# Patient Record
Sex: Female | Born: 1970
Health system: Southern US, Community
[De-identification: ages and names within clinical notes are randomized; demographics above are authoritative.]

## PROBLEM LIST (undated history)

## (undated) DIAGNOSIS — F419 Anxiety disorder, unspecified: Secondary | ICD-10-CM

## (undated) DIAGNOSIS — L509 Urticaria, unspecified: Secondary | ICD-10-CM

## (undated) DIAGNOSIS — J309 Allergic rhinitis, unspecified: Secondary | ICD-10-CM

## (undated) DIAGNOSIS — F32A Depression, unspecified: Secondary | ICD-10-CM

## (undated) DIAGNOSIS — Z85828 Personal history of other malignant neoplasm of skin: Secondary | ICD-10-CM

## (undated) DIAGNOSIS — F329 Major depressive disorder, single episode, unspecified: Secondary | ICD-10-CM

## (undated) DIAGNOSIS — Z8669 Personal history of other diseases of the nervous system and sense organs: Secondary | ICD-10-CM

## (undated) HISTORY — DX: Allergic rhinitis, unspecified: J30.9

## (undated) HISTORY — DX: Personal history of other diseases of the nervous system and sense organs: Z86.69

## (undated) HISTORY — DX: Depression, unspecified: F32.A

## (undated) HISTORY — DX: Personal history of other malignant neoplasm of skin: Z85.828

## (undated) HISTORY — PX: OTHER SURGICAL HISTORY: SHX169

## (undated) HISTORY — PX: TOTAL ABDOMINAL HYSTERECTOMY W/ BILATERAL SALPINGOOPHORECTOMY: SHX83

## (undated) HISTORY — DX: Major depressive disorder, single episode, unspecified: F32.9

## (undated) HISTORY — DX: Urticaria, unspecified: L50.9

## (undated) HISTORY — DX: Anxiety disorder, unspecified: F41.9

## (undated) HISTORY — PX: UMBILICAL HERNIA REPAIR: SUR1181

---

## 2005-04-18 ENCOUNTER — Ambulatory Visit: Payer: Self-pay | Admitting: Obstetrics and Gynecology

## 2005-06-03 ENCOUNTER — Ambulatory Visit: Payer: Self-pay | Admitting: Obstetrics and Gynecology

## 2005-10-22 ENCOUNTER — Ambulatory Visit: Payer: Self-pay | Admitting: Obstetrics and Gynecology

## 2005-12-03 ENCOUNTER — Ambulatory Visit: Payer: Self-pay | Admitting: Obstetrics and Gynecology

## 2006-01-03 ENCOUNTER — Inpatient Hospital Stay: Payer: Self-pay | Admitting: Obstetrics and Gynecology

## 2006-01-06 ENCOUNTER — Ambulatory Visit: Payer: Self-pay | Admitting: Pediatrics

## 2006-04-07 HISTORY — PX: OTHER SURGICAL HISTORY: SHX169

## 2007-03-23 ENCOUNTER — Ambulatory Visit: Payer: Self-pay | Admitting: Surgery

## 2007-03-29 ENCOUNTER — Ambulatory Visit: Payer: Self-pay | Admitting: Surgery

## 2007-04-06 ENCOUNTER — Ambulatory Visit: Payer: Self-pay | Admitting: Surgery

## 2007-04-08 ENCOUNTER — Ambulatory Visit: Payer: Self-pay | Admitting: Internal Medicine

## 2007-04-28 ENCOUNTER — Ambulatory Visit: Payer: Self-pay | Admitting: Internal Medicine

## 2007-05-04 ENCOUNTER — Ambulatory Visit: Payer: Self-pay | Admitting: Internal Medicine

## 2007-05-09 ENCOUNTER — Ambulatory Visit: Payer: Self-pay | Admitting: Internal Medicine

## 2007-06-04 ENCOUNTER — Ambulatory Visit: Payer: Self-pay | Admitting: Surgery

## 2007-06-06 ENCOUNTER — Ambulatory Visit: Payer: Self-pay | Admitting: Internal Medicine

## 2007-07-08 ENCOUNTER — Emergency Department: Payer: Self-pay | Admitting: Emergency Medicine

## 2007-09-06 ENCOUNTER — Ambulatory Visit: Payer: Self-pay | Admitting: Internal Medicine

## 2007-11-02 ENCOUNTER — Ambulatory Visit: Payer: Self-pay

## 2008-05-30 ENCOUNTER — Inpatient Hospital Stay: Payer: Self-pay | Admitting: Internal Medicine

## 2008-08-07 ENCOUNTER — Emergency Department: Payer: Self-pay | Admitting: Emergency Medicine

## 2008-08-29 ENCOUNTER — Ambulatory Visit: Payer: Self-pay | Admitting: Surgery

## 2008-09-06 ENCOUNTER — Ambulatory Visit: Payer: Self-pay | Admitting: Surgery

## 2009-10-25 ENCOUNTER — Ambulatory Visit: Payer: Self-pay | Admitting: Family

## 2010-08-13 ENCOUNTER — Observation Stay: Payer: Self-pay | Admitting: Internal Medicine

## 2010-08-23 ENCOUNTER — Emergency Department: Payer: Self-pay | Admitting: Emergency Medicine

## 2010-09-07 ENCOUNTER — Emergency Department: Payer: Self-pay | Admitting: Emergency Medicine

## 2011-04-08 HISTORY — PX: BREAST CYST ASPIRATION: SHX578

## 2011-05-21 ENCOUNTER — Emergency Department: Payer: Self-pay | Admitting: Emergency Medicine

## 2011-05-21 LAB — CBC
HCT: 37.6 % (ref 35.0–47.0)
MCH: 30.1 pg (ref 26.0–34.0)
MCHC: 33.8 g/dL (ref 32.0–36.0)
RDW: 12.8 % (ref 11.5–14.5)

## 2011-05-21 LAB — BASIC METABOLIC PANEL
BUN: 18 mg/dL (ref 7–18)
Calcium, Total: 8.6 mg/dL (ref 8.5–10.1)
Chloride: 105 mmol/L (ref 98–107)
Co2: 23 mmol/L (ref 21–32)
EGFR (Non-African Amer.): >60
Potassium: 3.5 mmol/L (ref 3.5–5.1)

## 2011-08-04 ENCOUNTER — Other Ambulatory Visit: Payer: Self-pay | Admitting: Physician Assistant

## 2011-08-04 LAB — COMPREHENSIVE METABOLIC PANEL
Albumin: 3.6 g/dL (ref 3.4–5.0)
Alkaline Phosphatase: 47 U/L — ABNORMAL LOW (ref 50–136)
Bilirubin,Total: 0.5 mg/dL (ref 0.2–1.0)
Calcium, Total: 8.6 mg/dL (ref 8.5–10.1)
Chloride: 107 mmol/L (ref 98–107)
Co2: 28 mmol/L (ref 21–32)
Creatinine: 0.67 mg/dL (ref 0.60–1.30)
EGFR (African American): >60
Glucose: 85 mg/dL (ref 65–99)
Osmolality: 281 (ref 275–301)
Potassium: 3.6 mmol/L (ref 3.5–5.1)
SGPT (ALT): 22 U/L
Total Protein: 7.5 g/dL (ref 6.4–8.2)

## 2011-08-04 LAB — CBC WITH DIFFERENTIAL/PLATELET
Basophil #: 0 10*3/uL (ref 0.0–0.1)
Basophil %: 0.7 %
HCT: 39.6 % (ref 35.0–47.0)
HGB: 13 g/dL (ref 12.0–16.0)
Lymphocyte #: 2 10*3/uL (ref 1.0–3.6)
Lymphocyte %: 37.4 %
MCHC: 32.7 g/dL (ref 32.0–36.0)
Monocyte #: 0.3 x10 3/mm (ref 0.2–0.9)
Monocyte %: 6 %
Neutrophil #: 2.7 10*3/uL (ref 1.4–6.5)
Neutrophil %: 50.3 %
Platelet: 185 10*3/uL (ref 150–440)
RDW: 13.4 % (ref 11.5–14.5)
WBC: 5.4 10*3/uL (ref 3.6–11.0)

## 2011-08-04 LAB — TSH: Thyroid Stimulating Horm: 3.19 u[IU]/mL

## 2012-02-12 ENCOUNTER — Ambulatory Visit: Payer: Self-pay

## 2012-02-18 ENCOUNTER — Emergency Department: Payer: Self-pay | Admitting: Emergency Medicine

## 2012-03-23 ENCOUNTER — Ambulatory Visit: Payer: Self-pay | Admitting: Primary Care

## 2012-04-07 HISTORY — PX: TOTAL ABDOMINAL HYSTERECTOMY W/ BILATERAL SALPINGOOPHORECTOMY: SHX83

## 2012-04-07 HISTORY — PX: ABDOMINAL HYSTERECTOMY: SHX81

## 2012-04-07 HISTORY — PX: HERNIA REPAIR: SHX51

## 2012-10-12 ENCOUNTER — Ambulatory Visit: Payer: Self-pay | Admitting: Surgery

## 2012-10-12 LAB — BASIC METABOLIC PANEL
Anion Gap: 5 — ABNORMAL LOW (ref 7–16)
Calcium, Total: 9.3 mg/dL (ref 8.5–10.1)
Chloride: 107 mmol/L (ref 98–107)
Co2: 29 mmol/L (ref 21–32)
Creatinine: 0.62 mg/dL (ref 0.60–1.30)
EGFR (African American): >60
Osmolality: 280 (ref 275–301)
Potassium: 3.9 mmol/L (ref 3.5–5.1)
Sodium: 141 mmol/L (ref 136–145)

## 2012-10-12 LAB — PREGNANCY, URINE: Pregnancy Test, Urine: NEGATIVE m[IU]/mL

## 2012-10-18 ENCOUNTER — Ambulatory Visit: Payer: Self-pay | Admitting: Obstetrics and Gynecology

## 2012-10-19 LAB — BASIC METABOLIC PANEL
Anion Gap: 5 — ABNORMAL LOW (ref 7–16)
BUN: 9 mg/dL (ref 7–18)
Calcium, Total: 8.1 mg/dL — ABNORMAL LOW (ref 8.5–10.1)
Co2: 27 mmol/L (ref 21–32)
Creatinine: 0.56 mg/dL — ABNORMAL LOW (ref 0.60–1.30)
EGFR (African American): >60
Glucose: 110 mg/dL — ABNORMAL HIGH (ref 65–99)
Osmolality: 271 (ref 275–301)
Potassium: 3.6 mmol/L (ref 3.5–5.1)
Sodium: 136 mmol/L (ref 136–145)

## 2013-08-22 ENCOUNTER — Emergency Department: Payer: Self-pay | Admitting: Emergency Medicine

## 2013-08-22 LAB — CK TOTAL AND CKMB (NOT AT ARMC): CK, Total: 122 U/L

## 2013-08-22 LAB — COMPREHENSIVE METABOLIC PANEL
ALK PHOS: 52 U/L
ALT: 64 U/L (ref 12–78)
Albumin: 4.3 g/dL (ref 3.4–5.0)
Anion Gap: 8 (ref 7–16)
BUN: 6 mg/dL — ABNORMAL LOW (ref 7–18)
Bilirubin,Total: 0.7 mg/dL (ref 0.2–1.0)
CHLORIDE: 106 mmol/L (ref 98–107)
Calcium, Total: 9.5 mg/dL (ref 8.5–10.1)
Co2: 24 mmol/L (ref 21–32)
Creatinine: 0.62 mg/dL (ref 0.60–1.30)
Glucose: 97 mg/dL (ref 65–99)
OSMOLALITY: 273 (ref 275–301)
Potassium: 3.3 mmol/L — ABNORMAL LOW (ref 3.5–5.1)
SGOT(AST): 25 U/L (ref 15–37)
Sodium: 138 mmol/L (ref 136–145)
Total Protein: 8.8 g/dL — ABNORMAL HIGH (ref 6.4–8.2)

## 2013-08-22 LAB — CBC
HCT: 45.6 % (ref 35.0–47.0)
HGB: 15.1 g/dL (ref 12.0–16.0)
MCH: 30.2 pg (ref 26.0–34.0)
MCHC: 33.2 g/dL (ref 32.0–36.0)
MCV: 91 fL (ref 80–100)
Platelet: 224 10*3/uL (ref 150–440)
RBC: 5.01 10*6/uL (ref 3.80–5.20)
RDW: 12.7 % (ref 11.5–14.5)
WBC: 6 10*3/uL (ref 3.6–11.0)

## 2013-08-22 LAB — TROPONIN I: Troponin-I: <0.02 ng/mL

## 2014-05-22 ENCOUNTER — Ambulatory Visit: Payer: Self-pay | Admitting: Obstetrics and Gynecology

## 2014-07-28 NOTE — Op Note (Signed)
PATIENT NAME:  Karen Walker, Karen Walker MR#:  [id number] DATE OF BIRTH:  [birth year]  DATE OF PROCEDURE:  10/18/2012  PREOPERATIVE DIAGNOSIS: Umbilical hernia.   POSTOPERATIVE DIAGNOSIS: Umbilical hernia.   PROCEDURE: Umbilical hernia repair.   SURGEON: Rochel Brome, MD  ANESTHESIA: General.   INDICATIONS: This 44 year old female has history of bulging at the umbilicus, has had some history of mild pain at the umbilicus, and a small umbilical hernia was demonstrated on physical exam which appeared to be the likely cause of the pain. She also had plans for laparoscopic-assisted vaginal hysterectomy, which was performer Dr. Ouida Sills.   DESCRIPTION OF PROCEDURE: With the patient on the operating table, in the lithotomy position, under general anesthesia, after completion of her hysterectomy, the incision remained open in the infraumbilical area. This was at the site of an old scar and was oriented transversely. This incision was lengthened by about 4 mm on the left end.  Dissection was carried down with blunt and sharp dissection exposing an umbilical hernia with herniated properitoneal fat. There were some lobulations in the herniated properitoneal fat, which was dissected free from surrounding structures and dissected down to the fascial ring defect. The herniated properitoneal fat was dissected free from the fascial ring defect and was reduced. The fascial ring defect was approximately 8 to 9 mm in dimension. This was repaired with a transversely oriented suture line of interrupted 0 Surgilon figure-of-eight sutures. Following this I could see hemostasis was intact. The portion of the skin of the umbilicus remained tethered to the fascia and an additional 5-0 Monocryl suture was used to tether the inferior aspect of the dermis to the deep fascia. Next, the skin was closed with running 5-0 Monocryl, subcuticular suture, and Dermabond. It is also noted that the 2 laparoscopic port sites and the bilateral groins  were also treated with Dermabond.   The patient appeared to be in satisfactory condition and was then prepared for transfer to the recovery room. ____________________________ Lenna Sciara. Rochel Brome, MD jws:sb D: 10/18/2012 10:01:17 ET T: 10/18/2012 10:35:33 ET JOB#: [id number]  cc: Loreli Dollar, MD, <Dictator> Loreli Dollar MD ELECTRONICALLY SIGNED 10/19/2012 18:00

## 2014-07-28 NOTE — Op Note (Signed)
PATIENT NAME:  Karen Walker, Karen Walker MR#:  [id number] DATE OF BIRTH:  1970/11/10  DATE OF PROCEDURE:  10/18/2012  PREOPERATIVE DIAGNOSES:  1.  Chronic pelvic pain.  2.  History of endometriosis.  3.  Umbilical hernia.  POSTOPERATIVE DIAGNOSES: 1.  Chronic pelvic pain.  2.  History of endometriosis.  3.  Umbilical hernia.  PROCEDURE:  1.  Laparoscopic-assisted vaginal hysterectomy and left salpingo-oophorectomy.  2.  Umbilical hernia repair. Please reference Dr. Lavone Neri Smith's operative report.   SURGEON: Boykin Nearing, M.D.   FIRST ASSISTANT: Amalia Hailey.   ANESTHESIA: General endotracheal anesthesia.   INDICATIONS: This is a 44 year old female with chronic pelvic pain and dyspareunia. The patient is known to have endometriosis as documented on previous laparoscopy. The patient is also noted to have umbilical hernia.   PROCEDURE: After adequate general endotracheal anesthesia, the patient was placed in the dorsal supine position with legs in the Wilkinson stirrups. The patient's abdomen, perineum, and vagina were prepped in a  normal sterile fashion. The patient did receive 2 grams IV cefoxitin prior to commencement of the case. The patient's bladder was catheterized with a Foley catheter yielding clear urine and double-tooth tenaculum applied on the cervix to be used for uterine manipulation during the procedure. Gloves were changed. A 12 mm infraumbilical incision was made, followed by placement of the laparoscope into the abdominal cavity under direct visualization with the Optiview cannula. The patient's abdomen was insufflated with carbon dioxide. A second 11 mm port was placed in the left lower quadrant under direct visualization. Trocar was advanced into the abdominal cavity. A third port site, 5 mm, was advanced into the abdominal cavity in the right lower quadrant under direct visualization. The left round ligament was transected with the Harmonic scalpel followed by cauterization of the left  infundibulopelvic ligament followed by removal of the left tube and ovary with the Harmonic scalpel. The broad ligament was clamped and transected with the Harmonic scalpel and dissection continued to the left uterine artery, which was cauterized and transected with the Harmonic scalpel. The patient's right round ligament was transected with the Harmonic scalpel and the broad ligament was sequentially opened. The right uterine artery was identified, cauterized, and transected with the Harmonic scalpel. Normal ureteral function was noted to this point.   The procedure was then converted to the vaginal approach where the cervix was circumferentially injected with Pitressin. A direct posterior colpotomy incision was made. Upon entry into the posterior cul-de-sac, the uterosacral ligaments were bilaterally clamped, transected, and suture ligated with 0 Vicryl suture. Circumferential incision was made with the Bovie and the anterior cul-de-sac was entered without difficulty. Two separate cardinal ligament clamps were placed with transection and suture ligated with 0 Vicryl suture. The uterus and left tube and ovary were delivered without difficulty. The peritoneum was then closed with a 2-0 PDS suture in a pursestring fashion and the vaginal vault was then closed with 0 Vicryl suture in a running nonlocking suture. Good hemostasis was noted. The patient's abdomen was then re-insufflated with CO2. Good hemostasis noted from within. Normal peristaltic activity of the ureters were noted. The lower port sites were then both closed with 2-0 and 4-0 Vicryl suture. Dr. Rochel Brome then commenced with the umbilical hernia repair. Please reference his operative report.   ESTIMATED BLOOD LOSS: 100 mL.    INTRAOPERATIVE FLUIDS: 1000 mL.   PATIENT NAME: Karen Walker thank you   ____________________________ Boykin Nearing, MD tjs:aw D: 10/18/2012 09:37:33 ET T: 10/18/2012 10:02:32 ET  JOB#: [id number]  cc: Boykin Nearing, MD, <Dictator> Boykin Nearing MD ELECTRONICALLY SIGNED 10/18/2012 10:37

## 2014-08-04 ENCOUNTER — Ambulatory Visit
Admit: 2014-08-04 | Disposition: A | Discharge status: Home / Self Care | Payer: Self-pay | Attending: Family Medicine | Admitting: Family Medicine

## 2014-08-11 ENCOUNTER — Ambulatory Visit: Payer: 59 | Admitting: Physical Therapy

## 2014-08-11 ENCOUNTER — Encounter: Payer: Self-pay | Admitting: Physical Therapy

## 2014-08-11 DIAGNOSIS — R293 Abnormal posture: Secondary | ICD-10-CM | POA: Insufficient documentation

## 2014-08-11 DIAGNOSIS — M629 Disorder of muscle, unspecified: Secondary | ICD-10-CM

## 2014-08-11 DIAGNOSIS — R278 Other lack of coordination: Secondary | ICD-10-CM

## 2014-08-11 DIAGNOSIS — M549 Dorsalgia, unspecified: Secondary | ICD-10-CM | POA: Insufficient documentation

## 2014-08-12 NOTE — Therapy (Deleted)
[place] MAIN [facility] SERVICES [address] [place], [place], [address] Phone: [phone]   Fax:  [phone]  Physical Therapy Evaluation  Patient Details  Name: Karen Walker MRN: [id number] Date of Birth: [birth year] Referring Provider:  Arnetha Courser, MD  Encounter Date: 08/11/2014      PT End of Session - 08/16/14 0834    Visit Number 2   Number of Visits 12   Date for PT Re-Evaluation 11/03/14   PT Start Time 3009   PT Stop Time 1730   PT Time Calculation (min) 53 min   Activity Tolerance Patient tolerated treatment well;No increased pain   Behavior During Therapy [facility] for tasks assessed/performed      Past Medical History  Diagnosis Date  . Anxiety   . Depression   . Allergy   . Hx of migraine headaches     light sensivity, unilateral HA    Past Surgical History  Procedure Laterality Date  . Melonoma removal on r medial arch of foot    . Hysterectemy    . Umbilical hernia repair      occurred with hysterectemy  . Hernia repair    . Abdominal hysterectomy      There were no vitals filed for this visit.  Visit Diagnosis:  Coordination abnormal - Plan: PT plan of care cert/re-cert,  Posture abnormality - Plan: PT plan of care cert/re-cert,   Fascial defect - Plan: PT plan of care cert/re-cert,      Subjective Assessment - 08/16/14 0825    Subjective Pt reported radiating pain from her LBP has resolved since the last session. Pt now states she experiences R upper trap pain.    Patient is accompained by: Family member  son   Pertinent History surgery for CA melanoma on medial arch R LE 2008, hysterectemy and  umbilical hernia repair 2330   Limitations Other (comment);Lifting   Patient Stated Goals decrease pain   Currently in Pain? Yes   Pain Score 9    Pain Location Neck   Pain Orientation Right   Pain Descriptors / Indicators Aching   Pain Onset Yesterday            Treatment: Manual Tx: gapping,  rotational mob in L sidelying at L1-2 Grade III. Centralized radicular Sx from LE but caused radiating pain upward to medial scap on R. Applied sustained pressure along paraspinals, STM medial scap mm, and MWM with open book 15 reps to R. Pt reported centralization of Sx post-Tx and was able to perform sidebend [facility] without pain.                    PT Education - 08/16/14 [phone]    Education provided Yes   Education Details HEP and posture   Person(s) Educated Patient   Methods Explanation;Demonstration;Tactile cues;Verbal cues;Handout   Comprehension Verbalized understanding;Returned demonstration;Verbal cues required;Tactile cues required          PT Short Term Goals - 08/12/14 1027    PT SHORT TERM GOAL #1   Title Pt will be IND with abdominal massage to decrease scar restrictions and progress to deep core strengthening HEP.    Time 6   Period Weeks   Status New   PT SHORT TERM GOAL #2   Title Pt will be IND with ROM HEP and strengthening HEP in order to decrease pain to sleep and work.    Time 6   Period Weeks   Status  New           PT Long Term Goals - 08/11/14 1136    PT LONG TERM GOAL #1   Title Pt wlll report pain starting after 3pm of working in order to show improved self-management and use of proper body mechanics and perform work duties.   Time 12   Period Weeks   Status New   PT LONG TERM GOAL #2   Title Pt will report sleeping with <2x of interruptions by pain in order to improve sleep.   Time 12   Period Weeks   Status New   PT LONG TERM GOAL #3   Title Pt will score a decrease in ODI  from 36% to < 20% in order to return to ADLs and work duties without pain.    Time 12   Period Weeks   Status New               Plan - 08/16/14 0916    Clinical Impression Statement Pt is a 44 yo female with LBP that radiates to RLE and neck.    Pt will benefit from skilled therapeutic intervention in order to improve on the following deficits  Impaired flexibility;Improper body mechanics;Decreased range of motion;Pain;Decreased activity tolerance;Increased fascial restricitons;Increased muscle spasms;Decreased scar mobility;Decreased endurance;Decreased safety awareness;Decreased mobility;Decreased strength;Decreased knowledge of precautions;Decreased coordination   Rehab Potential Good   PT Frequency 2x / week   PT Treatment/Interventions ADLs/Self Care Home Management;Moist Heat;Therapeutic activities;Patient/family education;Scar mobilization;Aquatic Therapy;Therapeutic exercise;Passive range of motion;Balance training;Manual techniques;Dry needling;Cryotherapy;Stair training;Neuromuscular re-education;Energy conservation;Functional mobility training   PT Next Visit Plan reassess Sx, ROM and assess muscle flexibilty in posterior back   Consulted and Agree with Plan of Care Patient         Problem List There are no active problems to display for this patient.   [name] ,PT, DPT, E-RYT  08/16/2014, 9:17 AM  [place] MAIN [facility] SERVICES [address] [place], [place], [address] Phone: [phone]   Fax:  [phone]

## 2014-08-12 NOTE — Patient Instructions (Addendum)
__Handout on open book only to R (15x) reps 2x day. FOr open book and side sleeping: Pillow folded under head for sidelying to decrease neck spasms. Pillow between knees for alignment. Practice log rolling technique, proper body mechanics at work (twist body by moving feet first, pushing cart with COM close to cart w/ decreased spinal flexion). __donning SIJ belt and wearing at work.

## 2014-08-14 ENCOUNTER — Ambulatory Visit: Payer: Self-pay | Admitting: Physical Therapy

## 2014-08-15 ENCOUNTER — Ambulatory Visit: Payer: 59 | Admitting: Physical Therapy

## 2014-08-15 DIAGNOSIS — R278 Other lack of coordination: Secondary | ICD-10-CM

## 2014-08-15 DIAGNOSIS — M549 Dorsalgia, unspecified: Secondary | ICD-10-CM | POA: Diagnosis not present

## 2014-08-15 DIAGNOSIS — M629 Disorder of muscle, unspecified: Secondary | ICD-10-CM

## 2014-08-15 DIAGNOSIS — R293 Abnormal posture: Secondary | ICD-10-CM

## 2014-08-16 ENCOUNTER — Encounter: Payer: Self-pay | Admitting: Physical Therapy

## 2014-08-16 NOTE — Patient Instructions (Signed)
There Ex: pect stretch, scap and cervical retraction isometrics.      Neuro-muscular re-edu on posture, impact on pain and mm tensions.

## 2014-08-16 NOTE — Therapy (Signed)
[place] MAIN [facility] SERVICES [address] [place], [place], [address] Phone: [phone]   Fax:  [phone]  Physical Therapy Treatment  Patient Details  Name: Karen Walker MRN: [id number] Date of Birth: [birth year] Referring Provider:  Arnetha Courser, MD  Encounter Date: 08/15/2014    Past Medical History  Diagnosis Date  . Anxiety   . Depression   . Allergy   . Hx of migraine headaches     light sensivity, unilateral HA    Past Surgical History  Procedure Laterality Date  . Melonoma removal on r medial arch of foot    . Hysterectemy    . Umbilical hernia repair      occurred with hysterectemy  . Hernia repair    . Abdominal hysterectomy      There were no vitals filed for this visit.  Visit Diagnosis:  Coordination abnormal  Posture abnormality  Fascial defect      Subjective Assessment - 08/16/14 0825    Subjective Pt reported radiating pain from her LBP has resolved since the last session. Pt now states she experiences R upper trap pain.    Patient is accompained by: Family member  son   Pertinent History surgery for CA melanoma on medial arch R LE 2008, hysterectemy and  umbilical hernia repair 6803   Limitations Other (comment);Lifting   Patient Stated Goals decrease pain   Currently in Pain? Yes   Pain Score 9    Pain Location Neck   Pain Orientation Right   Pain Descriptors / Indicators Aching   Pain Onset Yesterday      O: Pre-Tx: Limited R cervical rotation ~35deg R, ~45 deg L. And pain reported with R sidebend, pain 10/10     Tx: light manual Tx with occipital release, cranium and sacrum. STM and stretching onR upper Trap, levator.      Post-Tx: noted bil ROM rotation ~45 deg and sidebend without pain, pain reported 0/10 and feeling "lighter".      There Ex: pect stretch and up trap/lev stretch by wall 5 breaths each side, scap and cervical retraction isometrics 5 sec holds in supine and seated.   Neuro-muscular re-edu on posture, impact on pain and mm tensions.                               PT Short Term Goals - 08/12/14 1027    PT SHORT TERM GOAL #1   Title Pt will be IND with abdominal massage to decrease scar restrictions and progress to deep core strengthening HEP.    Time 6   Period Weeks   Status New   PT SHORT TERM GOAL #2   Title Pt will be IND with ROM HEP and strengthening HEP in order to decrease pain to sleep and work.    Time 6   Period Weeks   Status New           PT Long Term Goals - 08/11/14 1136    PT LONG TERM GOAL #1   Title Pt wlll report pain starting after 3pm of working in order to show improved self-management and use of proper body mechanics and perform work duties.   Time 12   Period Weeks   Status New   PT LONG TERM GOAL #2   Title Pt will report sleeping with <2x of interruptions by pain in order to improve sleep.   Time  12   Period Weeks   Status New   PT LONG TERM GOAL #3   Title Pt will score a decrease in ODI  from 36% to < 20% in order to return to ADLs and work duties without pain.    Time 12   Period Weeks   Status New               Problem List There are no active problems to display for this patient.   [name] ,PT, DPT, E-RYT  08/16/2014, 8:28 AM  [place] MAIN [facility] SERVICES [address] [place], [place], [address] Phone: [phone]   Fax:  [phone]

## 2014-08-17 ENCOUNTER — Encounter: Payer: 59 | Admitting: Physical Therapy

## 2014-08-17 ENCOUNTER — Ambulatory Visit: Payer: 59 | Attending: Family Medicine | Admitting: Physical Therapy

## 2014-08-17 DIAGNOSIS — R293 Abnormal posture: Secondary | ICD-10-CM

## 2014-08-17 DIAGNOSIS — R278 Other lack of coordination: Secondary | ICD-10-CM

## 2014-08-17 DIAGNOSIS — M629 Disorder of muscle, unspecified: Secondary | ICD-10-CM

## 2014-08-17 DIAGNOSIS — M549 Dorsalgia, unspecified: Secondary | ICD-10-CM | POA: Diagnosis not present

## 2014-08-18 NOTE — Therapy (Signed)
[place] MAIN [facility] SERVICES [address] [place], [place], [address] Phone: [phone]   Fax:  [phone]  Physical Therapy Treatment  Patient Details  Name: Karen Walker MRN: [id number] Date of Birth: 18-Jul-1970 Referring Provider:  Arnetha Courser, MD  Encounter Date: 08/17/2014      PT End of Session - 08/18/14 1928    Visit Number 3   Number of Visits 12   Date for PT Re-Evaluation 11/03/14   PT Start Time 1440   PT Stop Time 1535   PT Time Calculation (min) 55 min   Activity Tolerance Patient tolerated treatment well;No increased pain   Behavior During Therapy [facility] for tasks assessed/performed      Past Medical History  Diagnosis Date  . Anxiety   . Depression   . Allergy   . Hx of migraine headaches     light sensivity, unilateral HA    Past Surgical History  Procedure Laterality Date  . Melonoma removal on r medial arch of foot    . Hysterectemy    . Umbilical hernia repair      occurred with hysterectemy  . Hernia repair    . Abdominal hysterectomy      There were no vitals filed for this visit.  Visit Diagnosis:  Coordination abnormal  Posture abnormality  Fascial defect      Subjective Assessment - 08/17/14 1530    Subjective Pt reported radiating pain from her LBP has improved by 50%.  Pt now states she experiences R upper trap pain was minimal this morning but upon leaving work, it is 10/10. Pt stated she has been able to sleep on her R side for 2 nights and was awakened by pain only once.    Patient is accompained by: Family member  son   Pertinent History surgery for CA melanoma on medial arch R LE 2008, hysterectemy and  umbilical hernia repair 1157   Limitations Other (comment);Lifting   Patient Stated Goals decrease pain   Pain Score 10-Worst pain ever   Pain Location Neck   Pain Orientation Right   Pain Descriptors / Indicators Other (Comment)  like a "knot"   Pain Radiating Towards up to  back of head   Pain Onset Efrain Sella Adult PT Treatment/Exercise - 08/18/14 1908    Exercises   Exercises Neck;Shoulder;Other Exercises   Other Exercises  guided pt with self colon massage 10 x. attempted to teach pt low cobra for t-spine ext but pt demo'd difficulty. modified to standing with tactile cue ribcage/pelvis alignment during shoulder depression/retraction on exhalation. pt demo'd correctly.  guided pt with scar massage transverse friction   Neck Exercises: Theraband   Shoulder ADduction --  AROM in supine   Neck Exercises: Standing   Other Standing Exercises trap stretch by wall and supine 5 reps without pain   Neck Exercises: Supine   Shoulder ABduction 5 reps   0-180 deg,  pt reported movement felt good   Manual Therapy   Manual Therapy Joint mobilization;Soft tissue mobilization;Other (comment)   Joint Mobilization Noted radiating pain to elbow R w/ PA on SP on T3  Applied Grade II PA on SP with centralization post-Tx   Soft tissue mobilization sidelying: STM medial scapula w/ glides to increase scap depression. Noted R upper trap trigger points with radiating pain superior  occiput R. applied STM, myofascial release but PT sought expertise of certified PT on rehab team to apply dry needling. Pt denied contraindications and was explained the side effects of dry needling.  Pt consented and reported tingling sensation upon insertion of needle on upper trap trigger point. Pt reported no increased pain during and post-Tx. Pt received moist pack with skin intact after removal (10 min).   also applied scar massage at umbilicus.    Other Manual Therapy                 PT Education - 08/18/14 1927    Education provided Yes   Education Details HEP   Person(s) Educated Patient   Methods Explanation;Demonstration;Tactile cues;Verbal cues   Comprehension Verbalized understanding;Returned demonstration;Verbal cues required;Tactile  cues required          PT Short Term Goals - 08/17/14 1933    PT SHORT TERM GOAL #1   Title Pt will be IND with abdominal massage to decrease scar restrictions and progress to deep core strengthening HEP.    Time 6   Period Weeks   Status On-going   PT SHORT TERM GOAL #2   Title Pt will be IND with ROM HEP and strengthening HEP in order to decrease pain to sleep and work.    Time 6   Period Weeks   Status On-going           PT Long Term Goals - 08/17/14 1934    PT LONG TERM GOAL #1   Title Pt wlll report pain starting after 3pm of working in order to show improved self-management and use of proper body mechanics and perform work duties.   Time 12   Period Weeks   Status On-going   PT LONG TERM GOAL #2   Title Pt will report sleeping with <2x of interruptions by pain in order to improve sleep.   Time 12   Period Weeks   Status Achieved   PT LONG TERM GOAL #3   Title Pt will score a decrease in ODI  from 36% to < 20% in order to return to ADLs and work duties without pain.    Time 12   Period Weeks   Status On-going               Plan - 08/17/14 1530    Clinical Impression Statement Pt has maintained ROM with cervical and spinal movements without limitations. Pt rpeorted no R neck/shoulder pain post manual Tx and dry needling. Pt showed  increasing spinal segment mobility w/ centralizing of Sx from UE. Pt continues to progress towards her goals as she was able to sleep for two nights without interruption of pain. Pt will benefit from skilled PT to progress to strengthening to perform work duties.      Pt will benefit from skilled therapeutic intervention in order to improve on the following deficits Impaired flexibility;Improper body mechanics;Decreased range of motion;Pain;Decreased activity tolerance;Increased fascial restricitons;Increased muscle spasms;Decreased scar mobility;Decreased endurance;Decreased safety awareness;Decreased mobility;Decreased  strength;Decreased knowledge of precautions;Decreased coordination   Rehab Potential Good   PT Frequency 2x / week   PT Treatment/Interventions ADLs/Self Care Home Management;Moist Heat;Therapeutic activities;Patient/family education;Scar mobilization;Aquatic Therapy;Therapeutic exercise;Passive range of motion;Balance training;Manual techniques;Dry needling;Cryotherapy;Stair training;Neuromuscular re-education;Energy conservation;Functional mobility training   PT Next Visit Plan reassess Sx, ROM and assess muscle flexibilty in posterior back   Consulted and Agree with Plan of Care Patient        Problem List There are no active problems  to display for this patient.   [name] ,PT, DPT, E-RYT  08/18/2014, 7:36 PM  [place] MAIN [facility] SERVICES [address] [place], [place], [address] Phone: [phone]   Fax:  [phone]

## 2014-08-18 NOTE — Patient Instructions (Addendum)
Added to HEP: self-massage over scar and AROM shoulder abduction in supine,  trap stretches in supine and wall, shoulder depression, retraction slight t-spine ext with tactile cues for proper pelvic /thorax alignment and deept core activation.

## 2014-08-23 ENCOUNTER — Ambulatory Visit: Payer: 59 | Admitting: Physical Therapy

## 2014-08-25 ENCOUNTER — Ambulatory Visit: Payer: 59 | Admitting: Physical Therapy

## 2014-08-25 ENCOUNTER — Telehealth: Payer: Self-pay | Admitting: Physical Therapy

## 2014-08-29 ENCOUNTER — Ambulatory Visit: Payer: 59 | Admitting: Physical Therapy

## 2014-08-31 ENCOUNTER — Ambulatory Visit: Payer: 59 | Admitting: Physical Therapy

## 2014-09-08 ENCOUNTER — Ambulatory Visit: Payer: 59 | Attending: Family Medicine | Admitting: Physical Therapy

## 2014-09-08 DIAGNOSIS — R293 Abnormal posture: Secondary | ICD-10-CM | POA: Insufficient documentation

## 2014-09-08 DIAGNOSIS — M545 Low back pain: Secondary | ICD-10-CM | POA: Diagnosis not present

## 2014-09-08 DIAGNOSIS — M629 Disorder of muscle, unspecified: Secondary | ICD-10-CM

## 2014-09-08 DIAGNOSIS — R278 Other lack of coordination: Secondary | ICD-10-CM

## 2014-09-09 NOTE — Patient Instructions (Signed)
Abdominal massage (Handout) (Daily)  Feet massage (daily)  Dynamic Stabilization 1 (breathing/ TrA activation) 10 reps   Use of small rolled towel under neck when laying down for exercises Chin tucks 5 sec holds in seated posture

## 2014-09-09 NOTE — Therapy (Signed)
[place] MAIN [facility] SERVICES [address] [place], [place], [address] Phone: [phone]   Fax:  [phone]  Physical Therapy Treatment  Patient Details  Name: Karen Walker MRN: [id number] Date of Birth: Apr 03, 1971 Referring Provider:  Arnetha Courser, MD  Encounter Date: 09/08/2014      PT End of Session - 09/08/14 1448    Visit Number 4   PT Start Time 9924   PT Stop Time 2683   PT Time Calculation (min) 60 min   Activity Tolerance Patient tolerated treatment well;No increased pain   Behavior During Therapy [facility] for tasks assessed/performed      Past Medical History  Diagnosis Date  . Anxiety   . Depression   . Allergy   . Hx of migraine headaches     light sensivity, unilateral HA    Past Surgical History  Procedure Laterality Date  . Melonoma removal on r medial arch of foot    . Hysterectemy    . Umbilical hernia repair      occurred with hysterectemy  . Hernia repair    . Abdominal hysterectomy      There were no vitals filed for this visit.  Visit Diagnosis:  Coordination abnormal  Posture abnormality  Fascial defect      Subjective Assessment - 09/08/14 1449    Subjective Pt reported 50% improvement with her pain which now only occurs 3x across her work week. Pt feels tired and tight mm after work but she performs her HEP. Pt no longer has pain with lifting.    Patient is accompained by: Family member  son   Pertinent History surgery for CA melanoma on medial arch R LE 2008, hysterectemy and  umbilical hernia repair [id number]   Limitations Other (comment);Lifting   Patient Stated Goals decrease pain   Currently in Pain? Yes   Pain Score 4    Pain Location Neck   Pain Onset Yesterday                         [facility] Adult PT Treatment/Exercise - 09/08/14 1445    Posture/Postural Control   Posture Comments propioception training with neck position over shoulders. cervical retraction 3 sec  holds in supine and seated 5 reps   Exercises   Exercises Other Exercises   Other Exercises  Abdominal massage, feet massage, Dynamic Stabilization 1   Modalities   Modalities Moist Heat  cervical region , skin intact after 10 min Tx   Manual Therapy   Manual Therapy Soft tissue mobilization;Myofascial release   Manual therapy comments scar massage and abdominal massage applied   Joint Mobilization L lateral glides on R articular process at C2. Noted improved position and decreased c/o pain post-Tx. Grade II mobs  Improved cervical ROM at C1/2 post-Tx   Soft tissue mobilization applied trigger point release at R occiput with occipital release                 PT Education - 09/09/14 0842    Education Details HEP   Person(s) Educated Patient   Methods Explanation;Demonstration;Tactile cues;Verbal cues;Handout   Comprehension Verbalized understanding;Returned demonstration          PT Short Term Goals - 08/17/14 1933    PT SHORT TERM GOAL #1   Title Pt will be IND with abdominal massage to decrease scar restrictions and progress to deep core strengthening HEP.    Time 6   Period  Weeks   Status On-going   PT SHORT TERM GOAL #2   Title Pt will be IND with ROM HEP and strengthening HEP in order to decrease pain to sleep and work.    Time 6   Period Weeks   Status On-going           PT Long Term Goals - 09/09/14 0849    PT LONG TERM GOAL #1   Title Pt wlll report pain starting after 3pm of working in order to show improved self-management and use of proper body mechanics and perform work duties.   Time 12   Period Weeks   Status Achieved   PT LONG TERM GOAL #2   Title Pt will report sleeping with <2x of interruptions by pain in order to improve sleep.   Time 12   Period Weeks   Status Achieved   PT LONG TERM GOAL #3   Title Pt will score a decrease in ODI  from 36% to < 20% in order to return to ADLs and work duties without pain.    Time 12   Period Weeks    Status On-going               Plan - 09/09/14 5170    Clinical Impression Statement Pt showed good carry over with upright posture, decreased upper cross syndrome and upper neckshoulder tensions. Pt responded well manual Tx with no neck pain post-Tx. Initiated abdominal and scar massage to initiated deep core strengthening at next visit. Addressed her c/o heel pain with feet massage and propioception training. Pt is progressing well towards her goals and will benefit skilled PT to advance to strengthening.    Pt will benefit from skilled therapeutic intervention in order to improve on the following deficits Impaired flexibility;Improper body mechanics;Decreased range of motion;Pain;Decreased activity tolerance;Increased fascial restricitons;Increased muscle spasms;Decreased scar mobility;Decreased endurance;Decreased safety awareness;Decreased mobility;Decreased strength;Decreased knowledge of precautions;Decreased coordination   Rehab Potential Good   PT Frequency 2x / week   PT Treatment/Interventions ADLs/Self Care Home Management;Moist Heat;Therapeutic activities;Patient/family education;Scar mobilization;Aquatic Therapy;Therapeutic exercise;Passive range of motion;Balance training;Manual techniques;Dry needling;Cryotherapy;Stair training;Neuromuscular re-education;Energy conservation;Functional mobility training   PT Next Visit Plan dynamic stabilization   Consulted and Agree with Plan of Care Patient        Problem List There are no active problems to display for this patient.   Karen Walker ,PT, DPT, E-RYT  09/09/2014, 8:51 AM  [place] MAIN [facility] SERVICES [address] [place], [place], [address] Phone: [phone]   Fax:  [phone]

## 2014-09-11 ENCOUNTER — Encounter: Payer: 59 | Admitting: Physical Therapy

## 2014-09-15 ENCOUNTER — Ambulatory Visit: Payer: 59 | Admitting: Physical Therapy

## 2014-09-15 ENCOUNTER — Encounter: Payer: 59 | Admitting: Physical Therapy

## 2014-09-15 DIAGNOSIS — R278 Other lack of coordination: Secondary | ICD-10-CM

## 2014-09-15 DIAGNOSIS — R293 Abnormal posture: Secondary | ICD-10-CM

## 2014-09-15 DIAGNOSIS — M545 Low back pain: Secondary | ICD-10-CM | POA: Diagnosis not present

## 2014-09-15 DIAGNOSIS — M629 Disorder of muscle, unspecified: Secondary | ICD-10-CM

## 2014-09-15 NOTE — Patient Instructions (Signed)
PELVIC FLOOR / KEGEL EXERCISES   Pelvic floor/ Kegel exercises are used to strengthen the muscles in the base of your pelvis that are responsible for supporting your pelvic organs and preventing urine/feces leakage. Based on your therapist's recommendations, they can be performed while standing, sitting, or lying down. Imagine pelvic floor area as a diamond with pelvic landmarks: top =pubic bone, bottom tip=tailbone, sides=sitting bones (ischial tuberosities).    Make yourself aware of this muscle group by using these cues while coordinating your breath:  Inhale, feel pelvic floor diamond area lower like hammock towards your feet and ribcage/belly expanding. Pause. Let the exhale naturally and feel your belly sink, abdominal muscles hugging in around you and you may notice the pelvic diamond draws upward towards your head forming a umbrella shape. Give a squeeze during the exhalation like you are stopping the flow of urine. If you are squeezing the buttock muscles, try to give 50% less effort.   Common Errors:  Breath holding: If you are holding your breath, you may be bearing down against your bladder instead of pulling it up. If you belly bulges up while you are squeezing, you are holding your breath. Be sure to breathe gently in and out while exercising. Counting out loud may help you avoid holding your breath.  Accessory muscle use: You should not see or feel other muscle movement when performing pelvic floor exercises. When done properly, no one can tell that you are performing the exercises. Keep the buttocks, belly and inner thighs relaxed.  Overdoing it: Your muscles can fatigue and stop working for you if you over-exercise. You may actually leak more or feel soreness at the lower abdomen or rectum.  YOUR HOME EXERCISE PROGRAM  LONG HOLDS: Position: on back  Inhale and then exhale. Then squeeze the muscle and count aloud for 10 seconds. Rest with three long breaths. (Be sure to let  belly sink in with exhales and not push outward)  Perform 2 repetitions, 2 sets and 2x times/day= 8 total/day  SHORT HOLDS: Position: on back  Inhale and then exhale. Then squeeze the muscle.  (Be sure to let belly sink in with exhales and not push outward)  Perform 5 repetitions, 5  Times/day or incorporate into open book stretch                       DECREASE DOWNWARD PRESSURE ON  YOUR PELVIC FLOOR, ABDOMINAL, LOW BACK MUSCLES       PRESERVE YOUR PELVIC HEALTH LONG-TERM   ** SQUEEZE pelvic floor BEFORE YOUR SNEEZE, COUGH, LAUGH   ** EXHALE BEFORE YOU RISE AGAINST GRAVITY (lifting, sit to stand, from squat to stand)   ** LOG ROLL OUT OF BED INSTEAD OF CRUNCH/SIT-UP

## 2014-09-15 NOTE — Therapy (Signed)
[place] MAIN [facility] SERVICES [address] [place], [place], [address] Phone: [phone]   Fax:  [phone]  Physical Therapy Treatment  Patient Details  Name: Karen Walker MRN: [id number] Date of Birth: 1971/01/29 Referring Provider:  Arnetha Courser, MD  Encounter Date: 09/15/2014      PT End of Session - 09/15/14 1457    Visit Number 4   Number of Visits 12   Date for PT Re-Evaluation 11/03/14   PT Start Time 2831   PT Stop Time 1500   PT Time Calculation (min) 57 min   Activity Tolerance Patient tolerated treatment well;No increased pain   Behavior During Therapy [facility] for tasks assessed/performed      Past Medical History  Diagnosis Date  . Anxiety   . Depression   . Allergy   . Hx of migraine headaches     light sensivity, unilateral HA    Past Surgical History  Procedure Laterality Date  . Melonoma removal on r medial arch of foot    . Hysterectemy    . Umbilical hernia repair      occurred with hysterectemy  . Hernia repair    . Abdominal hysterectomy      There were no vitals filed for this visit.  Visit Diagnosis:  Posture abnormality  Fascial defect  Coordination abnormal      Subjective Assessment - 09/15/14 1406    Subjective (p) Pt    Patient is accompained by: (p) Family member  son   Pertinent History (p) surgery for CA melanoma on medial arch R LE 2008, hysterectemy and  umbilical hernia repair 5176   Limitations (p) Other (comment);Lifting   Patient Stated Goals (p) decrease pain   Currently in Pain? (p) Yes   Pain Score (p) 4    Pain Onset (p) Yesterday                      Pelvic Floor Special Questions - 09/15/14 1452    Scar Other;none   Scar other lower abdominal scar with significantly increased mobility today   External Palpation tenderness to 3-6, 6-9 o clock    Prolapse Anterior Wall;Posterior Wall  within introitus   Pelvic Floor Internal Exam --  4/10/2/5   Exam Type Vaginal   Palpation tenderness bil obt int   slight tensions/ tenderness. decreased post-Thiele massage   Strength good squeeze, good lift, able to hold agaisnt strong resistance  after thiele massage/cuing for pelvic coordination   Strength # of reps 2   Strength # of seconds 10                   PT Education - 09/15/14 1457    Education provided Yes   Education Details HEP, continue stretches/belly massage and add pelvic floor strengthening    Person(s) Educated Patient   Methods Explanation;Demonstration;Tactile cues;Verbal cues;Handout   Comprehension Verbalized understanding;Returned demonstration          PT Short Term Goals - 08/17/14 1933    PT SHORT TERM GOAL #1   Title Pt will be IND with abdominal massage to decrease scar restrictions and progress to deep core strengthening HEP.    Time 6   Period Weeks   Status On-going   PT SHORT TERM GOAL #2   Title Pt will be IND with ROM HEP and strengthening HEP in order to decrease pain to sleep and work.    Time 6  Period Weeks   Status On-going           PT Long Term Goals - 09/09/14 0849    PT LONG TERM GOAL #1   Title Pt wlll report pain starting after 3pm of working in order to show improved self-management and use of proper body mechanics and perform work duties.   Time 12   Period Weeks   Status Achieved   PT LONG TERM GOAL #2   Title Pt will report sleeping with <2x of interruptions by pain in order to improve sleep.   Time 12   Period Weeks   Status Achieved   PT LONG TERM GOAL #3   Title Pt will score a decrease in ODI  from 36% to < 20% in order to return to ADLs and work duties without pain.    Time 12   Period Weeks   Status On-going               Plan - 09/15/14 1458    Clinical Impression Statement Pt 's abdominal scar mobility significantly improved since last session. Initiated pelvic floor strengthening and pt demo'd correctly post-manual Tx and neuro-reedu. Pt  will continue need skilled PT as she requires strengthening to minimize relapse of Sx . Decreased frequency to 1x/ week since pt  shows IND with pain management and is ready for strengthening.    Pt will benefit from skilled therapeutic intervention in order to improve on the following deficits Impaired flexibility;Improper body mechanics;Decreased range of motion;Pain;Decreased activity tolerance;Increased fascial restricitons;Increased muscle spasms;Decreased scar mobility;Decreased endurance;Decreased safety awareness;Decreased mobility;Decreased strength;Decreased knowledge of precautions;Decreased coordination   Rehab Potential Good   PT Frequency 1x / week   PT Duration 12 weeks   PT Treatment/Interventions ADLs/Self Care Home Management;Moist Heat;Therapeutic activities;Patient/family education;Scar mobilization;Aquatic Therapy;Therapeutic exercise;Passive range of motion;Balance training;Manual techniques;Dry needling;Cryotherapy;Stair training;Neuromuscular re-education;Energy conservation;Functional mobility training   PT Next Visit Plan dynamic stabilization   Consulted and Agree with Plan of Care Patient        Problem List There are no active problems to display for this patient.   [name]  ,PT, DPT, E-RYT  09/15/2014, 3:02 PM  [place] MAIN [facility] SERVICES [address] [place], [place], [address] Phone: [phone]   Fax:  [phone]

## 2014-09-22 ENCOUNTER — Ambulatory Visit: Payer: 59 | Admitting: Physical Therapy

## 2014-09-22 DIAGNOSIS — M629 Disorder of muscle, unspecified: Secondary | ICD-10-CM

## 2014-09-22 DIAGNOSIS — R278 Other lack of coordination: Secondary | ICD-10-CM

## 2014-09-22 DIAGNOSIS — M545 Low back pain: Secondary | ICD-10-CM | POA: Diagnosis not present

## 2014-09-22 DIAGNOSIS — R293 Abnormal posture: Secondary | ICD-10-CM

## 2014-09-22 NOTE — Therapy (Addendum)
[place] MAIN [facility] SERVICES [address] [place], [place], [address] Phone: [phone]   Fax:  [phone]  Addendum to previous encounters   Patient Details  Name: Karen Walker MRN: [id number] Date of Birth: 1971-01-22 Referring Provider:  Arnetha Courser, MD  Encounter Date: 08/11/2014   Corrections:  Under Subjective on Evaluation for 5/6: pt was accompanied by dtr (not son).  Under SUbjective on 5/12, 6/10, 6/20: pt attended sessions alone, ( not accompanied by family).     [name]  ,PT, DPT, E-RYT  09/22/2014, 11:35 AM  [place] MAIN [facility] SERVICES [address] [place], [place], [address] Phone: [phone]   Fax:  [phone]

## 2014-09-22 NOTE — Therapy (Signed)
[place] MAIN [facility] SERVICES [address] [place], [place], [address] Phone: [phone]   Fax:  [phone]  Physical Therapy Treatment  Patient Details  Name: Karen Walker MRN: [id number] Date of Birth: 25-Oct-1970 Referring Provider:  Arnetha Courser, MD  Encounter Date: 09/22/2014      PT End of Session - 09/22/14 1057    Visit Number 5   PT Start Time 4481   PT Stop Time 1050   PT Time Calculation (min) 45 min   Activity Tolerance Patient tolerated treatment well;No increased pain   Behavior During Therapy [facility] for tasks assessed/performed      Past Medical History  Diagnosis Date  . Anxiety   . Depression   . Allergy   . Hx of migraine headaches     light sensivity, unilateral HA    Past Surgical History  Procedure Laterality Date  . Melonoma removal on r medial arch of foot    . Hysterectemy    . Umbilical hernia repair      occurred with hysterectemy  . Hernia repair    . Abdominal hysterectomy      There were no vitals filed for this visit.  Visit Diagnosis:  Posture abnormality  Fascial defect  Coordination abnormal      Subjective Assessment - 09/22/14 1121    Subjective Pt reported having to work two weekends in a row and has felt tired. Today pt only c/o R pain at medial superior border of scapula. Pt's dtrs attended session today.   Patient is accompained by: Family member  dtrs   Pertinent History surgery for CA melanoma on medial arch R LE 2008, hysterectemy and  umbilical hernia repair 8563   Limitations Other (comment);Lifting   Patient Stated Goals decrease pain   Pain Onset Efrain Sella PT Assessment - 09/22/14 1104    Assessment   Medical Diagnosis low back pain   Onset Date/Surgical Date 08/10/13   Prior Therapy no   Precautions   Precautions None   Restrictions   Weight Bearing Restrictions No   Prior Function   Level of Independence Independent with basic ADLs   Observation/Other Assessments   Skin Integrity increased scar mobility   Posture/Postural Control   Posture Comments upright posture without cuing   Palpation   Palpation comment pain only at  area at distal attachment of levator scapulae on R   pain resolved post manual Tx and self-mobilization technique   Bed Mobility   Bed Mobility Supine to Sit  minor cuing for shoulder flexion, able to perform 3 trials                     [facility] Adult PT Treatment/Exercise - 09/22/14 1104    Exercises   Exercises Other Exercises   Other Exercises  demo'd propping feet up, Dyn Stab Level 1-2 w/ pelvic floor awareness 10 reps, lat pull down with deep core yellow band 5 reps/ 2 sets   lat pullown modified to 5 reps 2/2 N/T hands.   Manual Therapy   Soft tissue mobilization Noted tensions and applied STM at distal attachment of levator scapulae R  Pain was resolved post-Tx                PT Education - 09/22/14 1056    Education provided Yes   Education Details HEP   Person(s) Educated Patient   Methods  Explanation;Demonstration;Tactile cues;Verbal cues;Handout   Comprehension Verbalized understanding;Returned demonstration          PT Short Term Goals - 08/17/14 1933    PT SHORT TERM GOAL #1   Title Pt will be IND with abdominal massage to decrease scar restrictions and progress to deep core strengthening HEP.    Time 6   Period Weeks   Status On-going   PT SHORT TERM GOAL #2   Title Pt will be IND with ROM HEP and strengthening HEP in order to decrease pain to sleep and work.    Time 6   Period Weeks   Status On-going           PT Long Term Goals - 09/09/14 0849    PT LONG TERM GOAL #1   Title Pt wlll report pain starting after 3pm of working in order to show improved self-management and use of proper body mechanics and perform work duties.   Time 12   Period Weeks   Status Achieved   PT LONG TERM GOAL #2   Title Pt will report sleeping with <2x of  interruptions by pain in order to improve sleep.   Time 12   Period Weeks   Status Achieved   PT LONG TERM GOAL #3   Title Pt will score a decrease in ODI  from 36% to < 20% in order to return to ADLs and work duties without pain.    Time 12   Period Weeks   Status On-going               Plan - 09/22/14 1112    Clinical Impression Statement Pt is progressing well by demoing upright posture without cuing and is progressing to strengthening. Pt's lcalized posterior pain at mid back was resolved with manual Tx and self-mobilization technique. Pt's dtrs attended session.    Pt will benefit from skilled therapeutic intervention in order to improve on the following deficits Impaired flexibility;Improper body mechanics;Decreased range of motion;Pain;Decreased activity tolerance;Increased fascial restricitons;Increased muscle spasms;Decreased scar mobility;Decreased endurance;Decreased safety awareness;Decreased mobility;Decreased strength;Decreased knowledge of precautions;Decreased coordination   Rehab Potential Good   PT Frequency 1x / week   PT Duration 12 weeks   PT Treatment/Interventions ADLs/Self Care Home Management;Moist Heat;Therapeutic activities;Patient/family education;Scar mobilization;Aquatic Therapy;Therapeutic exercise;Passive range of motion;Balance training;Manual techniques;Dry needling;Cryotherapy;Stair training;Neuromuscular re-education;Energy conservation;Functional mobility training   PT Next Visit Plan dynamic stabilization 3-4, monitor hand N/T with band exercises   Consulted and Agree with Plan of Care Patient        Problem List There are no active problems to display for this patient.   [name] ,PT, DPT, E-RYT  09/22/2014, 11:24 AM  [place] MAIN [facility] SERVICES [address] [place], [place], [address] Phone: [phone]   Fax:  [phone]

## 2014-09-22 NOTE — Patient Instructions (Signed)
1) lat pull down w/ yellow band 10reps, x day 2) midback muscle releases over 2 in block/ book -29min  3) belly massage with feet propped up against wall (better lymph drainage and circulation after standing on feet all day) 4)   You are now ready to begin training the deep core muscles system: diaphragm, transverse abdominis, pelvic floor . These muscles must work together as a team.           The key to these exercises to train the brain to coordinate the timing of these muscles and to have them turn on for long periods of time to hold you upright against gravity (especially important if you are on your feet all day).These muscles are postural muscles and play a role stabilizing your spine and bodyweight. By doing these repetitions slowly and correctly instead of doing crunches, you will achieve a flatter belly without a lower pooch. You are also placing your spine in a more neutral position and breathing properly which in turn, decreases your risk for problems related to your pelvic floor, abdominal, and low back such as pelvic organ prolapse, hernias, diastasis recti (separation of superficial muscles), disk herniations, spinal fractures. These exercises set a solid foundation for you to later progress to resistance/ strength training with therabands and weights and return to other typical fitness exercises with a stronger deeper core.   Perform only Level 1 and 2 this week

## 2014-09-22 NOTE — Therapy (Deleted)
[place] MAIN [facility] SERVICES [address] [place], [place], [address] Phone: [phone]   Fax:  [phone]  Physical Therapy Treatment  Patient Details  Name: Karen Walker MRN: [id number] Date of Birth: 09-21-1970 Referring Provider:  Arnetha Courser, MD  Encounter Date: 09/22/2014      PT End of Session - 09/22/14 1057    Visit Number 5   PT Start Time 1884   PT Stop Time 1050   PT Time Calculation (min) 45 min   Activity Tolerance Patient tolerated treatment well;No increased pain   Behavior During Therapy [facility] for tasks assessed/performed      Past Medical History  Diagnosis Date  . Anxiety   . Depression   . Allergy   . Hx of migraine headaches     light sensivity, unilateral HA    Past Surgical History  Procedure Laterality Date  . Melonoma removal on r medial arch of foot    . Hysterectemy    . Umbilical hernia repair      occurred with hysterectemy  . Hernia repair    . Abdominal hysterectomy      There were no vitals filed for this visit.  Visit Diagnosis:  Posture abnormality  Fascial defect  Coordination abnormal      Subjective Assessment - 09/22/14 1121    Subjective Pt reported having to work two weekends in a row and has felt tired. Today pt only c/o pain at R medial superior border of scapula. Pt's dtrs attended session today.   Patient is accompained by: Family member  son   Pertinent History surgery for CA melanoma on medial arch R LE 2008, hysterectemy and  umbilical hernia repair 1660   Limitations Other (comment);Lifting   Patient Stated Goals decrease pain   Pain Onset Efrain Sella PT Assessment - 09/22/14 1104    Assessment   Medical Diagnosis low back pain   Onset Date/Surgical Date 08/10/13   Prior Therapy no   Precautions   Precautions None   Restrictions   Weight Bearing Restrictions No   Prior Function   Level of Independence Independent with basic ADLs   Observation/Other Assessments   Skin Integrity increased scar mobility   Posture/Postural Control   Posture Comments upright posture without cuing   Palpation   Palpation comment pain only at  area at distal attachment of levator scapulae on R   pain resolved post manual Tx and self-mobilization technique   Bed Mobility   Bed Mobility Supine to Sit  minor cuing for shoulder flexion, able to perform 3 trials                     [facility] Adult PT Treatment/Exercise - 09/22/14 1104    Exercises   Exercises Other Exercises   Other Exercises  demo'd propping feet up, Dyn Stab Level 1-2 w/ pelvic floor awareness 10 reps, lat pull down with deep core yellow band 5 reps/ 2 sets   lat pullown modified to 5 reps 2/2 N/T hands.   Manual Therapy   Soft tissue mobilization Noted tensions and applied STM at distal attachment of levator scapulae R  Pain was resolved post-Tx                PT Education - 09/22/14 1056    Education provided Yes   Education Details HEP   Person(s) Educated Patient   Methods  Explanation;Demonstration;Tactile cues;Verbal cues;Handout   Comprehension Verbalized understanding;Returned demonstration          PT Short Term Goals - 08/17/14 1933    PT SHORT TERM GOAL #1   Title Pt will be IND with abdominal massage to decrease scar restrictions and progress to deep core strengthening HEP.    Time 6   Period Weeks   Status On-going   PT SHORT TERM GOAL #2   Title Pt will be IND with ROM HEP and strengthening HEP in order to decrease pain to sleep and work.    Time 6   Period Weeks   Status On-going           PT Long Term Goals - 09/09/14 0849    PT LONG TERM GOAL #1   Title Pt wlll report pain starting after 3pm of working in order to show improved self-management and use of proper body mechanics and perform work duties.   Time 12   Period Weeks   Status Achieved   PT LONG TERM GOAL #2   Title Pt will report sleeping with <2x of  interruptions by pain in order to improve sleep.   Time 12   Period Weeks   Status Achieved   PT LONG TERM GOAL #3   Title Pt will score a decrease in ODI  from 36% to < 20% in order to return to ADLs and work duties without pain.    Time 12   Period Weeks   Status On-going               Plan - 09/22/14 1112    Clinical Impression Statement Pt is progressing well by demoing upright posture without cuing and is progressing to strengthening. Pt's lcalized posterior pain at mid back was resolved with manual Tx and self-mobilization technique. Pt's dtrs attended session.    Pt will benefit from skilled therapeutic intervention in order to improve on the following deficits Impaired flexibility;Improper body mechanics;Decreased range of motion;Pain;Decreased activity tolerance;Increased fascial restricitons;Increased muscle spasms;Decreased scar mobility;Decreased endurance;Decreased safety awareness;Decreased mobility;Decreased strength;Decreased knowledge of precautions;Decreased coordination   Rehab Potential Good   PT Frequency 1x / week   PT Duration 12 weeks   PT Treatment/Interventions ADLs/Self Care Home Management;Moist Heat;Therapeutic activities;Patient/family education;Scar mobilization;Aquatic Therapy;Therapeutic exercise;Passive range of motion;Balance training;Manual techniques;Dry needling;Cryotherapy;Stair training;Neuromuscular re-education;Energy conservation;Functional mobility training   PT Next Visit Plan dynamic stabilization 3-4, monitor hand N/T with band exercises   Consulted and Agree with Plan of Care Patient        Problem List There are no active problems to display for this patient.   [name] ,PT, DPT, E-RYT  09/22/2014, 11:22 AM  [place] MAIN [facility] SERVICES [address] [place], [place], [address] Phone: [phone]   Fax:  [phone]

## 2014-09-25 ENCOUNTER — Ambulatory Visit: Payer: 59 | Admitting: Physical Therapy

## 2014-09-25 DIAGNOSIS — M545 Low back pain: Secondary | ICD-10-CM | POA: Diagnosis not present

## 2014-09-25 DIAGNOSIS — R293 Abnormal posture: Secondary | ICD-10-CM

## 2014-09-25 DIAGNOSIS — M629 Disorder of muscle, unspecified: Secondary | ICD-10-CM

## 2014-09-25 DIAGNOSIS — R278 Other lack of coordination: Secondary | ICD-10-CM

## 2014-09-25 NOTE — Patient Instructions (Signed)
Semi-tandem stance:  Shoveling, sweeping, vacuuming and lifting furniture w/ assistance  Yellow band lat pull down   ______________  Continue dynamic Stabilization 1-2.

## 2014-09-25 NOTE — Therapy (Signed)
[place] MAIN [facility] SERVICES [address] [place], [place], [address] Phone: [phone]   Fax:  [phone]  Physical Therapy Treatment  Patient Details  Name: Karen Walker MRN: [id number] Date of Birth: 04/26/1970 Referring Provider:  Arnetha Courser, MD  Encounter Date: 09/25/2014      PT End of Session - 09/25/14 1347    Visit Number 6   PT Start Time 2774   PT Stop Time 1130   PT Time Calculation (min) 31 min   Activity Tolerance Patient tolerated treatment well;No increased pain   Behavior During Therapy [facility] for tasks assessed/performed      Past Medical History  Diagnosis Date  . Anxiety   . Depression   . Allergy   . Hx of migraine headaches     light sensivity, unilateral HA    Past Surgical History  Procedure Laterality Date  . Melonoma removal on r medial arch of foot    . Hysterectemy    . Umbilical hernia repair      occurred with hysterectemy  . Hernia repair    . Abdominal hysterectomy      There were no vitals filed for this visit.  Visit Diagnosis:  Posture abnormality  Fascial defect  Coordination abnormal      Subjective Assessment - 09/25/14 1057    Subjective Pt had no complaints today. Pt was doing yardwork and had 5/10 in neck/midback yesterday and today. Pt notices she feels stronger and is able to perform more activities at home compared to [facility]    Patient is accompained by: Family member  youngest dtr   Pertinent History surgery for CA melanoma on medial arch R LE 2008, hysterectemy and  umbilical hernia repair 1287   Limitations Other (comment);Lifting   Patient Stated Goals decrease pain   Currently in Pain? Yes   Pain Onset Yesterday                         [facility] Adult PT Treatment/Exercise - 09/25/14 1124    Posture/Postural Control   Posture Comments reviewed Dyn stabilization 1-2 with heat pad under back end of session  10 reps each    Exercises   Exercises  Other Exercises   Other Exercises  semi tandem stance w/ lifting, shoveling, sweeping, vacuuming and lat pull down w/ yellow band   to minimize spinal flexion: 5 reps                 PT Education - 09/25/14 1346    Education provided Yes   Education Details HEP   Person(s) Educated Patient   Methods Explanation;Demonstration;Tactile cues;Verbal cues   Comprehension Verbalized understanding;Returned demonstration          PT Short Term Goals - 08/17/14 1933    PT SHORT TERM GOAL #1   Title Pt will be IND with abdominal massage to decrease scar restrictions and progress to deep core strengthening HEP.    Time 6   Period Weeks   Status On-going   PT SHORT TERM GOAL #2   Title Pt will be IND with ROM HEP and strengthening HEP in order to decrease pain to sleep and work.    Time 6   Period Weeks   Status On-going           PT Long Term Goals - 09/09/14 0849    PT LONG TERM GOAL #1   Title Pt wlll report pain  starting after 3pm of working in order to show improved self-management and use of proper body mechanics and perform work duties.   Time 12   Period Weeks   Status Achieved   PT LONG TERM GOAL #2   Title Pt will report sleeping with <2x of interruptions by pain in order to improve sleep.   Time 12   Period Weeks   Status Achieved   PT LONG TERM GOAL #3   Title Pt will score a decrease in ODI  from 36% to < 20% in order to return to ADLs and work duties without pain.    Time 12   Period Weeks   Status On-going               Plan - 09/25/14 1132    Clinical Impression Statement Pt is showing good carry over with deep core strengthening HEP. 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 Pt is progressing towards her goals.    Pt will benefit from skilled therapeutic intervention in order to improve on the following deficits Impaired flexibility;Improper body mechanics;Decreased range of  motion;Pain;Decreased activity tolerance;Increased fascial restricitons;Increased muscle spasms;Decreased scar mobility;Decreased endurance;Decreased safety awareness;Decreased mobility;Decreased strength;Decreased knowledge of precautions;Decreased coordination   Rehab Potential Good   PT Frequency 1x / week   PT Duration 12 weeks   PT Treatment/Interventions ADLs/Self Care Home Management;Moist Heat;Therapeutic activities;Patient/family education;Scar mobilization;Aquatic Therapy;Therapeutic exercise;Passive range of motion;Balance training;Manual techniques;Dry needling;Cryotherapy;Stair training;Neuromuscular re-education;Energy conservation;Functional mobility training   PT Next Visit Plan dynamic stabilization 3-4, monitor hand N/T with band exercises   Consulted and Agree with Plan of Care Patient        Problem List There are no active problems to display for this patient.   [name]  ,PT, DPT, E-RYT  09/25/2014, 1:52 PM  [place] MAIN [facility] SERVICES [address] [place], [place], [address] Phone: [phone]   Fax:  [phone]

## 2014-10-02 ENCOUNTER — Ambulatory Visit: Payer: 59 | Admitting: Physical Therapy

## 2014-10-02 DIAGNOSIS — R278 Other lack of coordination: Secondary | ICD-10-CM

## 2014-10-02 DIAGNOSIS — R293 Abnormal posture: Secondary | ICD-10-CM

## 2014-10-02 DIAGNOSIS — M629 Disorder of muscle, unspecified: Secondary | ICD-10-CM

## 2014-10-02 DIAGNOSIS — M545 Low back pain: Secondary | ICD-10-CM | POA: Diagnosis not present

## 2014-10-02 NOTE — Therapy (Signed)
[place] MAIN [facility] SERVICES [address] [place], [place], [address] Phone: [phone]   Fax:  [phone]  Physical Therapy Treatment  Patient Details  Name: Karen Walker MRN: [id number] Date of Birth: 1970/07/31 Referring Provider:  Arnetha Courser, MD  Encounter Date: 10/02/2014      PT End of Session - 10/02/14 1601    Visit Number 7   Number of Visits 12   Date for PT Re-Evaluation 11/03/14   PT Start Time 0277   PT Stop Time 4128   PT Time Calculation (min) 44 min   Activity Tolerance Patient tolerated treatment well;No increased pain   Behavior During Therapy [facility] for tasks assessed/performed      Past Medical History  Diagnosis Date  . Anxiety   . Depression   . Allergy   . Hx of migraine headaches     light sensivity, unilateral HA    Past Surgical History  Procedure Laterality Date  . Melonoma removal on r medial arch of foot    . Hysterectemy    . Umbilical hernia repair      occurred with hysterectemy  . Hernia repair    . Abdominal hysterectomy      There were no vitals filed for this visit.  Visit Diagnosis:  Posture abnormality  Coordination abnormal  Fascial defect      Subjective Assessment - 10/02/14 1606    Subjective Pt reported feeling mild ache in her mid back after working today. Pt reported she had felt dizziness after performing her HEP (band exercise and open book) that last about 30 min.    Patient is accompained by: Family member   Pertinent History surgery for CA melanoma on medial arch R LE 2008, hysterectemy and  umbilical hernia repair 7867   Limitations Other (comment);Lifting   Patient Stated Goals decrease pain            [facility] PT Assessment - 10/02/14 1425    Observation/Other Assessments   Skin Integrity significantly increased scar mobility in LQ of abdomen                      [facility] Adult PT Treatment/Exercise - 10/02/14 1425    Posture/Postural  Control   Posture Comments Dynamic Stabilization 3-4 (10 reps bilaterally)   Exercises   Exercises Other Exercises  (yellow band) lat pull down w/ mini squat 10 reps, resisted    Other Exercises  Pt reported dizziness post-ex and at home. BP WNL 92/68.  Told/Demo to pt to pause btw inhale/exhale. No Sx w/ 5 reps                 PT Education - 10/02/14 1558    Education provided Yes   Education Details HEP and to pause between inhale/exhale with HEP in order to prevent dizziness. Stop doing exercises if dizziness continues/ worsens.    Person(s) Educated Patient   Methods Explanation;Demonstration;Tactile cues;Verbal cues;Handout   Comprehension Verbalized understanding;Returned demonstration          PT Short Term Goals - 08/17/14 1933    PT SHORT TERM GOAL #1   Title Pt will be IND with abdominal massage to decrease scar restrictions and progress to deep core strengthening HEP.    Time 6   Period Weeks   Status On-going   PT SHORT TERM GOAL #2   Title Pt will be IND with ROM HEP and strengthening HEP in order to decrease  pain to sleep and work.    Time 6   Period Weeks   Status On-going           PT Long Term Goals - 09/09/14 0849    PT LONG TERM GOAL #1   Title Pt wlll report pain starting after 3pm of working in order to show improved self-management and use of proper body mechanics and perform work duties.   Time 12   Period Weeks   Status Achieved   PT LONG TERM GOAL #2   Title Pt will report sleeping with <2x of interruptions by pain in order to improve sleep.   Time 12   Period Weeks   Status Achieved   PT LONG TERM GOAL #3   Title Pt will score a decrease in ODI  from 36% to < 20% in order to return to ADLs and work duties without pain.    Time 12   Period Weeks   Status On-going               Plan - 10/02/14 1602    Clinical Impression Statement Pt demo good carry over with deep core strengthening but required cuing to pause between  inhale and exhale in order to prevent dizziness. Pt performed her breathing coordination correctly after training with 5 reps of resistive exercise and reported no dizziness. Initiated thoracolumbar strengthening, multifidis, obliques today. Internal pelvic floor reassessment was deferred today due to pt's complaint of itchiness in her vaginal area. Pt will continue to benefit from skilled PT to advance further with functional strengthening.    Pt will benefit from skilled therapeutic intervention in order to improve on the following deficits Impaired flexibility;Improper body mechanics;Decreased range of motion;Pain;Decreased activity tolerance;Increased fascial restricitons;Increased muscle spasms;Decreased scar mobility;Decreased endurance;Decreased safety awareness;Decreased mobility;Decreased strength;Decreased knowledge of precautions;Decreased coordination   Rehab Potential Good   PT Frequency 1x / week   PT Duration 12 weeks   PT Treatment/Interventions ADLs/Self Care Home Management;Moist Heat;Therapeutic activities;Patient/family education;Scar mobilization;Aquatic Therapy;Therapeutic exercise;Passive range of motion;Balance training;Manual techniques;Dry needling;Cryotherapy;Stair training;Neuromuscular re-education;Energy conservation;Functional mobility training   PT Next Visit Plan assess pelvic floor as appropriate without contraindications   Consulted and Agree with Plan of Care Patient        Problem List There are no active problems to display for this patient.   [name] ,PT, DPT, E-RYT  10/02/2014, 4:08 PM  [place] MAIN [facility] SERVICES [address] [place], [place], [address] Phone: [phone]   Fax:  [phone]

## 2014-10-02 NOTE — Patient Instructions (Signed)
New HEP (hand out)

## 2014-10-06 ENCOUNTER — Encounter: Payer: 59 | Admitting: Physical Therapy

## 2014-10-13 ENCOUNTER — Encounter: Payer: 59 | Admitting: Physical Therapy

## 2014-10-16 ENCOUNTER — Encounter: Payer: 59 | Admitting: Physical Therapy

## 2014-10-23 ENCOUNTER — Encounter: Payer: 59 | Admitting: Physical Therapy

## 2014-10-30 ENCOUNTER — Encounter: Payer: 59 | Admitting: Physical Therapy

## 2014-12-04 DIAGNOSIS — J309 Allergic rhinitis, unspecified: Secondary | ICD-10-CM | POA: Insufficient documentation

## 2014-12-04 DIAGNOSIS — Z85828 Personal history of other malignant neoplasm of skin: Secondary | ICD-10-CM | POA: Insufficient documentation

## 2014-12-06 ENCOUNTER — Ambulatory Visit (INDEPENDENT_AMBULATORY_CARE_PROVIDER_SITE_OTHER): Payer: 59 | Admitting: Family Medicine

## 2014-12-06 ENCOUNTER — Encounter: Payer: Self-pay | Admitting: Family Medicine

## 2014-12-06 VITALS — BP 96/64 | HR 90 | Temp 98.3°F | Ht 64.0 in | Wt 155.0 lb

## 2014-12-06 DIAGNOSIS — L259 Unspecified contact dermatitis, unspecified cause: Secondary | ICD-10-CM | POA: Insufficient documentation

## 2014-12-06 DIAGNOSIS — N63 Unspecified lump in breast: Secondary | ICD-10-CM | POA: Diagnosis not present

## 2014-12-06 DIAGNOSIS — N6313 Unspecified lump in the right breast, lower outer quadrant: Secondary | ICD-10-CM | POA: Insufficient documentation

## 2014-12-06 HISTORY — DX: Unspecified lump in the right breast, lower outer quadrant: N63.13

## 2014-12-06 MED ORDER — TRIAMCINOLONE ACETONIDE 0.5 % EX CREA: 1.0000 | TOPICAL_CREAM | Freq: Two times a day (BID) | CUTANEOUS | Status: DC | PRN

## 2014-12-06 MED ORDER — TRIAMCINOLONE ACETONIDE 0.5 % EX CREA: 1.0000 "application " | TOPICAL_CREAM | Freq: Two times a day (BID) | CUTANEOUS | Status: DC | PRN

## 2014-12-06 NOTE — Progress Notes (Signed)
   BP 96/64 mmHg  Pulse 90  Temp(Src) 98.3 F (36.8 C)  Ht 5\' 4"  (1.626 m)  Wt 155 lb (70.308 kg)  BMI 26.59 kg/m2  SpO2 96%   Subjective:    Patient ID: Karen Walker, female    DOB: October 14, 1970, 44 y.o.   MRN: [id number]  HPI: Karen Walker is a 44 y.o. female  Chief Complaint  Patient presents with  . Breast Mass    underside of right breast   She was checking herself the other day and found a little lump in the right breast, it is a little sore; two weeks duration; she has a hx of small lump in the breast once before and had something drained; when she pushes on it, it is sore; no nipple discharge; no lumps in the underarms; no fevers or night sweats; just hot flashes; she stopped the estrace maybe 6 weeks ago; not sure about when her last mammogram was, cannot guess; no family hx of breast problems  She also has poison ivy; going on for less than a week, still has some places that are red and itchy  Relevant past medical, surgical, family and social history reviewed and updated as indicated. Interim medical history since our last visit reviewed. Allergies and medications reviewed and updated.  Review of Systems  Per HPI unless specifically indicated above     Objective:    BP 96/64 mmHg  Pulse 90  Temp(Src) 98.3 F (36.8 C)  Ht 5\' 4"  (1.626 m)  Wt 155 lb (70.308 kg)  BMI 26.59 kg/m2  SpO2 96%  Wt Readings from Last 3 Encounters:  12/06/14 155 lb (70.308 kg)  07/31/14 153 lb (69.4 kg)    Physical Exam  Constitutional: She appears well-developed and well-nourished. No distress.  Eyes: EOM are normal. No scleral icterus.  Neck: No thyromegaly present.  Cardiovascular: Normal rate.   Pulmonary/Chest: Effort normal. She exhibits no deformity and no retraction. Right breast exhibits mass (very small (about the size of a pea) soft mass on the far lateral right breast at about the 8 o'clock position which corresponds to what patient feels). Right breast exhibits no  inverted nipple, no nipple discharge, no skin change and no tenderness. Left breast exhibits no inverted nipple, no mass, no nipple discharge, no skin change and no tenderness. Breasts are symmetrical.  Abdominal: She exhibits no distension.  Skin: Rash (erythematous maculopapular rash on the arms, some excoriations; no active vesicles) noted. No pallor.  Psychiatric: She has a normal mood and affect. Her behavior is normal. Judgment and thought content normal.      Assessment & Plan:   Problem List Items Addressed This Visit      Musculoskeletal and Integument   Contact dermatitis    Topical prescription cream        Other   Breast lump on right side at 8 o'clock position - Primary    Small, but palpable and corresponds to what patient feels in lateral right breast; will get mammogram and [place] with follow-up as imaging recommends or referral      Relevant Orders   MM Digital Diagnostic Unilat R   US BREAST LTD UNI RIGHT INC AXILLA      Follow up plan: Return if symptoms worsen or fail to improve.

## 2014-12-06 NOTE — Patient Instructions (Signed)
We'll contact you about the mammogram and ultrasound Try benadryl for itching at night I'll send a new cream to the pharmacy for your itching

## 2014-12-07 NOTE — Assessment & Plan Note (Signed)
Small, but palpable and corresponds to what patient feels in lateral right breast; will get mammogram and [place] with follow-up as imaging recommends or referral

## 2014-12-07 NOTE — Assessment & Plan Note (Signed)
Topical prescription cream

## 2014-12-12 ENCOUNTER — Other Ambulatory Visit: Payer: Self-pay

## 2014-12-12 ENCOUNTER — Telehealth: Payer: Self-pay | Admitting: Family Medicine

## 2014-12-12 DIAGNOSIS — N6313 Unspecified lump in the right breast, lower outer quadrant: Secondary | ICD-10-CM

## 2014-12-12 NOTE — Telephone Encounter (Signed)
I closed the note below premature; I called to speak with Langley Gauss, but reached voicemail; I left message asking for a return call All I need is a limited US of RIGHT lateral breast and diagnostic mammo of RIGHT breast; left breast can be screening, no problems

## 2014-12-12 NOTE — Telephone Encounter (Signed)
I spoke with Langley Gauss about the request for bilateral mammograms and bilateral ultrasounds, [phone]

## 2014-12-15 ENCOUNTER — Telehealth: Payer: Self-pay

## 2014-12-15 NOTE — Telephone Encounter (Signed)
Left message with appt. Info.

## 2014-12-15 NOTE — Telephone Encounter (Signed)
Dx. Uni right mammo and Uni right ltd u/s scheduled at [facility] for Mon. Sept 12 @ 10:40am. Left message to call.

## 2014-12-18 ENCOUNTER — Ambulatory Visit
Admission: RE | Admit: 2014-12-18 | Discharge: 2014-12-18 | Disposition: A | Discharge status: Home / Self Care | Payer: 59 | Source: Ambulatory Visit | Attending: Family Medicine | Admitting: Family Medicine

## 2014-12-18 DIAGNOSIS — R928 Other abnormal and inconclusive findings on diagnostic imaging of breast: Secondary | ICD-10-CM | POA: Diagnosis not present

## 2014-12-18 DIAGNOSIS — N63 Unspecified lump in breast: Secondary | ICD-10-CM | POA: Diagnosis present

## 2015-07-09 ENCOUNTER — Other Ambulatory Visit: Payer: Self-pay | Admitting: Family Medicine

## 2015-07-09 MED ORDER — ACYCLOVIR 800 MG PO TABS: 800.0000 mg | ORAL_TABLET | Freq: Two times a day (BID) | ORAL | Status: DC

## 2015-07-09 NOTE — Telephone Encounter (Signed)
No sig, so assuming she uses alternate therapy for oral fever blisters; rx sent

## 2015-07-09 NOTE — Telephone Encounter (Signed)
Requesting refill on Acyclovir 8mg . Please send to [facility]. Please call once complete. Last seen 12-06-14

## 2015-07-16 ENCOUNTER — Encounter: Payer: Self-pay | Admitting: Family Medicine

## 2015-07-16 ENCOUNTER — Ambulatory Visit (INDEPENDENT_AMBULATORY_CARE_PROVIDER_SITE_OTHER): Payer: 59 | Admitting: Family Medicine

## 2015-07-16 VITALS — BP 101/67 | HR 84 | Temp 98.4°F | Ht 64.0 in | Wt 154.0 lb

## 2015-07-16 DIAGNOSIS — R202 Paresthesia of skin: Secondary | ICD-10-CM

## 2015-07-16 DIAGNOSIS — E538 Deficiency of other specified B group vitamins: Secondary | ICD-10-CM | POA: Diagnosis not present

## 2015-07-16 NOTE — Progress Notes (Signed)
BP 101/67 mmHg  Pulse 84  Temp(Src) 98.4 F (36.9 C)  Ht _0  (1.626 m)  Wt 154 lb (69.854 kg)  BMI 26.42 kg/m2  SpO2 97%   Subjective:    Patient ID: Karen Walker, female    DOB: 1971-03-29, 45 y.o.   MRN: [id number]  HPI: Karen Walker is a 45 y.o. female  Chief Complaint  Patient presents with  . Numbness    Patient has numbness in her left thumb that travels up her arm. Patient states that when it goes numb her skin will start to itch. Patient does have weakness in the arm also.   NUMBNESS Duration: months Onset: sudden Location: started in her thumb and up her arm into her shoulder Bilateral: no Symmetric: no Decreased sensation: yes  Weakness: yes Pain: yes Quality:  tingling Severity: moderate  Frequency: intermittent Trauma: no Recent illness: no Diabetes: no Thyroid disease: no  HIV: no  Alcoholism: no  Spinal cord injury: no Alleviating factors: hitting it Aggravating factors: working Status: uncontrolled Treatments attempted: nothing   Relevant past medical, surgical, family and social history reviewed and updated as indicated. Interim medical history since our last visit reviewed. Allergies and medications reviewed and updated.  Review of Systems  Per HPI unless specifically indicated above     Objective:    BP 101/67 mmHg  Pulse 84  Temp(Src) 98.4 F (36.9 C)  Ht _1  (1.626 m)  Wt 154 lb (69.854 kg)  BMI 26.42 kg/m2  SpO2 97%  Wt Readings from Last 3 Encounters:  07/16/15 154 lb (69.854 kg)  12/06/14 155 lb (70.308 kg)  07/31/14 153 lb (69.4 kg)    Physical Exam  Constitutional: She is oriented to person, place, and time. She appears well-developed and well-nourished. No distress.  HENT:  Head: Normocephalic and atraumatic.  Right Ear: Hearing normal.  Left Ear: Hearing normal.  Nose: Nose normal.  Eyes: Conjunctivae and lids are normal. Right eye exhibits no discharge. Left eye exhibits no discharge. No scleral icterus.   Pulmonary/Chest: Effort normal. No respiratory distress.  Musculoskeletal: Normal range of motion. She exhibits no edema or tenderness.  Neurological: She is alert and oriented to person, place, and time. She has normal reflexes. She displays normal reflexes. No cranial nerve deficit. She exhibits normal muscle tone. Coordination normal.  Negative Tinel's, negative Phalen's, Decreased sensation to light touch on the L to C5 and C6, otherwise normal  Skin: Skin is warm, dry and intact. No rash noted. No erythema. No pallor.  Psychiatric: She has a normal mood and affect. Her speech is normal and behavior is normal. Judgment and thought content normal. Cognition and memory are normal.  Nursing note and vitals reviewed.   Results for orders placed or performed in visit on 08/22/13  Troponin I  Result Value Ref Range   Troponin-I < 0.02 ng/mL  CBC  Result Value Ref Range   WBC 6.0 3.6-11.0 x10 3/mm 3   RBC 5.01 3.80-5.20 X10 6/mm 3   HGB 15.1 12.0-16.0 g/dL   HCT 45.6 35.0-47.0 %   MCV 91 80-100 fL   MCH 30.2 26.0-34.0 pg   MCHC 33.2 32.0-36.0 g/dL   RDW 12.7 11.5-14.5 %   Platelet 224 150-440 x10 3/mm 3  Comprehensive metabolic panel  Result Value Ref Range   Glucose 97 65-99 mg/dL   BUN 6 (L) 7-18 mg/dL   Creatinine 0.62 0.60-1.30 mg/dL   Sodium 138 136-145 mmol/L   Potassium 3.3 (L) 3.5-5.1  mmol/L   Chloride 106 98-107 mmol/L   Co2 24 21-32 mmol/L   Calcium, Total 9.5 8.5-10.1 mg/dL   SGOT(AST) 25 15-37 Unit/L   SGPT (ALT) 64 12-78 U/L   Alkaline Phosphatase 52 Unit/L   Albumin 4.3 3.4-5.0 g/dL   Total Protein 8.8 (H) 6.4-8.2 g/dL   Bilirubin,Total 0.7 0.2-1.0 mg/dL   Osmolality 273 275-301   Anion Gap 8 7-16   EGFR (African American) >60    EGFR (Non-African Amer.) >60   CK total and CKMB (cardiac)  Result Value Ref Range   CK, Total 122 Unit/L   CK-MB < 0.5 (L) 0.5-3.6 ng/mL      Assessment & Plan:   Problem List Items Addressed This Visit    None    Visit  Diagnoses    Paresthesias    -  Primary    Suspect CTS. Exercises and brace given. Will check labs. Recheck 2-3 weeks. If not getting better or getting worse, consider neurology referral or PT.     Relevant Orders    CBC with Differential/Platelet    Comprehensive metabolic panel    Thyroid Panel With TSH    B12 deficiency        On oral. Will check labs. Await results.    Relevant Orders    B12 and Folate Panel        Follow up plan: Return 2-3 weeks, for recheck numbness.

## 2015-07-17 ENCOUNTER — Encounter: Payer: Self-pay | Admitting: Family Medicine

## 2015-07-17 LAB — COMPREHENSIVE METABOLIC PANEL
A/G RATIO: 1.6 (ref 1.2–2.2)
ALBUMIN: 4.1 g/dL (ref 3.5–5.5)
ALT: 21 IU/L (ref 0–32)
AST: 17 IU/L (ref 0–40)
Alkaline Phosphatase: 59 IU/L (ref 39–117)
BILIRUBIN TOTAL: 0.7 mg/dL (ref 0.0–1.2)
BUN / CREAT RATIO: 21 (ref 9–23)
BUN: 13 mg/dL (ref 6–24)
CALCIUM: 9.5 mg/dL (ref 8.7–10.2)
CHLORIDE: 103 mmol/L (ref 96–106)
CO2: 24 mmol/L (ref 18–29)
Creatinine, Ser: 0.63 mg/dL (ref 0.57–1.00)
GFR, EST AFRICAN AMERICAN: 125 mL/min/{1.73_m2} (ref >59)
GFR, EST NON AFRICAN AMERICAN: 109 mL/min/{1.73_m2} (ref >59)
Globulin, Total: 2.5 g/dL (ref 1.5–4.5)
Glucose: 84 mg/dL (ref 65–99)
Potassium: 4 mmol/L (ref 3.5–5.2)
Sodium: 143 mmol/L (ref 134–144)
TOTAL PROTEIN: 6.6 g/dL (ref 6.0–8.5)

## 2015-07-17 LAB — THYROID PANEL WITH TSH
FREE THYROXINE INDEX: 2.4 (ref 1.2–4.9)
T3 UPTAKE RATIO: 28 % (ref 24–39)
T4, Total: 8.7 ug/dL (ref 4.5–12.0)
TSH: 1.97 u[IU]/mL (ref 0.450–4.500)

## 2015-07-17 LAB — CBC WITH DIFFERENTIAL/PLATELET
BASOS: 0 %
Basophils Absolute: 0 10*3/uL (ref 0.0–0.2)
EOS (ABSOLUTE): 0.2 10*3/uL (ref 0.0–0.4)
EOS: 4 %
HEMOGLOBIN: 12.4 g/dL (ref 11.1–15.9)
Hematocrit: 37.7 % (ref 34.0–46.6)
IMMATURE GRANS (ABS): 0 10*3/uL (ref 0.0–0.1)
IMMATURE GRANULOCYTES: 0 %
LYMPHS: 47 %
Lymphocytes Absolute: 1.7 10*3/uL (ref 0.7–3.1)
MCH: 29.5 pg (ref 26.6–33.0)
MCHC: 32.9 g/dL (ref 31.5–35.7)
MCV: 90 fL (ref 79–97)
MONOCYTES: 5 %
Monocytes Absolute: 0.2 10*3/uL (ref 0.1–0.9)
NEUTROS ABS: 1.6 10*3/uL (ref 1.4–7.0)
Neutrophils: 44 %
Platelets: 183 10*3/uL (ref 150–379)
RBC: 4.21 x10E6/uL (ref 3.77–5.28)
RDW: 13.3 % (ref 12.3–15.4)
WBC: 3.6 10*3/uL (ref 3.4–10.8)

## 2015-07-17 LAB — B12 AND FOLATE PANEL: Vitamin B-12: 1327 pg/mL — ABNORMAL HIGH (ref 211–946)

## 2015-07-30 ENCOUNTER — Ambulatory Visit: Payer: 59 | Admitting: Family Medicine

## 2015-08-10 ENCOUNTER — Ambulatory Visit (INDEPENDENT_AMBULATORY_CARE_PROVIDER_SITE_OTHER): Payer: 59 | Admitting: Family Medicine

## 2015-08-10 ENCOUNTER — Encounter: Payer: Self-pay | Admitting: Family Medicine

## 2015-08-10 VITALS — BP 100/69 | HR 73 | Temp 97.3°F | Wt 153.0 lb

## 2015-08-10 DIAGNOSIS — N63 Unspecified lump in breast: Secondary | ICD-10-CM | POA: Diagnosis not present

## 2015-08-10 DIAGNOSIS — G5602 Carpal tunnel syndrome, left upper limb: Secondary | ICD-10-CM | POA: Diagnosis not present

## 2015-08-10 DIAGNOSIS — N6313 Unspecified lump in the right breast, lower outer quadrant: Secondary | ICD-10-CM

## 2015-08-10 NOTE — Assessment & Plan Note (Signed)
Will set up diagnostic mammogram and ultrasound. Likely cyst. Orders put in today. Let us know if not getting better or getting worse.

## 2015-08-10 NOTE — Progress Notes (Signed)
BP 100/69 mmHg  Pulse 73  Temp(Src) 97.3 F (36.3 C)  Wt 153 lb (69.4 kg)  SpO2 98%   Subjective:    Patient ID: Karen Walker, female    DOB: Oct 19, 1970, 45 y.o.   MRN: [id number]  HPI: Karen Walker is a 45 y.o. female  Chief Complaint  Patient presents with  . Breast Mass    right side of breast  . Arm Pain   BREAST MASS Duration : couple of years Location: right Onset: gradual Severity: mild Quality: aching Frequency: intermittent Redness: no Swelling: no Trauma: no trauma Breastfeeding: no Associated with menstral cycle: no Nipple discharge: no Breast lump: yes Status: stable Treatments attempted: none Previous mammogram: yes  ARM PAIN- still having it. Exercises helping. Doesn't want to do PT at this time or do a shot Duration: months Onset: sudden Location: started in her thumb and up her arm into her shoulder Bilateral: no Symmetric: no Decreased sensation: yes  Weakness: yes Pain: yes Quality: tingling Severity: moderate  Frequency: intermittent Trauma: no Recent illness: no Diabetes: no Thyroid disease: no  HIV: no  Alcoholism: no  Spinal cord injury: no Alleviating factors: hitting it Aggravating factors: working Status: uncontrolled Treatments attempted: nothing  Relevant past medical, surgical, family and social history reviewed and updated as indicated. Interim medical history since our last visit reviewed. Allergies and medications reviewed and updated.  Review of Systems  Respiratory: Negative.   Cardiovascular: Negative.   Musculoskeletal: Positive for myalgias. Negative for back pain, joint swelling, arthralgias, gait problem, neck pain and neck stiffness.  Neurological: Positive for weakness and numbness. Negative for dizziness, tremors, seizures, facial asymmetry, speech difficulty, light-headedness and headaches.  Psychiatric/Behavioral: Negative.     Per HPI unless specifically indicated above     Objective:    BP  100/69 mmHg  Pulse 73  Temp(Src) 97.3 F (36.3 C)  Wt 153 lb (69.4 kg)  SpO2 98%  Wt Readings from Last 3 Encounters:  08/10/15 153 lb (69.4 kg)  07/16/15 154 lb (69.854 kg)  12/06/14 155 lb (70.308 kg)    Physical Exam  Constitutional: She is oriented to person, place, and time. She appears well-developed and well-nourished. No distress.  HENT:  Head: Normocephalic and atraumatic.  Right Ear: Hearing normal.  Left Ear: Hearing normal.  Nose: Nose normal.  Eyes: Conjunctivae and lids are normal. Right eye exhibits no discharge. Left eye exhibits no discharge. No scleral icterus.  Pulmonary/Chest: Effort normal. No respiratory distress. Right breast exhibits no inverted nipple, no mass, no nipple discharge, no skin change and no tenderness. Left breast exhibits no inverted nipple, no mass, no nipple discharge, no skin change and no tenderness. Breasts are symmetrical.    Musculoskeletal: Normal range of motion.  Neurological: She is alert and oriented to person, place, and time.  Skin: Skin is intact. No rash noted.  Psychiatric: She has a normal mood and affect. Her speech is normal and behavior is normal. Judgment and thought content normal. Cognition and memory are normal.  Nursing note and vitals reviewed.   Results for orders placed or performed in visit on 07/16/15  CBC with Differential/Platelet  Result Value Ref Range   WBC 3.6 3.4 - 10.8 x10E3/uL   RBC 4.21 3.77 - 5.28 x10E6/uL   Hemoglobin 12.4 11.1 - 15.9 g/dL   Hematocrit 37.7 34.0 - 46.6 %   MCV 90 79 - 97 fL   MCH 29.5 26.6 - 33.0 pg   MCHC 32.9 31.5 -  35.7 g/dL   RDW 13.3 12.3 - 15.4 %   Platelets 183 150 - 379 x10E3/uL   Neutrophils 44 %   Lymphs 47 %   Monocytes 5 %   Eos 4 %   Basos 0 %   Neutrophils Absolute 1.6 1.4 - 7.0 x10E3/uL   Lymphocytes Absolute 1.7 0.7 - 3.1 x10E3/uL   Monocytes Absolute 0.2 0.1 - 0.9 x10E3/uL   EOS (ABSOLUTE) 0.2 0.0 - 0.4 x10E3/uL   Basophils Absolute 0.0 0.0 - 0.2  x10E3/uL   Immature Granulocytes 0 %   Immature Grans (Abs) 0.0 0.0 - 0.1 x10E3/uL  Comprehensive metabolic panel  Result Value Ref Range   Glucose 84 65 - 99 mg/dL   BUN 13 6 - 24 mg/dL   Creatinine, Ser 0.63 0.57 - 1.00 mg/dL   GFR calc non Af Amer 109 >59 mL/min/1.73   GFR calc Af Amer 125 >59 mL/min/1.73   BUN/Creatinine Ratio 21 9 - 23   Sodium 143 134 - 144 mmol/L   Potassium 4.0 3.5 - 5.2 mmol/L   Chloride 103 96 - 106 mmol/L   CO2 24 18 - 29 mmol/L   Calcium 9.5 8.7 - 10.2 mg/dL   Total Protein 6.6 6.0 - 8.5 g/dL   Albumin 4.1 3.5 - 5.5 g/dL   Globulin, Total 2.5 1.5 - 4.5 g/dL   Albumin/Globulin Ratio 1.6 1.2 - 2.2   Bilirubin Total 0.7 0.0 - 1.2 mg/dL   Alkaline Phosphatase 59 39 - 117 IU/L   AST 17 0 - 40 IU/L   ALT 21 0 - 32 IU/L  Thyroid Panel With TSH  Result Value Ref Range   TSH 1.970 0.450 - 4.500 uIU/mL   T4, Total 8.7 4.5 - 12.0 ug/dL   T3 Uptake Ratio 28 24 - 39 %   Free Thyroxine Index 2.4 1.2 - 4.9  B12 and Folate Panel  Result Value Ref Range   Vitamin B-12 1327 (H) 211 - 946 pg/mL   Folate >20.0 >3.0 ng/mL      Assessment & Plan:   Problem List Items Addressed This Visit      Other   Breast lump on right side at 8 o'clock position - Primary    Will set up diagnostic mammogram and ultrasound. Likely cyst. Orders put in today. Let us know if not getting better or getting worse.       Relevant Orders   MM Digital Diagnostic Bilat   US BREAST COMPLETE UNI RIGHT INC AXILLA    Other Visit Diagnoses    Carpal tunnel syndrome of left wrist        Continue exercises. Let us know if she is getting worse or not getting better.         Follow up plan: Return if symptoms worsen or fail to improve.

## 2015-08-15 ENCOUNTER — Other Ambulatory Visit: Payer: Self-pay | Admitting: Family Medicine

## 2015-08-15 DIAGNOSIS — N63 Unspecified lump in unspecified breast: Secondary | ICD-10-CM

## 2015-08-23 ENCOUNTER — Ambulatory Visit
Admission: RE | Admit: 2015-08-23 | Discharge: 2015-08-23 | Disposition: A | Discharge status: Home / Self Care | Payer: 59 | Source: Ambulatory Visit | Attending: Family Medicine | Admitting: Family Medicine

## 2015-08-23 ENCOUNTER — Other Ambulatory Visit: Payer: Self-pay | Admitting: Family Medicine

## 2015-08-23 ENCOUNTER — Telehealth: Payer: Self-pay | Admitting: Family Medicine

## 2015-08-23 DIAGNOSIS — N63 Unspecified lump in unspecified breast: Secondary | ICD-10-CM

## 2015-08-23 DIAGNOSIS — N6313 Unspecified lump in the right breast, lower outer quadrant: Secondary | ICD-10-CM

## 2015-08-23 NOTE — Telephone Encounter (Signed)
Please let her know that her mammogram and her ultrasound came back nice and normal. Nothing to worry about. Thanks!

## 2015-08-23 NOTE — Telephone Encounter (Signed)
Patient notified

## 2015-08-30 DIAGNOSIS — R234 Changes in skin texture: Secondary | ICD-10-CM | POA: Diagnosis not present

## 2015-08-30 DIAGNOSIS — Z85820 Personal history of malignant melanoma of skin: Secondary | ICD-10-CM | POA: Diagnosis not present

## 2015-08-30 DIAGNOSIS — L219 Seborrheic dermatitis, unspecified: Secondary | ICD-10-CM | POA: Diagnosis not present

## 2015-08-30 DIAGNOSIS — L578 Other skin changes due to chronic exposure to nonionizing radiation: Secondary | ICD-10-CM | POA: Diagnosis not present

## 2015-08-30 DIAGNOSIS — L812 Freckles: Secondary | ICD-10-CM | POA: Diagnosis not present

## 2015-08-30 DIAGNOSIS — Z1283 Encounter for screening for malignant neoplasm of skin: Secondary | ICD-10-CM | POA: Diagnosis not present

## 2015-08-30 DIAGNOSIS — D229 Melanocytic nevi, unspecified: Secondary | ICD-10-CM | POA: Diagnosis not present

## 2015-08-30 DIAGNOSIS — I781 Nevus, non-neoplastic: Secondary | ICD-10-CM | POA: Diagnosis not present

## 2015-08-30 DIAGNOSIS — I83813 Varicose veins of bilateral lower extremities with pain: Secondary | ICD-10-CM | POA: Diagnosis not present

## 2015-10-22 ENCOUNTER — Ambulatory Visit: Payer: 59 | Admitting: Family Medicine

## 2015-10-24 ENCOUNTER — Ambulatory Visit (INDEPENDENT_AMBULATORY_CARE_PROVIDER_SITE_OTHER): Payer: 59 | Admitting: Family Medicine

## 2015-10-24 ENCOUNTER — Encounter: Payer: Self-pay | Admitting: Family Medicine

## 2015-10-24 VITALS — BP 93/61 | HR 82 | Temp 98.0°F | Wt 157.0 lb

## 2015-10-24 DIAGNOSIS — R109 Unspecified abdominal pain: Secondary | ICD-10-CM | POA: Diagnosis not present

## 2015-10-24 DIAGNOSIS — H66002 Acute suppurative otitis media without spontaneous rupture of ear drum, left ear: Secondary | ICD-10-CM

## 2015-10-24 LAB — CBC WITH DIFFERENTIAL/PLATELET
HEMOGLOBIN: 11.7 g/dL (ref 11.1–15.9)
Hematocrit: 33.3 % — ABNORMAL LOW (ref 34.0–46.6)
LYMPHS ABS: 1.8 10*3/uL (ref 0.7–3.1)
Lymphs: 50 %
MCH: 31.2 pg (ref 26.6–33.0)
MCHC: 35.1 g/dL (ref 31.5–35.7)
MCV: 89 fL (ref 79–97)
MID (Absolute): 0.3 10*3/uL (ref 0.1–1.6)
MID: 10 %
NEUTROS ABS: 1.4 10*3/uL (ref 1.4–7.0)
NEUTROS PCT: 40 %
PLATELETS: 172 10*3/uL (ref 150–379)
RBC: 3.75 x10E6/uL — ABNORMAL LOW (ref 3.77–5.28)
RDW: 12.3 % (ref 12.3–15.4)
WBC: 3.5 10*3/uL (ref 3.4–10.8)

## 2015-10-24 LAB — UA/M W/RFLX CULTURE, ROUTINE
BILIRUBIN UA: NEGATIVE
GLUCOSE, UA: NEGATIVE
KETONES UA: NEGATIVE
LEUKOCYTES UA: NEGATIVE
Nitrite, UA: NEGATIVE
PROTEIN UA: NEGATIVE
SPEC GRAV UA: 1.015 (ref 1.005–1.030)
Urobilinogen, Ur: 0.2 mg/dL (ref 0.2–1.0)
pH, UA: 5.5 (ref 5.0–7.5)

## 2015-10-24 LAB — MICROSCOPIC EXAMINATION: WBC, UA: NONE SEEN /hpf (ref 0–?)

## 2015-10-24 MED ORDER — AMOXICILLIN 500 MG PO CAPS: 500.0000 mg | ORAL_CAPSULE | Freq: Two times a day (BID) | ORAL | Status: DC

## 2015-10-24 MED ORDER — ONDANSETRON 4 MG PO TBDP: 4.0000 mg | ORAL_TABLET | Freq: Three times a day (TID) | ORAL | Status: DC | PRN

## 2015-10-24 NOTE — Progress Notes (Signed)
BP 93/61 mmHg  Pulse 82  Temp(Src) 98 F (36.7 C)  Wt 157 lb (71.215 kg)  SpO2 98%   Subjective:    Patient ID: Karen Mess., female    DOB: 08-Apr-1970, 45 y.o.   MRN: [id number]  HPI: Karen Walker is a 45 y.o. female  Chief Complaint  Patient presents with  . Abdominal Pain    started Friday. Saturday she started feeling very hot, her body/bones hurt, very tired. Did start having diarrhea. She did have some nausea but no vomiting. She feels somewhat better but stomach still "feels sore".  . Cough    she's had a dry cough for 2-3 weeks   Patient presents with 5 day history of abdominal pain, body aches, fatigue, and fever. Started having nausea and diarrhea the second and third day. She feels like her symptoms are resolving for the most part, still feels fatigued and has some abdominal soreness. Has been taking dramamine, mint tea, and bay leaves with mild relief.  Denies vomiting, blood in stools   Also c/o dry cough for 2 weeks, started yesterday with a headache and sinus pressure. Denies nasal congestion, sore throat, but does note left ear pain and some muffled hearing.   Relevant past medical, surgical, family and social history reviewed and updated as indicated. Interim medical history since our last visit reviewed. Allergies and medications reviewed and updated.  Review of Systems  Constitutional: Positive for fever, appetite change and fatigue.  HENT: Positive for ear pain, hearing loss (left, muffled hearing) and sinus pressure.   Eyes: Negative.   Respiratory: Negative.  Negative for shortness of breath.   Cardiovascular: Negative.   Gastrointestinal: Positive for nausea, abdominal pain and diarrhea. Negative for vomiting and blood in stool.  Genitourinary: Negative.   Musculoskeletal: Positive for myalgias.  Skin: Negative.   Neurological: Negative.   Psychiatric/Behavioral: Negative.     Per HPI unless specifically indicated above     Objective:      BP 93/61 mmHg  Pulse 82  Temp(Src) 98 F (36.7 C)  Wt 157 lb (71.215 kg)  SpO2 98%  Wt Readings from Last 3 Encounters:  10/24/15 157 lb (71.215 kg)  08/10/15 153 lb (69.4 kg)  07/16/15 154 lb (69.854 kg)    Physical Exam  Constitutional: She is oriented to person, place, and time. She appears well-developed and well-nourished.  HENT:  Head: Atraumatic.  Right Ear: External ear normal.  Left Ear: External ear normal.  Nose: Nose normal.  Mouth/Throat: Oropharynx is clear and moist. No oropharyngeal exudate.  Right TM normal Left TM erythematous and edematous  Eyes: Conjunctivae are normal. No scleral icterus.  Neck: Normal range of motion. Neck supple.  Cardiovascular: Normal rate, regular rhythm and normal heart sounds.   Pulmonary/Chest: Effort normal and breath sounds normal.  Abdominal: Soft. Bowel sounds are normal. She exhibits no distension. Tenderness: Mild TTP in epigastric area. There is no guarding.  Musculoskeletal: Normal range of motion.  Lymphadenopathy:    She has no cervical adenopathy.  Neurological: She is alert and oriented to person, place, and time.  Skin: Skin is warm and dry. No rash noted.  Psychiatric: She has a normal mood and affect. Her behavior is normal.  Nursing note and vitals reviewed.   Results for orders placed or performed in visit on 07/16/15  CBC with Differential/Platelet  Result Value Ref Range   WBC 3.6 3.4 - 10.8 x10E3/uL   RBC 4.21 3.77 - 5.28 x10E6/uL  Hemoglobin 12.4 11.1 - 15.9 g/dL   Hematocrit 37.7 34.0 - 46.6 %   MCV 90 79 - 97 fL   MCH 29.5 26.6 - 33.0 pg   MCHC 32.9 31.5 - 35.7 g/dL   RDW 13.3 12.3 - 15.4 %   Platelets 183 150 - 379 x10E3/uL   Neutrophils 44 %   Lymphs 47 %   Monocytes 5 %   Eos 4 %   Basos 0 %   Neutrophils Absolute 1.6 1.4 - 7.0 x10E3/uL   Lymphocytes Absolute 1.7 0.7 - 3.1 x10E3/uL   Monocytes Absolute 0.2 0.1 - 0.9 x10E3/uL   EOS (ABSOLUTE) 0.2 0.0 - 0.4 x10E3/uL   Basophils Absolute  0.0 0.0 - 0.2 x10E3/uL   Immature Granulocytes 0 %   Immature Grans (Abs) 0.0 0.0 - 0.1 x10E3/uL  Comprehensive metabolic panel  Result Value Ref Range   Glucose 84 65 - 99 mg/dL   BUN 13 6 - 24 mg/dL   Creatinine, Ser 0.63 0.57 - 1.00 mg/dL   GFR calc non Af Amer 109 >59 mL/min/1.73   GFR calc Af Amer 125 >59 mL/min/1.73   BUN/Creatinine Ratio 21 9 - 23   Sodium 143 134 - 144 mmol/L   Potassium 4.0 3.5 - 5.2 mmol/L   Chloride 103 96 - 106 mmol/L   CO2 24 18 - 29 mmol/L   Calcium 9.5 8.7 - 10.2 mg/dL   Total Protein 6.6 6.0 - 8.5 g/dL   Albumin 4.1 3.5 - 5.5 g/dL   Globulin, Total 2.5 1.5 - 4.5 g/dL   Albumin/Globulin Ratio 1.6 1.2 - 2.2   Bilirubin Total 0.7 0.0 - 1.2 mg/dL   Alkaline Phosphatase 59 39 - 117 IU/L   AST 17 0 - 40 IU/L   ALT 21 0 - 32 IU/L  Thyroid Panel With TSH  Result Value Ref Range   TSH 1.970 0.450 - 4.500 uIU/mL   T4, Total 8.7 4.5 - 12.0 ug/dL   T3 Uptake Ratio 28 24 - 39 %   Free Thyroxine Index 2.4 1.2 - 4.9  B12 and Folate Panel  Result Value Ref Range   Vitamin B-12 1327 (H) 211 - 946 pg/mL   Folate >20.0 >3.0 ng/mL      Assessment & Plan:   Problem List Items Addressed This Visit    None    Visit Diagnoses    Abdominal pain, unspecified abdominal location    -  Primary    Relevant Orders    UA/M w/rflx Culture, Routine (STAT)    CBC With Differential/Platelet    Comprehensive metabolic panel    Lipase    Acute suppurative otitis media of left ear without spontaneous rupture of tympanic membrane, recurrence not specified        Relevant Medications    amoxicillin (AMOXIL) 500 MG capsule     Patient likely had a GI virus hit Friday when symptoms began, resolving. Zofran sent to help her return to normal diet and regain strength.  Amoxicillin sent for ear infection. Pt to follow up if no improvement by Monday.    Follow up plan: Return if symptoms worsen or fail to improve.

## 2015-10-24 NOTE — Patient Instructions (Signed)
Follow up if no improvement 

## 2015-10-25 LAB — COMPREHENSIVE METABOLIC PANEL
ALT: 20 IU/L (ref 0–32)
AST: 20 IU/L (ref 0–40)
Albumin/Globulin Ratio: 1.5 (ref 1.2–2.2)
Albumin: 4 g/dL (ref 3.5–5.5)
Alkaline Phosphatase: 54 IU/L (ref 39–117)
BUN/Creatinine Ratio: 20 (ref 9–23)
BUN: 13 mg/dL (ref 6–24)
Bilirubin Total: 0.4 mg/dL (ref 0.0–1.2)
CALCIUM: 9 mg/dL (ref 8.7–10.2)
CO2: 26 mmol/L (ref 18–29)
CREATININE: 0.65 mg/dL (ref 0.57–1.00)
Chloride: 102 mmol/L (ref 96–106)
GFR calc Af Amer: 124 mL/min/{1.73_m2} (ref >59)
GFR, EST NON AFRICAN AMERICAN: 108 mL/min/{1.73_m2} (ref >59)
GLUCOSE: 103 mg/dL — AB (ref 65–99)
Globulin, Total: 2.6 g/dL (ref 1.5–4.5)
Potassium: 3.9 mmol/L (ref 3.5–5.2)
Sodium: 141 mmol/L (ref 134–144)
Total Protein: 6.6 g/dL (ref 6.0–8.5)

## 2015-10-25 LAB — LIPASE: Lipase: 58 U/L (ref 0–59)

## 2015-10-26 ENCOUNTER — Encounter: Payer: Self-pay | Admitting: Family Medicine

## 2015-10-26 ENCOUNTER — Telehealth: Payer: Self-pay

## 2015-10-26 MED ORDER — ACYCLOVIR 800 MG PO TABS: 800.0000 mg | ORAL_TABLET | Freq: Two times a day (BID) | ORAL | Status: DC

## 2015-10-26 NOTE — Telephone Encounter (Signed)
Refilled the zovirax tablets that Dr. Sanda Klein had written for her.

## 2015-10-26 NOTE — Telephone Encounter (Signed)
Patient called, she states that you were going to give her something for her fever blisters (a cream) I don't see where it was discussed at the visit. Do you remember talking about this with the patient? If so please send the medication to her pharmacy.

## 2015-11-01 ENCOUNTER — Emergency Department
Admission: EM | Admit: 2015-11-01 | Discharge: 2015-11-01 | Disposition: A | Discharge status: Home / Self Care | Payer: 59 | Attending: Emergency Medicine | Admitting: Emergency Medicine

## 2015-11-01 ENCOUNTER — Encounter: Payer: Self-pay | Admitting: Physician Assistant

## 2015-11-01 ENCOUNTER — Ambulatory Visit: Payer: Self-pay | Admitting: Physician Assistant

## 2015-11-01 ENCOUNTER — Emergency Department: Payer: 59

## 2015-11-01 ENCOUNTER — Encounter: Payer: Self-pay | Admitting: Emergency Medicine

## 2015-11-01 VITALS — BP 94/60 | HR 80 | Temp 98.5°F

## 2015-11-01 DIAGNOSIS — Z85828 Personal history of other malignant neoplasm of skin: Secondary | ICD-10-CM | POA: Diagnosis not present

## 2015-11-01 DIAGNOSIS — R300 Dysuria: Secondary | ICD-10-CM | POA: Diagnosis present

## 2015-11-01 DIAGNOSIS — R109 Unspecified abdominal pain: Secondary | ICD-10-CM

## 2015-11-01 DIAGNOSIS — N12 Tubulo-interstitial nephritis, not specified as acute or chronic: Secondary | ICD-10-CM | POA: Diagnosis not present

## 2015-11-01 DIAGNOSIS — R1030 Lower abdominal pain, unspecified: Secondary | ICD-10-CM | POA: Diagnosis not present

## 2015-11-01 DIAGNOSIS — K5900 Constipation, unspecified: Secondary | ICD-10-CM

## 2015-11-01 DIAGNOSIS — R319 Hematuria, unspecified: Secondary | ICD-10-CM

## 2015-11-01 DIAGNOSIS — N390 Urinary tract infection, site not specified: Secondary | ICD-10-CM

## 2015-11-01 LAB — COMPREHENSIVE METABOLIC PANEL
ALBUMIN: 4.3 g/dL (ref 3.5–5.0)
ALT: 24 U/L (ref 14–54)
ANION GAP: 9 (ref 5–15)
AST: 22 U/L (ref 15–41)
Alkaline Phosphatase: 65 U/L (ref 38–126)
BUN: 10 mg/dL (ref 6–20)
CHLORIDE: 103 mmol/L (ref 101–111)
CO2: 25 mmol/L (ref 22–32)
Calcium: 9.5 mg/dL (ref 8.9–10.3)
Creatinine, Ser: 0.55 mg/dL (ref 0.44–1.00)
GFR calc non Af Amer: >60 mL/min (ref >60)
Glucose, Bld: 99 mg/dL (ref 65–99)
Potassium: 3.4 mmol/L — ABNORMAL LOW (ref 3.5–5.1)
SODIUM: 137 mmol/L (ref 135–145)
Total Bilirubin: 0.8 mg/dL (ref 0.3–1.2)
Total Protein: 7.7 g/dL (ref 6.5–8.1)

## 2015-11-01 LAB — URINALYSIS COMPLETE WITH MICROSCOPIC (ARMC ONLY)
BACTERIA UA: NONE SEEN
Bilirubin Urine: NEGATIVE
Glucose, UA: NEGATIVE mg/dL
Ketones, ur: NEGATIVE mg/dL
Nitrite: NEGATIVE
PROTEIN: NEGATIVE mg/dL
SPECIFIC GRAVITY, URINE: 1.003 — AB (ref 1.005–1.030)
pH: 7 (ref 5.0–8.0)

## 2015-11-01 LAB — CBC
HEMATOCRIT: 38.9 % (ref 35.0–47.0)
HEMOGLOBIN: 13.2 g/dL (ref 12.0–16.0)
MCH: 29.7 pg (ref 26.0–34.0)
MCHC: 33.9 g/dL (ref 32.0–36.0)
MCV: 87.7 fL (ref 80.0–100.0)
Platelets: 230 10*3/uL (ref 150–440)
RBC: 4.43 MIL/uL (ref 3.80–5.20)
RDW: 13.1 % (ref 11.5–14.5)
WBC: 9.2 10*3/uL (ref 3.6–11.0)

## 2015-11-01 LAB — POCT URINALYSIS DIPSTICK
Bilirubin, UA: NEGATIVE
GLUCOSE UA: NEGATIVE
Ketones, UA: NEGATIVE
NITRITE UA: NEGATIVE
PH UA: 6
Protein, UA: NEGATIVE
Spec Grav, UA: 1.01
UROBILINOGEN UA: 0.2

## 2015-11-01 LAB — POCT PREGNANCY, URINE: PREG TEST UR: NEGATIVE

## 2015-11-01 LAB — LIPASE, BLOOD: LIPASE: 29 U/L (ref 11–51)

## 2015-11-01 MED ORDER — SODIUM CHLORIDE 0.9 % IV BOLUS (SEPSIS)
1000.0000 mL | Freq: Once | INTRAVENOUS | Status: AC
  Administered 2015-11-01: 1000 mL via INTRAVENOUS

## 2015-11-01 MED ORDER — OXYCODONE-ACETAMINOPHEN 5-325 MG PO TABS: 1.0000 | ORAL_TABLET | Freq: Four times a day (QID) | ORAL | 0 refills | Status: DC | PRN

## 2015-11-01 MED ORDER — DEXTROSE 5 % IV SOLN
INTRAVENOUS | Status: AC
  Filled 2015-11-01: qty 10

## 2015-11-01 MED ORDER — CIPROFLOXACIN HCL 250 MG PO TABS: 250.0000 mg | ORAL_TABLET | Freq: Two times a day (BID) | ORAL | 0 refills | Status: DC

## 2015-11-01 MED ORDER — FENTANYL CITRATE (PF) 100 MCG/2ML IJ SOLN
50.0000 ug | Freq: Once | INTRAMUSCULAR | Status: AC
  Administered 2015-11-01: 50 ug via INTRAVENOUS
  Filled 2015-11-01: qty 2

## 2015-11-01 MED ORDER — DEXTROSE 5 % IV SOLN
1.0000 g | Freq: Once | INTRAVENOUS | Status: AC
  Administered 2015-11-01: 18:00:00 via INTRAVENOUS

## 2015-11-01 MED ORDER — ONDANSETRON HCL 4 MG PO TABS: 4.0000 mg | ORAL_TABLET | Freq: Every day | ORAL | 0 refills | Status: DC | PRN

## 2015-11-01 NOTE — ED Provider Notes (Signed)
[facility] Emergency Department Provider Note  ____________________________________________   I have reviewed the triage vital signs and the nursing notes.   HISTORY  Chief Complaint Constipation    HPI Karen Walker. is a 45 y.o. female who presents today complaining of abdominal discomfort. Patient also was of dysuria and right flank pain. She was recently treated for an ear infection with amoxicillin which is completely resolved however she began to have dysuria and suprapubic cramping starting yesterday, no fever or vomiting. However now she is beginning of flank pain and feels unwell. She went to primary care doctor, as she was believing that this was partially due to constipation. Patient states she hasn't a satisfying bowel movement in 2-3 days. However at the urgent care and was noted that she had a urinary tract infection she was given Cipro. This is most likely the cause of her symptoms however she was also given MiraLAX and told that if she did not have a bowel movement she should come to the emergency department. [place] to coming here, and I she did not immediately have a bowel movement she came in for further assessment. Patient has had no chest pain or shortness of breath she denies significant abdominal pain at this time, she has an occasional cramping discomfort and some mild right flank pain. She has not vomited.      Past Medical History:  Diagnosis Date  . Allergic rhinitis   . Anxiety   . Depression   . History of skin cancer   . Hx of migraine headaches    light sensivity, unilateral HA    Patient Active Problem List   Diagnosis Date Noted  . Breast lump on right side at 8 o'clock position 12/06/2014  . Contact dermatitis 12/06/2014  . Allergic rhinitis   . History of skin cancer     Past Surgical History:  Procedure Laterality Date  . ABDOMINAL HYSTERECTOMY  2014  . BREAST CYST ASPIRATION  Right 2013   negative. Done at Dr. Dwyane Luo office  . HERNIA REPAIR  2014  . hysterectemy    . melonoma removal on R medial arch of foot  2008  . UMBILICAL HERNIA REPAIR     occurred with hysterectemy    Current Outpatient Rx  . Order #: [id number] Class: Normal  . Order #: [id number] Class: Normal  . Order #: [id number] Class: Historical Med  . Order #: [id number] Class: Historical Med  . Order #: [id number] Class: Historical Med  . Order #: [id number] Class: Normal  . Order #: [id number] Class: Historical Med  . Order #: [id number] Class: Historical Med  . Order #: [id number] Class: Normal  . Order #: [id number] Class: Historical Med    Allergies Codeine and Pollen extract  Family History  Problem Relation Age of Onset  . Stroke Father     Social History Social History  Substance Use Topics  . Smoking status: Never Smoker  . Smokeless tobacco: Never Used  . Alcohol use No    Review of Systems Constitutional: No fever/chills Eyes: No visual changes. ENT: No sore throat. No stiff neck no neck pain Cardiovascular: Denies chest pain. Respiratory: Denies shortness of breath. Gastrointestinal:   no vomiting.  No diarrhea.  See history of present illness regarding constipation. Genitourinary: Positive for dysuria. Musculoskeletal: Negative lower extremity swelling Skin: Negative for rash. Neurological: Negative for severe headaches, focal weakness or numbness. 10-point ROS otherwise negative.  ____________________________________________   PHYSICAL EXAM:  VITAL SIGNS: ED Triage Vitals  Enc Vitals  Group     BP 11/01/15 1447 111/72     Pulse Rate 11/01/15 1447 97     Resp 11/01/15 1447 18     Temp 11/01/15 1447 99.1 F (37.3 C)     Temp Source 11/01/15 1447 Oral     SpO2 11/01/15 1447 97 %     Weight 11/01/15 1444 157 lb (71.2 kg)     Height 11/01/15 1444 5\' 4"  (1.626 m)     Head Circumference --      Peak Flow --      Pain Score 11/01/15 1444 7     Pain Loc --      Pain  Edu? --      Excl. in [place]? --     Constitutional: Alert and oriented. Well appearing and in no acute distress. Eyes: Conjunctivae are normal. PERRL. EOMI. Head: Atraumatic. Nose: No congestion/rhinnorhea. Mouth/Throat: Mucous membranes are moist.  Oropharynx non-erythematous. Neck: No stridor.   Nontender with no meningismus Cardiovascular: Normal rate, regular rhythm. Grossly normal heart sounds.  Good peripheral circulation. Respiratory: Normal respiratory effort.  No retractions. Lungs CTAB. Abdominal: Soft and nontender. No distention. No guarding no rebound Back:  There is no focal tenderness or step off.  there is no midline tenderness there are no lesions noted. there is Mild right CVA tenderness Musculoskeletal: No lower extremity tenderness, no upper extremity tenderness. No joint effusions, no DVT signs strong distal pulses no edema Neurologic:  Normal speech and language. No gross focal neurologic deficits are appreciated.  Skin:  Skin is warm, dry and intact. No rash noted. Psychiatric: Mood and affect are normal. Speech and behavior are normal.  ____________________________________________   LABS (all labs ordered are listed, but only abnormal results are displayed)  Labs Reviewed  COMPREHENSIVE METABOLIC PANEL - Abnormal; Notable for the following:       Result Value   Potassium 3.4 (*)    All other components within normal limits  URINALYSIS COMPLETEWITH MICROSCOPIC ([facility] ONLY) - Abnormal; Notable for the following:    Color, Urine STRAW (*)    APPearance CLEAR (*)    Specific Gravity, Urine 1.003 (*)    Hgb urine dipstick 1+ (*)    Leukocytes, UA 3+ (*)    Squamous Epithelial / LPF 0-5 (*)    All other components within normal limits  URINE CULTURE  LIPASE, BLOOD  CBC  POC URINE PREG, ED  POCT PREGNANCY, URINE   ____________________________________________  EKG  I personally interpreted any EKGs ordered by me or  triage  ____________________________________________  RADIOLOGY  I reviewed any imaging ordered by me or triage that were performed during my shift and, if possible, patient and/or family made aware of any abnormal findings. ____________________________________________   PROCEDURES  Procedure(s) performed: None  Procedures  Critical Care performed: None  ____________________________________________   INITIAL IMPRESSION / ASSESSMENT AND PLAN / ED COURSE  Pertinent labs & imaging results that were available during my care of the patient were reviewed by me and considered in my medical decision making (see chart for details).  Patient with symptoms of UTI and perhaps early pyelonephritis. No history of kidney stones. She is well appearing, she has had a single dose of antibiotics for her urinary tract infection. She is concerned about the fact that she has not had a bowel movement for a few days however this is not concerning to me and given antibiotics we are giving her a do not think that a laxative will be of  use. I do not take her pain is from not having a bowel movement for 2 days. More concerning is her urinary tract infection which we will treat with a dose of Rocephin here as it may be starting to develop pyelonephritis. No evidence of infected kidney stone. We will continue the course of Cipro which she has already started, we'll send a urine culture. Patient is feeling much workup walked her IV fluids. Return precautions and follow-up given and understood.  Clinical Course   ____________________________________________   FINAL CLINICAL IMPRESSION(S) / ED DIAGNOSES  Final diagnoses:  None      This chart was dictated using voice recognition software.  Despite best efforts to proofread,  errors can occur which can change meaning.      Schuyler Amor, MD 11/01/15 [phone]

## 2015-11-01 NOTE — ED Triage Notes (Signed)
Patient presents to the ED with constipation.  Patient reports two bowel movements Monday but none since then.  Patient is taking cipro for a UTI and has been taking miralax for constipation with no improvement.  Patient is in no obvious distress at this time.

## 2015-11-01 NOTE — Patient Instructions (Addendum)
Estreimiento - Adultos (Constipation, Adult) Se llama constipacin cuando:  Elimina heces (defeca) menos de 3 veces por semana.  Tiene dificultad para defecar.  Las heces son secas y duras o son ms grandes que lo normal. CUIDADOS EN EL HOGAR   Consuma alimentos con alto contenido de [place]. Por ejemplo, frutas, vegetales, porotos y cereales integrales, como el arroz integral.  Evite los alimentos ricos en grasas y Location manager. Estos incluyen patatas fritas, hamburguesas, galletas, dulces y refrescos.  Si no consume suficientes alimentos ricos en fibras, tome productos que tengan agregado de fibra (suplementos).  Beba suficiente lquido para mantener el pis (orina) claro o de color [place] plido.  Haga ejercicio en forma regular, o como lo indique su mdico.  Vaya al bao cuando sienta la necesidad de Landscape architect. No se aguante las ganas.  Solo tome los medicamentos que le haya indicado su mdico. No tome medicamentos que le ayuden a Landscape architect (laxantes) sin antes consultarlo con su mdico. SOLICITE AYUDA DE INMEDIATO SI:   Observa sangre brillante en las heces (materia fecal).  El estreimiento dura ms de 4 das o [place].  Tiene dolor en el vientre (abdominal) o el trasero (recto).  Las heces son delgadas (como un lpiz).  Pierde peso de [place] inexplicable. ASEGRESE DE QUE:   Comprende estas instrucciones.  Controlar su afeccin.  Recibir ayuda de inmediato si no mejora o si empeora.   Esta informacin no tiene Marine scientist el consejo del mdico. Asegrese de hacerle al mdico cualquier pregunta que tenga.   Document Released: 04/26/2010 Document Revised: 04/14/2014 Elsevier Interactive Patient Education [employer].  Constipation, Adult Constipation is when a person:  Poops (has a bowel movement) less than 3 times a week.  Has a hard time pooping.  Has poop that is dry, hard, or bigger than normal. HOME CARE   Eat foods with a lot of fiber in  them. This includes fruits, vegetables, beans, and whole grains such as brown rice.  Avoid fatty foods and foods with a lot of sugar. This includes french fries, hamburgers, cookies, candy, and soda.  If you are not getting enough fiber from food, take products with added fiber in them (supplements).  Drink enough fluid to keep your pee (urine) clear or pale yellow.  Exercise on a regular basis, or as told by your doctor.  Go to the restroom when you feel like you need to poop. Do not hold it.  Only take medicine as told by your doctor. Do not take medicines that help you poop (laxatives) without talking to your doctor first. GET HELP RIGHT AWAY IF:   You have bright red blood in your poop (stool).  Your constipation lasts more than 4 days or gets worse.  You have belly (abdominal) or butt (rectal) pain.  You have thin poop (as thin as a pencil).  You lose weight, and it cannot be explained. MAKE SURE YOU:   Understand these instructions.  Will watch your condition.  Will get help right away if you are not doing well or get worse.   This information is not intended to replace advice given to you by your health care provider. Make sure you discuss any questions you have with your health care provider.   Document Released: 09/10/2007 Document Revised: 04/14/2014 Document Reviewed: 01/03/2013 Elsevier Interactive Patient Education [employer].

## 2015-11-01 NOTE — ED Notes (Addendum)
Pt reports that she has been constipated for 3 days - she states that her abd hurts and she is unable to eat - - she states last week she had an ear infection and diarrhea and she was rx'd amoxicillin and zofran - pt has already been seen at the doctor this am and was rx'd cipro for a UTI and polythylene glycol for constipation   (pt states she has taken twice without relief) - Pt states she hurts from her rectum up into her lower back

## 2015-11-01 NOTE — Progress Notes (Signed)
S: c/o constipation, had viral type illness last week with diarrhea, weakness, fever/chills, also had ear pain, was put on zofran and amoxil, now has constipation, stool is hard, last bm was 2 days ago, is having to strain, her abdomen hurts with pushing the stool out, also having some lower back pain, tried olive oil, baked apples to alleviate constipation, did not try a laxative, did not call pcp about problem  O: vitals wnl, nad, ENT wnl, neck supple no lymph, lungs c t a, cv rrr, abd mildly tender throughout, bs normal throughout, ua 1+ leuks, 2+ blood  A: resolved AOM, constipation, abdominal pain, uti  P: otc miralax 1 tbsp qd in clear liquid, preferably water, f/u with pcp if not improving in a few days, if abdominal pain is worsening then patient should go to ER

## 2015-11-02 ENCOUNTER — Ambulatory Visit (INDEPENDENT_AMBULATORY_CARE_PROVIDER_SITE_OTHER): Payer: 59 | Admitting: Family Medicine

## 2015-11-02 ENCOUNTER — Encounter: Payer: Self-pay | Admitting: Family Medicine

## 2015-11-02 VITALS — BP 102/70 | HR 82 | Temp 98.1°F | Wt 151.0 lb

## 2015-11-02 DIAGNOSIS — K5900 Constipation, unspecified: Secondary | ICD-10-CM | POA: Diagnosis not present

## 2015-11-02 DIAGNOSIS — N390 Urinary tract infection, site not specified: Secondary | ICD-10-CM

## 2015-11-02 NOTE — Progress Notes (Signed)
   BP 102/70   Pulse 82   Temp 98.1 F (36.7 C)   Wt 151 lb (68.5 kg)   SpO2 99%   BMI 25.92 kg/m    Subjective:    Patient ID: Karen Mess., female    DOB: 09-Feb-1971, 45 y.o.   MRN: [id number]  HPI: Karen Walker is a 45 y.o. female  Chief Complaint  Patient presents with  . Urinary Tract Infection    She went to the ED yesterday and was diagnosed with pyleonephritis. Given zofran and cipro. Still having difficult with urination.  . Constipation    She states she is having small amounts of stool. It is painful to "push" it out. She is taking Miralax.    Patient presents for ER f/u regarding a UTI and constipation. Urinary symptoms seem to be improving on cipro. Patient denies fever, chills, dysuria, or frequency. She has been taking miralax for 2 days with minimal relief of constipation. Having abdominal discomfort and rectal pain with BMs as they are hard. Denies hematochezia. Does have known external hemorroids but states they have not been a problem for her.  Relevant past medical, surgical, family and social history reviewed and updated as indicated. Interim medical history since our last visit reviewed. Allergies and medications reviewed and updated.  Review of Systems  Constitutional: Negative.   Respiratory: Negative.   Gastrointestinal: Positive for abdominal pain, constipation and rectal pain.  Genitourinary: Negative.   Musculoskeletal: Positive for myalgias.  Neurological: Negative.   Psychiatric/Behavioral: Negative.     Per HPI unless specifically indicated above     Objective:    BP 102/70   Pulse 82   Temp 98.1 F (36.7 C)   Wt 151 lb (68.5 kg)   SpO2 99%   BMI 25.92 kg/m   Wt Readings from Last 3 Encounters:  11/02/15 151 lb (68.5 kg)  11/01/15 157 lb (71.2 kg)  10/24/15 157 lb (71.2 kg)    Physical Exam  Constitutional: She is oriented to person, place, and time. She appears well-developed and well-nourished.  HENT:  Head: Atraumatic.    Eyes: Conjunctivae are normal. No scleral icterus.  Neck: Normal range of motion. Neck supple.  Cardiovascular: Normal heart sounds.   Pulmonary/Chest: Effort normal.  Abdominal: Soft. Bowel sounds are normal. She exhibits no distension. There is no tenderness.  Genitourinary: Rectal exam shows external hemorrhoid. Rectal exam shows no fissure.  Genitourinary Comments: Two external hemorrhoids noted, not thrombosed or bleeding.  Musculoskeletal: Normal range of motion.  Neurological: She is alert and oriented to person, place, and time.  Skin: Skin is warm and dry.  Psychiatric: She has a normal mood and affect. Her behavior is normal.  Nursing note and vitals reviewed.     Assessment & Plan:   Problem List Items Addressed This Visit    None    Visit Diagnoses    Constipation, unspecified constipation type    -  Primary   UTI (lower urinary tract infection)        Continue cipro and good hydration, patient to follow up right away for fever, severe back pain, or return of urinary symptoms.  Continue miralax daily, add metamucil and increase dietary intake of fruits and vegetables. Increase water intake. Can take colace on occasion for constipation flares.    Follow up plan: Return if symptoms worsen or fail to improve.

## 2015-11-02 NOTE — Patient Instructions (Addendum)
Colace tablets Metamucil - fiber supplement Take miralax and metamucil daily. Take colace for 1-2 days when very constipated.  Stop taking amoxicillin, continue cipro until completed.

## 2015-11-03 LAB — URINE CULTURE: Culture: NO GROWTH

## 2016-02-26 ENCOUNTER — Other Ambulatory Visit: Payer: Self-pay | Admitting: Emergency Medicine

## 2016-02-26 ENCOUNTER — Ambulatory Visit: Payer: Self-pay | Admitting: Physician Assistant

## 2016-02-26 ENCOUNTER — Encounter: Payer: Self-pay | Admitting: Physician Assistant

## 2016-02-26 VITALS — BP 112/74 | HR 80 | Temp 97.7°F

## 2016-02-26 DIAGNOSIS — K120 Recurrent oral aphthae: Secondary | ICD-10-CM

## 2016-02-26 MED ORDER — AMOXICILLIN 875 MG PO TABS: 875.0000 mg | ORAL_TABLET | Freq: Two times a day (BID) | ORAL | 0 refills | Status: DC

## 2016-02-26 MED ORDER — MAGIC MOUTHWASH: 5.0000 mL | Freq: Three times a day (TID) | ORAL | 0 refills | Status: DC | PRN

## 2016-02-26 NOTE — Progress Notes (Signed)
S: c/o sore on gum, is red and swollen, hit area with her toothbrush and now the area is infected, has lymph node on her neck that is swollen on same side, no fever or chills, sx for about 5 days, used otc meds without relief  O: vitals wnl, nad, mouth with large sore on lower r side, area surrounding the sore is red and swollen, no pus noted, neck supple + enlarged cervical nodes on r, lungs c t a ,c v rrr  A: infected canker sore  P: amoxil, dukes magic mouthwash

## 2016-04-08 ENCOUNTER — Encounter: Payer: Self-pay | Admitting: Physician Assistant

## 2016-04-08 ENCOUNTER — Ambulatory Visit: Payer: Self-pay | Admitting: Physician Assistant

## 2016-04-08 VITALS — BP 100/60 | HR 84 | Temp 97.7°F

## 2016-04-08 DIAGNOSIS — R52 Pain, unspecified: Secondary | ICD-10-CM

## 2016-04-08 DIAGNOSIS — J069 Acute upper respiratory infection, unspecified: Secondary | ICD-10-CM

## 2016-04-08 LAB — POCT INFLUENZA A/B
INFLUENZA B, POC: NEGATIVE
Influenza A, POC: NEGATIVE

## 2016-04-08 MED ORDER — AZITHROMYCIN 250 MG PO TABS: ORAL_TABLET | ORAL | 0 refills | Status: DC

## 2016-04-08 NOTE — Progress Notes (Signed)
S: C/o runny nose and congestion with dry cough for 1 days, no  fever, chills, denies cp/sob, v/d; some body aches, even her eyes hurt,  Using otc meds: robitussin  O: PE: vitals wnl, nad,  perrl eomi, normocephalic, tms dull, nasal mucosa red and swollen, throat injected, neck supple no lymph, lungs c t a, cv rrr, neuro intact, flu swab neg  A:  Acute flu like illness   P: drink fluids, continue regular meds , use otc meds of choice, return if not improving in 5 days, return earlier if worsening , zpack

## 2016-07-22 ENCOUNTER — Telehealth: Payer: Self-pay | Admitting: Family Medicine

## 2016-07-22 MED ORDER — ACYCLOVIR 800 MG PO TABS: 800.0000 mg | ORAL_TABLET | Freq: Two times a day (BID) | ORAL | 5 refills | Status: DC

## 2016-07-22 NOTE — Telephone Encounter (Signed)
Pt would like a refill for acyclovir (ZOVIRAX) 800 MG tablet sent to [facility].

## 2016-07-22 NOTE — Telephone Encounter (Signed)
Rx sent to her pharmacy 

## 2016-09-10 ENCOUNTER — Ambulatory Visit: Payer: Self-pay | Admitting: Physician Assistant

## 2016-09-10 ENCOUNTER — Encounter: Payer: Self-pay | Admitting: Physician Assistant

## 2016-09-10 VITALS — BP 100/70 | HR 80 | Temp 97.6°F

## 2016-09-10 DIAGNOSIS — J0100 Acute maxillary sinusitis, unspecified: Secondary | ICD-10-CM

## 2016-09-10 MED ORDER — FLUTICASONE PROPIONATE 50 MCG/ACT NA SUSP
2.0000 | Freq: Every day | NASAL | 6 refills | Status: DC
Start: 2016-09-10 — End: 2017-06-24

## 2016-09-10 MED ORDER — AMOXICILLIN 875 MG PO TABS: 875.0000 mg | ORAL_TABLET | Freq: Two times a day (BID) | ORAL | 0 refills | Status: DC

## 2016-09-10 MED ORDER — PREDNISONE 10 MG PO TABS: 30.0000 mg | ORAL_TABLET | Freq: Every day | ORAL | 0 refills | Status: DC

## 2016-09-10 NOTE — Progress Notes (Signed)
S: C/o runny nose and congestion for 3 days, no fever, chills, cp/sob, v/d; mucus is yellow and thick, cough is sporadic, c/o of facial and dental pain. Ear pain and decreased hearing  Using otc meds:   O: PE: vitals wnl, nad, perrl eomi, normocephalic, tms dull, nasal mucosa red and swollen, throat injected, neck supple no lymph, lungs c t a, cv rrr, neuro intact  A:  Acute sinusitis   P: drink fluids, continue regular meds , use otc meds of choice, return if not improving in 5 days, return earlier if worsening , amoxil, pred 30mg  qd x 3d, flonase

## 2016-09-19 ENCOUNTER — Encounter: Payer: Self-pay | Admitting: Family Medicine

## 2016-09-19 ENCOUNTER — Ambulatory Visit (INDEPENDENT_AMBULATORY_CARE_PROVIDER_SITE_OTHER): Payer: 59 | Admitting: Family Medicine

## 2016-09-19 VITALS — BP 102/67 | HR 76 | Temp 97.6°F | Wt 155.9 lb

## 2016-09-19 DIAGNOSIS — J0101 Acute recurrent maxillary sinusitis: Secondary | ICD-10-CM

## 2016-09-19 MED ORDER — AMOXICILLIN-POT CLAVULANATE 875-125 MG PO TABS: 1.0000 | ORAL_TABLET | Freq: Two times a day (BID) | ORAL | 0 refills | Status: DC

## 2016-09-19 MED ORDER — PREDNISONE 50 MG PO TABS: 50.0000 mg | ORAL_TABLET | Freq: Every day | ORAL | 0 refills | Status: DC

## 2016-09-19 NOTE — Progress Notes (Signed)
BP 102/67   Pulse 76   Temp 97.6 F (36.4 C)   Wt 155 lb 14.4 oz (70.7 kg)   SpO2 98%   BMI 26.76 kg/m    Subjective:    Patient ID: Karen Mess., female    DOB: [birth year], 46 y.o.   MRN: [id number]  HPI: Karen Walker is a 46 y.o. female  Chief Complaint  Patient presents with  . Facial Pain  . Sore Throat   UPPER RESPIRATORY TRACT INFECTION- saw employee health 9 days ago and was put on amoxil x7 days, prednisone x3 days and flonase. Duration: 12-14 days Worst symptom: sore throat, ear pain Fever: no Cough: yes Shortness of breath: yes Wheezing: no Chest pain: no Chest tightness: no Chest congestion: no Nasal congestion: yes Runny nose: yes Post nasal drip: yes Sneezing: yes Sore throat: yes Swollen glands: yes Sinus pressure: yes Headache: yes Face pain: yes Toothache: yes Ear pain: yes "right Ear pressure: yes bilateral Eyes red/itching:yes Eye drainage/crusting: no  Vomiting: no Rash: no Fatigue: yes Sick contacts: yes Strep contacts: no  Context: better- but not where she needs to be Recurrent sinusitis: no Relief with OTC cold/cough medications: no  Treatments attempted: cold/sinus, mucinex, anti-histamine, pseudoephedrine, cough syrup and antibiotics    Relevant past medical, surgical, family and social history reviewed and updated as indicated. Interim medical history since our last visit reviewed. Allergies and medications reviewed and updated.  Review of Systems  Constitutional: Positive for fatigue. Negative for activity change, appetite change, chills, diaphoresis, fever and unexpected weight change.  HENT: Positive for congestion, ear pain, hearing loss, postnasal drip, rhinorrhea, sinus pain, sinus pressure, sneezing and sore throat. Negative for dental problem, drooling, ear discharge, facial swelling, mouth sores, nosebleeds, tinnitus, trouble swallowing and voice change.   Respiratory: Positive for shortness of breath. Negative  for apnea, cough, choking, chest tightness, wheezing and stridor.   Cardiovascular: Negative.   Psychiatric/Behavioral: Negative.     Per HPI unless specifically indicated above     Objective:    BP 102/67   Pulse 76   Temp 97.6 F (36.4 C)   Wt 155 lb 14.4 oz (70.7 kg)   SpO2 98%   BMI 26.76 kg/m   Wt Readings from Last 3 Encounters:  09/19/16 155 lb 14.4 oz (70.7 kg)  11/02/15 151 lb (68.5 kg)  11/01/15 157 lb (71.2 kg)    Physical Exam  Constitutional: She is oriented to person, place, and time. She appears well-developed and well-nourished. No distress.  HENT:  Head: Normocephalic and atraumatic.  Right Ear: Hearing, tympanic membrane, external ear and ear canal normal.  Left Ear: Hearing, tympanic membrane, external ear and ear canal normal.  Nose: Mucosal edema and rhinorrhea present. Right sinus exhibits maxillary sinus tenderness. Right sinus exhibits no frontal sinus tenderness. Left sinus exhibits maxillary sinus tenderness. Left sinus exhibits no frontal sinus tenderness.  Mouth/Throat: Uvula is midline, oropharynx is clear and moist and mucous membranes are normal. No oropharyngeal exudate.  Eyes: Conjunctivae, EOM and lids are normal. Pupils are equal, round, and reactive to light. Right eye exhibits no discharge. Left eye exhibits no discharge. No scleral icterus.  Neck: Normal range of motion. Neck supple. No JVD present. No tracheal deviation present. No thyromegaly present.  Cardiovascular: Normal rate, regular rhythm, normal heart sounds and intact distal pulses.  Exam reveals no gallop and no friction rub.   No murmur heard. Pulmonary/Chest: Effort normal and breath sounds normal. No stridor. No respiratory  distress. She has no wheezes. She has no rales. She exhibits no tenderness.  Musculoskeletal: Normal range of motion.  Lymphadenopathy:    She has cervical adenopathy.  Neurological: She is alert and oriented to person, place, and time.  Skin: Skin is  warm, dry and intact. No rash noted. She is not diaphoretic. No erythema. No pallor.  Psychiatric: She has a normal mood and affect. Her speech is normal and behavior is normal. Judgment and thought content normal. Cognition and memory are normal.  Nursing note and vitals reviewed.   Results for orders placed or performed in visit on 04/08/16  POCT Influenza A/B (POC66)  Result Value Ref Range   Influenza A, POC Negative Negative   Influenza B, POC Negative Negative      Assessment & Plan:   Problem List Items Addressed This Visit    None    Visit Diagnoses    Acute recurrent maxillary sinusitis    -  Primary   Will treat with 5 days of prednisone and augmentin. Call if not getting better or getting worse. Continue flonse.    Relevant Medications   predniSONE (DELTASONE) 50 MG tablet   amoxicillin-clavulanate (AUGMENTIN) 875-125 MG tablet       Follow up plan: Return if symptoms worsen or fail to improve.

## 2016-10-23 DIAGNOSIS — Z85820 Personal history of malignant melanoma of skin: Secondary | ICD-10-CM | POA: Diagnosis not present

## 2016-10-23 DIAGNOSIS — L219 Seborrheic dermatitis, unspecified: Secondary | ICD-10-CM | POA: Diagnosis not present

## 2016-10-23 DIAGNOSIS — D229 Melanocytic nevi, unspecified: Secondary | ICD-10-CM | POA: Diagnosis not present

## 2016-10-23 DIAGNOSIS — L812 Freckles: Secondary | ICD-10-CM | POA: Diagnosis not present

## 2016-10-23 DIAGNOSIS — I8393 Asymptomatic varicose veins of bilateral lower extremities: Secondary | ICD-10-CM | POA: Diagnosis not present

## 2016-10-23 DIAGNOSIS — Z1283 Encounter for screening for malignant neoplasm of skin: Secondary | ICD-10-CM | POA: Diagnosis not present

## 2016-10-23 DIAGNOSIS — L821 Other seborrheic keratosis: Secondary | ICD-10-CM | POA: Diagnosis not present

## 2016-10-23 DIAGNOSIS — L299 Pruritus, unspecified: Secondary | ICD-10-CM | POA: Diagnosis not present

## 2016-11-14 ENCOUNTER — Ambulatory Visit (INDEPENDENT_AMBULATORY_CARE_PROVIDER_SITE_OTHER): Payer: 59 | Admitting: Family Medicine

## 2016-11-14 ENCOUNTER — Encounter: Payer: Self-pay | Admitting: Family Medicine

## 2016-11-14 VITALS — BP 108/71 | HR 75 | Temp 97.8°F | Wt 157.0 lb

## 2016-11-14 DIAGNOSIS — M722 Plantar fascial fibromatosis: Secondary | ICD-10-CM | POA: Diagnosis not present

## 2016-11-14 DIAGNOSIS — M25552 Pain in left hip: Secondary | ICD-10-CM

## 2016-11-14 DIAGNOSIS — M25551 Pain in right hip: Secondary | ICD-10-CM | POA: Diagnosis not present

## 2016-11-14 MED ORDER — PREDNISONE 10 MG PO TABS: ORAL_TABLET | ORAL | 0 refills | Status: DC

## 2016-11-14 NOTE — Progress Notes (Signed)
BP 108/71   Pulse 75   Temp 97.8 F (36.6 C)   Wt 157 lb (71.2 kg)   SpO2 98%   BMI 26.95 kg/m    Subjective:    Patient ID: Karen Mess., female    DOB: [birth year], 46 y.o.   MRN: [id number]  HPI: Karen Walker is a 46 y.o. female  Chief Complaint  Patient presents with  . Hip Pain    x 3 months, biateral but left is worse.  . Foot Pain    x 2 weeks,left heel pain. pain eases off once she starts moving around, when she rests, she will have sharp pain.   Patient presents with 3 months of b/l hip aching pain. Worst on the left. Walks a lot for her job. No known injury, fever, joint swelling or redness. Has not tried anything for the pain. Seems to improve after being up and moving som.   Also having about 2 weeks of sharp left heel pain that seems to improve after moving around. Can hardly bear weight first thing in the morning. Has never had this happen before. Has tried ice with some relief.   Past Medical History:  Diagnosis Date  . Allergic rhinitis   . Anxiety   . Depression   . History of skin cancer   . Hx of migraine headaches    light sensivity, unilateral HA   Social History   Social History  . Marital status: Married    Spouse name: N/A  . Number of children: N/A  . Years of education: N/A   Occupational History  . Not on file.   Social History Main Topics  . Smoking status: Never Smoker  . Smokeless tobacco: Never Used  . Alcohol use No  . Drug use: No  . Sexual activity: Yes    Birth control/ protection: Surgical   Other Topics Concern  . Not on file   Social History Narrative  . No narrative on file    Relevant past medical, surgical, family and social history reviewed and updated as indicated. Interim medical history since our last visit reviewed. Allergies and medications reviewed and updated.  Review of Systems  Constitutional: Negative.   HENT: Negative.   Respiratory: Negative.   Cardiovascular: Negative.     Gastrointestinal: Negative.   Genitourinary: Negative.   Musculoskeletal: Positive for arthralgias.  Neurological: Negative.   Psychiatric/Behavioral: Negative.    Per HPI unless specifically indicated above     Objective:    BP 108/71   Pulse 75   Temp 97.8 F (36.6 C)   Wt 157 lb (71.2 kg)   SpO2 98%   BMI 26.95 kg/m   Wt Readings from Last 3 Encounters:  11/14/16 157 lb (71.2 kg)  09/19/16 155 lb 14.4 oz (70.7 kg)  11/02/15 151 lb (68.5 kg)    Physical Exam  Constitutional: She is oriented to person, place, and time. She appears well-developed and well-nourished. No distress.  HENT:  Head: Atraumatic.  Eyes: Pupils are equal, round, and reactive to light. Conjunctivae are normal. No scleral icterus.  Neck: Normal range of motion. Neck supple.  Cardiovascular: Normal rate and normal heart sounds.   Pulmonary/Chest: Effort normal and breath sounds normal.  Musculoskeletal: Normal range of motion.  B/l hips nontender to palpation with good strength and ROM Left heel tender with flexion and palpation from achilles attachment to ball of foot  Neurological: She is alert and oriented to person, place, and time.  Skin: Skin is warm and dry.  Psychiatric: She has a normal mood and affect. Her behavior is normal.  Nursing note and vitals reviewed.     Assessment & Plan:   Problem List Items Addressed This Visit    None    Visit Diagnoses    Pain of both hip joints    -  Primary   Suspect arthritic, will get some labs to r/o autoimmune given b/l nature. NSAIDs and tylenol prn, epsom salt soaks, supportive shoes, stretches   Plantar fasciitis       Discsussed orthotic shoes or inserts, nighttime braces, rolling foot on a ball or frozen water bottle, epsom salt soaks. Prednisone sent for further relief       Follow up plan: Return if symptoms worsen or fail to improve.

## 2016-11-17 NOTE — Patient Instructions (Signed)
Follow up as needed

## 2016-12-11 ENCOUNTER — Encounter: Payer: Self-pay | Admitting: Family Medicine

## 2016-12-11 ENCOUNTER — Ambulatory Visit (INDEPENDENT_AMBULATORY_CARE_PROVIDER_SITE_OTHER): Payer: 59 | Admitting: Family Medicine

## 2016-12-11 VITALS — BP 109/73 | HR 89 | Temp 98.8°F | Wt 156.0 lb

## 2016-12-11 DIAGNOSIS — N898 Other specified noninflammatory disorders of vagina: Secondary | ICD-10-CM

## 2016-12-11 DIAGNOSIS — R52 Pain, unspecified: Secondary | ICD-10-CM

## 2016-12-11 LAB — WET PREP FOR TRICH, YEAST, CLUE
Clue Cell Exam: NEGATIVE
Trichomonas Exam: NEGATIVE
Yeast Exam: NEGATIVE

## 2016-12-11 MED ORDER — FLUCONAZOLE 150 MG PO TABS: 150.0000 mg | ORAL_TABLET | Freq: Once | ORAL | 0 refills | Status: AC

## 2016-12-11 NOTE — Progress Notes (Signed)
BP 109/73   Pulse 89   Temp 98.8 F (37.1 C)   Wt 156 lb (70.8 kg)   SpO2 97%   BMI 26.78 kg/m    Subjective:    Patient ID: Karen Mess., female    DOB: [birth year], 46 y.o.   MRN: [id number]  HPI: Karen Walker is a 46 y.o. female  Chief Complaint  Patient presents with  . Generalized Body Aches    x 1 day, body aches and headache, throat hurts some.   . Vaginitis    x 4 days, itching, discharge   Patient with 1 day of body aches, HA, sore throat, nausea. Has not taken anything for symptoms. Denies known fever, vomiting, diarrhea, congestion, cough. Several sick contacts known.   Thin yellow-ish vaginal discharge and itching for almost a week. Denies dysuria, lesions, bleeding, abdominal pain. Has not tried anything OTC.   Past Medical History:  Diagnosis Date  . Allergic rhinitis   . Anxiety   . Depression   . History of skin cancer   . Hx of migraine headaches    light sensivity, unilateral HA   Social History   Social History  . Marital status: Married    Spouse name: N/A  . Number of children: N/A  . Years of education: N/A   Occupational History  . Not on file.   Social History Main Topics  . Smoking status: Never Smoker  . Smokeless tobacco: Never Used  . Alcohol use No  . Drug use: No  . Sexual activity: Yes    Birth control/ protection: Surgical   Other Topics Concern  . Not on file   Social History Narrative  . No narrative on file   Relevant past medical, surgical, family and social history reviewed and updated as indicated. Interim medical history since our last visit reviewed. Allergies and medications reviewed and updated.  Review of Systems  Constitutional: Negative.   HENT: Positive for sore throat.   Respiratory: Negative.   Cardiovascular: Negative.   Gastrointestinal: Positive for nausea. Negative for diarrhea and vomiting.  Genitourinary: Positive for vaginal discharge.  Musculoskeletal: Positive for myalgias.    Neurological: Positive for headaches.  Psychiatric/Behavioral: Negative.     Per HPI unless specifically indicated above     Objective:    BP 109/73   Pulse 89   Temp 98.8 F (37.1 C)   Wt 156 lb (70.8 kg)   SpO2 97%   BMI 26.78 kg/m   Wt Readings from Last 3 Encounters:  12/12/16 155 lb (70.3 kg)  12/11/16 156 lb (70.8 kg)  11/14/16 157 lb (71.2 kg)    Physical Exam  Constitutional: She is oriented to person, place, and time. She appears well-developed and well-nourished. No distress.  HENT:  Head: Atraumatic.  Nose: Nose normal.  Mouth/Throat: Oropharynx is clear and moist. No oropharyngeal exudate.  Eyes: Pupils are equal, round, and reactive to light. Conjunctivae are normal. No scleral icterus.  Neck: Normal range of motion. Neck supple.  Cardiovascular: Normal rate and normal heart sounds.   Pulmonary/Chest: Effort normal and breath sounds normal. No respiratory distress.  Genitourinary: Vagina normal. No vaginal discharge found.  Musculoskeletal: Normal range of motion.  Neurological: She is alert and oriented to person, place, and time.  Skin: Skin is warm and dry.  Psychiatric: She has a normal mood and affect. Her behavior is normal.  Nursing note and vitals reviewed.     Assessment & Plan:   Problem List  Items Addressed This Visit    None    Visit Diagnoses    Body aches    -  Primary   Afebrile, non-specific. Suspect viral given chills, HA, sore throat. Tylenol and ibuprofen prn, push fluids, rest. F/u if worsening or no improvement   Vaginal discharge       Wet prep negative, will give a dose of diflucan in case any yeast that was not picked up in the swab. Probiotics, good vaginal hygiene reviewed   Relevant Orders   WET PREP FOR [place], YEAST, CLUE (Completed)       Follow up plan: Return if symptoms worsen or fail to improve.

## 2016-12-12 ENCOUNTER — Encounter (INDEPENDENT_AMBULATORY_CARE_PROVIDER_SITE_OTHER): Payer: Self-pay | Admitting: Vascular Surgery

## 2016-12-12 ENCOUNTER — Ambulatory Visit (INDEPENDENT_AMBULATORY_CARE_PROVIDER_SITE_OTHER): Payer: 59 | Admitting: Vascular Surgery

## 2016-12-12 VITALS — BP 100/70 | HR 89 | Resp 16 | Ht 65.0 in | Wt 155.0 lb

## 2016-12-12 DIAGNOSIS — I83813 Varicose veins of bilateral lower extremities with pain: Secondary | ICD-10-CM

## 2016-12-12 DIAGNOSIS — M7989 Other specified soft tissue disorders: Secondary | ICD-10-CM | POA: Diagnosis not present

## 2016-12-12 NOTE — Progress Notes (Signed)
Patient ID: Karen Mess., female   DOB: 03/15/1971, 46 y.o.   MRN: [id number]  Chief Complaint  Patient presents with  . New Patient (Initial Visit)    Varicose Veins    HPI Karen Paulo. is a 46 y.o. female.  I am asked to see the patient by Dr. Ivor Costa for evaluation of varicose veins.  The patient presents with complaints of symptomatic varicosities of the legs. The patient reports a long standing history of varicosities and they have become painful over time. There was no clear inciting event or causative factor that started the symptoms.  The left leg is slightly more severly affected. The patient elevates the legs for relief. The Walker is described as aching and heaviness in the lower legs, particularly the inside of the legs. The symptoms are generally most severe in the evening, particularly when they have been on their feet for long periods of time. Elevation and OTC support stockings have been used to try to improve the symptoms with limited success. The patient complains of frequent swelling as an associated symptom. The patient has no previous history of deep venous thrombosis or superficial thrombophlebitis to their knowledge.     Past Medical History:  Diagnosis Date  . Allergic rhinitis   . Anxiety   . Depression   . History of skin cancer   . Hx of migraine headaches    light sensivity, unilateral HA    Past Surgical History:  Procedure Laterality Date  . ABDOMINAL HYSTERECTOMY  2014  . BREAST CYST ASPIRATION Right 2013   negative. Done at Dr. Dwyane Luo office  . HERNIA REPAIR  2014  . hysterectemy    . melonoma removal on R medial arch of foot  2008  . UMBILICAL HERNIA REPAIR     occurred with hysterectemy    Family History  Problem Relation Age of Onset  . Stroke Father     Social History Social History  Substance Use Topics  . Smoking status: Never Smoker  . Smokeless tobacco: Never Used  . Alcohol use No    Allergies  Allergen Reactions   . Codeine Other (See Comments)  . Pollen Extract     Current Outpatient Prescriptions  Medication Sig Dispense Refill  . Calcium Carbonate (CALCIUM 600 PO) Take 600 mg by mouth daily.    . Cyanocobalamin (B-12 PO) Take by mouth daily.    . fluticasone (FLONASE) 50 MCG/ACT nasal spray Place 2 sprays into both nostrils daily. (Patient not taking: Reported on 11/14/2016) 16 g 6  . ketoconazole (NIZORAL) 2 % shampoo as needed.  5   No current facility-administered medications for this visit.       REVIEW OF SYSTEMS (Negative unless checked)  Constitutional: [] Weight loss  [] Fever  [] Chills Cardiac: [] Chest Walker   [] Chest pressure   [] Palpitations   [] Shortness of breath when laying flat   [] Shortness of breath at rest   [] Shortness of breath with exertion. Vascular:  [] Walker in legs with walking   [] Walker in legs at rest   [] Walker in legs when laying flat   [] Claudication   [] Walker in feet when walking  [] Walker in feet at rest  [] Walker in feet when laying flat   [] History of DVT   [] Phlebitis   [x] Swelling in legs   [x] Varicose veins   [] Non-healing ulcers Pulmonary:   [] Uses home oxygen   [] Productive cough   [] Hemoptysis   [] Wheeze  [] COPD   [] Asthma Neurologic:  [] Dizziness  []   Blackouts   [] Seizures   [] History of stroke   [] History of TIA  [] Aphasia   [] Temporary blindness   [] Dysphagia   [] Weakness or numbness in arms   [] Weakness or numbness in legs Musculoskeletal:  [] Arthritis   [] Joint swelling   [] Joint Walker   [] Low back Walker Hematologic:  [] Easy bruising  [] Easy bleeding   [] Hypercoagulable state   [] Anemic  [] Hepatitis Gastrointestinal:  [] Blood in stool   [] Vomiting blood  [] Gastroesophageal reflux/heartburn   [] Abdominal Walker Genitourinary:  [] Chronic kidney disease   [] Difficult urination  [] Frequent urination  [] Burning with urination   [] Hematuria Skin:  [] Rashes   [] Ulcers   [] Wounds Psychological:  [x] History of anxiety   [x]  History of major depression.    Physical Exam BP  100/70 (BP Location: Right Arm)   Pulse 89   Resp 16   Ht 5\' 5"  (1.651 m)   Wt 70.3 kg (155 lb)   BMI 25.79 kg/m  Gen:  WD/WN, NAD Head: [place]/AT, No temporalis wasting.  Ear/Nose/Throat: Hearing grossly intact, dentition good Eyes: Sclera non-icteric. Conjunctiva clear Neck: Supple, no nuchal rigidity. Trachea midline Pulmonary:  Good air movement, no use of accessory muscles, respirations not labored.  Cardiac: RRR, No JVD Vascular: Varicosities diffuse and measuring up to 2 mm in the right lower extremity        Varicosities diffuse and measuring up to 2 mm in the left lower extremity Vessel Right Left  Radial Palpable Palpable                          PT Palpable Palpable  DP Palpable Palpable   Gastrointestinal: soft, non-tender/non-distended.  Musculoskeletal: M/S 5/5 throughout.   Trace bilateral LE edema Neurologic: Sensation grossly intact in extremities.  Symmetrical.  Speech is fluent.  Psychiatric: Judgment intact, Mood & affect appropriate for pt's clinical situation. Dermatologic: No rashes or ulcers noted.  No cellulitis or open wounds.    Radiology No results found.  Labs Recent Results (from the past 2160 hour(s))  WET PREP FOR [place], YEAST, CLUE     Status: None   Collection Time: 12/11/16  3:52 PM  Result Value Ref Range   Trichomonas Exam Negative Negative   Yeast Exam Negative Negative   Clue Cell Exam Negative Negative    Assessment/Plan:  Swelling of limb Likely from venous disease  Varicose veins of leg with Walker, bilateral   The patient has symptoms consistent with chronic venous insufficiency. We discussed the natural history and treatment options for venous disease. I recommended the regular use of 20 - 30 mm Hg compression stockings, and prescribed these today. I recommended leg elevation and anti-inflammatories as needed for Walker. I have also recommended a complete venous duplex to assess the venous system for reflux or thrombotic  issues. This can be done at the patient's convenience. I will see the patient back in 3 months to assess the response to conservative management, and determine further treatment options.     Karen Walker 12/12/2016, 4:30 PM   This note was created with Dragon medical transcription system.  Any errors from dictation are unintentional.

## 2016-12-12 NOTE — Assessment & Plan Note (Signed)
Likely from venous disease 

## 2016-12-12 NOTE — Patient Instructions (Signed)
Varicose Veins Varicose veins are veins that have become enlarged and twisted. They are usually seen in the legs but can occur in other parts of the body as well. What are the causes? This condition is the result of valves in the veins not working properly. Valves in the veins help to return blood from the leg to the heart. If these valves are damaged, blood flows backward and backs up into the veins in the leg near the skin. This causes the veins to become larger. What increases the risk? People who are on their feet a lot, who are pregnant, or who are overweight are more likely to develop varicose veins. What are the signs or symptoms?  Bulging, twisted-appearing, bluish veins, most commonly found on the legs.  Leg pain or a feeling of heaviness. These symptoms may be worse at the end of the day.  Leg swelling.  Changes in skin color. How is this diagnosed? A health care provider can usually diagnose varicose veins by examining your legs. Your health care provider may also recommend an ultrasound of your leg veins. How is this treated? Most varicose veins can be treated at home.However, other treatments are available for people who have persistent symptoms or want to improve the cosmetic appearance of the varicose veins. These treatment options include:  Sclerotherapy. A solution is injected into the vein to close it off.  Laser treatment. A laser is used to heat the vein to close it off.  Radiofrequency vein ablation. An electrical current produced by radio waves is used to close off the vein.  Phlebectomy. The vein is surgically removed through small incisions made over the varicose vein.  Vein ligation and stripping. The vein is surgically removed through incisions made over the varicose vein after the vein has been tied (ligated). Follow these instructions at home:   Do not stand or sit in one position for long periods of time. Do not sit with your legs crossed. Rest with your  legs raised during the day.  Wear compression stockings as directed by your health care provider. These stockings help to prevent blood clots and reduce swelling in your legs.  Do not wear other tight, encircling garments around your legs, pelvis, or waist.  Walk as much as possible to increase blood flow.  Raise the foot of your bed at night with 2-inch blocks.  If you get a cut in the skin over the vein and the vein bleeds, lie down with your leg raised and press on it with a clean cloth until the bleeding stops. Then place a bandage (dressing) on the cut. See your health care provider if it continues to bleed. Contact a health care provider if:  The skin around your ankle starts to break down.  You have pain, redness, tenderness, or hard swelling in your leg over a vein.  You are uncomfortable because of leg pain. This information is not intended to replace advice given to you by your health care provider. Make sure you discuss any questions you have with your health care provider. Document Released: 01/01/2005 Document Revised: 08/30/2015 Document Reviewed: 09/25/2015 Elsevier Interactive Patient Education  2017 [employer]  

## 2016-12-14 NOTE — Patient Instructions (Signed)
Follow up if no improvement 

## 2016-12-25 ENCOUNTER — Encounter: Payer: Self-pay | Admitting: Family Medicine

## 2016-12-25 ENCOUNTER — Ambulatory Visit (INDEPENDENT_AMBULATORY_CARE_PROVIDER_SITE_OTHER): Payer: 59 | Admitting: Family Medicine

## 2016-12-25 VITALS — BP 103/70 | HR 87 | Resp 14 | Ht 65.0 in | Wt 156.0 lb

## 2016-12-25 DIAGNOSIS — N898 Other specified noninflammatory disorders of vagina: Secondary | ICD-10-CM | POA: Diagnosis not present

## 2016-12-25 DIAGNOSIS — R0602 Shortness of breath: Secondary | ICD-10-CM | POA: Diagnosis not present

## 2016-12-25 DIAGNOSIS — M722 Plantar fascial fibromatosis: Secondary | ICD-10-CM | POA: Diagnosis not present

## 2016-12-25 DIAGNOSIS — K602 Anal fissure, unspecified: Secondary | ICD-10-CM | POA: Diagnosis not present

## 2016-12-25 LAB — WET PREP FOR TRICH, YEAST, CLUE
Clue Cell Exam: NEGATIVE
Trichomonas Exam: NEGATIVE
Yeast Exam: NEGATIVE

## 2016-12-25 MED ORDER — HYDROCORTISONE ACETATE 25 MG RE SUPP: 25.0000 mg | Freq: Two times a day (BID) | RECTAL | 0 refills | Status: DC

## 2016-12-25 MED ORDER — NAPROXEN 500 MG PO TABS: 500.0000 mg | ORAL_TABLET | Freq: Two times a day (BID) | ORAL | 0 refills | Status: DC

## 2016-12-25 NOTE — Progress Notes (Signed)
BP 103/70   Pulse 87   Resp 14   Ht 5\' 5"  (1.651 m)   Wt 156 lb (70.8 kg)   BMI 25.96 kg/m    Subjective:    Patient ID: Karen Mess., female    DOB: Aug 12, 1970, 46 y.o.   MRN: [id number]  HPI: Karen Walker is a 46 y.o. female  Chief Complaint  Patient presents with  . Foot Pain   VAGINAL DISCHARGE- treated for yeast infection about 2 weeks ago, still having a bit of discharge Duration: 2 weeks Discharge description: clear  Pruritus: yes Dysuria: no Malodorous: no Urinary frequency: yes- at night time Fevers: no Abdominal pain: yes  Sexual activity: monogamous History of sexually transmitted diseases: no Recent antibiotic use: no Context: better  Treatments attempted: diflucan  Had some pressure in her back and some pins and needles last night, had trouble sleeping. Had some trouble catching her breath. Feeling a little better today. She feels like her breathing isn't right  FOOT PAIN Duration: more than 2 months Involved foot: bilateral Mechanism of injury: unknown Location: heels Onset: gradual  Severity: moderate  Quality:  Aching, sore Frequency: constant Radiation: no Aggravating factors: walking at work   Alleviating factors: nothing   Status: worse Treatments attempted: ice, heat, insoles, rolling her foot, has not taken any medicine   Relief with NSAIDs?:  No NSAIDs Taken Weakness with weight bearing or walking: yes Morning stiffness: yes Swelling: no Redness: no Bruising: no Paresthesias / decreased sensation: yes  Fevers:no   Relevant past medical, surgical, family and social history reviewed and updated as indicated. Interim medical history since our last visit reviewed. Allergies and medications reviewed and updated.  Review of Systems  Constitutional: Negative.   Respiratory: Positive for chest tightness and shortness of breath. Negative for apnea, cough, choking, wheezing and stridor.   Cardiovascular: Negative.     Gastrointestinal: Positive for anal bleeding, constipation and rectal pain. Negative for abdominal distention, abdominal pain, blood in stool, diarrhea, nausea and vomiting.  Genitourinary: Positive for vaginal discharge. Negative for decreased urine volume, difficulty urinating, dyspareunia, dysuria, enuresis, flank pain, frequency, genital sores, hematuria, menstrual problem, pelvic pain, urgency, vaginal bleeding and vaginal pain.  Musculoskeletal: Positive for arthralgias and myalgias. Negative for back pain, gait problem, joint swelling, neck pain and neck stiffness.  Psychiatric/Behavioral: Negative.     Per HPI unless specifically indicated above     Objective:    BP 103/70   Pulse 87   Resp 14   Ht 5\' 5"  (1.651 m)   Wt 156 lb (70.8 kg)   BMI 25.96 kg/m   Wt Readings from Last 3 Encounters:  12/25/16 156 lb (70.8 kg)  12/12/16 155 lb (70.3 kg)  12/11/16 156 lb (70.8 kg)    Physical Exam  Constitutional: She is oriented to person, place, and time. She appears well-developed and well-nourished. No distress.  HENT:  Head: Normocephalic and atraumatic.  Right Ear: Hearing normal.  Left Ear: Hearing normal.  Nose: Nose normal.  Eyes: Conjunctivae and lids are normal. Right eye exhibits no discharge. Left eye exhibits no discharge. No scleral icterus.  Cardiovascular: Normal rate, regular rhythm, normal heart sounds and intact distal pulses.  Exam reveals no gallop and no friction rub.   No murmur heard. Pulmonary/Chest: Effort normal and breath sounds normal. No respiratory distress. She has no wheezes. She has no rales. She exhibits no tenderness.  Abdominal: Hernia confirmed negative in the right inguinal area and confirmed negative  in the left inguinal area.  Genitourinary: No labial fusion. There is no rash, tenderness, lesion or injury on the right labia. There is no rash, tenderness, lesion or injury on the left labia. No erythema, tenderness or bleeding in the vagina. No  foreign body in the vagina. No signs of injury around the vagina. Vaginal discharge found.  Musculoskeletal: Normal range of motion. She exhibits tenderness (bilateral plantar fascia).  Neurological: She is alert and oriented to person, place, and time.  Skin: Skin is warm, dry and intact. No rash noted. She is not diaphoretic. No erythema. No pallor.  Psychiatric: She has a normal mood and affect. Her speech is normal and behavior is normal. Judgment and thought content normal. Cognition and memory are normal.  Nursing note and vitals reviewed.   Results for orders placed or performed in visit on 12/11/16  WET PREP FOR [place], YEAST, CLUE  Result Value Ref Range   Trichomonas Exam Negative Negative   Yeast Exam Negative Negative   Clue Cell Exam Negative Negative      Assessment & Plan:   Problem List Items Addressed This Visit    None    Visit Diagnoses    Plantar fasciitis    -  Primary   Bilateral. Will treat with braces and naproxen. Given chronicity- will get her into see podiatry. Referral generated today.    Relevant Orders   Ambulatory referral to Podiatry   Anal fissure       Will treat with supositories. Increase water intake, call if continuing to be constipated.    SOB (shortness of breath)       Spiro negative. Likely anxiety. Continue to monitor and call with any concerns. Recheck at physical in about a month.    Relevant Orders   Spirometry with Graph (Completed)   Vaginal discharge       Wet prep negative. Call with any concerns.    Relevant Orders   WET PREP FOR [place], YEAST, CLUE       Follow up plan: Return in about 4 weeks (around 01/22/2017) for Physical.

## 2016-12-25 NOTE — Patient Instructions (Addendum)
Fascitis plantar con rehabilitación  Plantar Fasciitis Rehab  Consulte al médico qué ejercicios son seguros para usted. Haga los ejercicios exactamente como se lo haya indicado el médico y gradúelos como se lo hayan indicado. Es normal sentir tirantez, tensión, presión o molestias leves mientras hace estos ejercicios, pero debe detenerse de inmediato si siente dolor repentino o si el dolor empeora. No comience a hacer estos ejercicios hasta que se lo indique el médico.  EJERCICIOS DE ELONGACIÓN Y AMPLITUD DE MOVIMIENTOS  Estos ejercicios calientan los músculos y las articulaciones, y mejoran la movilidad y la flexibilidad del pie. Además, ayudan a aliviar el dolor.  Ejercicio A: Estiramiento de la fascia plantar  1. Siéntese con la pierna izquierda/derecha cruzada sobre la rodilla opuesta.  2. Sostenga el talón con una mano, con el pulgar cerca del arco. Con la otra mano, sostenga los dedos de los pies y empújelos con cuidado hacia arriba. Debe sentir un estiramiento en la parte de abajo de los dedos o del pie, o de ambos.  3. Mantenga esta posición durante _________ segundos.  4. Afloje lentamente los dedos y vuelva a la posición inicial.  Repita __________ veces. Realice este ejercicio __________ veces al día.  Ejercicio B: Músculos gemelos, de pie  1. Párese con las manos apoyadas sobre la pared.  2. Extienda la pierna izquierda/derecha hacia atrás y flexione ligeramente la rodilla de la pierna de adelante.  3. Mantenga los talones en el suelo y la rodilla de atrás extendida, y pase el peso hacia la pared, sin arquear la espalda. Debe sentir un estiramiento suave en la pantorrilla izquierda/derecha.  4. Mantenga esta posición durante ___________ segundos.  Repita __________ veces. Realice este ejercicio __________ veces al día.  Ejercicio C: Músculo sóleo, de pie  1. Párese con las manos apoyadas sobre la pared.  2. Extienda la pierna izquierda/derecha hacia atrás y flexione ligeramente  la rodilla de la pierna de adelante.  3. Mantenga los talones apoyados en el suelo, flexione la rodilla de atrás y lleve el peso ligeramente a la pierna de atrás. Debe sentir un estiramiento suave en la parte profunda de la pantorrilla.  4. Mantenga esta posición durante ___________ segundos.  Repita __________ veces. Realice este ejercicio __________ veces al día.  Ejercicio D: Músculos gemelos y sóleo, de pie  1. Párese sobre un escalón apoyando solo la región metatarsiana de su pie derecho/izquierdo. La parte metatarsiana de la planta del pie es la superficie sobre la que caminamos, justo debajo de los dedos.  2. Mantenga el otro pie apoyado con firmeza en el mismo escalón.  3. Sosténgase de la pared o de una baranda para mantener el equilibrio.  4. Levante lentamente el otro pie, y permita que el peso del cuerpo presione el talón sobre el borde del escalón. Debe sentir un estiramiento en la pantorrilla izquierda/derecha.  5. Mantenga esta posición durante ___________ segundos.  6. Vuelva a poner ambos pies sobre el escalón.  7. Repita este ejercicio con una leve flexión en la rodilla izquierda/derecha.  Repítalo __________ veces con la rodilla izquierda/derecha extendida y __________ veces con la rodilla izquierda/derecha flexionada. Realice este ejercicio __________ veces al día.  EJERCICIO DE EQUILIBRIO  Este ejercicio aumenta el equilibrio y el control de la fuerza del arco, para ayudar a reducir la presión sobre la fascia plantar.  Ejercicio E: Pararse en una sola pierna  1. Sin calzado, párese cerca de una baranda o una puerta. Puede sostenerse de la baranda o del marco de la puerta, según lo necesite.    2. Párese sobre el pie izquierdo/derecho. Sin despegar el dedo gordo del suelo, intente mantener el arco levantado. No deje que el pie se vaya hacia adentro.  3. Mantenga esta posición durante ___________ segundos.  4. Si este ejercicio es muy fácil, puede intentar hacerlo con los ojos  cerrados o parado sobre una almohada.  Repita __________ veces. Realice este ejercicio __________ veces al día.  Esta información no tiene como fin reemplazar el consejo del médico. Asegúrese de hacerle al médico cualquier pregunta que tenga.  Document Released: 01/08/2006 Document Revised: 08/08/2014 Document Reviewed: 02/05/2015  Elsevier Interactive Patient Education © 2018 [employer]

## 2016-12-26 ENCOUNTER — Encounter: Payer: Self-pay | Admitting: Family Medicine

## 2016-12-26 ENCOUNTER — Telehealth: Payer: Self-pay | Admitting: Family Medicine

## 2016-12-26 ENCOUNTER — Ambulatory Visit (INDEPENDENT_AMBULATORY_CARE_PROVIDER_SITE_OTHER): Payer: 59 | Admitting: Family Medicine

## 2016-12-26 VITALS — BP 102/68 | HR 76 | Temp 97.5°F | Resp 16 | Ht 65.0 in | Wt 155.6 lb

## 2016-12-26 DIAGNOSIS — R1030 Lower abdominal pain, unspecified: Secondary | ICD-10-CM | POA: Diagnosis not present

## 2016-12-26 DIAGNOSIS — Z0000 Encounter for general adult medical examination without abnormal findings: Secondary | ICD-10-CM

## 2016-12-26 DIAGNOSIS — M2550 Pain in unspecified joint: Secondary | ICD-10-CM | POA: Diagnosis not present

## 2016-12-26 DIAGNOSIS — M791 Myalgia, unspecified site: Secondary | ICD-10-CM

## 2016-12-26 DIAGNOSIS — F331 Major depressive disorder, recurrent, moderate: Secondary | ICD-10-CM

## 2016-12-26 DIAGNOSIS — Z1239 Encounter for other screening for malignant neoplasm of breast: Secondary | ICD-10-CM

## 2016-12-26 DIAGNOSIS — F339 Major depressive disorder, recurrent, unspecified: Secondary | ICD-10-CM | POA: Insufficient documentation

## 2016-12-26 NOTE — Telephone Encounter (Signed)
Per provider, she will discuss this at her appt time.

## 2016-12-26 NOTE — Progress Notes (Signed)
BP 102/68 (BP Location: Left Arm, Patient Position: Sitting)   Pulse 76   Temp (!) 97.5 F (36.4 C)   Resp 16   Ht '5\' 5"'$  (1.651 m)   Wt 155 lb 9.6 oz (70.6 kg)   BMI 25.89 kg/m    Subjective:    Patient ID: Karen Mess., female    DOB: 1970/09/20, 46 y.o.   MRN: [id number]  HPI: Karen Walker is a 46 y.o. female presenting on 12/26/2016 for comprehensive medical examination. Current medical complaints include:  ABDOMINAL ISSUES Duration: chronic Nature: bloating and burning Location: LLQ and RLQ  Severity: mild  Radiation: no Episode duration: few minutes Frequency: intermittent Alleviating factors: peeing Aggravating factors: holding her urine Treatments attempted: none Constipation: intermittent Diarrhea: no Mucous in the stool: no Heartburn: no Bloating:yes Flatulence: no Nausea: yes Vomiting: no Melena or hematochezia: no Rash: no Jaundice: no Fever: no Weight loss: no  Has been really aching and not feeling like herself.   She currently lives with: husband and kids Menopausal Symptoms: yes  Depression Screen done today and results listed below:  Depression screen PHQ 2/9 12/26/2016  Decreased Interest 3  Down, Depressed, Hopeless 3  PHQ - 2 Score 6  Altered sleeping 1  Tired, decreased energy 2  Change in appetite 3  Feeling bad or failure about yourself  3  Trouble concentrating 1  Moving slowly or fidgety/restless 2  Suicidal thoughts 1  PHQ-9 Score 19  Difficult doing work/chores Very difficult    Past Medical History:  Past Medical History:  Diagnosis Date  . Allergic rhinitis   . Anxiety   . Depression   . History of skin cancer   . Hx of migraine headaches    light sensivity, unilateral HA    Surgical History:  Past Surgical History:  Procedure Laterality Date  . ABDOMINAL HYSTERECTOMY  2014  . BREAST CYST ASPIRATION Right 2013   negative. Done at Dr. Dwyane Luo office  . HERNIA REPAIR  2014  . hysterectemy    . melonoma  removal on R medial arch of foot  2008  . UMBILICAL HERNIA REPAIR     occurred with hysterectemy    Medications:  Current Outpatient Prescriptions on File Prior to Visit  Medication Sig  . Calcium Carbonate (CALCIUM 600 PO) Take 600 mg by mouth daily.  . Cyanocobalamin (B-12 PO) Take by mouth daily.  . fluticasone (FLONASE) 50 MCG/ACT nasal spray Place 2 sprays into both nostrils daily.  . hydrocortisone (ANUSOL-HC) 25 MG suppository Place 1 suppository (25 mg total) rectally 2 (two) times daily.  Marland Kitchen ketoconazole (NIZORAL) 2 % shampoo as needed.  . naproxen (NAPROSYN) 500 MG tablet Take 1 tablet (500 mg total) by mouth 2 (two) times daily with a meal.   No current facility-administered medications on file prior to visit.     Allergies:  Allergies  Allergen Reactions  . Codeine Other (See Comments)  . Pollen Extract     Social History:  Social History   Social History  . Marital status: Married    Spouse name: N/A  . Number of children: N/A  . Years of education: N/A   Occupational History  . Not on file.   Social History Main Topics  . Smoking status: Never Smoker  . Smokeless tobacco: Never Used  . Alcohol use No  . Drug use: No  . Sexual activity: Yes    Birth control/ protection: Surgical   Other Topics Concern  . Not  on file   Social History Narrative  . No narrative on file   History  Smoking Status  . Never Smoker  Smokeless Tobacco  . Never Used   History  Alcohol Use No    Family History:  Family History  Problem Relation Age of Onset  . Stroke Father     Past medical history, surgical history, medications, allergies, family history and social history reviewed with patient today and changes made to appropriate areas of the chart.   Review of Systems  Constitutional: Negative.   HENT: Negative.   Eyes: Positive for blurred vision (not wearing her glasses because she lost them). Negative for double vision, photophobia, pain, discharge and  redness.  Respiratory: Positive for cough and shortness of breath. Negative for hemoptysis, sputum production and wheezing.   Cardiovascular: Positive for leg swelling. Negative for chest pain, palpitations, orthopnea, claudication and PND.  Gastrointestinal: Positive for abdominal pain. Negative for blood in stool, constipation, diarrhea, heartburn, melena, nausea and vomiting.       Feels like something gets stuck when she swollowing in her neck  Genitourinary: Negative.   Musculoskeletal: Positive for back pain, joint pain, myalgias and neck pain. Negative for falls.  Skin: Negative.        Skin tag under R arm  Neurological: Negative.   Endo/Heme/Allergies: Negative.   Psychiatric/Behavioral: Positive for depression. Negative for hallucinations, memory loss, substance abuse and suicidal ideas. The patient is not nervous/anxious and does not have insomnia.     All other ROS negative except what is listed above and in the HPI.      Objective:    BP 102/68 (BP Location: Left Arm, Patient Position: Sitting)   Pulse 76   Temp (!) 97.5 F (36.4 C)   Resp 16   Ht 5\' 5"  (1.651 m)   Wt 155 lb 9.6 oz (70.6 kg)   BMI 25.89 kg/m   Wt Readings from Last 3 Encounters:  12/26/16 155 lb 9.6 oz (70.6 kg)  12/25/16 156 lb (70.8 kg)  12/12/16 155 lb (70.3 kg)    Physical Exam  Constitutional: She is oriented to person, place, and time. She appears well-developed and well-nourished. No distress.  HENT:  Head: Normocephalic and atraumatic.  Right Ear: Hearing, tympanic membrane, external ear and ear canal normal.  Left Ear: Hearing, tympanic membrane, external ear and ear canal normal.  Nose: Nose normal.  Mouth/Throat: Uvula is midline, oropharynx is clear and moist and mucous membranes are normal. No oropharyngeal exudate.  Eyes: Pupils are equal, round, and reactive to light. Conjunctivae, EOM and lids are normal. Right eye exhibits no discharge. Left eye exhibits no discharge. No scleral  icterus.  Neck: Normal range of motion. Neck supple. No JVD present. No tracheal deviation present. No thyromegaly present.  Cardiovascular: Normal rate, regular rhythm, normal heart sounds and intact distal pulses.  Exam reveals no gallop and no friction rub.   No murmur heard. Pulmonary/Chest: Effort normal and breath sounds normal. No stridor. No respiratory distress. She has no wheezes. She has no rales. She exhibits no tenderness. Right breast exhibits no inverted nipple, no mass, no nipple discharge, no skin change and no tenderness. Left breast exhibits no inverted nipple, no mass, no nipple discharge, no skin change and no tenderness. Breasts are symmetrical.  Abdominal: Soft. Bowel sounds are normal. She exhibits no distension and no mass. There is no tenderness. There is no rebound and no guarding.  Genitourinary:  Genitourinary Comments: Pelvic exam deferred with  shared decision making  Musculoskeletal: Normal range of motion. She exhibits no edema, tenderness or deformity.  Lymphadenopathy:    She has no cervical adenopathy.  Neurological: She is alert and oriented to person, place, and time. She has normal reflexes. She displays normal reflexes. No cranial nerve deficit. She exhibits normal muscle tone. Coordination normal.  Skin: Skin is warm, dry and intact. No rash noted. She is not diaphoretic. No erythema. No pallor.  Psychiatric: Her speech is normal and behavior is normal. Judgment and thought content normal. Cognition and memory are normal. She exhibits a depressed mood.  Nursing note and vitals reviewed.   Results for orders placed or performed in visit on 12/25/16  WET PREP FOR [place], YEAST, CLUE  Result Value Ref Range   Trichomonas Exam Negative Negative   Yeast Exam Negative Negative   Clue Cell Exam Negative Negative      Assessment & Plan:   Problem List Items Addressed This Visit      Other   Major depression, recurrent ([place])    Does not want to go on  medication right now. Will check labs and follow up in 1 month, if not better, consider medication.        Other Visit Diagnoses    Routine general medical examination at a health care facility    -  Primary   Vaccines up to date. Screening labs checked today. Pap N/A. Mammogram ordered. Call with any concerns.    Relevant Orders   CBC with Differential/Platelet   Comprehensive metabolic panel   Lipid Panel w/o Chol/HDL Ratio   TSH   UA/M w/rflx Culture, Routine   Screening for breast cancer       Mammogram ordered today.   Relevant Orders   MM DIGITAL SCREENING BILATERAL   Myalgia       Chronic. Has not had any work up for rheumatologic issues. Will check labs today. Await results. Treat as needed. Call with any concerns.    Relevant Orders   Antinuclear Antib (ANA)   RA Qn+CCP(IgG/A)+SjoSSA+SjoSSB   Sed Rate (ESR)   C-reactive protein   Lyme Ab/Western Blot Reflex   Ehrlichia Antibody Panel   Babesia microti Antibody Panel   Rocky mtn spotted fvr abs pnl(IgG+IgM)   VITAMIN D 25 Hydroxy (Vit-D Deficiency, Fractures)   Arthralgia, unspecified joint       Chronic. Has not had any work up for rheumatologic issues. Will check labs today. Await results. Treat as needed. Call with any concerns.    Relevant Orders   Antinuclear Antib (ANA)   RA Qn+CCP(IgG/A)+SjoSSA+SjoSSB   Sed Rate (ESR)   C-reactive protein   Lyme Ab/Western Blot Reflex   Ehrlichia Antibody Panel   Babesia microti Antibody Panel   Rocky mtn spotted fvr abs pnl(IgG+IgM)   Lower abdominal pain       Checking labs today- if nothing clear on this, will obtain pelvic ultrasound. Recheck 1 month.        Follow up plan: Return in about 4 weeks (around 01/23/2017) for follow up belly pain and foot pain and mood.   LABORATORY TESTING:  - Pap smear: not applicable- hysterectomy for endometriosis  IMMUNIZATIONS:   - Tdap: Tetanus vaccination status reviewed: last tetanus booster within 10 years. - Influenza: Up  to date - Pneumovax: Not applicable  SCREENING: -Mammogram: Ordered today   PATIENT COUNSELING:   Advised to take 1 mg of folate supplement per day if capable of pregnancy.   Sexuality: Discussed sexually  transmitted diseases, partner selection, use of condoms, avoidance of unintended pregnancy  and contraceptive alternatives.    I discussed with the patient that most people either abstain from alcohol or drink within safe limits (<=14/week and <=4 drinks/occasion for males, <=7/weeks and <= 3 drinks/occasion for females) and that the risk for alcohol disorders and other health effects rises proportionally with the number of drinks per week and how often a drinker exceeds daily limits.  Discussed cessation/primary prevention of drug use and availability of treatment for abuse.   Diet: Encouraged to adjust caloric intake to maintain  or achieve ideal body weight, to reduce intake of dietary saturated fat and total fat, to limit sodium intake by avoiding high sodium foods and not adding table salt, and to maintain adequate dietary potassium and calcium preferably from fresh fruits, vegetables, and low-fat dairy products.    stressed the importance of regular exercise  Injury prevention: Discussed safety belts, safety helmets, smoke detector, smoking near bedding or upholstery.   Dental health: Discussed importance of regular tooth brushing, flossing, and dental visits.    NEXT PREVENTATIVE PHYSICAL DUE IN 1 YEAR. Return in about 4 weeks (around 01/23/2017) for follow up belly pain and foot pain and mood.

## 2016-12-26 NOTE — Assessment & Plan Note (Signed)
Does not want to go on medication right now. Will check labs and follow up in 1 month, if not better, consider medication.

## 2016-12-26 NOTE — Patient Instructions (Addendum)
Health Maintenance, Female Adopting a healthy lifestyle and getting preventive care can go a long way to promote health and wellness. Talk with your health care provider about what schedule of regular examinations is right for you. This is a good chance for you to check in with your provider about disease prevention and staying healthy. In between checkups, there are plenty of things you can do on your own. Experts have done a lot of research about which lifestyle changes and preventive measures are most likely to keep you healthy. Ask your health care provider for more information. Weight and diet Eat a healthy diet  Be sure to include plenty of vegetables, fruits, low-fat dairy products, and lean protein.  Do not eat a lot of foods high in solid fats, added sugars, or salt.  Get regular exercise. This is one of the most important things you can do for your health. ? Most adults should exercise for at least 150 minutes each week. The exercise should increase your heart rate and make you sweat (moderate-intensity exercise). ? Most adults should also do strengthening exercises at least twice a week. This is in addition to the moderate-intensity exercise.  Maintain a healthy weight  Body mass index (BMI) is a measurement that can be used to identify possible weight problems. It estimates body fat based on height and weight. Your health care provider can help determine your BMI and help you achieve or maintain a healthy weight.  For females 20 years of age and older: ? A BMI below 18.5 is considered underweight. ? A BMI of 18.5 to 24.9 is normal. ? A BMI of 25 to 29.9 is considered overweight. ? A BMI of 30 and above is considered obese.  Watch levels of cholesterol and blood lipids  You should start having your blood tested for lipids and cholesterol at 47 years of age, then have this test every 5 years.  You may need to have your cholesterol levels checked more often if: ? Your lipid or  cholesterol levels are high. ? You are older than 47 years of age. ? You are at high risk for heart disease.  Cancer screening Lung Cancer  Lung cancer screening is recommended for adults 55-80 years old who are at high risk for lung cancer because of a history of smoking.  A yearly low-dose CT scan of the lungs is recommended for people who: ? Currently smoke. ? Have quit within the past 15 years. ? Have at least a 30-pack-year history of smoking. A pack year is smoking an average of one pack of cigarettes a day for 1 year.  Yearly screening should continue until it has been 15 years since you quit.  Yearly screening should stop if you develop a health problem that would prevent you from having lung cancer treatment.  Breast Cancer  Practice breast self-awareness. This means understanding how your breasts normally appear and feel.  It also means doing regular breast self-exams. Let your health care provider know about any changes, no matter how small.  If you are in your 20s or 30s, you should have a clinical breast exam (CBE) by a health care provider every 1-3 years as part of a regular health exam.  If you are 40 or older, have a CBE every year. Also consider having a breast X-ray (mammogram) every year.  If you have a family history of breast cancer, talk to your health care provider about genetic screening.  If you are at high risk   for breast cancer, talk to your health care provider about having an MRI and a mammogram every year.  Breast cancer gene (BRCA) assessment is recommended for women who have family members with BRCA-related cancers. BRCA-related cancers include: ? Breast. ? Ovarian. ? Tubal. ? Peritoneal cancers.  Results of the assessment will determine the need for genetic counseling and BRCA1 and BRCA2 testing.  Cervical Cancer Your health care provider may recommend that you be screened regularly for cancer of the pelvic organs (ovaries, uterus, and  vagina). This screening involves a pelvic examination, including checking for microscopic changes to the surface of your cervix (Pap test). You may be encouraged to have this screening done every 3 years, beginning at age 22.  For women ages 56-65, health care providers may recommend pelvic exams and Pap testing every 3 years, or they may recommend the Pap and pelvic exam, combined with testing for human papilloma virus (HPV), every 5 years. Some types of HPV increase your risk of cervical cancer. Testing for HPV may also be done on women of any age with unclear Pap test results.  Other health care providers may not recommend any screening for nonpregnant women who are considered low risk for pelvic cancer and who do not have symptoms. Ask your health care provider if a screening pelvic exam is right for you.  If you have had past treatment for cervical cancer or a condition that could lead to cancer, you need Pap tests and screening for cancer for at least 20 years after your treatment. If Pap tests have been discontinued, your risk factors (such as having a new sexual partner) need to be reassessed to determine if screening should resume. Some women have medical problems that increase the chance of getting cervical cancer. In these cases, your health care provider may recommend more frequent screening and Pap tests.  Colorectal Cancer  This type of cancer can be detected and often prevented.  Routine colorectal cancer screening usually begins at 47 years of age and continues through 47 years of age.  Your health care provider may recommend screening at an earlier age if you have risk factors for colon cancer.  Your health care provider may also recommend using home test kits to check for hidden blood in the stool.  A small camera at the end of a tube can be used to examine your colon directly (sigmoidoscopy or colonoscopy). This is done to check for the earliest forms of colorectal  cancer.  Routine screening usually begins at age 33.  Direct examination of the colon should be repeated every 5-10 years through 47 years of age. However, you may need to be screened more often if early forms of precancerous polyps or small growths are found.  Skin Cancer  Check your skin from head to toe regularly.  Tell your health care provider about any new moles or changes in moles, especially if there is a change in a mole's shape or color.  Also tell your health care provider if you have a mole that is larger than the size of a pencil eraser.  Always use sunscreen. Apply sunscreen liberally and repeatedly throughout the day.  Protect yourself by wearing long sleeves, pants, a wide-brimmed hat, and sunglasses whenever you are outside.  Heart disease, diabetes, and high blood pressure  High blood pressure causes heart disease and increases the risk of stroke. High blood pressure is more likely to develop in: ? People who have blood pressure in the high end of  the normal range (130-139/85-89 mm Hg). ? People who are overweight or obese. ? People who are African American.  If you are 21-29 years of age, have your blood pressure checked every 3-5 years. If you are 3 years of age or older, have your blood pressure checked every year. You should have your blood pressure measured twice-once when you are at a hospital or clinic, and once when you are not at a hospital or clinic. Record the average of the two measurements. To check your blood pressure when you are not at a hospital or clinic, you can use: ? An automated blood pressure machine at a pharmacy. ? A home blood pressure monitor.  If you are between 17 years and 37 years old, ask your health care provider if you should take aspirin to prevent strokes.  Have regular diabetes screenings. This involves taking a blood sample to check your fasting blood sugar level. ? If you are at a normal weight and have a low risk for diabetes,  have this test once every three years after 47 years of age. ? If you are overweight and have a high risk for diabetes, consider being tested at a younger age or more often. Preventing infection Hepatitis B  If you have a higher risk for hepatitis B, you should be screened for this virus. You are considered at high risk for hepatitis B if: ? You were born in a country where hepatitis B is common. Ask your health care provider which countries are considered high risk. ? Your parents were born in a high-risk country, and you have not been immunized against hepatitis B (hepatitis B vaccine). ? You have HIV or AIDS. ? You use needles to inject street drugs. ? You live with someone who has hepatitis B. ? You have had sex with someone who has hepatitis B. ? You get hemodialysis treatment. ? You take certain medicines for conditions, including cancer, organ transplantation, and autoimmune conditions.  Hepatitis C  Blood testing is recommended for: ? Everyone born from [birth year] through 1966. ? Anyone with known risk factors for hepatitis C.  Sexually transmitted infections (STIs)  You should be screened for sexually transmitted infections (STIs) including gonorrhea and chlamydia if: ? You are sexually active and are younger than 47 years of age. ? You are older than 47 years of age and your health care provider tells you that you are at risk for this type of infection. ? Your sexual activity has changed since you were last screened and you are at an increased risk for chlamydia or gonorrhea. Ask your health care provider if you are at risk.  If you do not have HIV, but are at risk, it may be recommended that you take a prescription medicine daily to prevent HIV infection. This is called pre-exposure prophylaxis (PrEP). You are considered at risk if: ? You are sexually active and do not regularly use condoms or know the HIV status of your partner(s). ? You take drugs by injection. ? You are  sexually active with a partner who has HIV.  Talk with your health care provider about whether you are at high risk of being infected with HIV. If you choose to begin PrEP, you should first be tested for HIV. You should then be tested every 3 months for as long as you are taking PrEP. Pregnancy  If you are premenopausal and you may become pregnant, ask your health care provider about preconception counseling.  If you may become  pregnant, take 400 to 800 micrograms (mcg) of folic acid every day.  If you want to prevent pregnancy, talk to your health care provider about birth control (contraception). Osteoporosis and menopause  Osteoporosis is a disease in which the bones lose minerals and strength with aging. This can result in serious bone fractures. Your risk for osteoporosis can be identified using a bone density scan.  If you are 28 years of age or older, or if you are at risk for osteoporosis and fractures, ask your health care provider if you should be screened.  Ask your health care provider whether you should take a calcium or vitamin D supplement to lower your risk for osteoporosis.  Menopause may have certain physical symptoms and risks.  Hormone replacement therapy may reduce some of these symptoms and risks. Talk to your health care provider about whether hormone replacement therapy is right for you. Follow these instructions at home:  Schedule regular health, dental, and eye exams.  Stay current with your immunizations.  Do not use any tobacco products including cigarettes, chewing tobacco, or electronic cigarettes.  If you are pregnant, do not drink alcohol.  If you are breastfeeding, limit how much and how often you drink alcohol.  Limit alcohol intake to no more than 1 drink per day for nonpregnant women. One drink equals 12 ounces of beer, 5 ounces of wine, or 1 ounces of hard liquor.  Do not use street drugs.  Do not share needles.  Ask your health care  provider for help if you need support or information about quitting drugs.  Tell your health care provider if you often feel depressed.  Tell your health care provider if you have ever been abused or do not feel safe at home. This information is not intended to replace advice given to you by your health care provider. Make sure you discuss any questions you have with your health care provider. Document Released: 10/07/2010 Document Revised: 08/30/2015 Document Reviewed: 12/26/2014 Elsevier Interactive Patient Education  2018 [place] Maintenance for Postmenopausal Women Menopause is a normal process in which your reproductive ability comes to an end. This process happens gradually over a span of months to years, usually between the ages of 96 and 42. Menopause is complete when you have missed 12 consecutive menstrual periods. It is important to talk with your health care provider about some of the most common conditions that affect postmenopausal women, such as heart disease, cancer, and bone loss (osteoporosis). Adopting a healthy lifestyle and getting preventive care can help to promote your health and wellness. Those actions can also lower your chances of developing some of these common conditions. What should I know about menopause? During menopause, you may experience a number of symptoms, such as:  Moderate-to-severe hot flashes.  Night sweats.  Decrease in sex drive.  Mood swings.  Headaches.  Tiredness.  Irritability.  Memory problems.  Insomnia.  Choosing to treat or not to treat menopausal changes is an individual decision that you make with your health care provider. What should I know about hormone replacement therapy and supplements? Hormone therapy products are effective for treating symptoms that are associated with menopause, such as hot flashes and night sweats. Hormone replacement carries certain risks, especially as you become older. If you are  thinking about using estrogen or estrogen with progestin treatments, discuss the benefits and risks with your health care provider. What should I know about heart disease and stroke? Heart disease, heart attack, and stroke  become more likely as you age. This may be due, in part, to the hormonal changes that your body experiences during menopause. These can affect how your body processes dietary fats, triglycerides, and cholesterol. Heart attack and stroke are both medical emergencies. There are many things that you can do to help prevent heart disease and stroke:  Have your blood pressure checked at least every 1-2 years. High blood pressure causes heart disease and increases the risk of stroke.  If you are 55-79 years old, ask your health care provider if you should take aspirin to prevent a heart attack or a stroke.  Do not use any tobacco products, including cigarettes, chewing tobacco, or electronic cigarettes. If you need help quitting, ask your health care provider.  It is important to eat a healthy diet and maintain a healthy weight. ? Be sure to include plenty of vegetables, fruits, low-fat dairy products, and lean protein. ? Avoid eating foods that are high in solid fats, added sugars, or salt (sodium).  Get regular exercise. This is one of the most important things that you can do for your health. ? Try to exercise for at least 150 minutes each week. The type of exercise that you do should increase your heart rate and make you sweat. This is known as moderate-intensity exercise. ? Try to do strengthening exercises at least twice each week. Do these in addition to the moderate-intensity exercise.  Know your numbers.Ask your health care provider to check your cholesterol and your blood glucose. Continue to have your blood tested as directed by your health care provider.  What should I know about cancer screening? There are several types of cancer. Take the following steps to reduce  your risk and to catch any cancer development as early as possible. Breast Cancer  Practice breast self-awareness. ? This means understanding how your breasts normally appear and feel. ? It also means doing regular breast self-exams. Let your health care provider know about any changes, no matter how small.  If you are 40 or older, have a clinician do a breast exam (clinical breast exam or CBE) every year. Depending on your age, family history, and medical history, it may be recommended that you also have a yearly breast X-ray (mammogram).  If you have a family history of breast cancer, talk with your health care provider about genetic screening.  If you are at high risk for breast cancer, talk with your health care provider about having an MRI and a mammogram every year.  Breast cancer (BRCA) gene test is recommended for women who have family members with BRCA-related cancers. Results of the assessment will determine the need for genetic counseling and BRCA1 and for BRCA2 testing. BRCA-related cancers include these types: ? Breast. This occurs in males or females. ? Ovarian. ? Tubal. This may also be called fallopian tube cancer. ? Cancer of the abdominal or pelvic lining (peritoneal cancer). ? Prostate. ? Pancreatic.  Cervical, Uterine, and Ovarian Cancer Your health care provider may recommend that you be screened regularly for cancer of the pelvic organs. These include your ovaries, uterus, and vagina. This screening involves a pelvic exam, which includes checking for microscopic changes to the surface of your cervix (Pap test).  For women ages 21-65, health care providers may recommend a pelvic exam and a Pap test every three years. For women ages 30-65, they may recommend the Pap test and pelvic exam, combined with testing for human papilloma virus (HPV), every five years. Some types   of HPV increase your risk of cervical cancer. Testing for HPV may also be done on women of any age who  have unclear Pap test results.  Other health care providers may not recommend any screening for nonpregnant women who are considered low risk for pelvic cancer and have no symptoms. Ask your health care provider if a screening pelvic exam is right for you.  If you have had past treatment for cervical cancer or a condition that could lead to cancer, you need Pap tests and screening for cancer for at least 20 years after your treatment. If Pap tests have been discontinued for you, your risk factors (such as having a new sexual partner) need to be reassessed to determine if you should start having screenings again. Some women have medical problems that increase the chance of getting cervical cancer. In these cases, your health care provider may recommend that you have screening and Pap tests more often.  If you have a family history of uterine cancer or ovarian cancer, talk with your health care provider about genetic screening.  If you have vaginal bleeding after reaching menopause, tell your health care provider.  There are currently no reliable tests available to screen for ovarian cancer.  Lung Cancer Lung cancer screening is recommended for adults 20-34 years old who are at high risk for lung cancer because of a history of smoking. A yearly low-dose CT scan of the lungs is recommended if you:  Currently smoke.  Have a history of at least 30 pack-years of smoking and you currently smoke or have quit within the past 15 years. A pack-year is smoking an average of one pack of cigarettes per day for one year.  Yearly screening should:  Continue until it has been 15 years since you quit.  Stop if you develop a health problem that would prevent you from having lung cancer treatment.  Colorectal Cancer  This type of cancer can be detected and can often be prevented.  Routine colorectal cancer screening usually begins at age 91 and continues through age 40.  If you have risk factors for colon  cancer, your health care provider may recommend that you be screened at an earlier age.  If you have a family history of colorectal cancer, talk with your health care provider about genetic screening.  Your health care provider may also recommend using home test kits to check for hidden blood in your stool.  A small camera at the end of a tube can be used to examine your colon directly (sigmoidoscopy or colonoscopy). This is done to check for the earliest forms of colorectal cancer.  Direct examination of the colon should be repeated every 5-10 years until age 59. However, if early forms of precancerous polyps or small growths are found or if you have a family history or genetic risk for colorectal cancer, you may need to be screened more often.  Skin Cancer  Check your skin from head to toe regularly.  Monitor any moles. Be sure to tell your health care provider: ? About any new moles or changes in moles, especially if there is a change in a mole's shape or color. ? If you have a mole that is larger than the size of a pencil eraser.  If any of your family members has a history of skin cancer, especially at a young age, talk with your health care provider about genetic screening.  Always use sunscreen. Apply sunscreen liberally and repeatedly throughout the day.  Whenever you are outside, protect yourself by wearing long sleeves, pants, a wide-brimmed hat, and sunglasses.  What should I know about osteoporosis? Osteoporosis is a condition in which bone destruction happens more quickly than new bone creation. After menopause, you may be at an increased risk for osteoporosis. To help prevent osteoporosis or the bone fractures that can happen because of osteoporosis, the following is recommended:  If you are 13-49 years old, get at least 1,000 mg of calcium and at least 600 mg of vitamin D per day.  If you are older than age 83 but younger than age 38, get at least 1,200 mg of calcium and  at least 600 mg of vitamin D per day.  If you are older than age 77, get at least 1,200 mg of calcium and at least 800 mg of vitamin D per day.  Smoking and excessive alcohol intake increase the risk of osteoporosis. Eat foods that are rich in calcium and vitamin D, and do weight-bearing exercises several times each week as directed by your health care provider. What should I know about how menopause affects my mental health? Depression may occur at any age, but it is more common as you become older. Common symptoms of depression include:  Low or sad mood.  Changes in sleep patterns.  Changes in appetite or eating patterns.  Feeling an overall lack of motivation or enjoyment of activities that you previously enjoyed.  Frequent crying spells.  Talk with your health care provider if you think that you are experiencing depression. What should I know about immunizations? It is important that you get and maintain your immunizations. These include:  Tetanus, diphtheria, and pertussis (Tdap) booster vaccine.  Influenza every year before the flu season begins.  Pneumonia vaccine.  Shingles vaccine.  Your health care provider may also recommend other immunizations. This information is not intended to replace advice given to you by your health care provider. Make sure you discuss any questions you have with your health care provider. Document Released: 05/16/2005 Document Revised: 10/12/2015 Document Reviewed: 12/26/2014 Elsevier Interactive Patient Education  2018 [employer].

## 2016-12-26 NOTE — Telephone Encounter (Signed)
Patient having trouble finding the splints in prescription at pharmacies in [place], [place]. Patient would like to know where she could get the splints; as labeled in the prescription; from.  Please Advise.  Thank you  Patient scheduled for physical at 3:00 12/26/2016.  Patient would like to see if she could speak wioth provider if possible before her appt. 12/26/2016.

## 2016-12-28 LAB — MICROSCOPIC EXAMINATION: Bacteria, UA: NONE SEEN

## 2016-12-28 LAB — UA/M W/RFLX CULTURE, ROUTINE
BILIRUBIN UA: NEGATIVE
Glucose, UA: NEGATIVE
KETONES UA: NEGATIVE
Nitrite, UA: NEGATIVE
PROTEIN UA: NEGATIVE
Specific Gravity, UA: 1.01 (ref 1.005–1.030)
UUROB: 0.2 mg/dL (ref 0.2–1.0)
pH, UA: 7 (ref 5.0–7.5)

## 2016-12-28 LAB — URINE CULTURE, REFLEX

## 2016-12-30 ENCOUNTER — Telehealth: Payer: Self-pay | Admitting: Family Medicine

## 2016-12-30 LAB — COMPREHENSIVE METABOLIC PANEL
ALBUMIN: 4.6 g/dL (ref 3.5–5.5)
ALK PHOS: 66 IU/L (ref 39–117)
ALT: 16 IU/L (ref 0–32)
AST: 19 IU/L (ref 0–40)
Albumin/Globulin Ratio: 1.5 (ref 1.2–2.2)
BUN/Creatinine Ratio: 18 (ref 9–23)
BUN: 12 mg/dL (ref 6–24)
Bilirubin Total: 0.4 mg/dL (ref 0.0–1.2)
CALCIUM: 9.9 mg/dL (ref 8.7–10.2)
CHLORIDE: 99 mmol/L (ref 96–106)
CO2: 26 mmol/L (ref 20–29)
Creatinine, Ser: 0.65 mg/dL (ref 0.57–1.00)
GFR calc Af Amer: 123 mL/min/{1.73_m2} (ref >59)
GFR calc non Af Amer: 107 mL/min/{1.73_m2} (ref >59)
GLOBULIN, TOTAL: 3 g/dL (ref 1.5–4.5)
GLUCOSE: 64 mg/dL — AB (ref 65–99)
POTASSIUM: 3.9 mmol/L (ref 3.5–5.2)
SODIUM: 139 mmol/L (ref 134–144)
TOTAL PROTEIN: 7.6 g/dL (ref 6.0–8.5)

## 2016-12-30 LAB — CBC WITH DIFFERENTIAL/PLATELET
BASOS ABS: 0 10*3/uL (ref 0.0–0.2)
Basos: 0 %
EOS (ABSOLUTE): 0.3 10*3/uL (ref 0.0–0.4)
EOS: 5 %
HEMATOCRIT: 40 % (ref 34.0–46.6)
HEMOGLOBIN: 13.3 g/dL (ref 11.1–15.9)
IMMATURE GRANS (ABS): 0 10*3/uL (ref 0.0–0.1)
Immature Granulocytes: 0 %
Lymphocytes Absolute: 2.2 10*3/uL (ref 0.7–3.1)
Lymphs: 41 %
MCH: 29.5 pg (ref 26.6–33.0)
MCHC: 33.3 g/dL (ref 31.5–35.7)
MCV: 89 fL (ref 79–97)
Monocytes Absolute: 0.3 10*3/uL (ref 0.1–0.9)
Monocytes: 5 %
NEUTROS PCT: 49 %
Neutrophils Absolute: 2.6 10*3/uL (ref 1.4–7.0)
Platelets: 249 10*3/uL (ref 150–379)
RBC: 4.51 x10E6/uL (ref 3.77–5.28)
RDW: 13.3 % (ref 12.3–15.4)
WBC: 5.4 10*3/uL (ref 3.4–10.8)

## 2016-12-30 LAB — LIPID PANEL W/O CHOL/HDL RATIO
Cholesterol, Total: 181 mg/dL (ref 100–199)
HDL: 56 mg/dL (ref >39)
LDL Calculated: 98 mg/dL (ref 0–99)
TRIGLYCERIDES: 135 mg/dL (ref 0–149)
VLDL Cholesterol Cal: 27 mg/dL (ref 5–40)

## 2016-12-30 LAB — BABESIA MICROTI ANTIBODY PANEL
Babesia microti IgG: <1:10 {titer}
Babesia microti IgM: <1:10 {titer}

## 2016-12-30 LAB — RA QN+CCP(IGG/A)+SJOSSA+SJOSSB
CYCLIC CITRULLIN PEPTIDE AB: 6 U (ref 0–19)
Rhuematoid fact SerPl-aCnc: <10 IU/mL (ref 0.0–13.9)

## 2016-12-30 LAB — ROCKY MTN SPOTTED FVR ABS PNL(IGG+IGM)
RMSF IGM: 0.4 {index} (ref 0.00–0.89)
RMSF IgG: NEGATIVE

## 2016-12-30 LAB — LYME AB/WESTERN BLOT REFLEX: LYME DISEASE AB, QUANT, IGM: <0.8 index (ref 0.00–0.79)

## 2016-12-30 LAB — SEDIMENTATION RATE: Sed Rate: 9 mm/hr (ref 0–32)

## 2016-12-30 LAB — EHRLICHIA ANTIBODY PANEL
E. Chaffeensis (HME) IgM Titer: NEGATIVE
E.Chaffeensis (HME) IgG: NEGATIVE
HGE IGG TITER: NEGATIVE
HGE IgM Titer: NEGATIVE

## 2016-12-30 LAB — ANA: Anti Nuclear Antibody(ANA): NEGATIVE

## 2016-12-30 LAB — TSH: TSH: 1.82 u[IU]/mL (ref 0.450–4.500)

## 2016-12-30 LAB — C-REACTIVE PROTEIN: CRP: 1 mg/L (ref 0.0–4.9)

## 2016-12-30 LAB — VITAMIN D 25 HYDROXY (VIT D DEFICIENCY, FRACTURES): Vit D, 25-Hydroxy: 31.8 ng/mL (ref 30.0–100.0)

## 2016-12-30 NOTE — Telephone Encounter (Signed)
Please let her know that all her labs came back normal. Thanks!

## 2016-12-30 NOTE — Telephone Encounter (Signed)
Patient notified

## 2017-01-09 DIAGNOSIS — H524 Presbyopia: Secondary | ICD-10-CM | POA: Diagnosis not present

## 2017-01-13 ENCOUNTER — Ambulatory Visit (INDEPENDENT_AMBULATORY_CARE_PROVIDER_SITE_OTHER): Payer: 59 | Admitting: Podiatry

## 2017-01-13 ENCOUNTER — Ambulatory Visit (INDEPENDENT_AMBULATORY_CARE_PROVIDER_SITE_OTHER): Payer: 59

## 2017-01-13 ENCOUNTER — Encounter: Payer: Self-pay | Admitting: Podiatry

## 2017-01-13 DIAGNOSIS — F329 Major depressive disorder, single episode, unspecified: Secondary | ICD-10-CM | POA: Insufficient documentation

## 2017-01-13 DIAGNOSIS — M722 Plantar fascial fibromatosis: Secondary | ICD-10-CM | POA: Diagnosis not present

## 2017-01-13 DIAGNOSIS — F32A Depression, unspecified: Secondary | ICD-10-CM | POA: Insufficient documentation

## 2017-01-13 DIAGNOSIS — M659 Synovitis and tenosynovitis, unspecified: Secondary | ICD-10-CM | POA: Diagnosis not present

## 2017-01-13 DIAGNOSIS — C4490 Unspecified malignant neoplasm of skin, unspecified: Secondary | ICD-10-CM | POA: Insufficient documentation

## 2017-01-13 DIAGNOSIS — Z889 Allergy status to unspecified drugs, medicaments and biological substances status: Secondary | ICD-10-CM | POA: Insufficient documentation

## 2017-01-19 NOTE — Progress Notes (Signed)
   Subjective: Patient presents today for pain and tenderness in the plantar aspect of bilateral heels, left worse than right, that began 3 months ago. Patient states that it hurts in the morning with the first steps out of bed. Patient presents today for further treatment and evaluation.   Past Medical History:  Diagnosis Date  . Allergic rhinitis   . Anxiety   . Depression   . History of skin cancer   . Hx of migraine headaches    light sensivity, unilateral HA     Objective: Physical Exam General: The patient is alert and oriented x3 in no acute distress.  Dermatology: Skin is warm, dry and supple bilateral lower extremities. Negative for open lesions or macerations bilateral.   Vascular: Dorsalis Pedis and Posterior Tibial pulses palpable bilateral.  Capillary fill time is immediate to all digits.  Neurological: Epicritic and protective threshold intact bilateral.   Musculoskeletal: Tenderness to palpation at the medial calcaneal tubercale and through the insertion of the plantar fascia of the bilateral feet. Pain on palpation to the anterior lateral medial aspects of the patient's left ankle. Mild edema noted. All other joints range of motion within normal limits bilateral. Strength 5/5 in all groups bilateral.   Radiographic exam: Normal osseous mineralization. Joint spaces preserved. No fracture/dislocation/boney destruction. Calcaneal spur present with mild thickening of plantar fascia bilateral. No other soft tissue abnormalities or radiopaque foreign bodies.   Assessment: 1. plantar fasciitis bilateral feet 2. Left ankle synovitis   Plan of Care:  1. Patient evaluated. Xrays reviewed.   2. Injection of 0.5cc Celestone soluspan injected into the bilateral heels.  3. Injection of 0.5 mL Celestone Soluspan injected in the patient's left ankle. 4. Continue Naprosyn 500 mg. 5. Plantar fascial band(s) dispensed for bilateral plantar fasciitis. 6. Appt with Liliane Channel for custom  molded orthotics.  7. Return to clinic in 4 weeks.    Edrick Kins, DPM [facility]  Dr. Edrick Kins, DPM    [address], [place] [address]                Office [phone]  Fax [phone]

## 2017-01-21 ENCOUNTER — Other Ambulatory Visit: Payer: 59 | Admitting: Orthotics

## 2017-01-22 ENCOUNTER — Ambulatory Visit
Admission: RE | Admit: 2017-01-22 | Discharge: 2017-01-22 | Disposition: A | Discharge status: Home / Self Care | Payer: 59 | Source: Ambulatory Visit | Attending: Family Medicine | Admitting: Family Medicine

## 2017-01-22 DIAGNOSIS — Z1239 Encounter for other screening for malignant neoplasm of breast: Secondary | ICD-10-CM

## 2017-01-22 DIAGNOSIS — Z1231 Encounter for screening mammogram for malignant neoplasm of breast: Secondary | ICD-10-CM | POA: Insufficient documentation

## 2017-01-23 ENCOUNTER — Encounter: Payer: Self-pay | Admitting: Family Medicine

## 2017-01-26 ENCOUNTER — Ambulatory Visit (INDEPENDENT_AMBULATORY_CARE_PROVIDER_SITE_OTHER): Payer: 59 | Admitting: Orthotics

## 2017-01-26 ENCOUNTER — Ambulatory Visit (INDEPENDENT_AMBULATORY_CARE_PROVIDER_SITE_OTHER): Payer: 59 | Admitting: Family Medicine

## 2017-01-26 ENCOUNTER — Encounter: Payer: Self-pay | Admitting: Family Medicine

## 2017-01-26 VITALS — BP 95/65 | HR 76 | Temp 98.4°F | Wt 155.2 lb

## 2017-01-26 DIAGNOSIS — F331 Major depressive disorder, recurrent, moderate: Secondary | ICD-10-CM

## 2017-01-26 DIAGNOSIS — M722 Plantar fascial fibromatosis: Secondary | ICD-10-CM

## 2017-01-26 DIAGNOSIS — R1030 Lower abdominal pain, unspecified: Secondary | ICD-10-CM | POA: Diagnosis not present

## 2017-01-26 MED ORDER — ACYCLOVIR 800 MG PO TABS: 800.0000 mg | ORAL_TABLET | Freq: Two times a day (BID) | ORAL | 1 refills | Status: DC

## 2017-01-26 MED ORDER — ESOMEPRAZOLE MAGNESIUM 20 MG PO CPDR: 20.0000 mg | DELAYED_RELEASE_CAPSULE | Freq: Every day | ORAL | 3 refills | Status: DC

## 2017-01-26 MED ORDER — NAPROXEN 500 MG PO TABS: 500.0000 mg | ORAL_TABLET | Freq: Two times a day (BID) | ORAL | 1 refills | Status: DC

## 2017-01-26 MED ORDER — ESCITALOPRAM OXALATE 5 MG PO TABS: 5.0000 mg | ORAL_TABLET | Freq: Every day | ORAL | 2 refills | Status: DC

## 2017-01-26 NOTE — Progress Notes (Signed)
BP 95/65 (BP Location: Left Arm, Patient Position: Sitting, Cuff Size: Normal)   Pulse 76   Temp 98.4 F (36.9 C)   Wt 155 lb 3 oz (70.4 kg)   SpO2 99%   BMI 25.82 kg/m    Subjective:    Patient ID: Karen Mess., female    DOB: 06/21/1970, 46 y.o.   MRN: [id number]  HPI: Karen Walker is a 46 y.o. female  Chief Complaint  Patient presents with  . Abdominal Pain  . Foot Pain  . Depression  . Mouth Lesions    Refill on acyclovir   DEPRESSION Mood status: uncontrolled Satisfied with current treatment?: no Symptom severity: moderate  Medication compliance: Not on anything Psychotherapy/counseling: no  Previous psychiatric medications: none Depressed mood: yes Anxious mood: yes Anhedonia: no Significant weight loss or gain: no Insomnia: yes hard to fall asleep Fatigue: yes Feelings of worthlessness or guilt: yes Impaired concentration/indecisiveness: yes Suicidal ideations: no Hopelessness: yes Crying spells: yes Depression screen [facility] 2/9 01/26/2017 12/26/2016  Decreased Interest 2 3  Down, Depressed, Hopeless 2 3  PHQ - 2 Score 4 6  Altered sleeping 2 1  Tired, decreased energy 2 2  Change in appetite 2 3  Feeling bad or failure about yourself  3 3  Trouble concentrating 2 1  Moving slowly or fidgety/restless 2 2  Suicidal thoughts 1 1  PHQ-9 Score 18 19  Difficult doing work/chores Very difficult Very difficult   FOOT PAIN- saw podiatry 2 weeks ago for plantar fasciitis. Had steroid injection into bilateral heels and into her L ankle. She was given plantar fascia bands and to come back for orthotics- for which she was measured today. Duration: more than 3 months Involved foot: bilateral Mechanism of injury: unknown Location: heels Onset: gradual  Severity: moderate  Quality:  Aching, sore Frequency: constant Radiation: no Aggravating factors: walking at work   Alleviating factors: nothing   Status: worse Treatments attempted: ice, heat, insoles,  rolling her foot, naproxen and steroid injections Relief with NSAIDs?:  Naproxyn Weakness with weight bearing or walking: yes Morning stiffness: yes Swelling: no Redness: no Bruising: no Paresthesias / decreased sensation: yes  Fevers:no   ABDOMINAL ISSUES- had food poisoning on Sunday and is still recovering. Took nexium and that seems like it has been helping. Still feeling like she has had bad breath since then.  Duration: chronic Nature: bloating and burning Location: LLQ and RLQ  Severity: mild  Radiation: no Episode duration: few minutes Frequency: intermittent Alleviating factors: peeing Aggravating factors: holding her urine Treatments attempted: none Constipation: intermittent Diarrhea: no Mucous in the stool: no Heartburn: no Bloating:yes Flatulence: no Nausea: yes Vomiting: no Melena or hematochezia: no Rash: no Jaundice: no Fever: no Weight loss: no   Relevant past medical, surgical, family and social history reviewed and updated as indicated. Interim medical history since our last visit reviewed. Allergies and medications reviewed and updated.  Review of Systems  Constitutional: Negative.   Respiratory: Negative.   Cardiovascular: Negative.   Neurological: Negative.   Psychiatric/Behavioral: Positive for decreased concentration, dysphoric mood and sleep disturbance. Negative for agitation, behavioral problems, confusion, hallucinations, self-injury and suicidal ideas. The patient is nervous/anxious. The patient is not hyperactive.     Per HPI unless specifically indicated above     Objective:    BP 95/65 (BP Location: Left Arm, Patient Position: Sitting, Cuff Size: Normal)   Pulse 76   Temp 98.4 F (36.9 C)   Wt 155 lb 3  oz (70.4 kg)   SpO2 99%   BMI 25.82 kg/m   Wt Readings from Last 3 Encounters:  01/26/17 155 lb 3 oz (70.4 kg)  12/26/16 155 lb 9.6 oz (70.6 kg)  12/25/16 156 lb (70.8 kg)    Physical Exam  Constitutional: She is oriented  to person, place, and time. She appears well-developed and well-nourished. No distress.  HENT:  Head: Normocephalic and atraumatic.  Right Ear: Hearing normal.  Left Ear: Hearing normal.  Nose: Nose normal.  Eyes: Conjunctivae and lids are normal. Right eye exhibits no discharge. Left eye exhibits no discharge. No scleral icterus.  Cardiovascular: Normal rate, regular rhythm, normal heart sounds and intact distal pulses.  Exam reveals no gallop and no friction rub.   No murmur heard. Pulmonary/Chest: Effort normal and breath sounds normal. No respiratory distress. She has no wheezes. She has no rales. She exhibits no tenderness.  Musculoskeletal: Normal range of motion.  Neurological: She is alert and oriented to person, place, and time.  Skin: Skin is warm, dry and intact. No rash noted. She is not diaphoretic. No erythema. No pallor.  Psychiatric: Her speech is normal and behavior is normal. Judgment and thought content normal. Her mood appears anxious. Cognition and memory are normal. She exhibits a depressed mood.  Nursing note and vitals reviewed.   Results for orders placed or performed in visit on 12/26/16  Microscopic Examination  Result Value Ref Range   WBC, UA 0-5 0 - 5 /hpf   RBC, UA 0-2 0 - 2 /hpf   Epithelial Cells (non renal) 0-10 0 - 10 /hpf   Bacteria, UA None seen None seen/Few  Urine Culture, Reflex  Result Value Ref Range   Urine Culture, Routine Final report    Organism ID, Bacteria Comment   CBC with Differential/Platelet  Result Value Ref Range   WBC 5.4 3.4 - 10.8 x10E3/uL   RBC 4.51 3.77 - 5.28 x10E6/uL   Hemoglobin 13.3 11.1 - 15.9 g/dL   Hematocrit 40.0 34.0 - 46.6 %   MCV 89 79 - 97 fL   MCH 29.5 26.6 - 33.0 pg   MCHC 33.3 31.5 - 35.7 g/dL   RDW 13.3 12.3 - 15.4 %   Platelets 249 150 - 379 x10E3/uL   Neutrophils 49 Not Estab. %   Lymphs 41 Not Estab. %   Monocytes 5 Not Estab. %   Eos 5 Not Estab. %   Basos 0 Not Estab. %   Neutrophils Absolute  2.6 1.4 - 7.0 x10E3/uL   Lymphocytes Absolute 2.2 0.7 - 3.1 x10E3/uL   Monocytes Absolute 0.3 0.1 - 0.9 x10E3/uL   EOS (ABSOLUTE) 0.3 0.0 - 0.4 x10E3/uL   Basophils Absolute 0.0 0.0 - 0.2 x10E3/uL   Immature Granulocytes 0 Not Estab. %   Immature Grans (Abs) 0.0 0.0 - 0.1 x10E3/uL  Comprehensive metabolic panel  Result Value Ref Range   Glucose 64 (L) 65 - 99 mg/dL   BUN 12 6 - 24 mg/dL   Creatinine, Ser 0.65 0.57 - 1.00 mg/dL   GFR calc non Af Amer 107 >59 mL/min/1.73   GFR calc Af Amer 123 >59 mL/min/1.73   BUN/Creatinine Ratio 18 9 - 23   Sodium 139 134 - 144 mmol/L   Potassium 3.9 3.5 - 5.2 mmol/L   Chloride 99 96 - 106 mmol/L   CO2 26 20 - 29 mmol/L   Calcium 9.9 8.7 - 10.2 mg/dL   Total Protein 7.6 6.0 - 8.5 g/dL  Albumin 4.6 3.5 - 5.5 g/dL   Globulin, Total 3.0 1.5 - 4.5 g/dL   Albumin/Globulin Ratio 1.5 1.2 - 2.2   Bilirubin Total 0.4 0.0 - 1.2 mg/dL   Alkaline Phosphatase 66 39 - 117 IU/L   AST 19 0 - 40 IU/L   ALT 16 0 - 32 IU/L  Lipid Panel w/o Chol/HDL Ratio  Result Value Ref Range   Cholesterol, Total 181 100 - 199 mg/dL   Triglycerides 135 0 - 149 mg/dL   HDL 56 >39 mg/dL   VLDL Cholesterol Cal 27 5 - 40 mg/dL   LDL Calculated 98 0 - 99 mg/dL  TSH  Result Value Ref Range   TSH 1.820 0.450 - 4.500 uIU/mL  UA/M w/rflx Culture, Routine  Result Value Ref Range   Specific Gravity, UA 1.010 1.005 - 1.030   pH, UA 7.0 5.0 - 7.5   Color, UA Yellow Yellow   Appearance Ur Clear Clear   Leukocytes, UA 1+ (A) Negative   Protein, UA Negative Negative/Trace   Glucose, UA Negative Negative   Ketones, UA Negative Negative   RBC, UA Trace (A) Negative   Bilirubin, UA Negative Negative   Urobilinogen, Ur 0.2 0.2 - 1.0 mg/dL   Nitrite, UA Negative Negative   Microscopic Examination See below:    Urinalysis Reflex Comment   Antinuclear Antib (ANA)  Result Value Ref Range   Anit Nuclear Antibody(ANA) Negative Negative  RA Qn+CCP(IgG/A)+SjoSSA+SjoSSB  Result Value  Ref Range   Rhuematoid fact SerPl-aCnc <10.0 0.0 - 13.9 IU/mL   ENA SSA (RO) Ab <0.2 0.0 - 0.9 AI   ENA SSB (LA) Ab <0.2 0.0 - 0.9 AI   Cyclic Citrullin Peptide Ab 6 0 - 19 units  Sed Rate (ESR)  Result Value Ref Range   Sed Rate 9 0 - 32 mm/hr  C-reactive protein  Result Value Ref Range   CRP 1.0 0.0 - 4.9 mg/L  Lyme Ab/Western Blot Reflex  Result Value Ref Range   Lyme IgG/IgM Ab <0.91 0.00 - 0.90 ISR   LYME DISEASE AB, QUANT, IGM <0.80 0.00 - 0.53 index  Ehrlichia Antibody Panel  Result Value Ref Range   E.Chaffeensis (HME) IgG Negative Neg:<1:64   E. Chaffeensis (HME) IgM Titer Negative Neg:<1:20   HGE IgG Titer Negative Neg:<1:64   HGE IgM Titer Negative Neg:<1:20  Babesia microti Antibody Panel  Result Value Ref Range   Babesia microti IgM <1:10 Neg:<1:10   Babesia microti IgG <1:10 Neg:<1:10  Rocky mtn spotted fvr abs pnl(IgG+IgM)  Result Value Ref Range   RMSF IgG Negative Negative   RMSF IgM 0.40 0.00 - 0.89 index  VITAMIN D 25 Hydroxy (Vit-D Deficiency, Fractures)  Result Value Ref Range   Vit D, 25-Hydroxy 31.8 30.0 - 100.0 ng/mL      Assessment & Plan:   Problem List Items Addressed This Visit      Other   Major depression, recurrent ([place]) - Primary    Not doing well. Will start low dose lexapro. Recheck 1 month.       Relevant Medications   escitalopram (LEXAPRO) 5 MG tablet    Other Visit Diagnoses    Lower abdominal pain       Now with some dysparunia. Will obtain pelvic US. Await results.    Relevant Orders   US Pelvis Complete   Plantar fasciitis       Improving. Continue to follow with podiatry. Call with any concerns.  Follow up plan: Return in about 4 weeks (around 02/23/2017) for Follow up mood.

## 2017-01-26 NOTE — Progress Notes (Signed)

## 2017-01-26 NOTE — Assessment & Plan Note (Signed)
Not doing well. Will start low dose lexapro. Recheck 1 month.

## 2017-01-27 MED ORDER — BETAMETHASONE SOD PHOS & ACET 6 (3-3) MG/ML IJ SUSP: 3.0000 mg | Freq: Once | INTRAMUSCULAR | Status: DC

## 2017-01-30 ENCOUNTER — Telehealth: Payer: Self-pay | Admitting: Family Medicine

## 2017-01-30 DIAGNOSIS — R102 Pelvic and perineal pain: Secondary | ICD-10-CM

## 2017-01-30 NOTE — Telephone Encounter (Signed)
Can you get the IMG codes so I put in the right ones?

## 2017-01-30 NOTE — Telephone Encounter (Signed)
Copied from [place] [phone]. Topic: Inquiry >> Jan 30, 2017  1:04 PM Neva Seat wrote: Reason for CRM:    Pt needs additional abdominal and non OB trans vaginal order  [phone]  [facility] w/[place]

## 2017-01-30 NOTE — Telephone Encounter (Signed)
Routing to provider  

## 2017-01-30 NOTE — Telephone Encounter (Signed)
IMG 547

## 2017-01-30 NOTE — Telephone Encounter (Signed)
Copied from [place] [id number]. Topic: General - Other >> Jan 30, 2017  2:14 PM Pricilla Handler wrote: Reason for CRM: Vivien Rota from [facility] called requesting for Dr. Wynetta Emery to add an Order for a needed imaging procedure. The procedure number is IMG [id number]. Please have Dr Wynetta Emery to add this order. Thank You!!!

## 2017-02-02 ENCOUNTER — Ambulatory Visit
Admission: RE | Admit: 2017-02-02 | Discharge: 2017-02-02 | Disposition: A | Discharge status: Home / Self Care | Payer: 59 | Source: Ambulatory Visit | Attending: Family Medicine | Admitting: Family Medicine

## 2017-02-02 DIAGNOSIS — R1030 Lower abdominal pain, unspecified: Secondary | ICD-10-CM | POA: Diagnosis not present

## 2017-02-02 DIAGNOSIS — N941 Unspecified dyspareunia: Secondary | ICD-10-CM | POA: Diagnosis not present

## 2017-02-02 DIAGNOSIS — R102 Pelvic and perineal pain: Secondary | ICD-10-CM | POA: Diagnosis not present

## 2017-02-02 DIAGNOSIS — Z90710 Acquired absence of both cervix and uterus: Secondary | ICD-10-CM | POA: Diagnosis not present

## 2017-02-03 ENCOUNTER — Telehealth: Payer: Self-pay | Admitting: Family Medicine

## 2017-02-03 NOTE — Telephone Encounter (Signed)
Please let her know that her [place] was totally normal. If she's still having pain and is still having trouble with her belly, let's get her back in to talk about it.  Thanks!

## 2017-02-03 NOTE — Telephone Encounter (Signed)
Patient notified

## 2017-02-16 ENCOUNTER — Other Ambulatory Visit: Payer: 59 | Admitting: Orthotics

## 2017-02-23 ENCOUNTER — Other Ambulatory Visit: Payer: 59 | Admitting: Orthotics

## 2017-02-23 ENCOUNTER — Ambulatory Visit (INDEPENDENT_AMBULATORY_CARE_PROVIDER_SITE_OTHER): Payer: 59 | Admitting: Orthotics

## 2017-02-23 DIAGNOSIS — M722 Plantar fascial fibromatosis: Secondary | ICD-10-CM | POA: Diagnosis not present

## 2017-02-23 DIAGNOSIS — M659 Synovitis and tenosynovitis, unspecified: Secondary | ICD-10-CM

## 2017-02-23 NOTE — Progress Notes (Signed)
Patient came in today to pick up custom made foot orthotics.  The goals were accomplished and the patient reported no dissatisfaction with said orthotics.  Patient was advised of breakin period and how to report any issues. 

## 2017-03-06 ENCOUNTER — Ambulatory Visit: Payer: 59 | Admitting: Family Medicine

## 2017-03-17 ENCOUNTER — Ambulatory Visit (INDEPENDENT_AMBULATORY_CARE_PROVIDER_SITE_OTHER): Payer: 59 | Admitting: Vascular Surgery

## 2017-03-17 ENCOUNTER — Encounter (INDEPENDENT_AMBULATORY_CARE_PROVIDER_SITE_OTHER): Payer: 59

## 2017-03-18 ENCOUNTER — Encounter: Payer: Self-pay | Admitting: Family Medicine

## 2017-03-18 ENCOUNTER — Ambulatory Visit (INDEPENDENT_AMBULATORY_CARE_PROVIDER_SITE_OTHER): Payer: 59 | Admitting: Family Medicine

## 2017-03-18 ENCOUNTER — Ambulatory Visit: Payer: 59 | Admitting: Family Medicine

## 2017-03-18 VITALS — BP 114/70 | HR 81 | Wt 154.0 lb

## 2017-03-18 DIAGNOSIS — B029 Zoster without complications: Secondary | ICD-10-CM | POA: Diagnosis not present

## 2017-03-18 MED ORDER — VALACYCLOVIR HCL 1 G PO TABS: 1000.0000 mg | ORAL_TABLET | Freq: Two times a day (BID) | ORAL | 0 refills | Status: DC

## 2017-03-18 NOTE — Progress Notes (Signed)
BP 114/70   Pulse 81   Wt 154 lb (69.9 kg)   SpO2 98%   BMI 25.63 kg/m    Subjective:    Patient ID: Karen Mess., female    DOB: 1970-10-05, 46 y.o.   MRN: [id number]  HPI: Karen Walker is a 46 y.o. female  Chief Complaint  Patient presents with  . Rash   RASH Duration:  2 days  Location: left breast around to back  Itching: no Burning: yes Redness: yes Oozing: yes Scaling: yes Blisters: no Painful: yes Fevers: no Change in detergents/soaps/personal care products: no Recent illness: no Recent travel:no History of same: no Context: stable Alleviating factors: nothing Treatments attempted:nothing Shortness of breath: no  Throat/tongue swelling: no Myalgias/arthralgias: no  Relevant past medical, surgical, family and social history reviewed and updated as indicated. Interim medical history since our last visit reviewed. Allergies and medications reviewed and updated.  Review of Systems  Constitutional: Negative.   Respiratory: Negative.   Cardiovascular: Negative.   Skin: Positive for rash. Negative for color change, pallor and wound.  Psychiatric/Behavioral: Negative.     Per HPI unless specifically indicated above     Objective:    BP 114/70   Pulse 81   Wt 154 lb (69.9 kg)   SpO2 98%   BMI 25.63 kg/m   Wt Readings from Last 3 Encounters:  03/18/17 154 lb (69.9 kg)  01/26/17 155 lb 3 oz (70.4 kg)  12/26/16 155 lb 9.6 oz (70.6 kg)    Physical Exam  Constitutional: She is oriented to person, place, and time. She appears well-developed and well-nourished. No distress.  HENT:  Head: Normocephalic and atraumatic.  Right Ear: Hearing normal.  Left Ear: Hearing normal.  Nose: Nose normal.  Eyes: Conjunctivae and lids are normal. Right eye exhibits no discharge. Left eye exhibits no discharge. No scleral icterus.  Cardiovascular: Normal rate, regular rhythm, normal heart sounds and intact distal pulses. Exam reveals no gallop and no friction  rub.  No murmur heard. Pulmonary/Chest: Effort normal and breath sounds normal. No respiratory distress. She has no wheezes. She has no rales. She exhibits no tenderness.  Musculoskeletal: Normal range of motion.  Neurological: She is alert and oriented to person, place, and time.  Skin: Skin is warm, dry and intact. Rash noted. She is not diaphoretic. No erythema. No pallor.  Vesicular rash from L breast around to back  Psychiatric: She has a normal mood and affect. Her speech is normal and behavior is normal. Judgment and thought content normal. Cognition and memory are normal.  Nursing note and vitals reviewed.   Results for orders placed or performed in visit on 12/26/16  Microscopic Examination  Result Value Ref Range   WBC, UA 0-5 0 - 5 /hpf   RBC, UA 0-2 0 - 2 /hpf   Epithelial Cells (non renal) 0-10 0 - 10 /hpf   Bacteria, UA None seen None seen/Few  Urine Culture, Reflex  Result Value Ref Range   Urine Culture, Routine Final report    Organism ID, Bacteria Comment   CBC with Differential/Platelet  Result Value Ref Range   WBC 5.4 3.4 - 10.8 x10E3/uL   RBC 4.51 3.77 - 5.28 x10E6/uL   Hemoglobin 13.3 11.1 - 15.9 g/dL   Hematocrit 40.0 34.0 - 46.6 %   MCV 89 79 - 97 fL   MCH 29.5 26.6 - 33.0 pg   MCHC 33.3 31.5 - 35.7 g/dL   RDW 13.3 12.3 -  15.4 %   Platelets 249 150 - 379 x10E3/uL   Neutrophils 49 Not Estab. %   Lymphs 41 Not Estab. %   Monocytes 5 Not Estab. %   Eos 5 Not Estab. %   Basos 0 Not Estab. %   Neutrophils Absolute 2.6 1.4 - 7.0 x10E3/uL   Lymphocytes Absolute 2.2 0.7 - 3.1 x10E3/uL   Monocytes Absolute 0.3 0.1 - 0.9 x10E3/uL   EOS (ABSOLUTE) 0.3 0.0 - 0.4 x10E3/uL   Basophils Absolute 0.0 0.0 - 0.2 x10E3/uL   Immature Granulocytes 0 Not Estab. %   Immature Grans (Abs) 0.0 0.0 - 0.1 x10E3/uL  Comprehensive metabolic panel  Result Value Ref Range   Glucose 64 (L) 65 - 99 mg/dL   BUN 12 6 - 24 mg/dL   Creatinine, Ser 0.65 0.57 - 1.00 mg/dL   GFR calc  non Af Amer 107 >59 mL/min/1.73   GFR calc Af Amer 123 >59 mL/min/1.73   BUN/Creatinine Ratio 18 9 - 23   Sodium 139 134 - 144 mmol/L   Potassium 3.9 3.5 - 5.2 mmol/L   Chloride 99 96 - 106 mmol/L   CO2 26 20 - 29 mmol/L   Calcium 9.9 8.7 - 10.2 mg/dL   Total Protein 7.6 6.0 - 8.5 g/dL   Albumin 4.6 3.5 - 5.5 g/dL   Globulin, Total 3.0 1.5 - 4.5 g/dL   Albumin/Globulin Ratio 1.5 1.2 - 2.2   Bilirubin Total 0.4 0.0 - 1.2 mg/dL   Alkaline Phosphatase 66 39 - 117 IU/L   AST 19 0 - 40 IU/L   ALT 16 0 - 32 IU/L  Lipid Panel w/o Chol/HDL Ratio  Result Value Ref Range   Cholesterol, Total 181 100 - 199 mg/dL   Triglycerides 135 0 - 149 mg/dL   HDL 56 >39 mg/dL   VLDL Cholesterol Cal 27 5 - 40 mg/dL   LDL Calculated 98 0 - 99 mg/dL  TSH  Result Value Ref Range   TSH 1.820 0.450 - 4.500 uIU/mL  UA/M w/rflx Culture, Routine  Result Value Ref Range   Specific Gravity, UA 1.010 1.005 - 1.030   pH, UA 7.0 5.0 - 7.5   Color, UA Yellow Yellow   Appearance Ur Clear Clear   Leukocytes, UA 1+ (A) Negative   Protein, UA Negative Negative/Trace   Glucose, UA Negative Negative   Ketones, UA Negative Negative   RBC, UA Trace (A) Negative   Bilirubin, UA Negative Negative   Urobilinogen, Ur 0.2 0.2 - 1.0 mg/dL   Nitrite, UA Negative Negative   Microscopic Examination See below:    Urinalysis Reflex Comment   Antinuclear Antib (ANA)  Result Value Ref Range   Anit Nuclear Antibody(ANA) Negative Negative  RA Qn+CCP(IgG/A)+SjoSSA+SjoSSB  Result Value Ref Range   Rhuematoid fact SerPl-aCnc <10.0 0.0 - 13.9 IU/mL   ENA SSA (RO) Ab <0.2 0.0 - 0.9 AI   ENA SSB (LA) Ab <0.2 0.0 - 0.9 AI   Cyclic Citrullin Peptide Ab 6 0 - 19 units  Sed Rate (ESR)  Result Value Ref Range   Sed Rate 9 0 - 32 mm/hr  C-reactive protein  Result Value Ref Range   CRP 1.0 0.0 - 4.9 mg/L  Lyme Ab/Western Blot Reflex  Result Value Ref Range   Lyme IgG/IgM Ab <0.91 0.00 - 0.90 ISR   LYME DISEASE AB, QUANT, IGM  <0.80 0.00 - 5.64 index  Ehrlichia Antibody Panel  Result Value Ref Range   E.Chaffeensis (HME) IgG  Negative Neg:<1:64   E. Chaffeensis (HME) IgM Titer Negative Neg:<1:20   HGE IgG Titer Negative Neg:<1:64   HGE IgM Titer Negative Neg:<1:20  Babesia microti Antibody Panel  Result Value Ref Range   Babesia microti IgM <1:10 Neg:<1:10   Babesia microti IgG <1:10 Neg:<1:10  Rocky mtn spotted fvr abs pnl(IgG+IgM)  Result Value Ref Range   RMSF IgG Negative Negative   RMSF IgM 0.40 0.00 - 0.89 index  VITAMIN D 25 Hydroxy (Vit-D Deficiency, Fractures)  Result Value Ref Range   Vit D, 25-Hydroxy 31.8 30.0 - 100.0 ng/mL      Assessment & Plan:   Problem List Items Addressed This Visit    None    Visit Diagnoses    Herpes zoster without complication    -  Primary   Will treat with valacyclovir. Out of work until rash dries up. Call with any concerns.    Relevant Medications   valACYclovir (VALTREX) 1000 MG tablet       Follow up plan: Return if symptoms worsen or fail to improve.

## 2017-03-18 NOTE — Patient Instructions (Addendum)
Shingles Shingles is an infection that causes a painful skin rash and fluid-filled blisters. Shingles is caused by the same virus that causes chickenpox. Shingles only develops in people who:  Have had chickenpox.  Have gotten the chickenpox vaccine. (This is rare.)  The first symptoms of shingles may be itching, tingling, or pain in an area on your skin. A rash will follow in a few days or weeks. The rash is usually on one side of the body in a bandlike or beltlike pattern. Over time, the rash turns into fluid-filled blisters that break open, scab over, and dry up. Medicines may:  Help you manage pain.  Help you recover more quickly.  Help to prevent long-term problems.  Follow these instructions at home: Medicines  Take medicines only as told by your doctor.  Apply an anti-itch or numbing cream to the affected area as told by your doctor. Blister and Rash Care  Take a cool bath or put cool compresses on the area of the rash or blisters as told by your doctor. This may help with pain and itching.  Keep your rash covered with a loose bandage (dressing). Wear loose-fitting clothing.  Keep your rash and blisters clean with mild soap and cool water or as told by your doctor.  Check your rash every day for signs of infection. These include redness, swelling, and pain that lasts or gets worse.  Do not pick your blisters.  Do not scratch your rash. General instructions  Rest as told by your doctor.  Keep all follow-up visits as told by your doctor. This is important.  Until your blisters scab over, your infection can cause chickenpox in people who have never had it or been vaccinated against it. To prevent this from happening, avoid touching other people or being around other people, especially: ? Babies. ? Pregnant women. ? Children who have eczema. ? Elderly people who have transplants. ? People who have chronic illnesses, such as leukemia or AIDS. Contact a doctor  if:  Your pain does not get better with medicine.  Your pain does not get better after the rash heals.  Your rash looks infected. Signs of infection include: ? Redness. ? Swelling. ? Pain that lasts or gets worse. Get help right away if:  The rash is on your face or nose.  You have pain in your face, pain around your eye area, or loss of feeling on one side of your face.  You have ear pain or you have ringing in your ear.  You have loss of taste.  Your condition gets worse. This information is not intended to replace advice given to you by your health care provider. Make sure you discuss any questions you have with your health care provider. Document Released: 09/10/2007 Document Revised: 11/18/2015 Document Reviewed: 01/03/2014 Elsevier Interactive Patient Education  2018 [place] (Shingles) La culebrilla, tambin conocida como herpes zster, es una infeccin que causa una erupcin dolorosa en la piel y ampollas llenas de lquido. La culebrilla no est relacionada con el herpes genital, que es una enfermedad de transmisin sexual. Solo se manifiesta en personas que:  Tuvieron varicela.  Recibieron la vacuna contra la varicela. (Esto es poco frecuente). CAUSAS La causa de la culebrilla es el virus de la varicela zster (VVZ), el mismo virus que causa la varicela. Despus de la exposicin al virus de la varicela zster, este permanece en el organismo en un estado inactivo (latente). La culebrilla aparece si el virus se reactiva.  Esto puede ocurrir muchos aos despus de la exposicin inicial al virus. No se conocen las causas por las que este virus se reactiva. [place] que tuvieron varicela o recibieron la vacuna contra la varicela estn en riesgo de tener culebrilla. La infeccin es ms frecuente en las personas que:  Tienen ms de 50aos.  Tienen el sistema de defensa del organismo (sistema inmunitario) debilitado, como en los  enfermos con VIH, sida o cncer.  Toman medicamentos que debilitan el sistema inmunitario, como los medicamentos para trasplantes.  Estn sometidas a un gran estrs. SNTOMAS Los primeros sntomas de esta afeccin pueden incluir picazn, hormigueo o dolor en una zona de la piel. Este dolor se puede describir como ardor, punzante o pulstil. Unos das o semanas despus de que comienzan los sntomas, aparece una erupcin rojiza y dolorosa en un lado del cuerpo en un patrn con forma de cinto o de banda. Finalmente, la erupcin se convierte en ampollas llenas de lquido que se abren, forman costras y se secan en dos o tres Eros. En cualquier momento durante la infeccin, puede presentar lo siguiente:  Cristy Hilts.  Escalofros.  Dolor de [place].  Malestar estomacal. DIAGNSTICO Esta afeccin se diagnostica con un examen de la piel. En algunos casos, se extraen muestras de piel o del lquido de las ampollas antes de definir el diagnstico. Estas muestras se examinan con el microscopio o se envan al laboratorio para su anlisis. TRATAMIENTO No hay una cura especfica para esta afeccin. El mdico probablemente le recete medicamentos para ayudarlo a Financial controller, a recuperarse ms rpido y a Customer service manager a Barrister's clerk. Entre los medicamentos se incluyen los siguientes:  Medicamentos antivirales.  Antiinflamatorios.  Analgsicos. Si la zona afectada est en el rostro, podrn derivarlo a un especialista, como un mdico especialista en ojos (oftalmlogo) o en odos, nariz y Investment banker, operational (otorrinolaringlogo) para evitar problemas oculares, dolor crnico o discapacidad. [place] los medicamentos solamente como se lo haya indicado el mdico.  Aplique una crema anestsica o una para calmar la picazn en la zona afectada segn las indicaciones del mdico. Cuidado de la erupcin y las ampollas  Tome un bao de agua fra o aplique  compresas fras en la zona de la erupcin o las ampollas como se lo haya indicado el mdico. Esto aliviar el dolor y [place].  Mantenga la zona de la erupcin cubierta con una venda (vendaje). Use ropa holgada para ayudar a Best boy del roce con la erupcin.  Mantenga la erupcin y las ampollas limpias con jabn suave y agua fresca o como se lo indique el mdico.  Controle la erupcin todos los das para detectar signos de infeccin. Estos signos incluyen enrojecimiento, hinchazn y dolor que perdura o [place].  No pellizque las ampollas.  No se rasque la zona de la erupcin. Instrucciones generales  Haga reposo segn las indicaciones del mdico.  Concurra a todas las visitas de control como se lo haya indicado el mdico. Esto es importante.  Hasta tanto las ampollas formen costras, la infeccin puede causar varicela en las personas que nunca la tuvieron o no se vacunaron contra la varicela. Para impedir que esto suceda, evite el contacto con [employer], en especial: ? Bebs. ? Embarazadas. ? Nios que Haematologist. ? Personas mayores que han recibido un trasplante. ? Personas con enfermedades crnicas, como leucemia y sida. SOLICITE ATENCIN MDICA SI:  El dolor no se [place] con  los medicamentos prescritos.  El dolor no mejora despus de que la erupcin desaparece.  La erupcin parece infectada. Los signos de infeccin incluyen enrojecimiento, hinchazn y dolor que perdura o [place].  SOLICITE ATENCIN MDICA DE INMEDIATO SI:  La erupcin aparece en el rostro o la nariz.  Tiene dolor en el rostro, en la zona de los ojos o tiene prdida de la sensibilidad en un lado del rostro.  Siente dolor o un zumbido en el odo.  Tiene prdida del gusto.  La afeccin empeora.  Esta informacin no tiene Marine scientist el consejo del mdico. Asegrese de hacerle al mdico cualquier pregunta que tenga. Document Released: 01/01/2005 Document Revised: 07/16/2015  Document Reviewed: 02/02/2014 Elsevier Interactive Patient Education  2017 [employer].

## 2017-04-17 ENCOUNTER — Encounter (INDEPENDENT_AMBULATORY_CARE_PROVIDER_SITE_OTHER): Payer: Self-pay | Admitting: Vascular Surgery

## 2017-04-17 ENCOUNTER — Ambulatory Visit (INDEPENDENT_AMBULATORY_CARE_PROVIDER_SITE_OTHER): Payer: 59 | Admitting: Vascular Surgery

## 2017-04-17 ENCOUNTER — Ambulatory Visit (INDEPENDENT_AMBULATORY_CARE_PROVIDER_SITE_OTHER): Payer: 59

## 2017-04-17 VITALS — BP 105/65 | HR 72 | Resp 17 | Wt 151.6 lb

## 2017-04-17 DIAGNOSIS — M7989 Other specified soft tissue disorders: Secondary | ICD-10-CM

## 2017-04-17 DIAGNOSIS — I83813 Varicose veins of bilateral lower extremities with pain: Secondary | ICD-10-CM

## 2017-04-17 NOTE — Progress Notes (Signed)
Patient ID: Karen Mess., female   DOB: October 08, 1970, 47 y.o.   MRN: [id number]  Chief Complaint  Patient presents with  . Follow-up    9mo bil ven reflux    HPI Karen Walker. is a 47 y.o. female.  Patient returns in follow up of their venous disease.  They have done their best to comply with the prescribed conservative therapies of compression stockings, leg elevation, exercise, and still requires anti-inflammatories for discomfort and has symptoms that are persistent and bothersome on a daily basis, affecting their activities of daily living and normal activities.  The venous reflux study demonstrates no deep venous thrombosis or superficial thrombolysis in either lower extremity.  No superficial venous reflux was identified in the left lower extremity, but a short segment of deep venous reflux was seen.         Past Medical History:  Diagnosis Date  . Allergic rhinitis   . Anxiety   . Depression   . History of skin cancer   . Hx of migraine headaches    light sensivity, unilateral HA         Past Surgical History:  Procedure Laterality Date  . ABDOMINAL HYSTERECTOMY  2014  . BREAST CYST ASPIRATION Right 2013   negative. Done at Dr. Dwyane Luo office  . HERNIA REPAIR  2014  . hysterectemy    . melonoma removal on R medial arch of foot  2008  . UMBILICAL HERNIA REPAIR     occurred with hysterectemy         Family History  Problem Relation Age of Onset  . Stroke Father     Social History     Social History  Substance Use Topics  . Smoking status: Never Smoker  . Smokeless tobacco: Never Used  . Alcohol use No        Allergies  Allergen Reactions  . Codeine Other (See Comments)  . Pollen Extract           Current Outpatient Prescriptions  Medication Sig Dispense Refill  . Calcium Carbonate (CALCIUM 600 PO) Take 600 mg by mouth daily.    . Cyanocobalamin (B-12 PO) Take by mouth daily.    . fluticasone (FLONASE) 50  MCG/ACT nasal spray Place 2 sprays into both nostrils daily. (Patient not taking: Reported on 11/14/2016) 16 g 6  . ketoconazole (NIZORAL) 2 % shampoo as needed.  5   No current facility-administered medications for this visit.       REVIEW OF SYSTEMS (Negative unless checked)  Constitutional: [] Weight loss  [] Fever  [] Chills Cardiac: [] Chest pain   [] Chest pressure   [] Palpitations   [] Shortness of breath when laying flat   [] Shortness of breath at rest   [] Shortness of breath with exertion. Vascular:  [] Pain in legs with walking   [] Pain in legs at rest   [] Pain in legs when laying flat   [] Claudication   [] Pain in feet when walking  [] Pain in feet at rest  [] Pain in feet when laying flat   [] History of DVT   [] Phlebitis   [x] Swelling in legs   [x] Varicose veins   [] Non-healing ulcers Pulmonary:   [] Uses home oxygen   [] Productive cough   [] Hemoptysis   [] Wheeze  [] COPD   [] Asthma Neurologic:  [] Dizziness  [] Blackouts   [] Seizures   [] History of stroke   [] History of TIA  [] Aphasia   [] Temporary blindness   [] Dysphagia   [] Weakness or numbness in arms   [] Weakness or  numbness in legs Musculoskeletal:  [] Arthritis   [] Joint swelling   [] Joint pain   [] Low back pain Hematologic:  [] Easy bruising  [] Easy bleeding   [] Hypercoagulable state   [] Anemic  [] Hepatitis Gastrointestinal:  [] Blood in stool   [] Vomiting blood  [] Gastroesophageal reflux/heartburn   [] Abdominal pain Genitourinary:  [] Chronic kidney disease   [] Difficult urination  [] Frequent urination  [] Burning with urination   [] Hematuria Skin:  [] Rashes   [] Ulcers   [] Wounds Psychological:  [x] History of anxiety   [x]  History of major depression       Physical Exam BP 105/65 (BP Location: Right Arm)   Pulse 72   Resp 17   Wt 151 lb 9.6 oz (68.8 kg)   BMI 25.23 kg/m  Gen:  WD/WN, NAD Head: [place]/AT, No temporalis wasting.  Ear/Nose/Throat: Hearing grossly intact, dentition good Eyes: Sclera non-icteric. Conjunctiva  clear Neck: Supple. Trachea midline Pulmonary:  Good air movement, no use of accessory muscles, respirations not labored.  Cardiac: RRR, No JVD Vascular: Varicosities scattered and measuring up to 1-2 mm in the right lower extremity        Varicosities diffuse and measuring up to 1-2 mm in the left lower extremity Vessel Right Left  Radial Palpable Palpable                          PT Palpable Palpable  DP Palpable Palpable    Musculoskeletal: M/S 5/5 throughout.   No RLE edema.  Trace LLE edema Neurologic: Sensation grossly intact in extremities.  Symmetrical.  Speech is fluent.  Psychiatric: Judgment intact, Mood & affect appropriate for pt's clinical situation. Dermatologic: No rashes or ulcers noted.  No cellulitis or open wounds.   Radiology No results found.  Labs No results found for this or any previous visit (from the past 2160 hour(s)).  Assessment/Plan:  No problem-specific Assessment & Plan notes found for this encounter.   The patient has done their best to comply with conservative therapy of 20-30 mm Hg compression stockings, leg elevation, exercise, and anti-inflammatories as needed for discomfort.  Despite this, they continue to have daily and persistent symptoms from their venous disease.  A venous reflux study demonstrates no DVT or superficial thrombophlebitis and no significant superficial reflux, although some deep venous reflux was seen on the left.  As such, the patient is not likely to benefit from endovenous laser ablation of the great or small saphenous veins.  Sclerotherapy could be performed to alleviate locally bothersome varicosities in both lower extremities as needed.  The patient would like to have this performed.     Leotis Pain 04/17/2017, 9:33 AM

## 2017-04-17 NOTE — Patient Instructions (Signed)
Sclerotherapy Sclerotherapy is a procedure that is done to improve the appearance of varicose veins and spider veins and to help relieve aching, swelling, cramping, and pain in the legs. Varicose veins are veins that have become enlarged, bulging, and twisted due to a damaged valve that causes blood to collect (pool) in the veins. Spider veins are small varicose veins. Sclerotherapy usually works best for smaller spider and varicose veins. This procedure involves injecting a chemical into the vein to close it off. You may need more than one treatment to close a vein all the way. Sclerotherapy is usually performed on the legs because that is where varicose and spider veins most often occur. Tell a health care provider about:  Any allergies you have.  All medicines you are taking, including vitamins, herbs, eye drops, creams, and over-the-counter medicines.  Any blood disorders you have.  Any surgeries you have had.  Any medical conditions you have.  Whether you are pregnant or may be pregnant. What are the risks? Generally, this is a safe procedure. However, problems may occur, including:  Infection.  Bleeding.  Allergic reactions to medicines or dyes.  Blood clots.  Nerve damage.  Bruising and scarring.  Darkened skin around the area.  What happens before the procedure?  Do not use lotions or creams on your legs unless your health care provider approves.  Follow instructions from your health care provider about eating and drinking restrictions.  Do not use any products that contain nicotine or tobacco, such as cigarettes and e-cigarettes. If you need help quitting, ask your health care provider.  Ask your health care provider about: ? Changing or stopping your regular medicines. This is especially important if you are taking diabetes medicines or blood thinners. ? Taking medicines such as aspirin and ibuprofen. These medicines can thin your blood. Do not take these  medicines before your procedure if your health care provider instructs you not to.  You may have an ultrasound of the affected area to check for blood clots and to check blood flow.  In rare cases, you may have an X-ray procedure to check how blood flows through your veins (angiogram). For an angiogram, a dye is injected to outline your veins on X-rays. What happens during the procedure?  To lower your risk of infection: ? Your health care team will wash or sanitize their hands. ? Your skin will be washed with soap. ? Hair may be removed from the treatment area.  A small, thin needle will be used to inject a chemical (sclerosant) into your varicose vein. The sclerosant will irritate the lining of the vein and cause the vein to close below the injection site. You may feel some stinging, burning, or irritation.  The injection may be repeated for more than one varicose vein.  The injection area will be wrapped with elastic bandages. The procedure may vary among health care providers and hospitals. What happens after the procedure?  Your injection area will be wrapped with elastic bandages. If there is bleeding, the bandages may be changed.  Do not drive until your health care provider approves. You may need to wait 1-2 days before driving.  You will need to wear compression stockings for about a week, or as long as your health care provider recommends. Summary  Sclerotherapy is a procedure that is done to improve the appearance of varicose veins and spider veins and to help relieve aching, swelling, cramping, and pain in the legs.  A small, thin needle is   used to inject a chemical (sclerosant) into a spider vein or varicose vein to close it off.  Elastic bandages will be wrapped around the injection area after the procedure. This information is not intended to replace advice given to you by your health care provider. Make sure you discuss any questions you have with your health care  provider. Document Released: 05/13/2016 Document Revised: 05/13/2016 Document Reviewed: 05/13/2016 Elsevier Interactive Patient Education  2018 Elsevier Inc.  

## 2017-04-21 ENCOUNTER — Encounter: Payer: Self-pay | Admitting: Unknown Physician Specialty

## 2017-04-21 ENCOUNTER — Ambulatory Visit: Payer: 59 | Admitting: Unknown Physician Specialty

## 2017-04-21 VITALS — BP 96/64 | HR 77 | Temp 98.2°F | Wt 151.8 lb

## 2017-04-21 DIAGNOSIS — R197 Diarrhea, unspecified: Secondary | ICD-10-CM

## 2017-04-21 DIAGNOSIS — R309 Painful micturition, unspecified: Secondary | ICD-10-CM | POA: Diagnosis not present

## 2017-04-21 DIAGNOSIS — R109 Unspecified abdominal pain: Secondary | ICD-10-CM

## 2017-04-21 MED ORDER — SULFAMETHOXAZOLE-TRIMETHOPRIM 800-160 MG PO TABS: 1.0000 | ORAL_TABLET | Freq: Two times a day (BID) | ORAL | 0 refills | Status: DC

## 2017-04-21 NOTE — Progress Notes (Signed)
BP 96/64   Pulse 77   Temp 98.2 F (36.8 C) (Oral)   Wt 151 lb 12.8 oz (68.9 kg)   SpO2 98%   BMI 25.26 kg/m     Subjective:    Patient ID: Karen Mess., female    DOB: 1970/08/15, 47 y.o.   MRN: [id number]  HPI: Karen Walker is a 47 y.o. female  Chief Complaint  Patient presents with  . Urinary Tract Infection    pt states she has had painful urination since yesterday    Dysuria    This is a new problem. The current episode started yesterday. The problem occurs every urination. The problem has been unchanged. The quality of the pain is described as burning. Associated symptoms include frequency, nausea and a possible pregnancy. Pertinent negatives include no chills, discharge, flank pain, hematuria, hesitancy, sweats, urgency or vomiting. Associated symptoms comments: Diarrhea, weakness, no energy. She has tried nothing for the symptoms.   Shingles Still experiencing itching, some numbness, occasional sharp pain right upper shoulder area  Relevant past medical, surgical, family and social history reviewed and updated as indicated. Interim medical history since our last visit reviewed. Allergies and medications reviewed and updated.  Review of Systems  Constitutional: Negative for chills.  Gastrointestinal: Positive for nausea. Negative for vomiting.  Genitourinary: Positive for dysuria and frequency. Negative for flank pain, hematuria, hesitancy and urgency.    Per HPI unless specifically indicated above     Objective:    BP 96/64   Pulse 77   Temp 98.2 F (36.8 C) (Oral)   Wt 151 lb 12.8 oz (68.9 kg)   SpO2 98%   BMI 25.26 kg/m    Wt Readings from Last 3 Encounters:  04/21/17 151 lb 12.8 oz (68.9 kg)  04/17/17 151 lb 9.6 oz (68.8 kg)  03/18/17 154 lb (69.9 kg)    Physical Exam  Constitutional: She is oriented to person, place, and time. She appears well-developed and well-nourished. No distress.  HENT:  Head: Normocephalic and atraumatic.  Eyes:  Conjunctivae and lids are normal. Right eye exhibits no discharge. Left eye exhibits no discharge. No scleral icterus.  Neck: Normal range of motion. Neck supple. No JVD present. Carotid bruit is not present.  Cardiovascular: Normal rate, regular rhythm and normal heart sounds.  Pulmonary/Chest: Effort normal and breath sounds normal.  Abdominal: Normal appearance. There is no splenomegaly or hepatomegaly.  Musculoskeletal: Normal range of motion.  Neurological: She is alert and oriented to person, place, and time.  Skin: Skin is warm, dry and intact. No rash noted. No pallor.  Psychiatric: She has a normal mood and affect. Her behavior is normal. Judgment and thought content normal.    Results for orders placed or performed in visit on 12/26/16  Microscopic Examination  Result Value Ref Range   WBC, UA 0-5 0 - 5 /hpf   RBC, UA 0-2 0 - 2 /hpf   Epithelial Cells (non renal) 0-10 0 - 10 /hpf   Bacteria, UA None seen None seen/Few  Urine Culture, Reflex  Result Value Ref Range   Urine Culture, Routine Final report    Organism ID, Bacteria Comment   CBC with Differential/Platelet  Result Value Ref Range   WBC 5.4 3.4 - 10.8 x10E3/uL   RBC 4.51 3.77 - 5.28 x10E6/uL   Hemoglobin 13.3 11.1 - 15.9 g/dL   Hematocrit 40.0 34.0 - 46.6 %   MCV 89 79 - 97 fL   MCH 29.5 26.6 -  33.0 pg   MCHC 33.3 31.5 - 35.7 g/dL   RDW 13.3 12.3 - 15.4 %   Platelets 249 150 - 379 x10E3/uL   Neutrophils 49 Not Estab. %   Lymphs 41 Not Estab. %   Monocytes 5 Not Estab. %   Eos 5 Not Estab. %   Basos 0 Not Estab. %   Neutrophils Absolute 2.6 1.4 - 7.0 x10E3/uL   Lymphocytes Absolute 2.2 0.7 - 3.1 x10E3/uL   Monocytes Absolute 0.3 0.1 - 0.9 x10E3/uL   EOS (ABSOLUTE) 0.3 0.0 - 0.4 x10E3/uL   Basophils Absolute 0.0 0.0 - 0.2 x10E3/uL   Immature Granulocytes 0 Not Estab. %   Immature Grans (Abs) 0.0 0.0 - 0.1 x10E3/uL  Comprehensive metabolic panel  Result Value Ref Range   Glucose 64 (L) 65 - 99 mg/dL    BUN 12 6 - 24 mg/dL   Creatinine, Ser 0.65 0.57 - 1.00 mg/dL   GFR calc non Af Amer 107 >59 mL/min/1.73   GFR calc Af Amer 123 >59 mL/min/1.73   BUN/Creatinine Ratio 18 9 - 23   Sodium 139 134 - 144 mmol/L   Potassium 3.9 3.5 - 5.2 mmol/L   Chloride 99 96 - 106 mmol/L   CO2 26 20 - 29 mmol/L   Calcium 9.9 8.7 - 10.2 mg/dL   Total Protein 7.6 6.0 - 8.5 g/dL   Albumin 4.6 3.5 - 5.5 g/dL   Globulin, Total 3.0 1.5 - 4.5 g/dL   Albumin/Globulin Ratio 1.5 1.2 - 2.2   Bilirubin Total 0.4 0.0 - 1.2 mg/dL   Alkaline Phosphatase 66 39 - 117 IU/L   AST 19 0 - 40 IU/L   ALT 16 0 - 32 IU/L  Lipid Panel w/o Chol/HDL Ratio  Result Value Ref Range   Cholesterol, Total 181 100 - 199 mg/dL   Triglycerides 135 0 - 149 mg/dL   HDL 56 >39 mg/dL   VLDL Cholesterol Cal 27 5 - 40 mg/dL   LDL Calculated 98 0 - 99 mg/dL  TSH  Result Value Ref Range   TSH 1.820 0.450 - 4.500 uIU/mL  UA/M w/rflx Culture, Routine  Result Value Ref Range   Specific Gravity, UA 1.010 1.005 - 1.030   pH, UA 7.0 5.0 - 7.5   Color, UA Yellow Yellow   Appearance Ur Clear Clear   Leukocytes, UA 1+ (A) Negative   Protein, UA Negative Negative/Trace   Glucose, UA Negative Negative   Ketones, UA Negative Negative   RBC, UA Trace (A) Negative   Bilirubin, UA Negative Negative   Urobilinogen, Ur 0.2 0.2 - 1.0 mg/dL   Nitrite, UA Negative Negative   Microscopic Examination See below:    Urinalysis Reflex Comment   Antinuclear Antib (ANA)  Result Value Ref Range   Anit Nuclear Antibody(ANA) Negative Negative  RA Qn+CCP(IgG/A)+SjoSSA+SjoSSB  Result Value Ref Range   Rhuematoid fact SerPl-aCnc <10.0 0.0 - 13.9 IU/mL   ENA SSA (RO) Ab <0.2 0.0 - 0.9 AI   ENA SSB (LA) Ab <0.2 0.0 - 0.9 AI   Cyclic Citrullin Peptide Ab 6 0 - 19 units  Sed Rate (ESR)  Result Value Ref Range   Sed Rate 9 0 - 32 mm/hr  C-reactive protein  Result Value Ref Range   CRP 1.0 0.0 - 4.9 mg/L  Lyme Ab/Western Blot Reflex  Result Value Ref Range    Lyme IgG/IgM Ab <0.91 0.00 - 0.90 ISR   LYME DISEASE AB, QUANT, IGM <0.80 0.00 -  1.11 index  Ehrlichia Antibody Panel  Result Value Ref Range   E.Chaffeensis (HME) IgG Negative Neg:<1:64   E. Chaffeensis (HME) IgM Titer Negative Neg:<1:20   HGE IgG Titer Negative Neg:<1:64   HGE IgM Titer Negative Neg:<1:20  Babesia microti Antibody Panel  Result Value Ref Range   Babesia microti IgM <1:10 Neg:<1:10   Babesia microti IgG <1:10 Neg:<1:10  Rocky mtn spotted fvr abs pnl(IgG+IgM)  Result Value Ref Range   RMSF IgG Negative Negative   RMSF IgM 0.40 0.00 - 0.89 index  VITAMIN D 25 Hydroxy (Vit-D Deficiency, Fractures)  Result Value Ref Range   Vit D, 25-Hydroxy 31.8 30.0 - 100.0 ng/mL  +   Assessment & Plan:   Problem List Items Addressed This Visit    None    Visit Diagnoses    Painful urination    -  Primary   Relevant Orders   UA/M w/rflx Culture, Routine   Diarrhea, unspecified type       Supportive care.  Increase fluids   Abdominal cramping       Secondary to UTI.  Rx for Septra        Follow up plan: Return if symptoms worsen or fail to improve.

## 2017-04-22 LAB — MICROSCOPIC EXAMINATION

## 2017-04-23 ENCOUNTER — Other Ambulatory Visit: Payer: Self-pay | Admitting: Unknown Physician Specialty

## 2017-04-23 LAB — UA/M W/RFLX CULTURE, ROUTINE
BILIRUBIN UA: NEGATIVE
Glucose, UA: NEGATIVE
KETONES UA: NEGATIVE
Nitrite, UA: NEGATIVE
PH UA: 7 (ref 5.0–7.5)
Protein, UA: NEGATIVE
Specific Gravity, UA: 1.015 (ref 1.005–1.030)
Urobilinogen, Ur: 0.2 mg/dL (ref 0.2–1.0)

## 2017-04-23 LAB — URINE CULTURE, REFLEX

## 2017-04-23 MED ORDER — AMOXICILLIN 875 MG PO TABS: 875.0000 mg | ORAL_TABLET | Freq: Two times a day (BID) | ORAL | 0 refills | Status: DC

## 2017-05-18 ENCOUNTER — Telehealth: Payer: Self-pay | Admitting: Family Medicine

## 2017-05-18 ENCOUNTER — Ambulatory Visit (INDEPENDENT_AMBULATORY_CARE_PROVIDER_SITE_OTHER): Payer: 59 | Admitting: Vascular Surgery

## 2017-05-18 MED ORDER — OSELTAMIVIR PHOSPHATE 75 MG PO CAPS: 75.0000 mg | ORAL_CAPSULE | Freq: Every day | ORAL | 0 refills | Status: DC

## 2017-05-18 NOTE — Telephone Encounter (Signed)
Daughter tested positive for influenza A- will send in prophylaxis.

## 2017-05-26 ENCOUNTER — Telehealth: Payer: Self-pay | Admitting: Family Medicine

## 2017-05-26 MED ORDER — OSELTAMIVIR PHOSPHATE 75 MG PO CAPS: 75.0000 mg | ORAL_CAPSULE | Freq: Every day | ORAL | 0 refills | Status: DC

## 2017-05-26 NOTE — Telephone Encounter (Signed)
Son tested positive for influenza A- out of her tamiflu

## 2017-05-28 ENCOUNTER — Encounter (INDEPENDENT_AMBULATORY_CARE_PROVIDER_SITE_OTHER): Payer: Self-pay | Admitting: Vascular Surgery

## 2017-05-28 ENCOUNTER — Ambulatory Visit (INDEPENDENT_AMBULATORY_CARE_PROVIDER_SITE_OTHER): Payer: 59 | Admitting: Vascular Surgery

## 2017-05-28 DIAGNOSIS — I83813 Varicose veins of bilateral lower extremities with pain: Secondary | ICD-10-CM | POA: Diagnosis not present

## 2017-05-28 NOTE — Progress Notes (Signed)
Varicose veins of left lower extremity with inflammation (454.1  I83.10) Current Plans   Indication: Patient presents with symptomatic varicose veins of the left lower extremity.   Procedure: Sclerotherapy using hypertonic saline mixed with 1% Lidocaine was performed on the left lower extremity. Compression wraps were placed. The patient tolerated the procedure well. 

## 2017-06-08 ENCOUNTER — Encounter (INDEPENDENT_AMBULATORY_CARE_PROVIDER_SITE_OTHER): Payer: Self-pay | Admitting: Vascular Surgery

## 2017-06-08 ENCOUNTER — Ambulatory Visit (INDEPENDENT_AMBULATORY_CARE_PROVIDER_SITE_OTHER): Payer: 59 | Admitting: Vascular Surgery

## 2017-06-08 VITALS — BP 104/69 | HR 67 | Resp 17 | Wt 148.0 lb

## 2017-06-08 DIAGNOSIS — I83813 Varicose veins of bilateral lower extremities with pain: Secondary | ICD-10-CM

## 2017-06-08 NOTE — Progress Notes (Signed)
Varicose veins of left lower extremity with inflammation (454.1  I83.10) Current Plans   Indication: Patient presents with symptomatic varicose veins of the left lower extremity.   Procedure: Sclerotherapy using hypertonic saline mixed with 1% Lidocaine was performed on the left lower extremity. Compression wraps were placed. The patient tolerated the procedure well. 

## 2017-06-11 ENCOUNTER — Telehealth: Payer: Self-pay | Admitting: Family Medicine

## 2017-06-11 MED ORDER — OSELTAMIVIR PHOSPHATE 75 MG PO CAPS: 75.0000 mg | ORAL_CAPSULE | Freq: Every day | ORAL | 0 refills | Status: DC

## 2017-06-11 NOTE — Telephone Encounter (Signed)
Daughter tested positive for flu. Prophylaxis sent to her pharmacy.

## 2017-06-19 ENCOUNTER — Ambulatory Visit (INDEPENDENT_AMBULATORY_CARE_PROVIDER_SITE_OTHER): Payer: Self-pay | Admitting: Medical

## 2017-06-19 ENCOUNTER — Encounter: Payer: Self-pay | Admitting: Medical

## 2017-06-19 VITALS — BP 110/52 | HR 92 | Temp 97.9°F | Wt 148.0 lb

## 2017-06-19 DIAGNOSIS — B354 Tinea corporis: Secondary | ICD-10-CM

## 2017-06-19 DIAGNOSIS — R5081 Fever presenting with conditions classified elsewhere: Secondary | ICD-10-CM

## 2017-06-19 LAB — POCT INFLUENZA A/B
INFLUENZA A, POC: NEGATIVE
Influenza B, POC: NEGATIVE

## 2017-06-19 MED ORDER — NYSTATIN 100000 UNIT/GM EX CREA: 1.0000 "application " | TOPICAL_CREAM | Freq: Two times a day (BID) | CUTANEOUS | 0 refills | Status: DC

## 2017-06-19 NOTE — Patient Instructions (Addendum)
Cover the areas with a bandage after applying cream as directed. Take OTC Zyrtec take as directed.   Return in one  week if not improving.     Body Ringworm Body ringworm is an infection of the skin that often causes a ring-shaped rash. Body ringworm can affect any part of your skin. It can spread easily to others. Body ringworm is also called tinea corporis. What are the causes? This condition is caused by funguses called dermatophytes. The condition develops when these funguses grow out of control on the skin. You can get this condition if you touch a person or animal that has it. You can also get it if you share clothing, bedding, towels, or any other object with an infected person or pet. What increases the risk? This condition is more likely to develop in:  Athletes who often make skin-to-skin contact with other athletes, such as wrestlers.  People who share equipment and mats.  People with a weakened immune system.  What are the signs or symptoms? Symptoms of this condition include:  Itchy, raised red spots and bumps.  Red scaly patches.  A ring-shaped rash. The rash may have: ? A clear center. ? Scales or red bumps at its center. ? Redness near its borders. ? Dry and scaly skin on or around it.  How is this diagnosed? This condition can usually be diagnosed with a skin exam. A skin scraping may be taken from the affected area and examined under a microscope to see if the fungus is present. How is this treated? This condition may be treated with:  An antifungal cream or ointment.  An antifungal shampoo.  Antifungal medicines. These may be prescribed if your ringworm is severe, keeps coming back, or lasts a long time.  Follow these instructions at home:  Take over-the-counter and prescription medicines only as told by your health care provider.  If you were given an antifungal cream or ointment: ? Use it as told by your health care provider. ? Wash the infected  area and dry it completely before applying the cream or ointment.  If you were given an antifungal shampoo: ? Use it as told by your health care provider. ? Leave the shampoo on your body for 3-5 minutes before rinsing.  While you have a rash: ? Wear loose clothing to stop clothes from rubbing and irritating it. ? Wash or change your bed sheets every night.  If your pet has the same infection, take your pet to see a Animal nutritionist. How is this prevented?  Practice good hygiene.  Wear sandals or shoes in public places and showers.  Do not share personal items with others.  Avoid touching red patches of skin on other people.  Avoid touching pets that have bald spots.  If you touch an animal that has a bald spot, wash your hands. Contact a health care provider if:  Your rash continues to spread after 7 days of treatment.  Your rash is not gone in 4 weeks.  The area around your rash gets red, warm, tender, and swollen. This information is not intended to replace advice given to you by your health care provider. Make sure you discuss any questions you have with your health care provider. Document Released: 03/21/2000 Document Revised: 08/30/2015 Document Reviewed: 01/18/2015 Elsevier Interactive Patient Education  [employer].

## 2017-06-19 NOTE — Progress Notes (Signed)
   Subjective:    Patient ID: Karen Mess., female    DOB: March 30, 1971, 47 y.o.   MRN: [id number]  HPI 47 yo hispanic female in non acute stress.  Rash coming and going on right side  Back (x2 weeks) , left side of face and left upper arm and left wrist area x one week ago. Itchy, burns after itching the area. History of shingles in December 2018.wondering if she has it again. Husband also sick with the flu 2 weeks ago , she complains of mild sore throat, mild cough and mild body aches.  She did have a flu vaccine however her husband did not. She feels she may have gotten the flu but it was not that bad. Presents with daughter. Works as a Training and development officer at [employer].  Review of Systems  Constitutional: Negative for chills, fatigue and fever.  HENT: Positive for congestion, rhinorrhea (little) and sore throat (mild).   Respiratory: Positive for cough (mild). Negative for shortness of breath.   Cardiovascular: Negative for chest pain.  Musculoskeletal: Positive for myalgias (a little).  Skin: Positive for rash.  Allergic/Immunologic: Positive for environmental allergies.  Hematological: Negative for adenopathy.       Objective:   Physical Exam  Constitutional: She is oriented to person, place, and time. She appears well-developed and well-nourished.  HENT:  Head: Normocephalic and atraumatic.  Right Ear: Hearing, tympanic membrane, external ear and ear canal normal.  Left Ear: Hearing, tympanic membrane, external ear and ear canal normal.  Nose: Rhinorrhea (mild) present.  Mouth/Throat: Uvula is midline, oropharynx is clear and moist and mucous membranes are normal. No oropharyngeal exudate, posterior oropharyngeal edema or posterior oropharyngeal erythema.  Eyes: Conjunctivae and EOM are normal. Pupils are equal, round, and reactive to light.  Neck: Normal range of motion. Neck supple.  Cardiovascular: Normal rate, regular rhythm and normal heart sounds.  Pulmonary/Chest: Effort normal and  breath sounds normal.  Lymphadenopathy:    She has no cervical adenopathy.  Neurological: She is alert and oriented to person, place, and time.  Skin: Skin is warm and dry.  Psychiatric: She has a normal mood and affect. Her behavior is normal. Judgment and thought content normal.  Nursing note and vitals reviewed.  Mild vascularity noted in nostrils,  Circular scaly mild erythema right side of upper back (2cm) approximately  and 2 areas on left wrist 1.5 cm  that is circular One circular area on the left upper arm 1cm.   Flu negatitve Assessment & Plan:  Skin rash looks fungal in nature. OTC Zyrtec take as directed for itching and allergy symptoms. Patient preferred [place] Language paperwork. Return in one week if not improving. Reviewed with patient how to use cream twice daily. Meds ordered this encounter  Medications  . nystatin cream (MYCOSTATIN)    Sig: Apply 1 application topically 2 (two) times daily. For two weeks to affected areas    Dispense:  30 g    Refill:  0

## 2017-06-23 ENCOUNTER — Telehealth: Payer: Self-pay | Admitting: Emergency Medicine

## 2017-06-23 NOTE — Telephone Encounter (Signed)
Per patient appointment with her PCP 06/24/2017 Stated  rash hasnt gone away. Informed her to let us know how she is doing.

## 2017-06-23 NOTE — Telephone Encounter (Signed)
Her treatment needs to be twice daily x 2 weeks before it resolves. It is a  fungal infection. By week one it should be a little lighter in color ,but not resolved. Please call the patient and explain this to her. I did review it with her during her visit. Thank you

## 2017-06-24 ENCOUNTER — Encounter: Payer: Self-pay | Admitting: Family Medicine

## 2017-06-24 ENCOUNTER — Ambulatory Visit: Payer: 59 | Admitting: Family Medicine

## 2017-06-24 VITALS — BP 93/63 | HR 76 | Temp 98.3°F | Wt 148.2 lb

## 2017-06-24 DIAGNOSIS — L509 Urticaria, unspecified: Secondary | ICD-10-CM | POA: Diagnosis not present

## 2017-06-24 DIAGNOSIS — N952 Postmenopausal atrophic vaginitis: Secondary | ICD-10-CM | POA: Diagnosis not present

## 2017-06-24 MED ORDER — ESTROGENS, CONJUGATED 0.625 MG/GM VA CREA: TOPICAL_CREAM | VAGINAL | 12 refills | Status: DC

## 2017-06-24 MED ORDER — PREDNISONE 10 MG PO TABS: ORAL_TABLET | ORAL | 0 refills | Status: DC

## 2017-06-24 MED ORDER — DIPHENHYDRAMINE HCL 25 MG PO TABS: 25.0000 mg | ORAL_TABLET | Freq: Four times a day (QID) | ORAL | 3 refills | Status: DC | PRN

## 2017-06-24 NOTE — Progress Notes (Signed)
BP 93/63 (BP Location: Left Arm, Patient Position: Sitting, Cuff Size: Normal)   Pulse 76   Temp 98.3 F (36.8 C)   Wt 148 lb 3 oz (67.2 kg)   SpO2 98%   BMI 24.66 kg/m    Subjective:    Patient ID: Karen Mess., female    DOB: 08/20/1970, 47 y.o.   MRN: [id number]  HPI: Karen Walker is a 47 y.o. female  Chief Complaint  Patient presents with  . Rash    Patient states that it is worse than last week, it itches then hurts   RASH Duration:  1.5 weeks  Location: generalized  Itching: yes Burning: yes Redness: yes Oozing: no Scaling: yes Blisters: no Painful: yes Fevers: no Change in detergents/soaps/personal care products: yes- did not rinse clothes as throughly as usual Recent illness: no Recent travel:no History of same: no Context: worse Alleviating factors: nothing Treatments attempted:nothing Shortness of breath: no  Throat/tongue swelling: no Myalgias/arthralgias: no  MENOPAUSAL SYMPTOMS Duration: chronic, since hysterectomy and oopherectomy Symptom severity: moderate Hot flashes: yes Night sweats: yes Sleep disturbances: yes Vaginal dryness: yes Dyspareunia:yes Decreased libido: yes Emotional lability: yes Stress incontinence: yes Previous HRT/pharmacotherapy: no Hysterectomy: yes Absolute Contraindications to Hormonal Therapy: No family history of any of the below either    Undiagnosed vaginal bleeding: no    Breast cancer: no    Endometrial cancer: no    Coronary disease: no    Cerebrovascular disease: no    Venous thromboembolic disease: no   Relevant past medical, surgical, family and social history reviewed and updated as indicated. Interim medical history since our last visit reviewed. Allergies and medications reviewed and updated.  Review of Systems  Constitutional: Negative.   Respiratory: Negative.   Cardiovascular: Negative.   Genitourinary: Positive for dyspareunia, pelvic pain and vaginal pain. Negative for decreased  urine volume, difficulty urinating, dysuria, enuresis, flank pain, frequency, genital sores, hematuria, menstrual problem, urgency, vaginal bleeding and vaginal discharge.  Neurological: Negative.   Psychiatric/Behavioral: Negative.     Per HPI unless specifically indicated above     Objective:    BP 93/63 (BP Location: Left Arm, Patient Position: Sitting, Cuff Size: Normal)   Pulse 76   Temp 98.3 F (36.8 C)   Wt 148 lb 3 oz (67.2 kg)   SpO2 98%   BMI 24.66 kg/m   Wt Readings from Last 3 Encounters:  06/24/17 148 lb 3 oz (67.2 kg)  06/19/17 148 lb (67.1 kg)  06/08/17 148 lb (67.1 kg)    Physical Exam  Constitutional: She is oriented to person, place, and time. She appears well-developed and well-nourished. No distress.  HENT:  Head: Normocephalic and atraumatic.  Right Ear: Hearing normal.  Left Ear: Hearing normal.  Nose: Nose normal.  Eyes: Conjunctivae and lids are normal. Right eye exhibits no discharge. Left eye exhibits no discharge. No scleral icterus.  Cardiovascular: Normal rate, regular rhythm, normal heart sounds and intact distal pulses. Exam reveals no gallop and no friction rub.  No murmur heard. Pulmonary/Chest: Effort normal and breath sounds normal. No respiratory distress. She has no wheezes. She has no rales. She exhibits no tenderness.  Musculoskeletal: Normal range of motion.  Neurological: She is alert and oriented to person, place, and time.  Skin: Skin is warm, dry and intact. Rash noted. She is not diaphoretic. No erythema. No pallor.  Urticaria on arms, chest and back  Psychiatric: She has a normal mood and affect. Her speech is normal and  behavior is normal. Judgment and thought content normal. Cognition and memory are normal.  Nursing note and vitals reviewed.   Results for orders placed or performed in visit on 06/19/17  POCT Influenza A/B  Result Value Ref Range   Influenza A, POC Negative Negative   Influenza B, POC Negative Negative       Assessment & Plan:   Problem List Items Addressed This Visit      Genitourinary   Vaginal atrophy    No contraindications to premarin. Will start it and recheck in 1 month. Call with any concerns.        Other Visit Diagnoses    Hives    -  Primary   Likely due to detergent. Will treat with prednisone taper and benadryl. Call if not getting better or getting worse.       Follow up plan: Return in about 4 weeks (around 07/22/2017) for follow up vaginal dryness.

## 2017-06-24 NOTE — Assessment & Plan Note (Signed)
No contraindications to premarin. Will start it and recheck in 1 month. Call with any concerns.

## 2017-07-06 ENCOUNTER — Encounter (INDEPENDENT_AMBULATORY_CARE_PROVIDER_SITE_OTHER): Payer: Self-pay | Admitting: Vascular Surgery

## 2017-07-06 ENCOUNTER — Ambulatory Visit (INDEPENDENT_AMBULATORY_CARE_PROVIDER_SITE_OTHER): Payer: 59 | Admitting: Vascular Surgery

## 2017-07-06 VITALS — BP 99/68 | HR 72 | Resp 16 | Ht 64.0 in | Wt 147.0 lb

## 2017-07-06 DIAGNOSIS — I83813 Varicose veins of bilateral lower extremities with pain: Secondary | ICD-10-CM

## 2017-07-06 NOTE — Progress Notes (Signed)
Varicose veins of left lower extremity with inflammation (454.1  I83.10) Current Plans   Indication: Patient presents with symptomatic varicose veins of the left lower extremity.   Procedure: Sclerotherapy using hypertonic saline mixed with 1% Lidocaine was performed on the left lower extremity. Compression wraps were placed. The patient tolerated the procedure well. 

## 2017-07-22 ENCOUNTER — Ambulatory Visit: Payer: 59 | Admitting: Family Medicine

## 2017-08-13 ENCOUNTER — Encounter: Payer: Self-pay | Admitting: Family Medicine

## 2017-08-13 ENCOUNTER — Ambulatory Visit: Payer: 59 | Admitting: Family Medicine

## 2017-08-13 VITALS — BP 95/67 | HR 78 | Temp 98.5°F | Wt 148.8 lb

## 2017-08-13 DIAGNOSIS — N952 Postmenopausal atrophic vaginitis: Secondary | ICD-10-CM

## 2017-08-13 DIAGNOSIS — L247 Irritant contact dermatitis due to plants, except food: Secondary | ICD-10-CM

## 2017-08-13 DIAGNOSIS — L509 Urticaria, unspecified: Secondary | ICD-10-CM | POA: Diagnosis not present

## 2017-08-13 MED ORDER — TRIAMCINOLONE ACETONIDE 0.5 % EX OINT: 1.0000 "application " | TOPICAL_OINTMENT | Freq: Two times a day (BID) | CUTANEOUS | 1 refills | Status: DC

## 2017-08-13 MED ORDER — PREDNISONE 50 MG PO TABS: 50.0000 mg | ORAL_TABLET | Freq: Every day | ORAL | 0 refills | Status: DC

## 2017-08-13 NOTE — Progress Notes (Signed)
BP 95/67   Pulse 78   Temp 98.5 F (36.9 C) (Oral)   Wt 148 lb 12.8 oz (67.5 kg)   SpO2 98%   BMI 25.54 kg/m    Subjective:    Patient ID: Karen Mess., female    DOB: 1971/02/10, 47 y.o.   MRN: [id number]  HPI: Karen Walker is a 47 y.o. female  Chief Complaint  Patient presents with  . vaginal dryness    follow up  . Poison Ivy    pt states she has had poison ivy for over a week   VAGINAL ATROPHY- was supposed to be started on premarin a month ago, but hasn't picked it up because of cost Duration: chronic, since hysterectomy and oopherectomy Symptom severity: moderate Hot flashes: yes Night sweats: yes Sleep disturbances: no Vaginal dryness: yes Dyspareunia:yes Decreased libido: yes Emotional lability: yes Stress incontinence: yes Previous HRT/pharmacotherapy: no Hysterectomy: yes Absolute Contraindications to Hormonal Therapy:     Undiagnosed vaginal bleeding: no    Breast cancer: no    Endometrial cancer: no    Coronary disease: no    Cerebrovascular disease: no    Venous thromboembolic disease: no  RASH Duration:  1 week  Location: generalized  Itching: yes Burning: yes Redness: yes Oozing: no Scaling: yes Blisters: no Painful: no Fevers: no Change in detergents/soaps/personal care products: no Recent illness: no Recent travel:no History of same: yes Context: stable Alleviating factors: nothing Treatments attempted:nothing Shortness of breath: no  Throat/tongue swelling: no Myalgias/arthralgias: no  Hives keep coming back. Go away for a couple of days with treatment, then come back  Relevant past medical, surgical, family and social history reviewed and updated as indicated. Interim medical history since our last visit reviewed. Allergies and medications reviewed and updated.  Review of Systems  Constitutional: Negative.   Respiratory: Negative.   Cardiovascular: Negative.   Genitourinary: Positive for pelvic pain. Negative for  decreased urine volume, difficulty urinating, dyspareunia, dysuria, enuresis, flank pain, frequency, genital sores, hematuria, menstrual problem, urgency, vaginal bleeding, vaginal discharge and vaginal pain.  Skin: Positive for rash. Negative for color change, pallor and wound.  Neurological: Negative.   Psychiatric/Behavioral: Negative.     Per HPI unless specifically indicated above     Objective:    BP 95/67   Pulse 78   Temp 98.5 F (36.9 C) (Oral)   Wt 148 lb 12.8 oz (67.5 kg)   SpO2 98%   BMI 25.54 kg/m   Wt Readings from Last 3 Encounters:  08/13/17 148 lb 12.8 oz (67.5 kg)  07/06/17 147 lb (66.7 kg)  06/24/17 148 lb 3 oz (67.2 kg)    Physical Exam  Constitutional: She is oriented to person, place, and time. She appears well-developed and well-nourished. No distress.  HENT:  Head: Normocephalic and atraumatic.  Right Ear: Hearing normal.  Left Ear: Hearing normal.  Nose: Nose normal.  Eyes: Conjunctivae and lids are normal. Right eye exhibits no discharge. Left eye exhibits no discharge. No scleral icterus.  Cardiovascular: Normal rate, regular rhythm, normal heart sounds and intact distal pulses. Exam reveals no gallop and no friction rub.  No murmur heard. Pulmonary/Chest: Effort normal and breath sounds normal. No stridor. No respiratory distress. She has no wheezes. She has no rales. She exhibits no tenderness.  Musculoskeletal: Normal range of motion.  Neurological: She is alert and oriented to person, place, and time.  Skin: Skin is warm, dry and intact. Capillary refill takes less than 2 seconds. Rash (  patches of excoriated erythematous rash on arms, chest, face) noted. She is not diaphoretic. No erythema. No pallor.  Psychiatric: She has a normal mood and affect. Her speech is normal and behavior is normal. Judgment and thought content normal. Cognition and memory are normal.    Results for orders placed or performed in visit on 06/19/17  POCT Influenza A/B    Result Value Ref Range   Influenza A, POC Negative Negative   Influenza B, POC Negative Negative      Assessment & Plan:   Problem List Items Addressed This Visit      Musculoskeletal and Integument   Contact dermatitis - Primary     Genitourinary   Vaginal atrophy    Has not picked up her premarin. Stable. Pick up premarin. Call with any concerns.        Other Visit Diagnoses    Hives       No sign of hives today, but says they keep coming back. Son has idiopathic chronic urticaria. Will get her into allergy for evalutaion.    Relevant Orders   Ambulatory referral to Allergy       Follow up plan: Return 1 month after starting premarin.

## 2017-08-13 NOTE — Assessment & Plan Note (Signed)
Has not picked up her premarin. Stable. Pick up premarin. Call with any concerns.

## 2017-09-22 ENCOUNTER — Encounter: Payer: Self-pay | Admitting: Allergy and Immunology

## 2017-09-22 ENCOUNTER — Ambulatory Visit: Payer: 59 | Admitting: Allergy and Immunology

## 2017-09-22 VITALS — BP 108/68 | HR 68 | Temp 98.1°F | Resp 16 | Ht 63.5 in | Wt 148.6 lb

## 2017-09-22 DIAGNOSIS — H1010 Acute atopic conjunctivitis, unspecified eye: Secondary | ICD-10-CM

## 2017-09-22 DIAGNOSIS — J3089 Other allergic rhinitis: Secondary | ICD-10-CM

## 2017-09-22 DIAGNOSIS — L500 Allergic urticaria: Secondary | ICD-10-CM | POA: Diagnosis not present

## 2017-09-22 MED ORDER — MONTELUKAST SODIUM 10 MG PO TABS: 10.0000 mg | ORAL_TABLET | Freq: Every day | ORAL | 5 refills | Status: DC

## 2017-09-22 MED ORDER — OLOPATADINE HCL 0.2 % OP SOLN: 1.0000 [drp] | Freq: Every day | OPHTHALMIC | 5 refills | Status: DC

## 2017-09-22 NOTE — Progress Notes (Addendum)
Dear Dr. Wynetta Emery,  Thank you for referring Karen Walker to the [facility] on 09/22/2017.   Below is a summation of this patient's evaluation and recommendations.  Thank you for your referral. I will keep you informed about this patient's response to treatment.   If you have any questions please do not hesitate to contact me.   Sincerely,  Jiles Prows, MD Allergy / Immunology [place]   ______________________________________________________________________    NEW PATIENT NOTE  Referring Provider: Valerie Roys, DO Primary Provider: Valerie Roys, DO Date of office visit: 09/22/2017    Subjective:   Chief Complaint:  Karen Walker. (DOB: December 04, 1970) is a 47 y.o. female who presents to the clinic on 09/22/2017 with a chief complaint of Urticaria (onset 2 months) .     HPI: Karen Walker presents to this clinic in evaluation of rash.  She is here today with a Optometrist.  Apparently for the past 2 months she has been having recurrent red raised itchy lesions on her skin that apparently globalized about 2 weeks ago requiring the administration of systemic steroids.  She describes itchy blotchy areas that she excoriates extensively.  These lesions appear to last about 2 or 3 days and then will wax and wane and they may leave behind a brown pigment for a short period in time that quickly resolves over the course of an additional day or so.  There is no associated systemic or constitutional symptoms.  The administration of systemic steroids recently has resulted in almost complete resolution of this issue.  She discontinued the systemic steroids about 5 days ago and she has not had return of this issue yet.  She was also given a cream to apply to these lesions which she did use during the administration of the systemic steroid.  There is no obvious provoking factor giving rise to this  immunological hyperreactivity affecting her skin.  She has not really had a significant environmental change.  However, she has had a dog located inside the household for the past 2 months.  She thinks that she may have had these skin problems prior to introduction of the dog.  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  There has also been the identification of "bugs" in the bed over the course of the past 2 weeks for which all the mattresses have been removed and the carpet has been removed and an exterminator has applied a treatment to the household.  She does have a history of nasal congestion and sneezing and itchy eyes that has been going on for several years occurring on a perennial basis with springtime exacerbation following exposure to the outdoors.  She can smell and taste without any difficulty and does not have a history of recurrent sinus infections or fevers or ugly nasal discharge.  Past Medical History:  Diagnosis Date  . Allergic rhinitis   . Anxiety   . Depression   . History of skin cancer   . Hx of migraine headaches    light sensivity, unilateral HA  . Urticaria     Past Surgical History:  Procedure Laterality Date  . ABDOMINAL HYSTERECTOMY  2014  . BREAST CYST ASPIRATION Right 2013   negative. Done at Dr. Dwyane Luo office  . HERNIA REPAIR  2014  . hysterectemy    .  melonoma removal on R medial arch of foot  2008  . TOTAL ABDOMINAL HYSTERECTOMY W/ BILATERAL SALPINGOOPHORECTOMY    . UMBILICAL HERNIA REPAIR     occurred with hysterectemy    Allergies as of 09/22/2017      Reactions   Codeine Other (See Comments)   Pollen Extract       Medication List      B-12 PO Take by mouth daily.   CALCIUM 600 PO Take 600 mg by mouth daily.   conjugated estrogens vaginal cream Commonly known as:  PREMARIN 1 applicator full daily for 2 weeks, then decrease to 1-3 times a week   diphenhydrAMINE 25 MG tablet Commonly  known as:  BENADRYL ALLERGY Take 1 tablet (25 mg total) by mouth every 6 (six) hours as needed.   predniSONE 50 MG tablet Commonly known as:  DELTASONE Take 1 tablet (50 mg total) by mouth daily with breakfast.   triamcinolone ointment 0.5 % Commonly known as:  KENALOG Apply 1 application topically 2 (two) times daily.       Review of systems negative except as noted in HPI / PMHx or noted below:  Review of Systems  Constitutional: Negative.   HENT: Negative.   Eyes: Negative.   Respiratory: Negative.   Cardiovascular: Negative.   Gastrointestinal: Negative.   Genitourinary: Negative.   Musculoskeletal: Negative.   Skin: Negative.   Neurological: Negative.   Endo/Heme/Allergies: Negative.   Psychiatric/Behavioral: Negative.     Family History  Problem Relation Age of Onset  . Stroke Father   . Asthma Daughter   . Urticaria Son   . Food Allergy Son        red dye, dairy  . Allergic rhinitis Neg Hx   . Angioedema Neg Hx   . Eczema Neg Hx     Social History   Socioeconomic History  . Marital status: Married    Spouse name: Not on file  . Number of children: Not on file  . Years of education: Not on file  . Highest education level: Not on file  Occupational History  . Not on file  Social Needs  . Financial resource strain: Not on file  . Food insecurity:    Worry: Not on file    Inability: Not on file  . Transportation needs:    Medical: Not on file    Non-medical: Not on file  Tobacco Use  . Smoking status: Never Smoker  . Smokeless tobacco: Never Used  Substance and Sexual Activity  . Alcohol use: No  . Drug use: No  . Sexual activity: Yes    Birth control/protection: Surgical  Lifestyle  . Physical activity:    Days per week: Not on file    Minutes per session: Not on file  . Stress: Not on file  Relationships  . Social connections:    Talks on phone: Not on file    Gets together: Not on file    Attends religious service: Not on file     Active member of club or organization: Not on file    Attends meetings of clubs or organizations: Not on file    Relationship status: Not on file  . Intimate partner violence:    Fear of current or ex partner: Not on file    Emotionally abused: Not on file    Physically abused: Not on file    Forced sexual activity: Not on file  Other Topics Concern  . Not on file  Social  History Narrative  . Not on file    Environmental and Social history  Lives in a mobile home with a dry environment, a dog located inside the household, carpet in the bedroom, no plastic on the bed, no plastic on the pillow, and no smokers located inside the household.  She is a Training and development officer.  Objective:   Vitals:   09/22/17 0807  BP: 108/68  Pulse: 68  Resp: 16  Temp: 98.1 F (36.7 C)   Height: 5' 3.5" (161.3 cm) Weight: 148 lb 9.6 oz (67.4 kg)  Physical Exam  HENT:  Head: Normocephalic. Head is without right periorbital erythema and without left periorbital erythema.  Right Ear: Tympanic membrane, external ear and ear canal normal.  Left Ear: Tympanic membrane, external ear and ear canal normal.  Nose: Nose normal. No mucosal edema or rhinorrhea.  Mouth/Throat: Oropharynx is clear and moist and mucous membranes are normal. No oropharyngeal exudate.  Eyes: Pupils are equal, round, and reactive to light. Conjunctivae and lids are normal.  Neck: Trachea normal. No tracheal deviation present. No thyromegaly present.  Cardiovascular: Normal rate, regular rhythm, S1 normal, S2 normal and normal heart sounds.  No murmur heard. Pulmonary/Chest: Effort normal. No stridor. No respiratory distress. She has no wheezes. She has no rales. She exhibits no tenderness.  Abdominal: Soft. She exhibits no distension and no mass. There is no hepatosplenomegaly. There is no tenderness. There is no rebound and no guarding.  Musculoskeletal: She exhibits no edema or tenderness.  Lymphadenopathy:       Head (right side): No tonsillar  adenopathy present.       Head (left side): No tonsillar adenopathy present.    She has no cervical adenopathy.    She has no axillary adenopathy.  Neurological: She is alert.  Skin: No rash noted. She is not diaphoretic. No erythema. No pallor. Nails show no clubbing.    Diagnostics: Allergy skin tests were performed.  She demonstrated hypersensitivity against grasses, weeds, trees, and house dust mite.  Results of blood tests obtained 26 December 2016 identified normal hepatic and renal function, WBC 5.4, absolute eosinophil 300, absolute lymphocyte 2200, hemoglobin 13.3, platelet 249, TSH 1.820 IU/mL, negative ANA, CCP 6 units, RA latex less than 10 IU/mL, negative Lyme screen, negative ehrlichia screen  Assessment and Plan:    1. Allergic urticaria   2. Other allergic rhinitis   3. Seasonal allergic conjunctivitis     1.  Allergen avoidance measures  2.  Treat and prevent inflammation:   A.  Montelukast 10 mg tablet daily  B.  OTC Nasacort 1 spray each nostril daily  3.  If needed:   A.  Cetirizine 10 mg tablet -1-2 tablets 1-2 times per day (maximum = 40mg )  B.  Continue triamcinolone ointment 0.5% applied 1-2 times per day  C.  Pataday 1 drop each eye 1 time per day  4.  Further evaluation?  Yes if recurrent rash  5.  Return to clinic in 4 weeks or earlier if problem  Karen Walker has a overactive immune system with an atopic phenotype and I will assume at this point in time that her cutaneous abnormality is a result of this overactivity and we will get her to perform allergen avoidance measures as best as possible and we will treat her inflammatory condition of her respiratory track that results from this atopic disease.  I would like for her to remain on some dose of antihistamine whether that be 10 through 40 mg of cetirizine  per day.  I am not sure that there is a significant inflammatory component to this dermatitis but she is welcome to continue with triamcinolone ointment  if needed.  I will see her back in this clinic in 4 weeks to assess her response to treatment and consider further evaluation and treatment based upon this response.  Jiles Prows, MD Allergy / Immunology [facility]

## 2017-09-22 NOTE — Patient Instructions (Addendum)
  1.  Allergen avoidance measures  2.  Treat and prevent inflammation:   A.  Montelukast 10 mg tablet daily  B.  OTC Nasacort 1 spray each nostril daily  3.  If needed:   A.  Cetirizine 10 mg tablet -1-2 tablets 1-2 times per day (maximum = 40mg )  B.  Continue triamcinolone ointment 0.5% applied 1-2 times per day  C.  Pataday 1 drop each eye 1 time per day  4.  Further evaluation?  Yes if recurrent rash  5.  Return to clinic in 4 weeks or earlier if problem

## 2017-09-23 ENCOUNTER — Encounter: Payer: Self-pay | Admitting: Allergy and Immunology

## 2017-10-19 ENCOUNTER — Ambulatory Visit: Payer: 59 | Admitting: Physician Assistant

## 2017-10-19 ENCOUNTER — Encounter: Payer: Self-pay | Admitting: Physician Assistant

## 2017-10-19 VITALS — BP 97/64 | HR 81 | Temp 97.9°F | Ht 63.0 in | Wt 147.8 lb

## 2017-10-19 DIAGNOSIS — R300 Dysuria: Secondary | ICD-10-CM

## 2017-10-19 LAB — MICROSCOPIC EXAMINATION

## 2017-10-19 LAB — UA/M W/RFLX CULTURE, ROUTINE
Bilirubin, UA: NEGATIVE
Glucose, UA: NEGATIVE
Ketones, UA: NEGATIVE
Nitrite, UA: NEGATIVE
Protein, UA: NEGATIVE
Specific Gravity, UA: 1.01 (ref 1.005–1.030)
Urobilinogen, Ur: 0.2 mg/dL (ref 0.2–1.0)
pH, UA: 7 (ref 5.0–7.5)

## 2017-10-19 MED ORDER — SULFAMETHOXAZOLE-TRIMETHOPRIM 800-160 MG PO TABS: 1.0000 | ORAL_TABLET | Freq: Two times a day (BID) | ORAL | 0 refills | Status: DC

## 2017-10-19 NOTE — Progress Notes (Signed)
   Subjective:    Patient ID: Karen Mess., female    DOB: [birth year], 47 y.o.   MRN: [id number]  Karen Walker is a 47 y.o. female presenting on 10/19/2017 for Dysuria (Ongoing for 1 week. Patient brough amoxicillin from a previous visit, and wants to know if she can start that again.) and Urinary Urgency   HPI   Dysuria x 1 week. Denies fevers, chills, abdominal pain. Denies nausea and vomiting. Denies hematuria. Endorses frequency. She has an old prescription for amoxicillin 875 mg that she asks if she can   Social History   Tobacco Use  . Smoking status: Never Smoker  . Smokeless tobacco: Never Used  Substance Use Topics  . Alcohol use: No  . Drug use: No    Review of Systems Per HPI unless specifically indicated above     Objective:    BP 97/64 (BP Location: Left Arm, Patient Position: Sitting, Cuff Size: Normal)   Pulse 81   Temp 97.9 F (36.6 C) (Oral)   Ht 5\' 3"  (1.6 m)   Wt 147 lb 12.8 oz (67 kg)   SpO2 99%   BMI 26.18 kg/m   Wt Readings from Last 3 Encounters:  10/19/17 147 lb 12.8 oz (67 kg)  09/22/17 148 lb 9.6 oz (67.4 kg)  08/13/17 148 lb 12.8 oz (67.5 kg)    Physical Exam  Constitutional: She is oriented to person, place, and time. She appears well-developed and well-nourished. No distress.  Cardiovascular: Normal rate and regular rhythm.  Pulmonary/Chest: Effort normal and breath sounds normal.  Abdominal: Soft. Bowel sounds are normal. She exhibits no distension. There is tenderness in the suprapubic area. There is no rebound, no guarding and no CVA tenderness.  Neurological: She is alert and oriented to person, place, and time.  Skin: Skin is warm and dry. She is not diaphoretic.  Psychiatric: She has a normal mood and affect. Her behavior is normal.   Results for orders placed or performed in visit on 06/19/17  POCT Influenza A/B  Result Value Ref Range   Influenza A, POC Negative Negative   Influenza B, POC Negative Negative        Assessment & Plan:   1. Dysuria  Some blood and white cells on urine dipstick today. Will send in Bactrim. Patient seems to be concerned about being able to pick this medication up. Have told her it is reasonable to wait for culture and check if amoxicillin would be effective, but she may need extended dose.  - UA/M w/rflx Culture, Routine - sulfamethoxazole-trimethoprim (BACTRIM DS,SEPTRA DS) 800-160 MG tablet; Take 1 tablet by mouth 2 (two) times daily.  Dispense: 6 tablet; Refill: 0    Follow up plan: Return if symptoms worsen or fail to improve.  Carles Collet, PA-C [facility] 10/19/2017, 2:43 PM

## 2017-10-19 NOTE — Patient Instructions (Signed)

## 2017-10-22 LAB — CULTURE, URINE COMPREHENSIVE

## 2017-10-27 ENCOUNTER — Ambulatory Visit: Payer: 59 | Admitting: Allergy and Immunology

## 2017-10-28 DIAGNOSIS — D1800 Hemangioma unspecified site: Secondary | ICD-10-CM | POA: Diagnosis not present

## 2017-10-28 DIAGNOSIS — L812 Freckles: Secondary | ICD-10-CM | POA: Diagnosis not present

## 2017-10-28 DIAGNOSIS — L578 Other skin changes due to chronic exposure to nonionizing radiation: Secondary | ICD-10-CM | POA: Diagnosis not present

## 2017-10-28 DIAGNOSIS — L821 Other seborrheic keratosis: Secondary | ICD-10-CM | POA: Diagnosis not present

## 2017-10-28 DIAGNOSIS — Z1283 Encounter for screening for malignant neoplasm of skin: Secondary | ICD-10-CM | POA: Diagnosis not present

## 2017-10-28 DIAGNOSIS — Z85820 Personal history of malignant melanoma of skin: Secondary | ICD-10-CM | POA: Diagnosis not present

## 2017-10-28 DIAGNOSIS — D229 Melanocytic nevi, unspecified: Secondary | ICD-10-CM | POA: Diagnosis not present

## 2017-10-28 DIAGNOSIS — D2230 Melanocytic nevi of unspecified part of face: Secondary | ICD-10-CM | POA: Diagnosis not present

## 2017-10-28 DIAGNOSIS — D225 Melanocytic nevi of trunk: Secondary | ICD-10-CM | POA: Diagnosis not present

## 2018-01-12 ENCOUNTER — Encounter: Payer: Self-pay | Admitting: Family Medicine

## 2018-01-12 ENCOUNTER — Ambulatory Visit: Payer: 59 | Admitting: Family Medicine

## 2018-01-12 VITALS — BP 101/70 | HR 84 | Temp 97.7°F | Wt 148.2 lb

## 2018-01-12 DIAGNOSIS — M79671 Pain in right foot: Secondary | ICD-10-CM

## 2018-01-12 DIAGNOSIS — K120 Recurrent oral aphthae: Secondary | ICD-10-CM

## 2018-01-12 DIAGNOSIS — Z85820 Personal history of malignant melanoma of skin: Secondary | ICD-10-CM

## 2018-01-12 DIAGNOSIS — R309 Painful micturition, unspecified: Secondary | ICD-10-CM

## 2018-01-12 LAB — MICROSCOPIC EXAMINATION: Bacteria, UA: NONE SEEN

## 2018-01-12 LAB — UA/M W/RFLX CULTURE, ROUTINE
Bilirubin, UA: NEGATIVE
Glucose, UA: NEGATIVE
Ketones, UA: NEGATIVE
Nitrite, UA: NEGATIVE
Protein, UA: NEGATIVE
RBC UA: NEGATIVE
Specific Gravity, UA: 1.015 (ref 1.005–1.030)
Urobilinogen, Ur: 0.2 mg/dL (ref 0.2–1.0)
pH, UA: 8.5 — ABNORMAL HIGH (ref 5.0–7.5)

## 2018-01-12 MED ORDER — ESTROGENS, CONJUGATED 0.625 MG/GM VA CREA: TOPICAL_CREAM | VAGINAL | 12 refills | Status: DC

## 2018-01-12 NOTE — Progress Notes (Signed)
BP 101/70   Pulse 84   Temp 97.7 F (36.5 C) (Oral)   Wt 148 lb 3.2 oz (67.2 kg)   SpO2 97%   BMI 26.25 kg/m    Subjective:    Patient ID: Karen Mess., female    DOB: 1970-06-07, 47 y.o.   MRN: [id number]  HPI: Karen Walker is a 47 y.o. female  Chief Complaint  Patient presents with  . Foot Pain    Left heel pain, feels like her right did when they found Melanoma  . Dysuria  . Mouth Lesions    Pt has been rinsing mouth with peroxide   FOOT PAIN Duration: noticed a knot on her heel 3 days ago, feels like when she had melanoma in the past in the same spot and it feels like that. She is concerned about this.  Involved foot: right Mechanism of injury: unknown Location:  R heel Onset: sudden  Severity: moderate  Quality:  Aching and sore- worse with being on it Frequency: worse at the end of the day Radiation: no Aggravating factors: weight bearing, walking, running and stairs  Alleviating factors: nothing  Status: stable Treatments attempted: none  Relief with NSAIDs?:  No NSAIDs Taken Weakness with weight bearing or walking: no Morning stiffness: no Swelling: no Redness: no Bruising: no Paresthesias / decreased sensation: no  Fevers:no  URINARY SYMPTOMS Duration: chronic Dysuria: burning Urinary frequency: yes Urgency: yes Small volume voids: no Symptom severity: mild Urinary incontinence: no Foul odor: no Hematuria: no Abdominal pain: no Back pain: no Suprapubic pain/pressure: no Flank pain: no Fever:  no Vomiting: no Relief with cranberry juice: no Relief with pyridium: no Status: worse Previous urinary tract infection: yes Recurrent urinary tract infection: yes Sexual activity: monogomous History of sexually transmitted disease: no Vaginal discharge: no Treatments attempted: increasing fluids   Has been getting canker sores in her mouth.  Relevant past medical, surgical, family and social history reviewed and updated as indicated.  Interim medical history since our last visit reviewed. Allergies and medications reviewed and updated.  Review of Systems  Constitutional: Negative.   Respiratory: Negative.   Cardiovascular: Negative.   Musculoskeletal: Positive for myalgias. Negative for arthralgias, back pain, gait problem, joint swelling, neck pain and neck stiffness.  Skin: Negative.   Neurological: Negative.   Psychiatric/Behavioral: Negative.     Per HPI unless specifically indicated above     Objective:    BP 101/70   Pulse 84   Temp 97.7 F (36.5 C) (Oral)   Wt 148 lb 3.2 oz (67.2 kg)   SpO2 97%   BMI 26.25 kg/m   Wt Readings from Last 3 Encounters:  01/12/18 148 lb 3.2 oz (67.2 kg)  10/19/17 147 lb 12.8 oz (67 kg)  09/22/17 148 lb 9.6 oz (67.4 kg)    Physical Exam  Constitutional: She is oriented to person, place, and time. She appears well-developed and well-nourished. No distress.  HENT:  Head: Normocephalic and atraumatic.  Right Ear: Hearing and external ear normal.  Left Ear: Hearing and external ear normal.  Nose: Nose normal.  Mouth/Throat: Oropharynx is clear and moist. No oropharyngeal exudate.  Eyes: Pupils are equal, round, and reactive to light. Conjunctivae, EOM and lids are normal. Right eye exhibits no discharge. Left eye exhibits no discharge. No scleral icterus.  Cardiovascular: Normal rate, regular rhythm, normal heart sounds and intact distal pulses. Exam reveals no gallop and no friction rub.  No murmur heard. Pulmonary/Chest: Effort normal and breath  sounds normal. No stridor. No respiratory distress. She has no wheezes. She has no rales. She exhibits no tenderness.  Musculoskeletal: Normal range of motion.  Tenderness to palpation of R heel  Neurological: She is alert and oriented to person, place, and time.  Skin: Skin is warm, dry and intact. Capillary refill takes less than 2 seconds. No rash noted. She is not diaphoretic. No erythema. No pallor.  No sign of skin  lesion on R heel  Psychiatric: She has a normal mood and affect. Her speech is normal and behavior is normal. Judgment and thought content normal. Cognition and memory are normal.  Nursing note and vitals reviewed.   Results for orders placed or performed in visit on 01/12/18  Microscopic Examination  Result Value Ref Range   WBC, UA 0-5 0 - 5 /hpf   RBC, UA 0-2 0 - 2 /hpf   Epithelial Cells (non renal) 0-10 0 - 10 /hpf   Bacteria, UA None seen None seen/Few  UA/M w/rflx Culture, Routine  Result Value Ref Range   Specific Gravity, UA 1.015 1.005 - 1.030   pH, UA 8.5 (H) 5.0 - 7.5   Color, UA Yellow Yellow   Appearance Ur Hazy (A) Clear   Leukocytes, UA Trace (A) Negative   Protein, UA Negative Negative/Trace   Glucose, UA Negative Negative   Ketones, UA Negative Negative   RBC, UA Negative Negative   Bilirubin, UA Negative Negative   Urobilinogen, Ur 0.2 0.2 - 1.0 mg/dL   Nitrite, UA Negative Negative   Microscopic Examination See below:       Assessment & Plan:   Problem List Items Addressed This Visit    None    Visit Diagnoses    Pain of right heel    -  Primary   Concern for plantar fasciitis. Exercises given and will get her into podiatry. Call with any concerns.    Relevant Orders   Ambulatory referral to Podiatry   Painful urination       No sign of UTI. Has stopped using her hormone cream. Advised her to restart it. Call wiht any concerns.    Relevant Orders   UA/M w/rflx Culture, Routine (Completed)   Urine Culture   History of melanoma       Patient states that this feels like when she had melanoma in the past. Will get her back into dermatology. Appointment scheduled.    Relevant Orders   Ambulatory referral to Dermatology   Canker sores oral       Infomration provided. Call if not getting better or getting worse.        Follow up plan: Return in about 4 weeks (around 02/09/2018) for Physical.

## 2018-01-12 NOTE — Patient Instructions (Addendum)
[place]:  [address] [place] [address] [phone] February 22, 2018 @ 3:15  [place] Las aftas son llagas pequeas y dolorosas que aparecen en el interior de la boca. Se las conoce tambin como lceras aftosas. Las aftas pueden aparecer en la parte interna de los labios o las mejillas, la lengua o en cualquier lugar dentro de la boca. Puede tener una o varias aftas. Las aftas no pueden transmitirse de Ardelia Mems persona a Alcus Dad (no son contagiosas). Estas aftas son diferentes de las llagas que aparecen en la parte externa de los labios (llagas peribucales o herpes labial). Suelen comenzar como bultos rojos dolorosos y Air traffic controller se convierten en lceras de color blanco, amarillo o gris con bordes rojos. Estas lceras pueden ser bastante dolorosas. El dolor puede volverse ms intenso al ingerir comidas o bebidas. CAUSAS Se desconoce la causa de esta afeccin. FACTORES DE RIESGO Es ms probable que esta afeccin se manifieste en:  Mujeres.  Adolescentes o personas que tienen entre 46 y30 aos.  Mujeres que estn [place].  Personas que estn bajo mucho estrs emocional.  Personas que no reciben el aporte suficiente de hierro o vitaminaB.  Personas cuya higiene bucal es deficiente.  Personas con lesiones dentro de la boca. Esto puede ocurrir despus de Personnel officer o por Neurosurgeon duro. SNTOMAS Junto con las llagas, los sntomas tambin pueden incluir lo siguiente:  Cristy Hilts.  Fatiga.  Inflamacin de los ganglios linfticos del cuello. DIAGNSTICO Esta afeccin se puede diagnosticar en funcin de los sntomas. El mdico tambin le examinar la boca. Adems, puede hacerle estudios si tiene aftas con frecuencia o si tienen mala apariencia. Entre ellos:  Anlisis de sangre para descartar otras causas de las aftas.  Tomar muestras de las aftas con un hisopo para Product manager presencia de infecciones.  Extraer un pequeo trozo de piel del  afta (biopsia) para determinar si hay cncer. [place] aftas desaparecen sin tratamiento en aproximadamente 10das. Generalmente, el cuidado en el hogar es el nico tratamiento necesario. Los medicamentos de venta libre pueden [employer].Si las llagas estn muy extendidas, el mdico puede recetarle lo siguiente:  Ungento anestsico para Best boy.  Vitaminas.  Medicamentos con corticoides. Estos pueden administrarse de la siguiente forma: ? Comprimidos por va oral. ? Enjuagues bucales. ? Geles.  Enjuague bucal con antibitico. INSTRUCCIONES PARA EL CUIDADO EN EL HOGAR  Aplquese, tome o use los medicamentos solamente como se lo haya indicado el mdico. Estos [place].  Si le recetaron un enjuague bucal con antibitico, asegrese de terminarlo aunque comience a sentirse mejor.  Hasta que las aftas hayan cicatrizado: ? No beba caf ni jugos ctricos. ? No consuma alimentos picantes ni salados.  Use un enjuague bucal suave de venta [employer] se lo haya indicado el mdico.  Mantenga una buena higiene bucal. ? Utilice hilo dental [employer]. ? Cepllese los dientes con un cepillo de cerdas [employer] por da. SOLICITE ATENCIN MDICA SI:  Los sntomas no mejoran despus de [employer].  Tambin tiene fiebre o los [employer].  Tiene aftas con frecuencia.  Tiene un afta que se le est agrandando.  No puede ingerir alimentos ni bebidas debido a las aftas. Esta informacin no tiene Marine scientist el consejo del mdico. Asegrese de hacerle al mdico cualquier pregunta que tenga. Document Released: 12/17/2011 Document Revised: 08/08/2014 Document Reviewed: 02/22/2014 Elsevier Interactive Patient Education  2018 [employer].  Fascitis plantar (  Plantar Fasciitis) La fascitis plantar es una afeccin dolorosa que se produce en el taln. Ocurre cuando la banda de tejido que [employer] Corporation dedos con el  hueso del taln (fascia plantar) se irrita. Esto puede ocurrir despus de Financial planner ejercicio u otras actividades repetitivas (lesin por uso excesivo). El dolor de la fascitis plantar puede ser de leve (irritacin) a intenso, y en los casos ms agudos puede dificultar que la persona camine o se [place]. Por lo general, el dolor es peor a la maana o despus de Public affairs consultant sentado o acostado durante un perodo. CAUSAS Este trastorno puede ser causado por:  Estar de pie durante largos perodos.  Usar zapatos que no calcen bien.  Practicar actividades de alto impacto, como correr, o hacer ejercicios [employer] o ballet.  Tener sobrepeso.  Tener una forma de caminar (marcha) anormal.  Tener los msculos de la pantorrilla tensos.  Tener el arco alto en los pies.  Comenzar una nueva actividad fsica. SNTOMAS El sntoma principal de esta afeccin es el dolor en el taln. Otros sntomas pueden ser los siguientes:  Dolor que empeora despus de una actividad o un ejercicio.  Dolor ms intenso a la maana o despus de Production assistant, radio.  Dolor que desaparece despus de caminar durante unos minutos. DIAGNSTICO Esta afeccin se puede diagnosticar en funcin de los signos y los sntomas. El mdico Environmental education officer un examen fsico para controlar si tiene lo siguiente:  Un rea dolorida en la parte inferior del pie.  El arco alto.  Dolor al Agricultural consultant.  Dificultad para mover el pie. Tambin puede necesitar estudios por imgenes para confirmar el diagnstico. Estos pueden incluir los siguientes:  Radiografas.  Ecografa.  Resonancia magntica. TRATAMIENTO El tratamiento de la fascitis plantar depende de la gravedad de la afeccin. El tratamiento puede incluir lo siguiente:  Reposo, hielo y analgsicos de venta libre para Financial controller.  Ejercicios para [place] pantorrillas y la fascia plantar.  Una frula que [employer] estirado y [place] mientras usted duerme  (frula nocturna).  Fisioterapia para [employer] sntomas y Customer service manager en el futuro.  Inyecciones de cortisona para Best boy intenso.  Tratamiento con ondas de choque extracorpreas para estimular con impulsos elctricos la fascia plantar lesionada. Esto suele usarse como un ltimo recurso antes de la [place].  [place], si los otros tratamientos no han funcionado despus de 83meses. Saucier los medicamentos solamente como se lo haya indicado el mdico.  Evite las actividades que causan dolor.  Frote la parte inferior del pie sobre una bolsa de hielo o una botella de agua fra. Haga esto durante 35minutos, de 3a 4veces al [employer].  Realice estiramientos simples como se lo haya indicado el mdico.  Trate de usar calzado deportivo con amortiguacin de aire o gel, o plantillas blandas.  Si el mdico se lo ha indicado, use una frula nocturna para dormir.  Cumpla con todas las visitas de control, segn le indique su mdico. PREVENCIN  No realice ejercicios ni actividades que le causen dolor en el taln.  Considere la posibilidad de Art gallery manager actividades de bajo impacto si sigue teniendo problemas.  Pierda peso si lo necesita. La mejor forma de prevenir la fascitis plantar es evitar las actividades que lesionan ms la fascia plantar. SOLICITE ATENCIN MDICA SI:  Los sntomas no desaparecen despus del tratamiento en su casa.  El dolor [place].  El dolor afecta la capacidad de [place] o de  realizar las actividades diarias. Esta informacin no tiene Marine scientist el consejo del mdico. Asegrese de hacerle al mdico cualquier pregunta que tenga. Document Released: 01/01/2005 Document Revised: 07/16/2015 Document Reviewed: 02/01/2014 Elsevier Interactive Patient Education  2018 Elsevier Inc.  Fascitis plantar con rehabilitacin Plantar Fasciitis Rehab Consulte al mdico qu ejercicios son seguros para usted. Haga los  ejercicios exactamente como se lo haya indicado el mdico y gradelos como se lo hayan indicado. Es normal sentir tirantez, tensin, presin o molestias leves mientras hace estos ejercicios, pero debe detenerse de inmediato si siente dolor repentino o si el dolor empeora. No comience a hacer estos ejercicios hasta que se lo indique el mdico. EJERCICIOS DE Fiserv Y AMPLITUD DE MOVIMIENTOS Estos ejercicios calientan los msculos y las articulaciones, y mejoran la movilidad y la flexibilidad del pie. Adems, ayudan a Best boy. EjercicioA: Estiramiento de la fascia plantar 1. Sintese con la pierna izquierda/derecha cruzada sobre la rodilla opuesta. 2. Sostenga el taln con [place] Foot[place], con el pulgar cerca del arco. Con la otra mano, sostenga los dedos de los pies y empjelos con [place]. Debe sentir un estiramiento en la parte de abajo de los dedos o del pie, o de [place]. 3. Mantenga esta posicin durante _________ segundos. 4. Afloje lentamente los dedos y vuelva a la posicin inicial. Repita __________ veces. Realice este ejercicio __________ veces al da. EjercicioB: Msculos gemelos, de pie 1. Prese con las Yahoo[employer] Inc pared. 2. Extienda la pierna izquierda/derecha hacia atrs y flexione ligeramente la rodilla de la pierna de adelante. 3. Mantenga los talones en el suelo y la rodilla de atrs extendida, y pase el peso hacia la pared, sin arquear la espalda. Debe sentir un estiramiento suave en la pantorrilla izquierda/derecha. 4. Mantenga esta posicin durante ___________ segundos. Repita __________ veces. Realice este ejercicio __________ veces al da. EjercicioC: Msculo sleo, de pie 1. Prese con las [employer] pared. 2. Extienda la pierna izquierda/derecha hacia atrs y flexione ligeramente la rodilla de la pierna de adelante. 3. Mantenga los talones apoyados en el suelo, flexione la rodilla de atrs y lleve el peso ligeramente a la pierna de  atrs. Debe sentir un estiramiento suave en la parte profunda de la pantorrilla. 4. Mantenga esta posicin durante ___________ segundos. Repita __________ veces. Realice este ejercicio __________ veces al da. EjercicioD: Msculos gemelos y sleo, de pie 1. Prese sobre un escaln apoyando solo la regin metatarsiana de su pie derecho/izquierdo. La parte metatarsiana de la planta del pie es la superficie sobre la que caminamos, justo debajo de los dedos. 2. Mantenga el otro pie apoyado con firmeza en el mismo escaln. 3. Sostngase de la pared o de una baranda para mantener el equilibrio. 4. Levante lentamente el Google, y permita que el peso del cuerpo presione el taln sobre el borde del escaln. Debe sentir un estiramiento en la pantorrilla izquierda/derecha. 5. Mantenga esta posicin durante ___________ segundos. 6. Vuelva a poner ambos pies sobre el escaln. 7. Repita este ejercicio con una leve flexin en la rodilla izquierda/derecha. Reptalo __________ veces con la rodilla izquierda/derecha extendida y __________ veces con la rodilla izquierda/derecha flexionada. Realice este ejercicio __________ veces al da. EJERCICIO DE EQUILIBRIO Este ejercicio aumenta el equilibrio y el control de la fuerza del arco, para ayudar a reducir la presin sobre la fascia plantar. EjercicioE: Pararse en una sola pierna 1. Sin calzado, prese cerca de una baranda o [place]. Puede sostenerse de la baranda o del  marco de la puerta, segn lo necesite. 2. Prese sobre el pie izquierdo/derecho. Sin despegar el dedo [place] del suelo, intente mantener el arco levantado. No deje que el pie se vaya hacia adentro. 3. Mantenga esta posicin durante ___________ segundos. 4. Si este ejercicio es muy fcil, puede intentar [employer] con los ojos cerrados o parado sobre [place]. Repita __________ veces. Realice este ejercicio __________ veces al da. Esta informacin no tiene Marine scientist el consejo del  mdico. Asegrese de hacerle al mdico cualquier pregunta que tenga. Document Released: 01/08/2006 Document Revised: 08/08/2014 Document Reviewed: 02/05/2015 Elsevier Interactive Patient Education  2018 [employer].

## 2018-01-13 ENCOUNTER — Encounter: Payer: Self-pay | Admitting: Family Medicine

## 2018-02-12 ENCOUNTER — Encounter: Payer: Self-pay | Admitting: Family Medicine

## 2018-02-12 ENCOUNTER — Ambulatory Visit: Payer: 59 | Admitting: Family Medicine

## 2018-02-12 VITALS — BP 91/59 | HR 81 | Temp 97.8°F | Ht 64.0 in | Wt 151.9 lb

## 2018-02-12 DIAGNOSIS — Z0000 Encounter for general adult medical examination without abnormal findings: Secondary | ICD-10-CM | POA: Diagnosis not present

## 2018-02-12 DIAGNOSIS — Z114 Encounter for screening for human immunodeficiency virus [HIV]: Secondary | ICD-10-CM | POA: Diagnosis not present

## 2018-02-12 DIAGNOSIS — F331 Major depressive disorder, recurrent, moderate: Secondary | ICD-10-CM

## 2018-02-12 DIAGNOSIS — H538 Other visual disturbances: Secondary | ICD-10-CM

## 2018-02-12 DIAGNOSIS — Z1239 Encounter for other screening for malignant neoplasm of breast: Secondary | ICD-10-CM

## 2018-02-12 LAB — UA/M W/RFLX CULTURE, ROUTINE
BILIRUBIN UA: NEGATIVE
Glucose, UA: NEGATIVE
Ketones, UA: NEGATIVE
LEUKOCYTES UA: NEGATIVE
NITRITE UA: NEGATIVE
PH UA: 7.5 (ref 5.0–7.5)
Protein, UA: NEGATIVE
RBC UA: NEGATIVE
SPEC GRAV UA: 1.015 (ref 1.005–1.030)
UUROB: 0.2 mg/dL (ref 0.2–1.0)

## 2018-02-12 LAB — BAYER DCA HB A1C WAIVED: HB A1C: 5.4 % (ref <7.0)

## 2018-02-12 MED ORDER — ESCITALOPRAM OXALATE 5 MG PO TABS: 5.0000 mg | ORAL_TABLET | Freq: Every day | ORAL | 3 refills | Status: DC

## 2018-02-12 NOTE — Progress Notes (Signed)
BP (!) 91/59 (BP Location: Left Arm, Patient Position: Sitting, Cuff Size: Normal)   Pulse 81   Temp 97.8 F (36.6 C) (Oral)   Ht 5\' 4"  (1.626 m)   Wt 151 lb 14.4 oz (68.9 kg)   SpO2 99%   BMI 26.07 kg/m    Subjective:    Patient ID: Karen Mess., female    DOB: 05-26-1970, 47 y.o.   MRN: [id number]  HPI: Karen Walker is a 47 y.o. female presenting on 02/12/2018 for comprehensive medical examination. Current medical complaints include:  DEPRESSION- did not start her lexapro Mood status: uncontrolled Satisfied with current treatment?: no Symptom severity: moderate  Duration of current treatment : not on anything Side effects: no Medication compliance: poor compliance Psychotherapy/counseling: no  Previous psychiatric medications: lexapro Depressed mood: yes Anxious mood: yes Anhedonia: no Significant weight loss or gain: no Insomnia: no  Fatigue: yes Feelings of worthlessness or guilt: yes Impaired concentration/indecisiveness: yes Suicidal ideations: no Hopelessness: no Crying spells: yes Depression screen [facility] 2/9 02/12/2018 01/12/2018 01/26/2017 12/26/2016  Decreased Interest 1 1 2 3   Down, Depressed, Hopeless 1 1 2 3   PHQ - 2 Score 2 2 4 6   Altered sleeping 3 3 2 1   Tired, decreased energy 3 2 2 2   Change in appetite 3 1 2 3   Feeling bad or failure about yourself  3 - 3 3  Trouble concentrating 3 0 2 1  Moving slowly or fidgety/restless 3 0 2 2  Suicidal thoughts 1 3 1 1   PHQ-9 Score 21 11 18 19   Difficult doing work/chores Very difficult - Very difficult Very difficult   She currently lives with: husband and kids Menopausal Symptoms: no  Depression Screen done today and results listed below:  Depression screen [facility] 2/9 02/12/2018 01/12/2018 01/26/2017 12/26/2016  Decreased Interest 1 1 2 3   Down, Depressed, Hopeless 1 1 2 3   PHQ - 2 Score 2 2 4 6   Altered sleeping 3 3 2 1   Tired, decreased energy 3 2 2 2   Change in appetite 3 1 2 3   Feeling bad or  failure about yourself  3 - 3 3  Trouble concentrating 3 0 2 1  Moving slowly or fidgety/restless 3 0 2 2  Suicidal thoughts 1 3 1 1   PHQ-9 Score 21 11 18 19   Difficult doing work/chores Very difficult - Very difficult Very difficult    Past Medical History:  Past Medical History:  Diagnosis Date  . Allergic rhinitis   . Anxiety   . Depression   . History of skin cancer   . Hx of migraine headaches    light sensivity, unilateral HA  . Urticaria     Surgical History:  Past Surgical History:  Procedure Laterality Date  . ABDOMINAL HYSTERECTOMY  2014  . BREAST CYST ASPIRATION Right 2013   negative. Done at Dr. Dwyane Luo office  . HERNIA REPAIR  2014  . hysterectemy    . melonoma removal on R medial arch of foot  2008  . TOTAL ABDOMINAL HYSTERECTOMY W/ BILATERAL SALPINGOOPHORECTOMY    . UMBILICAL HERNIA REPAIR     occurred with hysterectemy    Medications:  Current Outpatient Medications on File Prior to Visit  Medication Sig  . conjugated estrogens (PREMARIN) vaginal cream 1 applicator full daily for 2 weeks, then decrease to 1-3 times a week  . Cyanocobalamin (B-12 PO) Take by mouth daily.  . montelukast (SINGULAIR) 10 MG tablet Take 10 mg by mouth at  bedtime.  . Multiple Vitamin (MULTIVITAMIN) capsule Take 1 capsule by mouth daily.  . valACYclovir (VALTREX) 500 MG tablet Take 500 mg by mouth 2 (two) times daily.  . Calcium Carbonate (CALCIUM 600 PO) Take 600 mg by mouth daily.  . naproxen (NAPROSYN) 500 MG tablet Take 500 mg by mouth 2 (two) times daily with a meal.   No current facility-administered medications on file prior to visit.     Allergies:  Allergies  Allergen Reactions  . Codeine Other (See Comments)  . Pollen Extract     Social History:  Social History   Socioeconomic History  . Marital status: Married    Spouse name: Not on file  . Number of children: Not on file  . Years of education: Not on file  . Highest education level: Not on file    Occupational History  . Not on file  Social Needs  . Financial resource strain: Not on file  . Food insecurity:    Worry: Not on file    Inability: Not on file  . Transportation needs:    Medical: Not on file    Non-medical: Not on file  Tobacco Use  . Smoking status: Never Smoker  . Smokeless tobacco: Never Used  Substance and Sexual Activity  . Alcohol use: No  . Drug use: No  . Sexual activity: Yes    Birth control/protection: Surgical  Lifestyle  . Physical activity:    Days per week: Not on file    Minutes per session: Not on file  . Stress: Not on file  Relationships  . Social connections:    Talks on phone: Not on file    Gets together: Not on file    Attends religious service: Not on file    Active member of club or organization: Not on file    Attends meetings of clubs or organizations: Not on file    Relationship status: Not on file  . Intimate partner violence:    Fear of current or ex partner: Not on file    Emotionally abused: Not on file    Physically abused: Not on file    Forced sexual activity: Not on file  Other Topics Concern  . Not on file  Social History Narrative  . Not on file   Social History   Tobacco Use  Smoking Status Never Smoker  Smokeless Tobacco Never Used   Social History   Substance and Sexual Activity  Alcohol Use No    Family History:  Family History  Problem Relation Age of Onset  . Stroke Father   . Asthma Daughter   . Urticaria Son   . Food Allergy Son        red dye, dairy  . Allergic rhinitis Neg Hx   . Angioedema Neg Hx   . Eczema Neg Hx     Past medical history, surgical history, medications, allergies, family history and social history reviewed with patient today and changes made to appropriate areas of the chart.   Review of Systems  Constitutional: Negative.   HENT: Positive for sore throat. Negative for congestion, ear discharge, ear pain, hearing loss, nosebleeds, sinus pain and tinnitus.   Eyes:  Positive for blurred vision. Negative for double vision, photophobia, pain, discharge and redness.  Respiratory: Positive for shortness of breath (with panic attack). Negative for cough, hemoptysis, sputum production, wheezing and stridor.   Cardiovascular: Positive for palpitations. Negative for chest pain, orthopnea, claudication, leg swelling and PND.  Gastrointestinal:  Negative.   Genitourinary: Positive for dysuria. Negative for flank pain, frequency, hematuria and urgency.       Vaginal dryness  Musculoskeletal: Negative.   Skin: Negative.   Neurological: Negative.   Endo/Heme/Allergies: Positive for environmental allergies. Negative for polydipsia. Does not bruise/bleed easily.  Psychiatric/Behavioral: Positive for depression. Negative for hallucinations, memory loss, substance abuse and suicidal ideas. The patient is nervous/anxious. The patient does not have insomnia.     All other ROS negative except what is listed above and in the HPI.      Objective:    BP (!) 91/59 (BP Location: Left Arm, Patient Position: Sitting, Cuff Size: Normal)   Pulse 81   Temp 97.8 F (36.6 C) (Oral)   Ht 5\' 4"  (1.626 m)   Wt 151 lb 14.4 oz (68.9 kg)   SpO2 99%   BMI 26.07 kg/m   Wt Readings from Last 3 Encounters:  02/12/18 151 lb 14.4 oz (68.9 kg)  01/12/18 148 lb 3.2 oz (67.2 kg)  10/19/17 147 lb 12.8 oz (67 kg)    Physical Exam  Constitutional: She is oriented to person, place, and time. She appears well-developed and well-nourished. No distress.  HENT:  Head: Normocephalic and atraumatic.  Right Ear: Hearing and external ear normal.  Left Ear: Hearing and external ear normal.  Nose: Nose normal.  Mouth/Throat: Oropharynx is clear and moist. No oropharyngeal exudate.  Eyes: Pupils are equal, round, and reactive to light. Conjunctivae, EOM and lids are normal. Right eye exhibits no discharge. Left eye exhibits no discharge. No scleral icterus.  Neck: Normal range of motion. Neck  supple. No JVD present. No tracheal deviation present. No thyromegaly present.  Cardiovascular: Normal rate, regular rhythm, normal heart sounds and intact distal pulses. Exam reveals no gallop and no friction rub.  No murmur heard. Pulmonary/Chest: Effort normal and breath sounds normal. No stridor. No respiratory distress. She has no wheezes. She has no rales. She exhibits no tenderness. Right breast exhibits no inverted nipple, no mass, no nipple discharge, no skin change and no tenderness. Left breast exhibits no inverted nipple, no mass, no nipple discharge, no skin change and no tenderness. No breast swelling, tenderness, discharge or bleeding. Breasts are symmetrical.  Abdominal: Soft. Bowel sounds are normal. She exhibits no distension and no mass. There is no tenderness. There is no rebound and no guarding. No hernia.  Musculoskeletal: Normal range of motion. She exhibits no edema, tenderness or deformity.  Lymphadenopathy:    She has no cervical adenopathy.  Neurological: She is alert and oriented to person, place, and time. She displays normal reflexes. No cranial nerve deficit or sensory deficit. She exhibits normal muscle tone. Coordination normal.  Skin: Skin is warm, dry and intact. Capillary refill takes less than 2 seconds. No rash noted. She is not diaphoretic. No erythema. No pallor.  Psychiatric: She has a normal mood and affect. Her speech is normal and behavior is normal. Judgment and thought content normal. Cognition and memory are normal.  Nursing note and vitals reviewed.  Breast exam done today with Merilyn Baba, CMA in attendance.  Results for orders placed or performed in visit on 01/12/18  Microscopic Examination  Result Value Ref Range   WBC, UA 0-5 0 - 5 /hpf   RBC, UA 0-2 0 - 2 /hpf   Epithelial Cells (non renal) 0-10 0 - 10 /hpf   Bacteria, UA None seen None seen/Few  UA/M w/rflx Culture, Routine  Result Value Ref Range   Specific  Gravity, UA 1.015 1.005 -  1.030   pH, UA 8.5 (H) 5.0 - 7.5   Color, UA Yellow Yellow   Appearance Ur Hazy (A) Clear   Leukocytes, UA Trace (A) Negative   Protein, UA Negative Negative/Trace   Glucose, UA Negative Negative   Ketones, UA Negative Negative   RBC, UA Negative Negative   Bilirubin, UA Negative Negative   Urobilinogen, Ur 0.2 0.2 - 1.0 mg/dL   Nitrite, UA Negative Negative   Microscopic Examination See below:       Assessment & Plan:   Problem List Items Addressed This Visit      Other   Major depression, recurrent ([place])    Has not started her lexepro. Encouraged her to start it. Recheck 1 month. Call with any concerns.       Relevant Medications   escitalopram (LEXAPRO) 5 MG tablet    Other Visit Diagnoses    Routine general medical examination at a health care facility    -  Primary   Vaccines up to date. Screening labs checked today. Pap N/A. Mammogram ordered. Continue diet and exercise. Call with any concerns.    Relevant Orders   Bayer DCA Hb A1c Waived   CBC with Differential/Platelet   Comprehensive metabolic panel   Lipid Panel w/o Chol/HDL Ratio   TSH   UA/M w/rflx Culture, Routine   Encounter for screening for HIV       Labs drawn today. Await results.    Relevant Orders   HIV antibody (with reflex)   Blurred vision       Referral to opthalmology made today. Call with any concerns.    Relevant Orders   Ambulatory referral to Ophthalmology   Screening for breast cancer       Mammogram ordered today.   Relevant Orders   MM DIGITAL SCREENING BILATERAL       Follow up plan: Return in about 4 weeks (around 03/12/2018) for Follow up mood.   LABORATORY TESTING:  - Pap smear: not applicable  IMMUNIZATIONS:   - Tdap: Tetanus vaccination status reviewed: last tetanus booster within 10 years. - Influenza: Up to date  SCREENING: -Mammogram: Ordered today   PATIENT COUNSELING:   Advised to take 1 mg of folate supplement per day if capable of pregnancy.   Sexuality:  Discussed sexually transmitted diseases, partner selection, use of condoms, avoidance of unintended pregnancy  and contraceptive alternatives.   Advised to avoid cigarette smoking.  I discussed with the patient that most people either abstain from alcohol or drink within safe limits (<=14/week and <=4 drinks/occasion for males, <=7/weeks and <= 3 drinks/occasion for females) and that the risk for alcohol disorders and other health effects rises proportionally with the number of drinks per week and how often a drinker exceeds daily limits.  Discussed cessation/primary prevention of drug use and availability of treatment for abuse.   Diet: Encouraged to adjust caloric intake to maintain  or achieve ideal body weight, to reduce intake of dietary saturated fat and total fat, to limit sodium intake by avoiding high sodium foods and not adding table salt, and to maintain adequate dietary potassium and calcium preferably from fresh fruits, vegetables, and low-fat dairy products.    stressed the importance of regular exercise  Injury prevention: Discussed safety belts, safety helmets, smoke detector, smoking near bedding or upholstery.   Dental health: Discussed importance of regular tooth brushing, flossing, and dental visits.    NEXT PREVENTATIVE PHYSICAL DUE IN 1 YEAR.  Return in about 4 weeks (around 03/12/2018) for Follow up mood.

## 2018-02-12 NOTE — Patient Instructions (Addendum)
[facility] at [facility]  Address: [address] [place], [place], [place] [address]  Phone: [phone]  Health Maintenance, Female Adopting a healthy lifestyle and getting preventive care can go a long way to promote health and wellness. Talk with your health care provider about what schedule of regular examinations is right for you. This is a good chance for you to check in with your provider about disease prevention and staying healthy. In between checkups, there are plenty of things you can do on your own. Experts have done a lot of research about which lifestyle changes and preventive measures are most likely to keep you healthy. Ask your health care provider for more information. Weight and diet Eat a healthy diet  Be sure to include plenty of vegetables, fruits, low-fat dairy products, and lean protein.  Do not eat a lot of foods high in solid fats, added sugars, or salt.  Get regular exercise. This is one of the most important things you can do for your health. ? Most adults should exercise for at least 150 minutes each week. The exercise should increase your heart rate and make you sweat (moderate-intensity exercise). ? Most adults should also do strengthening exercises at least twice a week. This is in addition to the moderate-intensity exercise.  Maintain a healthy weight  Body mass index (BMI) is a measurement that can be used to identify possible weight problems. It estimates body fat based on height and weight. Your health care provider can help determine your BMI and help you achieve or maintain a healthy weight.  For females 75 years of age and older: ? A BMI below 18.5 is considered underweight. ? A BMI of 18.5 to 24.9 is normal. ? A BMI of 25 to 29.9 is considered overweight. ? A BMI of 30 and above is considered obese.  Watch levels of cholesterol and blood lipids  You should start having your blood tested for lipids and cholesterol at 48 years of  age, then have this test every 5 years.  You may need to have your cholesterol levels checked more often if: ? Your lipid or cholesterol levels are high. ? You are older than 48 years of age. ? You are at high risk for heart disease.  Cancer screening Lung Cancer  Lung cancer screening is recommended for adults 52-5 years old who are at high risk for lung cancer because of a history of smoking.  A yearly low-dose CT scan of the lungs is recommended for people who: ? Currently smoke. ? Have quit within the past 15 years. ? Have at least a 30-pack-year history of smoking. A pack year is smoking an average of one pack of cigarettes a day for 1 year.  Yearly screening should continue until it has been 15 years since you quit.  Yearly screening should stop if you develop a health problem that would prevent you from having lung cancer treatment.  Breast Cancer  Practice breast self-awareness. This means understanding how your breasts normally appear and feel.  It also means doing regular breast self-exams. Let your health care provider know about any changes, no matter how small.  If you are in your 20s or 30s, you should have a clinical breast exam (CBE) by a health care provider every 1-3 years as part of a regular health exam.  If you are 9 or older, have a CBE every year. Also consider having a breast X-ray (mammogram) every year.  If you have a  family history of breast cancer, talk to your health care provider about genetic screening.  If you are at high risk for breast cancer, talk to your health care provider about having an MRI and a mammogram every year.  Breast cancer gene (BRCA) assessment is recommended for women who have family members with BRCA-related cancers. BRCA-related cancers include: ? Breast. ? Ovarian. ? Tubal. ? Peritoneal cancers.  Results of the assessment will determine the need for genetic counseling and BRCA1 and BRCA2 testing.  Cervical  Cancer Your health care provider may recommend that you be screened regularly for cancer of the pelvic organs (ovaries, uterus, and vagina). This screening involves a pelvic examination, including checking for microscopic changes to the surface of your cervix (Pap test). You may be encouraged to have this screening done every 3 years, beginning at age 21.  For women ages 30-65, health care providers may recommend pelvic exams and Pap testing every 3 years, or they may recommend the Pap and pelvic exam, combined with testing for human papilloma virus (HPV), every 5 years. Some types of HPV increase your risk of cervical cancer. Testing for HPV may also be done on women of any age with unclear Pap test results.  Other health care providers may not recommend any screening for nonpregnant women who are considered low risk for pelvic cancer and who do not have symptoms. Ask your health care provider if a screening pelvic exam is right for you.  If you have had past treatment for cervical cancer or a condition that could lead to cancer, you need Pap tests and screening for cancer for at least 20 years after your treatment. If Pap tests have been discontinued, your risk factors (such as having a new sexual partner) need to be reassessed to determine if screening should resume. Some women have medical problems that increase the chance of getting cervical cancer. In these cases, your health care provider may recommend more frequent screening and Pap tests.  Colorectal Cancer  This type of cancer can be detected and often prevented.  Routine colorectal cancer screening usually begins at 48 years of age and continues through 48 years of age.  Your health care provider may recommend screening at an earlier age if you have risk factors for colon cancer.  Your health care provider may also recommend using home test kits to check for hidden blood in the stool.  A small camera at the end of a tube can be used to  examine your colon directly (sigmoidoscopy or colonoscopy). This is done to check for the earliest forms of colorectal cancer.  Routine screening usually begins at age 50.  Direct examination of the colon should be repeated every 5-10 years through 48 years of age. However, you may need to be screened more often if early forms of precancerous polyps or small growths are found.  Skin Cancer  Check your skin from head to toe regularly.  Tell your health care provider about any new moles or changes in moles, especially if there is a change in a mole's shape or color.  Also tell your health care provider if you have a mole that is larger than the size of a pencil eraser.  Always use sunscreen. Apply sunscreen liberally and repeatedly throughout the day.  Protect yourself by wearing long sleeves, pants, a wide-brimmed hat, and sunglasses whenever you are outside.  Heart disease, diabetes, and high blood pressure  High blood pressure causes heart disease and increases the risk of   stroke. High blood pressure is more likely to develop in: ? People who have blood pressure in the high end of the normal range (130-139/85-89 mm Hg). ? People who are overweight or obese. ? People who are African American.  If you are 18-39 years of age, have your blood pressure checked every 3-5 years. If you are 40 years of age or older, have your blood pressure checked every year. You should have your blood pressure measured twice-once when you are at a hospital or clinic, and once when you are not at a hospital or clinic. Record the average of the two measurements. To check your blood pressure when you are not at a hospital or clinic, you can use: ? An automated blood pressure machine at a pharmacy. ? A home blood pressure monitor.  If you are between 55 years and 79 years old, ask your health care provider if you should take aspirin to prevent strokes.  Have regular diabetes screenings. This involves taking a  blood sample to check your fasting blood sugar level. ? If you are at a normal weight and have a low risk for diabetes, have this test once every three years after 48 years of age. ? If you are overweight and have a high risk for diabetes, consider being tested at a younger age or more often. Preventing infection Hepatitis B  If you have a higher risk for hepatitis B, you should be screened for this virus. You are considered at high risk for hepatitis B if: ? You were born in a country where hepatitis B is common. Ask your health care provider which countries are considered high risk. ? Your parents were born in a high-risk country, and you have not been immunized against hepatitis B (hepatitis B vaccine). ? You have HIV or AIDS. ? You use needles to inject street drugs. ? You live with someone who has hepatitis B. ? You have had sex with someone who has hepatitis B. ? You get hemodialysis treatment. ? You take certain medicines for conditions, including cancer, organ transplantation, and autoimmune conditions.  Hepatitis C  Blood testing is recommended for: ? Everyone born from 1945 through 1965. ? Anyone with known risk factors for hepatitis C.  Sexually transmitted infections (STIs)  You should be screened for sexually transmitted infections (STIs) including gonorrhea and chlamydia if: ? You are sexually active and are younger than 48 years of age. ? You are older than 48 years of age and your health care provider tells you that you are at risk for this type of infection. ? Your sexual activity has changed since you were last screened and you are at an increased risk for chlamydia or gonorrhea. Ask your health care provider if you are at risk.  If you do not have HIV, but are at risk, it may be recommended that you take a prescription medicine daily to prevent HIV infection. This is called pre-exposure prophylaxis (PrEP). You are considered at risk if: ? You are sexually active and  do not regularly use condoms or know the HIV status of your partner(s). ? You take drugs by injection. ? You are sexually active with a partner who has HIV.  Talk with your health care provider about whether you are at high risk of being infected with HIV. If you choose to begin PrEP, you should first be tested for HIV. You should then be tested every 3 months for as long as you are taking PrEP. Pregnancy  If   you are premenopausal and you may become pregnant, ask your health care provider about preconception counseling.  If you may become pregnant, take 400 to 800 micrograms (mcg) of folic acid every day.  If you want to prevent pregnancy, talk to your health care provider about birth control (contraception). Osteoporosis and menopause  Osteoporosis is a disease in which the bones lose minerals and strength with aging. This can result in serious bone fractures. Your risk for osteoporosis can be identified using a bone density scan.  If you are 23 years of age or older, or if you are at risk for osteoporosis and fractures, ask your health care provider if you should be screened.  Ask your health care provider whether you should take a calcium or vitamin D supplement to lower your risk for osteoporosis.  Menopause may have certain physical symptoms and risks.  Hormone replacement therapy may reduce some of these symptoms and risks. Talk to your health care provider about whether hormone replacement therapy is right for you. Follow these instructions at home:  Schedule regular health, dental, and eye exams.  Stay current with your immunizations.  Do not use any tobacco products including cigarettes, chewing tobacco, or electronic cigarettes.  If you are pregnant, do not drink alcohol.  If you are breastfeeding, limit how much and how often you drink alcohol.  Limit alcohol intake to no more than 1 drink per day for nonpregnant women. One drink equals 12 ounces of beer, 5 ounces of  wine, or 1 ounces of hard liquor.  Do not use street drugs.  Do not share needles.  Ask your health care provider for help if you need support or information about quitting drugs.  Tell your health care provider if you often feel depressed.  Tell your health care provider if you have ever been abused or do not feel safe at home. This information is not intended to replace advice given to you by your health care provider. Make sure you discuss any questions you have with your health care provider. Document Released: 10/07/2010 Document Revised: 08/30/2015 Document Reviewed: 12/26/2014 Elsevier Interactive Patient Education  [employer].

## 2018-02-12 NOTE — Assessment & Plan Note (Signed)
Has not started her lexepro. Encouraged her to start it. Recheck 1 month. Call with any concerns.

## 2018-02-13 ENCOUNTER — Encounter: Payer: Self-pay | Admitting: Family Medicine

## 2018-02-13 LAB — CBC WITH DIFFERENTIAL/PLATELET
BASOS: 0 %
Basophils Absolute: 0 10*3/uL (ref 0.0–0.2)
EOS (ABSOLUTE): 0.2 10*3/uL (ref 0.0–0.4)
Eos: 5 %
HEMATOCRIT: 38 % (ref 34.0–46.6)
Hemoglobin: 12.6 g/dL (ref 11.1–15.9)
IMMATURE GRANS (ABS): 0 10*3/uL (ref 0.0–0.1)
IMMATURE GRANULOCYTES: 0 %
LYMPHS ABS: 1.5 10*3/uL (ref 0.7–3.1)
Lymphs: 37 %
MCH: 30 pg (ref 26.6–33.0)
MCHC: 33.2 g/dL (ref 31.5–35.7)
MCV: 91 fL (ref 79–97)
MONOS ABS: 0.2 10*3/uL (ref 0.1–0.9)
Monocytes: 6 %
NEUTROS PCT: 52 %
Neutrophils Absolute: 2.1 10*3/uL (ref 1.4–7.0)
Platelets: 195 10*3/uL (ref 150–450)
RBC: 4.2 x10E6/uL (ref 3.77–5.28)
RDW: 12.3 % (ref 12.3–15.4)
WBC: 4 10*3/uL (ref 3.4–10.8)

## 2018-02-13 LAB — COMPREHENSIVE METABOLIC PANEL
A/G RATIO: 1.5 (ref 1.2–2.2)
ALT: 23 IU/L (ref 0–32)
AST: 18 IU/L (ref 0–40)
Albumin: 4.1 g/dL (ref 3.5–5.5)
Alkaline Phosphatase: 53 IU/L (ref 39–117)
BILIRUBIN TOTAL: 0.5 mg/dL (ref 0.0–1.2)
BUN/Creatinine Ratio: 22 (ref 9–23)
BUN: 15 mg/dL (ref 6–24)
CALCIUM: 9.2 mg/dL (ref 8.7–10.2)
CHLORIDE: 103 mmol/L (ref 96–106)
CO2: 26 mmol/L (ref 20–29)
Creatinine, Ser: 0.69 mg/dL (ref 0.57–1.00)
GFR, EST AFRICAN AMERICAN: 119 mL/min/{1.73_m2} (ref >59)
GFR, EST NON AFRICAN AMERICAN: 103 mL/min/{1.73_m2} (ref >59)
GLOBULIN, TOTAL: 2.8 g/dL (ref 1.5–4.5)
Glucose: 62 mg/dL — ABNORMAL LOW (ref 65–99)
POTASSIUM: 3.8 mmol/L (ref 3.5–5.2)
SODIUM: 143 mmol/L (ref 134–144)
TOTAL PROTEIN: 6.9 g/dL (ref 6.0–8.5)

## 2018-02-13 LAB — TSH: TSH: 2.02 u[IU]/mL (ref 0.450–4.500)

## 2018-02-13 LAB — LIPID PANEL W/O CHOL/HDL RATIO
Cholesterol, Total: 176 mg/dL (ref 100–199)
HDL: 57 mg/dL (ref >39)
LDL Calculated: 92 mg/dL (ref 0–99)
Triglycerides: 135 mg/dL (ref 0–149)
VLDL Cholesterol Cal: 27 mg/dL (ref 5–40)

## 2018-02-13 LAB — HIV ANTIBODY (ROUTINE TESTING W REFLEX): HIV Screen 4th Generation wRfx: NONREACTIVE

## 2018-02-18 ENCOUNTER — Telehealth: Payer: Self-pay | Admitting: Family Medicine

## 2018-02-18 ENCOUNTER — Other Ambulatory Visit: Payer: Self-pay | Admitting: Family Medicine

## 2018-02-18 MED ORDER — VALACYCLOVIR HCL 500 MG PO TABS: 500.0000 mg | ORAL_TABLET | Freq: Two times a day (BID) | ORAL | 3 refills | Status: DC

## 2018-02-18 NOTE — Addendum Note (Signed)
Addended by: Valerie Roys on: 02/18/2018 04:01 PM   Modules accepted: Orders

## 2018-02-18 NOTE — Telephone Encounter (Signed)
Will route request to office; medication d/c'd 06/24/17 per medication list; the pt is normally seen by Dr Park Liter, [facility]; will route to office for final disposition.

## 2018-02-18 NOTE — Telephone Encounter (Signed)
Copied from [place] [id number]. Topic: Quick Communication - See Telephone Encounter >> Feb 18, 2018  2:10 PM Ivar Drape wrote: CRM for notification. See Telephone encounter for: 02/18/18. Patient would like a refill on her acyclovir (ZOVIRAX) 800 MG tablet medication and have it sent to her preferred pharmacy [facility].  However the medication is not on her current med list.

## 2018-02-18 NOTE — Telephone Encounter (Signed)
She's on valtrex, refills given.

## 2018-02-19 DIAGNOSIS — H1013 Acute atopic conjunctivitis, bilateral: Secondary | ICD-10-CM | POA: Diagnosis not present

## 2018-04-09 ENCOUNTER — Encounter: Payer: Self-pay | Admitting: Unknown Physician Specialty

## 2018-04-09 ENCOUNTER — Ambulatory Visit (INDEPENDENT_AMBULATORY_CARE_PROVIDER_SITE_OTHER): Payer: 59 | Admitting: Unknown Physician Specialty

## 2018-04-09 VITALS — BP 106/70 | HR 83 | Temp 98.3°F | Wt 150.0 lb

## 2018-04-09 DIAGNOSIS — I889 Nonspecific lymphadenitis, unspecified: Secondary | ICD-10-CM

## 2018-04-09 DIAGNOSIS — R05 Cough: Secondary | ICD-10-CM | POA: Diagnosis not present

## 2018-04-09 DIAGNOSIS — J069 Acute upper respiratory infection, unspecified: Secondary | ICD-10-CM

## 2018-04-09 DIAGNOSIS — J0100 Acute maxillary sinusitis, unspecified: Secondary | ICD-10-CM | POA: Diagnosis not present

## 2018-04-09 DIAGNOSIS — R059 Cough, unspecified: Secondary | ICD-10-CM

## 2018-04-09 MED ORDER — BENZONATATE 100 MG PO CAPS: 100.0000 mg | ORAL_CAPSULE | Freq: Two times a day (BID) | ORAL | 0 refills | Status: DC | PRN

## 2018-04-09 MED ORDER — AZITHROMYCIN 250 MG PO TABS: ORAL_TABLET | ORAL | 0 refills | Status: DC

## 2018-04-09 NOTE — Progress Notes (Signed)
BP 106/70   Pulse 83   Temp 98.3 F (36.8 C) (Oral)   Wt 150 lb (68 kg)   SpO2 98%   BMI 25.75 kg/m    Subjective:    Patient ID: Karen Mess., female    DOB: 10/20/1970, 48 y.o.   MRN: [id number]  HPI: Karen Walker is a 48 y.o. female  Chief Complaint  Patient presents with  . URI    pt states she has had a cough, congestion, sinus pressure, for over a week    Pt is here with complaints of l1 week of losing voice, sore throat and then cough.  No fever.  Cough is productive of clear mucous tinged with yellow.  Has a headache and chest congestion.    Relevant past medical, surgical, family and social history reviewed and updated as indicated. Interim medical history since our last visit reviewed. Allergies and medications reviewed and updated.  Review of Systems  Constitutional: Positive for fatigue.  HENT: Positive for congestion and sinus pressure. Negative for sinus pain and sneezing.   Eyes: Negative.   Genitourinary: Negative.   Psychiatric/Behavioral: Negative.     Per HPI unless specifically indicated above     Objective:    BP 106/70   Pulse 83   Temp 98.3 F (36.8 C) (Oral)   Wt 150 lb (68 kg)   SpO2 98%   BMI 25.75 kg/m   Wt Readings from Last 3 Encounters:  04/09/18 150 lb (68 kg)  02/12/18 151 lb 14.4 oz (68.9 kg)  01/12/18 148 lb 3.2 oz (67.2 kg)    Physical Exam Constitutional:      General: She is not in acute distress.    Appearance: Normal appearance. She is well-developed.  HENT:     Head: Normocephalic and atraumatic.     Right Ear: Tympanic membrane and ear canal normal.     Left Ear: Tympanic membrane and ear canal normal.     Nose: Rhinorrhea present.     Right Sinus: No maxillary sinus tenderness or frontal sinus tenderness.     Left Sinus: No maxillary sinus tenderness or frontal sinus tenderness.     Mouth/Throat:     Pharynx: Posterior oropharyngeal erythema present.  Eyes:     General: Lids are normal. No scleral  icterus.       Right eye: No discharge.        Left eye: No discharge.     Conjunctiva/sclera: Conjunctivae normal.  Neck:     Musculoskeletal: Normal range of motion and neck supple.     Comments: Tender cervical lymph nodes mid cervical chain Cardiovascular:     Rate and Rhythm: Normal rate and regular rhythm.  Pulmonary:     Effort: Pulmonary effort is normal. No respiratory distress.     Breath sounds: Normal breath sounds.  Abdominal:     Palpations: There is no hepatomegaly or splenomegaly.  Musculoskeletal: Normal range of motion.  Lymphadenopathy:     Cervical: Cervical adenopathy present.  Skin:    Coloration: Skin is not pale.     Findings: No rash.  Neurological:     Mental Status: She is alert and oriented to person, place, and time.  Psychiatric:        Behavior: Behavior normal.        Thought Content: Thought content normal.        Judgment: Judgment normal.     Results for orders placed or performed in visit on 02/12/18  HIV antibody (with reflex)  Result Value Ref Range   HIV Screen 4th Generation wRfx Non Reactive Non Reactive  Bayer DCA Hb A1c Waived  Result Value Ref Range   HB A1C (BAYER DCA - WAIVED) 5.4 <7.0 %  CBC with Differential/Platelet  Result Value Ref Range   WBC 4.0 3.4 - 10.8 x10E3/uL   RBC 4.20 3.77 - 5.28 x10E6/uL   Hemoglobin 12.6 11.1 - 15.9 g/dL   Hematocrit 38.0 34.0 - 46.6 %   MCV 91 79 - 97 fL   MCH 30.0 26.6 - 33.0 pg   MCHC 33.2 31.5 - 35.7 g/dL   RDW 12.3 12.3 - 15.4 %   Platelets 195 150 - 450 x10E3/uL   Neutrophils 52 Not Estab. %   Lymphs 37 Not Estab. %   Monocytes 6 Not Estab. %   Eos 5 Not Estab. %   Basos 0 Not Estab. %   Neutrophils Absolute 2.1 1.4 - 7.0 x10E3/uL   Lymphocytes Absolute 1.5 0.7 - 3.1 x10E3/uL   Monocytes Absolute 0.2 0.1 - 0.9 x10E3/uL   EOS (ABSOLUTE) 0.2 0.0 - 0.4 x10E3/uL   Basophils Absolute 0.0 0.0 - 0.2 x10E3/uL   Immature Granulocytes 0 Not Estab. %   Immature Grans (Abs) 0.0 0.0 -  0.1 x10E3/uL  Comprehensive metabolic panel  Result Value Ref Range   Glucose 62 (L) 65 - 99 mg/dL   BUN 15 6 - 24 mg/dL   Creatinine, Ser 0.69 0.57 - 1.00 mg/dL   GFR calc non Af Amer 103 >59 mL/min/1.73   GFR calc Af Amer 119 >59 mL/min/1.73   BUN/Creatinine Ratio 22 9 - 23   Sodium 143 134 - 144 mmol/L   Potassium 3.8 3.5 - 5.2 mmol/L   Chloride 103 96 - 106 mmol/L   CO2 26 20 - 29 mmol/L   Calcium 9.2 8.7 - 10.2 mg/dL   Total Protein 6.9 6.0 - 8.5 g/dL   Albumin 4.1 3.5 - 5.5 g/dL   Globulin, Total 2.8 1.5 - 4.5 g/dL   Albumin/Globulin Ratio 1.5 1.2 - 2.2   Bilirubin Total 0.5 0.0 - 1.2 mg/dL   Alkaline Phosphatase 53 39 - 117 IU/L   AST 18 0 - 40 IU/L   ALT 23 0 - 32 IU/L  Lipid Panel w/o Chol/HDL Ratio  Result Value Ref Range   Cholesterol, Total 176 100 - 199 mg/dL   Triglycerides 135 0 - 149 mg/dL   HDL 57 >39 mg/dL   VLDL Cholesterol Cal 27 5 - 40 mg/dL   LDL Calculated 92 0 - 99 mg/dL  TSH  Result Value Ref Range   TSH 2.020 0.450 - 4.500 uIU/mL  UA/M w/rflx Culture, Routine  Result Value Ref Range   Specific Gravity, UA 1.015 1.005 - 1.030   pH, UA 7.5 5.0 - 7.5   Color, UA Yellow Yellow   Appearance Ur Clear Clear   Leukocytes, UA Negative Negative   Protein, UA Negative Negative/Trace   Glucose, UA Negative Negative   Ketones, UA Negative Negative   RBC, UA Negative Negative   Bilirubin, UA Negative Negative   Urobilinogen, Ur 0.2 0.2 - 1.0 mg/dL   Nitrite, UA Negative Negative      Assessment & Plan:   Problem List Items Addressed This Visit    None    Visit Diagnoses    Viral upper respiratory tract infection    -  Primary   Discussed supportive care with rest and fluids  Relevant Medications   azithromycin (ZITHROMAX Z-PAK) 250 MG tablet   Lymphadenitis       Left cervical chain.  Secondary to sinusitis   Acute non-recurrent maxillary sinusitis       Relevant Medications   azithromycin (ZITHROMAX Z-PAK) 250 MG tablet   benzonatate  (TESSALON) 100 MG capsule   Cough       rx for Tessalon [place].         Follow up plan: Return if symptoms worsen or fail to improve.

## 2018-08-09 ENCOUNTER — Ambulatory Visit: Payer: 59 | Admitting: Family Medicine

## 2018-08-10 ENCOUNTER — Encounter: Payer: Self-pay | Admitting: Family Medicine

## 2018-08-10 ENCOUNTER — Other Ambulatory Visit: Payer: Self-pay

## 2018-08-10 ENCOUNTER — Ambulatory Visit: Payer: 59 | Admitting: Family Medicine

## 2018-08-10 VITALS — BP 100/67 | HR 71 | Temp 97.6°F | Ht 64.0 in | Wt 152.0 lb

## 2018-08-10 DIAGNOSIS — N3000 Acute cystitis without hematuria: Secondary | ICD-10-CM | POA: Diagnosis not present

## 2018-08-10 DIAGNOSIS — M545 Low back pain, unspecified: Secondary | ICD-10-CM

## 2018-08-10 DIAGNOSIS — R300 Dysuria: Secondary | ICD-10-CM | POA: Diagnosis not present

## 2018-08-10 MED ORDER — NITROFURANTOIN MONOHYD MACRO 100 MG PO CAPS: 100.0000 mg | ORAL_CAPSULE | Freq: Two times a day (BID) | ORAL | 0 refills | Status: DC

## 2018-08-10 MED ORDER — NAPROXEN 500 MG PO TABS: 500.0000 mg | ORAL_TABLET | Freq: Two times a day (BID) | ORAL | 3 refills | Status: DC

## 2018-08-10 NOTE — Patient Instructions (Signed)
Ejercicios para la espalda Back Exercises Los siguientes ejercicios fortalecen los msculos que dan soporte a la espalda y, [place], ayudan a Theatre manager la flexibilidad de la zona lumbar. Hacer estos ejercicios puede ser de ayuda para evitar o Best boy de espalda. Si tiene dolor o [employer] espalda, intente hacer estos ejercicios 2 o 3 veces por da, o como se lo haya indicado el mdico. Electrical engineer, hgalos una vez por da, pero aumente la cantidad de veces que repite los pasos para cada ejercicio (haga ms repeticiones). Si no tiene dolor o [employer], haga estos ejercicios una vez por da o como se lo haya indicado el mdico. Ejercicios Rodilla al pecho Repita estos pasos 3 o 5 veces con cada pierna: 1. Acustese boca arriba sobre una cama dura o sobre el suelo con las piernas extendidas. 2. Lleve una rodilla al pecho. La otra pierna debe quedar extendida y en contacto con el suelo. 3. [place]. Para lograrlo tmese la rodilla o el muslo. 4. Traccione la rodilla hasta sentir una elongacin suave en la parte baja de la espalda. 5. Mantenga la elongacin durante 10 a 30 segundos. 6. Suelte y extienda la pierna lentamente. Inclinacin de la pelvis Repita estos pasos 5 o 10 veces: 1. Acustese boca arriba sobre una cama dura o sobre el suelo con las piernas extendidas. 2. Flexione las rodillas de modo que apunten al techo y los pies queden apoyados en el suelo. 3. Contraiga los msculos de la parte baja del abdomen para empujar la zona lumbar contra el suelo. Con este movimiento se inclinar la pelvis de modo que el cccix apunte hacia el techo, en lugar de apuntar a los pies o al suelo. 4. Contraiga suavemente y respire con normalidad mientras mantiene esta posicin durante 5 a 10 segundos. El perro y el gato Repita estos pasos hasta que la zona lumbar se vuelva ms flexible: 1. [place] manos y las rodillas sobre  una superficie firme. Las manos deben estar alineadas con los hombros y las rodillas con las caderas. Puede colocarse almohadillas debajo de las rodillas para estar cmodo. 2. Deje caer la cabeza y baje el cccix en direccin al suelo de modo que la zona lumbar se arquee como el lomo de un gato [place]. 3. Mantenga esta posicin durante 5 segundos. 4. Lentamente, levante la cabeza y eleve el cccix de modo que apunte en direccin al techo para que la espalda forme un arco hundido como el lomo de un perro contento. 5. Mantenga esta posicin durante 5 segundos.  Flexiones de brazos  Repita estos pasos 5 o 10 veces: 1. Acustese sobre el abdomen (boca abajo) en el piso. [place] manos cerca de la cabeza, separadas aproximadamente al ancho de los hombros. 3. Con la espalda lo ms relajada posible y las caderas apoyadas en el suelo, extienda lentamente los brazos para levantar la mitad superior del cuerpo y Community education officer los hombros. No use los msculos de la espalda para elevar la parte superior del torso. Puede cambiar las manos de lugar para estar ms cmodo. 4. Mantenga esta posicin durante 5 segundos mientras mantiene la espalda relajada. 5. Lentamente vuelva a la posicin horizontal.  Puentes  Repita estos pasos 10 veces: 1. Acustese boca arriba sobre una superficie firme. 2. Flexione las rodillas de modo que apunten al techo y los pies queden apoyados en el suelo. 3. Contraiga los glteos  y despegue las nalgas del suelo hasta que la cintura est casi a la misma altura que las rodillas. Debe sentir el trabajo muscular en las nalgas y la parte de atrs de los muslos. Si no siente el esfuerzo de [employer], aleje los pies 1 o 2 pulgadas (2,5 o 5 centmetros) de las nalgas. 4. Mantenga esta posicin durante 3 a 5 segundos. 5. Baje lentamente las caderas a la posicin inicial y relaje los glteos por completo. Si este ejercicio le resulta muy fcil, intente realizarlo con los  brazos cruzados [place]. Abdominales Repita estos pasos 5 o 10 veces: 1. Acustese boca arriba sobre una cama dura o sobre el suelo con las piernas extendidas. 2. Flexione las rodillas de modo que apunten al techo y los pies queden apoyados en el suelo. 3. Cruce los [employer]. 4. Baje levemente el mentn en direccin al pecho sin doblar el cuello. 5. Contraiga los msculos abdominales y con lentitud eleve el tronco (torso) lo suficiente como para Administrator los omplatos del suelo. No eleve el torso ms que eso, porque esto puede sobreexigir a la zona lumbar y no ayuda a Aeronautical engineer abdominales. 6. Regrese lentamente a la posicin inicial. Elevaciones de espalda Repita estos pasos 5 o 10 veces: 1. Acustese sobre el abdomen (boca abajo) con los brazos a los costados del cuerpo y apoye la frente en el suelo. 2. Contraiga los msculos de las piernas y las nalgas. 3. Lentamente despegue el pecho del suelo [employer] las caderas bien apoyadas en el suelo. Mantenga la nuca alineada con la curvatura de la espalda. Los ojos deben mirar al suelo. 4. Mantenga esta posicin durante 3 a 5 segundos. 5. Regrese lentamente a la posicin inicial. Comunquese con un mdico si:  El dolor o las molestias en la espalda se vuelven mucho ms intensos cuando hace un ejercicio.  El dolor o las molestias en la espalda no se Runner, broadcasting/film/video en el trmino de las 2 horas posteriores a Therapist, art. Si tiene alguno de [employer], deje de [employer] ejercicios de inmediato. No vuelva a hacer los ejercicios a menos que el mdico lo autorice. Solicite ayuda de inmediato si:  Siente un dolor sbito e intenso en la espalda. Si esto ocurre, deje de [employer] ejercicios de inmediato. No vuelva a hacer los ejercicios a menos que el mdico lo autorice. Esta informacin no tiene Marine scientist el consejo del mdico. Asegrese de hacerle al mdico cualquier pregunta que  tenga. Document Released: 03/24/2005 Document Revised: 12/11/2017 Document Reviewed: 05/18/2014 Elsevier Interactive Patient Education  2019 Start para fortalecer los msculos del Diplomatic Services operational officer Exercises  Los ejercicios para fortalecer los msculos del tronco ayudan a Actor los msculos que se encuentran entre las costillas y la cadera (msculos abdominales). Estos msculos ayudan a Engineer, technical sales cuerpo y Software engineer [place] vertebral. Es importante mantener el tronco fortalecido para Product/process development scientist lesiones y Social research officer, government. Algunas actividades, como yoga y pilates, pueden ayudar a Software engineer los msculos del tronco. Tambin puede fortalecer los msculos del tronco haciendo ejercicios en su casa. Es importante que hable con el mdico antes de comenzar una nueva rutina de actividad fsica. Cules son los beneficios de los ejercicios para fortalecer los msculos del tronco? Los ejercicios para fortalecer los msculos del tronco pueden hacer lo siguiente:  Therapist, occupational de espalda.  Ayudar a recobrar fuerza despus de una lesin en la espalda o en la columna  vertebral.  Ayudar a prevenir lesiones durante la actividad fsica, especialmente lesiones en la espalda y las rodillas. Cmo hacer los ejercicios para fortalecer los msculos del tronco Repita estos ejercicios de 10 a 15 veces o hasta que se canse. Haga los ejercicios exactamente como se lo haya indicado el mdico y gradelos como se lo hayan indicado. Es normal sentir un estiramiento leve, tironeo, opresin o Tree surgeon al [employer] [place]. Si siente dolor al [employer] ejercicios, detngase. Si el dolor contina o empeora al [employer] ejercicios para fortalecer los msculos del tronco, comunquese con el mdico. Le aconsejamos utilizar una colchoneta acolchada para los ejercicios que se realizan en el piso. Puente  1. Recustese boca arriba sobre una superficie firme con las rodillas flexionadas y los  pies completamente apoyados en el suelo. 2. [place] la cadera de modo que las rodillas, la cadera y los hombros formen una lnea recta. Mantenga los msculos abdominales contrados. 3. Mantenga esta posicin durante 3 a 5segundos. 4. Baje lentamente la cadera a la posicin inicial. 5. Relaje los msculos por completo entre repeticiones. Puente con una sola pierna 1. Recustese boca arriba sobre una superficie firme con las rodillas flexionadas y los pies completamente apoyados en el suelo. 2. [place] la cadera de modo que las rodillas, la cadera y los hombros formen una lnea recta. Mantenga los msculos abdominales contrados. 3. [place] un pie del suelo, luego enderece completamente esa pierna. 4. Mantenga esta posicin durante 3 a 5segundos. 5. Baje la pierna enderezada y vuelva a flexionarla. 6. Baje lentamente la cadera a la posicin inicial. 7. Repita estos pasos con la otra pierna. Puente lateral 1. Recustese de costado con las rodillas flexionadas. Apyese en el codo que est cerca del suelo. 2. Usando sus msculos abdominales y el codo que est apoyado en el suelo, levante el cuerpo del suelo. [place] la cadera de [employer] hombros, la cadera y el pie formen una lnea recta. 3. Mantenga esta posicin durante 10segundos. Mantenga la cabeza y el cuello levantados y lejos del hombro (en su posicin normal, neutral). Mantenga los msculos abdominales contrados. 4. Baje lentamente la cadera a la posicin inicial. 5. Repita e intente mantener esta posicin por ms tiempo, hasta llegar a mantenerla por 30 segundos. Abdominales 1. Acustese boca arriba sobre una superficie firme. Doble las rodillas y [employer] pies planos sobre el piso. 2. Cruce los [employer]. 3. Sin doblar el cuello, baje levemente el mentn en direccin al pecho. 4. Contraiga los msculos abdominales mientras levanta el pecho lo suficiente como para Administrator los omplatos del suelo. No  contenga la respiracin. Puede hacer esto con elevaciones cortas o largas. 5. Vuelva lentamente a la posicin inicial. Superman en cuadrupedia o bird dog 1. [place] y las rodillas, con las piernas paralelas a los hombros y los brazos debajo de los hombros. Mantenga la espalda recta. 2. Contraiga los msculos abdominales. 3. [place] una pierna del piso y endercela. Intente mantenerla paralela al piso. 4. Baje lentamente la pierna hasta la posicin inicial. 5. [place] un brazo del piso y endercelo. Intente mantenerlo paralelo al piso. 6. Baje lentamente el brazo hasta la posicin inicial. 7. Repita con el otro brazo y la otra pierna. Si es posible, intente levantar una pierna y un brazo al [employer], en los lados opuestos del cuerpo. Por ejemplo, levante el brazo izquierdo y Personnel officer. [place] 1. Acustese boca abajo. 2. Apoye el cuerpo [employer] y  los pies, manteniendo las piernas rectas. El cuerpo debe formar una lnea recta [employer] hombros y los pies. 3. Sostenga esta posicin durante 10 segundos mientras mantiene los msculos abdominales contrados. 4. Baje el cuerpo hasta la posicin inicial. 5. Repita e intente mantener esta posicin por ms tiempo, hasta llegar a mantenerla por 30 segundos. Fortalecimiento cruzado 1. Prese con los pies separados al ancho de los hombros. 2. Ladoris Gene pelota frente a usted. Mantenga los brazos extendidos. 3. Contraiga los msculos abdominales y gire lentamente la cintura de un lado a otro. Mantenga los pies planos. 4. Cuando est en una posicin cmoda, trate de repetir este ejercicio con una pelota ms pesada. Fortalecimiento de Tourist information centre manager superior del tronco 1. Prese a una distancia aproximada de 18pulgadas (46cm) de la pared, con la espalda contra la pared. 2. Mantenga los pies planos y paralelos a los hombros. 3. Contraiga los msculos abdominales. 4. Doble la cadera y las rodillas. 5. Lentamente,  intente tocar [employer] pared que se encuentra detrs de usted. 6. Lentamente, vuelva a incorporarse. 7. [employer] brazos sobre la cabeza e intente tocar la pared que se encuentra detrs de usted. 8. Regrese a la posicin inicial. Consejos generales  No haga ningn ejercicio que le cause dolor. Si siente dolor mientras hace ejercicio, hable con el mdico.  Siempre elongue antes y despus de hacer estos ejercicios. Esto puede ayudar a Building surveyor.  Mantenga un peso saludable. Pregunte a su mdico cul es el peso saludable para usted. Comunquese con un mdico si:  Tiene un dolor en la espalda que empeora o no desaparece.  Siente dolor al hacer ejercicios para fortalecer los msculos del tronco. Solicite ayuda de inmediato si:  Tiene un dolor intenso que no mejora con medicamentos. Resumen  Los ejercicios para fortalecer los msculos del tronco ayudan a Software engineer los msculos que se encuentran entre las costillas y la cadera.  Los msculos del tronco ayudan a Engineer, technical sales cuerpo y Marketing executive la columna vertebral.  [place] actividades, como yoga y pilates, pueden ayudar a Software engineer los msculos del tronco.  [place] ejercicios para fortalecer los msculos del tronco pueden ayudar a Best boy de espalda y a Engineer, materials.  Si siente dolor al hacer ejercicios para fortalecer los msculos del tronco, [place]. Esta informacin no tiene Marine scientist el consejo del mdico. Asegrese de hacerle al mdico cualquier pregunta que tenga. Document Released: 09/30/2016 Document Revised: 09/30/2016 Document Reviewed: 09/30/2016 Elsevier Interactive Patient Education  2019 [employer].

## 2018-08-10 NOTE — Progress Notes (Signed)
BP 100/67   Pulse 71   Temp 97.6 F (36.4 C) (Oral)   Ht 5\' 4"  (1.626 m)   Wt 152 lb (68.9 kg)   SpO2 97%   BMI 26.09 kg/m    Subjective:    Patient ID: Karen Walker., female    DOB: [birth year], 48 y.o.   MRN: [id number]  HPI: Karen Walker is a 48 y.o. female  Chief Complaint  Patient presents with  . Dysuria    pt states tha ther urine smells strong x about 3 months. gets worse after intercouse  . Back Pain    lower back pressure that runs up to her shoulders and down to her legs  . Vaginal Pain   URINARY SYMPTOMS Duration: 3 months Dysuria: no Urinary frequency: yes Urgency: yes Small volume voids: no Symptom severity: moderate Urinary incontinence: yes Foul odor: yes Hematuria: no Abdominal pain: yes Back pain: yes Suprapubic pain/pressure: yes Flank pain: no Fever:  no Vomiting: no Relief with cranberry juice: no Relief with pyridium: no Status: worse Previous urinary tract infection: yes Recurrent urinary tract infection: no Sexual activity: monogomous History of sexually transmitted disease: no Vaginal discharge: yes Treatments attempted: increasing fluids   BACK PAIN Duration: couple months worse in the last month Mechanism of injury: unknown Location: whole back from her hips down into her L leg and up into her shoulders Onset: gradual Severity: severe Quality: aching Frequency: constant Radiation: up into her shoulders and down into her heel Aggravating factors: driving, sitting Alleviating factors: nothing Status: worse Treatments attempted: none, warm tea Relief with NSAIDs?: No NSAIDs Taken Nighttime pain:  yes Paresthesias / decreased sensation:  yes Bowel / bladder incontinence:  no Fevers:  no Dysuria / urinary frequency:  yes   Relevant past medical, surgical, family and social history reviewed and updated as indicated. Interim medical history since our last visit reviewed. Allergies and medications reviewed and updated.  Review of Systems  Constitutional: Negative.   Respiratory: Negative.   Cardiovascular: Negative.   Gastrointestinal: Negative.   Musculoskeletal: Positive for back pain and myalgias. Negative for arthralgias, gait problem, joint swelling, neck pain and neck stiffness.  Skin: Negative.   Neurological: Negative.   Psychiatric/Behavioral: Negative.     Per HPI unless specifically indicated above     Objective:    BP 100/67   Pulse 71   Temp 97.6 F (36.4 C) (Oral)   Ht 5\' 4"  (1.626 m)   Wt 152 lb (68.9 kg)   SpO2 97%   BMI 26.09 kg/m   Wt Readings from Last 3 Encounters:  08/10/18 152 lb (68.9 kg)  04/09/18 150 lb (68 kg)  02/12/18 151 lb 14.4 oz (68.9 kg)    Physical Exam Vitals signs and nursing note reviewed.  Constitutional:      General: She is not in acute distress.    Appearance: Normal appearance. She is not ill-appearing, toxic-appearing or diaphoretic.  HENT:     Head: Normocephalic and atraumatic.     Right Ear: External ear normal.     Left Ear: External ear normal.     Nose: Nose normal.     Mouth/Throat:     Mouth: Mucous membranes are moist.     Pharynx: Oropharynx is clear.  Eyes:     General: No scleral icterus.       Right eye: No discharge.        Left eye: No discharge.     Extraocular Movements: Extraocular movements  intact.     Conjunctiva/sclera: Conjunctivae normal.     Pupils: Pupils are equal, round, and reactive to light.  Neck:     Musculoskeletal: Normal range of motion and neck supple.  Cardiovascular:     Rate and Rhythm: Normal rate and regular rhythm.     Pulses: Normal pulses.     Heart sounds: Normal heart sounds. No murmur. No friction rub. No gallop.   Pulmonary:     Effort: Pulmonary effort is normal. No respiratory distress.     Breath sounds: Normal breath sounds. No stridor. No wheezing, rhonchi or rales.  Chest:     Chest wall: No tenderness.  Musculoskeletal: Normal range of motion.  Skin:    General: Skin is  warm and dry.     Capillary Refill: Capillary refill takes less than 2 seconds.     Coloration: Skin is not jaundiced or pale.     Findings: No bruising, erythema, lesion or rash.  Neurological:     General: No focal deficit present.     Mental Status: She is alert and oriented to person, place, and time. Mental status is at baseline.  Psychiatric:        Mood and Affect: Mood normal.        Behavior: Behavior normal.        Thought Content: Thought content normal.        Judgment: Judgment normal.   Back Exam:    Inspection:  Normal spinal curvature.  No deformity, ecchymosis, erythema, or lesions     Palpation:     Midline spinal tenderness: no      Paralumbar tenderness: yes Left     Parathoracic tenderness: yes Right     Buttocks tenderness: no     Range of Motion:      Flexion: Fingers to Knees     Extension:Decreased     Lateral bending:Decreased    Rotation:Decreased    Neuro Exam:Lower extremity DTRs normal & symmetric.  Strength and sensation intact.    Special Tests:      Straight leg raise:negative  Results for orders placed or performed in visit on 02/12/18  HIV antibody (with reflex)  Result Value Ref Range   HIV Screen 4th Generation wRfx Non Reactive Non Reactive  Bayer DCA Hb A1c Waived  Result Value Ref Range   HB A1C (BAYER DCA - WAIVED) 5.4 <7.0 %  CBC with Differential/Platelet  Result Value Ref Range   WBC 4.0 3.4 - 10.8 x10E3/uL   RBC 4.20 3.77 - 5.28 x10E6/uL   Hemoglobin 12.6 11.1 - 15.9 g/dL   Hematocrit 38.0 34.0 - 46.6 %   MCV 91 79 - 97 fL   MCH 30.0 26.6 - 33.0 pg   MCHC 33.2 31.5 - 35.7 g/dL   RDW 12.3 12.3 - 15.4 %   Platelets 195 150 - 450 x10E3/uL   Neutrophils 52 Not Estab. %   Lymphs 37 Not Estab. %   Monocytes 6 Not Estab. %   Eos 5 Not Estab. %   Basos 0 Not Estab. %   Neutrophils Absolute 2.1 1.4 - 7.0 x10E3/uL   Lymphocytes Absolute 1.5 0.7 - 3.1 x10E3/uL   Monocytes Absolute 0.2 0.1 - 0.9 x10E3/uL   EOS (ABSOLUTE) 0.2 0.0  - 0.4 x10E3/uL   Basophils Absolute 0.0 0.0 - 0.2 x10E3/uL   Immature Granulocytes 0 Not Estab. %   Immature Grans (Abs) 0.0 0.0 - 0.1 x10E3/uL  Comprehensive metabolic panel  Result Value  Ref Range   Glucose 62 (L) 65 - 99 mg/dL   BUN 15 6 - 24 mg/dL   Creatinine, Ser 0.69 0.57 - 1.00 mg/dL   GFR calc non Af Amer 103 >59 mL/min/1.73   GFR calc Af Amer 119 >59 mL/min/1.73   BUN/Creatinine Ratio 22 9 - 23   Sodium 143 134 - 144 mmol/L   Potassium 3.8 3.5 - 5.2 mmol/L   Chloride 103 96 - 106 mmol/L   CO2 26 20 - 29 mmol/L   Calcium 9.2 8.7 - 10.2 mg/dL   Total Protein 6.9 6.0 - 8.5 g/dL   Albumin 4.1 3.5 - 5.5 g/dL   Globulin, Total 2.8 1.5 - 4.5 g/dL   Albumin/Globulin Ratio 1.5 1.2 - 2.2   Bilirubin Total 0.5 0.0 - 1.2 mg/dL   Alkaline Phosphatase 53 39 - 117 IU/L   AST 18 0 - 40 IU/L   ALT 23 0 - 32 IU/L  Lipid Panel w/o Chol/HDL Ratio  Result Value Ref Range   Cholesterol, Total 176 100 - 199 mg/dL   Triglycerides 135 0 - 149 mg/dL   HDL 57 >39 mg/dL   VLDL Cholesterol Cal 27 5 - 40 mg/dL   LDL Calculated 92 0 - 99 mg/dL  TSH  Result Value Ref Range   TSH 2.020 0.450 - 4.500 uIU/mL  UA/M w/rflx Culture, Routine  Result Value Ref Range   Specific Gravity, UA 1.015 1.005 - 1.030   pH, UA 7.5 5.0 - 7.5   Color, UA Yellow Yellow   Appearance Ur Clear Clear   Leukocytes, UA Negative Negative   Protein, UA Negative Negative/Trace   Glucose, UA Negative Negative   Ketones, UA Negative Negative   RBC, UA Negative Negative   Bilirubin, UA Negative Negative   Urobilinogen, Ur 0.2 0.2 - 1.0 mg/dL   Nitrite, UA Negative Negative      Assessment & Plan:   Problem List Items Addressed This Visit    None    Visit Diagnoses    Acute cystitis without hematuria    -  Primary   Will treat with nitrofurantoin. Call with any concerns or if not getting better.    Dysuria       + nitrates   Relevant Orders   UA/M w/rflx Culture, Routine   Acute low back pain without  sciatica, unspecified back pain laterality       Will treat with exercises and naproxen. Call if not getting better or getting worse.    Relevant Medications   naproxen (NAPROSYN) 500 MG tablet       Follow up plan: Return if symptoms worsen or fail to improve.

## 2018-08-13 ENCOUNTER — Ambulatory Visit: Payer: Self-pay | Admitting: *Deleted

## 2018-08-13 LAB — URINE CULTURE, REFLEX

## 2018-08-13 LAB — UA/M W/RFLX CULTURE, ROUTINE
Bilirubin, UA: NEGATIVE
Glucose, UA: NEGATIVE
Ketones, UA: NEGATIVE
Leukocytes,UA: NEGATIVE
Nitrite, UA: POSITIVE — AB
Protein,UA: NEGATIVE
RBC, UA: NEGATIVE
Specific Gravity, UA: 1.02 (ref 1.005–1.030)
Urobilinogen, Ur: 0.2 mg/dL (ref 0.2–1.0)
pH, UA: 6.5 (ref 5.0–7.5)

## 2018-08-13 LAB — MICROSCOPIC EXAMINATION: RBC: NONE SEEN /hpf (ref 0–2)

## 2018-08-13 NOTE — Telephone Encounter (Signed)
Interpreter ID: [id number]  Interpreter relayed message to patient. She is aware to seek care at Urgent Care if symptoms worsen over the weekend. Pt aware we will call her once culture is back, hopefully, on Monday.

## 2018-08-13 NOTE — Telephone Encounter (Signed)
Interpreter on line, ID# [id number]  Pt reports abdominal pain after taking naproxen and macrobid, prescribed 08/10/2018,  cystitis. States pain is brief, 5-10 minutes after taking meds.then resolves.  Is taking both MAcrobid and Naproxen as ordered. States pain is "Burning type pain." Also reports constipation, LBM today "But had to push a lot." No nausea, no vomiting or diarrhea. Pt assured [place] would route to practice for recommendation. Advised to go to UC if symptoms worsen. Please advise: CB# [phone]  Reason for Disposition . Caller has NON-URGENT medication question about med that PCP prescribed and triager unable to answer question  Answer Assessment - Initial Assessment Questions 1. SYMPTOMS: "Do you have any symptoms?"     Yes 2. SEVERITY: If symptoms are present, ask "Are they mild, moderate or severe?"    Moderate, brief  Protocols used: MEDICATION QUESTION CALL-A-AH

## 2018-08-13 NOTE — Telephone Encounter (Signed)
Please make sure she's taking the medicine with food. We are still waiting on the culture

## 2018-08-13 NOTE — Telephone Encounter (Signed)
Please let her know that her culture grew out sensitive to the macrobid. If she is not feeling better by Monday- let me know

## 2018-08-16 NOTE — Telephone Encounter (Signed)
Called patient via the language line Karen Walker Resides ID: [phone]). Pt stated she is feeling better today. Pt was advised to call back if she did not continue to get better or symptoms worsened.

## 2018-08-20 ENCOUNTER — Telehealth: Payer: Self-pay | Admitting: Family Medicine

## 2018-08-20 DIAGNOSIS — M545 Low back pain, unspecified: Secondary | ICD-10-CM

## 2018-08-20 NOTE — Telephone Encounter (Signed)
There does not appear to be a referral option for massage therapy in the computer. I'll put in chiropractry and we can see if that will work.

## 2018-08-20 NOTE — Telephone Encounter (Signed)
Copied from [place] [phone]. Topic: General - Other >> Aug 20, 2018  4:07 PM Celene Kras A wrote: Reason for CRM: Pt called requesting to have a referral put in for a massage therapist. Pt states she is experiencing back pain and other body pain due to stress. She states she has been to a massage therapist before and if possible would like to have her again. Please advise.

## 2018-08-27 ENCOUNTER — Ambulatory Visit (INDEPENDENT_AMBULATORY_CARE_PROVIDER_SITE_OTHER): Payer: 59 | Admitting: Family Medicine

## 2018-08-27 ENCOUNTER — Ambulatory Visit: Payer: Self-pay | Admitting: *Deleted

## 2018-08-27 ENCOUNTER — Encounter: Payer: Self-pay | Admitting: Family Medicine

## 2018-08-27 ENCOUNTER — Other Ambulatory Visit: Payer: Self-pay

## 2018-08-27 VITALS — Temp 97.4°F

## 2018-08-27 DIAGNOSIS — J069 Acute upper respiratory infection, unspecified: Secondary | ICD-10-CM | POA: Diagnosis not present

## 2018-08-27 DIAGNOSIS — R6889 Other general symptoms and signs: Secondary | ICD-10-CM | POA: Diagnosis not present

## 2018-08-27 DIAGNOSIS — Z20822 Contact with and (suspected) exposure to covid-19: Secondary | ICD-10-CM | POA: Insufficient documentation

## 2018-08-27 NOTE — Progress Notes (Signed)
Temp (!) 97.4 F (36.3 C)    Subjective:    Patient ID: Karen Mess., female    DOB: July 26, 1970, 48 y.o.   MRN: [id number]  HPI: Karen Walker is a 48 y.o. female  Chief Complaint  Patient presents with   URI    pt states she started having weakness and a headache yesterday   UPPER RESPIRATORY TRACT INFECTION Duration: yesterday Worst symptom: weakness, body aches, headache, chills and sweats Fever: unknown- chills and sweats Cough: yes Shortness of breath: yes Wheezing: yes Chest pain: yes Chest tightness: yes Chest congestion: yes Nasal congestion: yes Runny nose: yes Post nasal drip: yes Sneezing: yes Sore throat: yes Swollen glands: no Sinus pressure: yes Headache: yes Face pain: no Toothache: no Ear pain: no  Ear pressure: yes bilateral Eyes red/itching:no Eye drainage/crusting: no  Vomiting: yes Rash: no Fatigue: yes Sick contacts: yes Strep contacts: no  Context: worse Recurrent sinusitis: no Relief with OTC cold/cough medications: no  Treatments attempted: none   Relevant past medical, surgical, family and social history reviewed and updated as indicated. Interim medical history since our last visit reviewed. Allergies and medications reviewed and updated.  Review of Systems  Constitutional: Positive for chills, diaphoresis and fatigue. Negative for activity change, appetite change, fever and unexpected weight change.  HENT: Positive for congestion, postnasal drip, rhinorrhea, sinus pressure, sneezing and sore throat. Negative for dental problem, drooling, ear discharge, ear pain, facial swelling, hearing loss, mouth sores, nosebleeds, sinus pain, tinnitus, trouble swallowing and voice change.   Respiratory: Positive for apnea, cough, chest tightness, shortness of breath and wheezing. Negative for choking and stridor.   Cardiovascular: Negative.   Gastrointestinal: Positive for nausea and vomiting. Negative for abdominal distention, abdominal  pain, anal bleeding, blood in stool, constipation, diarrhea and rectal pain.  Skin: Negative.   Psychiatric/Behavioral: Negative.     Per HPI unless specifically indicated above     Objective:    Temp (!) 97.4 F (36.3 C)   Wt Readings from Last 3 Encounters:  08/10/18 152 lb (68.9 kg)  04/09/18 150 lb (68 kg)  02/12/18 151 lb 14.4 oz (68.9 kg)    Physical Exam Vitals signs and nursing note reviewed.  Constitutional:      General: She is not in acute distress.    Appearance: Normal appearance. She is not ill-appearing, toxic-appearing or diaphoretic.  HENT:     Head: Normocephalic and atraumatic.     Right Ear: External ear normal.     Left Ear: External ear normal.     Nose: Nose normal.     Mouth/Throat:     Mouth: Mucous membranes are moist.     Pharynx: Oropharynx is clear.  Eyes:     General: No scleral icterus.       Right eye: No discharge.        Left eye: No discharge.     Conjunctiva/sclera: Conjunctivae normal.     Pupils: Pupils are equal, round, and reactive to light.  Neck:     Musculoskeletal: Normal range of motion.  Pulmonary:     Effort: Pulmonary effort is normal. No respiratory distress.     Comments: Speaking in full sentences Musculoskeletal: Normal range of motion.  Skin:    Coloration: Skin is not jaundiced or pale.     Findings: No bruising, erythema, lesion or rash.  Neurological:     Mental Status: She is alert and oriented to Walker, place, and time. Mental status is at  baseline.  Psychiatric:        Mood and Affect: Mood normal.        Behavior: Behavior normal.        Thought Content: Thought content normal.        Judgment: Judgment normal.     Results for orders placed or performed in visit on 08/10/18  Microscopic Examination  Result Value Ref Range   WBC, UA 0-5 0 - 5 /hpf   RBC None seen 0 - 2 /hpf   Epithelial Cells (non renal) 0-10 0 - 10 /hpf   Bacteria, UA Many (A) None seen/Few  Urine Culture, Reflex  Result Value  Ref Range   Urine Culture, Routine Final report (A)    Organism ID, Bacteria Escherichia coli (A)    Antimicrobial Susceptibility Comment   UA/M w/rflx Culture, Routine  Result Value Ref Range   Specific Gravity, UA 1.020 1.005 - 1.030   pH, UA 6.5 5.0 - 7.5   Color, UA Yellow Yellow   Appearance Ur Cloudy (A) Clear   Leukocytes,UA Negative Negative   Protein,UA Negative Negative/Trace   Glucose, UA Negative Negative   Ketones, UA Negative Negative   RBC, UA Negative Negative   Bilirubin, UA Negative Negative   Urobilinogen, Ur 0.2 0.2 - 1.0 mg/dL   Nitrite, UA Positive (A) Negative   Microscopic Examination See below:    Urinalysis Reflex Comment       Assessment & Plan:   Problem List Items Addressed This Visit      Other   Suspected Covid-19 Virus Infection - Primary    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 Will call health at work to be tested- if they will not test her, I will order testing. Await results. Advised self-quarantine away from family until she knows results.           Follow up plan: Return Tuesday, for follow up URI.     This visit was completed via FaceTime due to the restrictions of the COVID-19 pandemic. All issues as above were discussed and addressed. Physical exam was done as above through visual confirmation on FaceTime. If it was felt that the patient should be evaluated in the office, they were directed there. The patient verbally consented to this visit.  Location of the patient: home  Location of the provider: home  Those involved with this call:   Provider: Park Liter, DO  CMA: Yvonna Alanis, [place] Desk/Registration: Don Perking   Time spent on call: 15 minutes with patient face to face via video conference. More than 50% of this time was spent in counseling and coordination of care. 23 minutes total spent in review of patient's record and preparation of their chart.

## 2018-08-27 NOTE — Assessment & Plan Note (Signed)
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 Will call health at work to be tested- if they will not test her, I will order testing. Await results. Advised self-quarantine away from family until she knows results.

## 2018-08-27 NOTE — Telephone Encounter (Signed)
Pt called with complaints stomach pain, vomiting, and headache which started 08/26/2018; the pt says that she left work with a headache,and then started having chills and body aches; she says that 2030 she started having a stomach ache and vomiting x 3; she has not had vomiting this am; the pt says that today she is having weakness which is the worst of her symptoms; she works at [employer]; the pt says she has a history history of migraines; recommendations made per nurse triage protocol; she verbalized understanding; pt transferred to [facility] for scheduling.     Reason for Disposition . MILD difficulty breathing (e.g., minimal/no SOB at rest, SOB with walking, pulse <100)  Answer Assessment - Initial Assessment Questions 1. COVID-19 DIAGNOSIS: "Who made your Coronavirus (COVID-19) diagnosis?" "Was it confirmed by a positive lab test?" If not diagnosed by a HCP, ask "Are there lots of cases (community spread) where you live?" (See public health department website, if unsure)   * MAJOR community spread: high number of cases; numbers of cases are increasing; many people hospitalized.   * MINOR community spread: low number of cases; not increasing; few or no people hospitalized     major 2. ONSET: "When did the COVID-19 symptoms start?"     08/26/2018 3. WORST SYMPTOM: "What is your worst symptom?" (e.g., cough, fever, shortness of breath, muscle aches)     weakness 4. COUGH: "Do you have a cough?" If so, ask: "How bad is the cough?"      no 5. FEVER: "Do you have a fever?" If so, ask: "What is your temperature, how was it measured, and when did it start?"    99.0 08/27/2018 at 0924 with oral digital thermometer 6. RESPIRATORY STATUS: "Describe your breathing?" (e.g., shortness of breath, wheezing, unable to speak)      Short of breath (hard to take a deep breath) 7. BETTER-SAME-WORSE: "Are you getting better, staying the same or getting worse compared to yesterday?"  If getting worse, ask, "In what  way?"     Better because headache is not as bad 8. HIGH RISK DISEASE: "Do you have any chronic medical problems?" (e.g., asthma, heart or lung disease, weak immune system, etc.)     no 9. PREGNANCY: "Is there any chance you are pregnant?" "When was your last menstrual period?"     No hysterectomy 10. OTHER SYMPTOMS: "Do you have any other symptoms?"  (e.g., runny nose, headache, sore throat, loss of smell)      Body aches, chills, runny nose, headache  Protocols used: CORONAVIRUS (COVID-19) DIAGNOSED OR SUSPECTED-A-AH

## 2018-08-31 ENCOUNTER — Other Ambulatory Visit: Payer: Self-pay

## 2018-08-31 ENCOUNTER — Ambulatory Visit (INDEPENDENT_AMBULATORY_CARE_PROVIDER_SITE_OTHER): Payer: 59 | Admitting: Family Medicine

## 2018-08-31 ENCOUNTER — Encounter: Payer: Self-pay | Admitting: Family Medicine

## 2018-08-31 DIAGNOSIS — Z20822 Contact with and (suspected) exposure to covid-19: Secondary | ICD-10-CM

## 2018-08-31 DIAGNOSIS — J069 Acute upper respiratory infection, unspecified: Secondary | ICD-10-CM

## 2018-08-31 DIAGNOSIS — R6889 Other general symptoms and signs: Secondary | ICD-10-CM | POA: Diagnosis not present

## 2018-08-31 NOTE — Progress Notes (Signed)
There were no vitals taken for this visit.   Subjective:    Patient ID: Karen Mess., female    DOB: May 02, 1970, 48 y.o.   MRN: [id number]  HPI: Karen Walker is a 48 y.o. female  Chief Complaint  Patient presents with  . URI    Symptoms started last Thursday, has resolved. Headache, stomach ache, vomitting, Fever   Feeling much much better. Feeling back to herself. SOB and diarrhea have resolved. Breathing doing doing well. Had COVID testing and was negative. OK to return to work in 3 days. No other concerns or complaints at this time.   Relevant past medical, surgical, family and social history reviewed and updated as indicated. Interim medical history since our last visit reviewed. Allergies and medications reviewed and updated.  Review of Systems  Constitutional: Negative.   HENT: Negative.   Respiratory: Negative.   Cardiovascular: Negative.   Musculoskeletal: Negative.   Psychiatric/Behavioral: Negative.     Per HPI unless specifically indicated above     Objective:    There were no vitals taken for this visit.  Wt Readings from Last 3 Encounters:  08/10/18 152 lb (68.9 kg)  04/09/18 150 lb (68 kg)  02/12/18 151 lb 14.4 oz (68.9 kg)    Physical Exam Vitals signs and nursing note reviewed.  Constitutional:      General: She is not in acute distress.    Appearance: Normal appearance. She is not ill-appearing, toxic-appearing or diaphoretic.  HENT:     Head: Normocephalic and atraumatic.     Right Ear: External ear normal.     Left Ear: External ear normal.     Nose: Nose normal.     Mouth/Throat:     Mouth: Mucous membranes are moist.     Pharynx: Oropharynx is clear.  Eyes:     General: No scleral icterus.       Right eye: No discharge.        Left eye: No discharge.     Conjunctiva/sclera: Conjunctivae normal.     Pupils: Pupils are equal, round, and reactive to light.  Neck:     Musculoskeletal: Normal range of motion.  Pulmonary:     Effort:  Pulmonary effort is normal. No respiratory distress.     Comments: Speaking in full sentences Musculoskeletal: Normal range of motion.  Skin:    Coloration: Skin is not jaundiced or pale.     Findings: No bruising, erythema, lesion or rash.  Neurological:     Mental Status: She is alert and oriented to person, place, and time. Mental status is at baseline.  Psychiatric:        Mood and Affect: Mood normal.        Behavior: Behavior normal.        Thought Content: Thought content normal.        Judgment: Judgment normal.     Results for orders placed or performed in visit on 08/10/18  Microscopic Examination  Result Value Ref Range   WBC, UA 0-5 0 - 5 /hpf   RBC None seen 0 - 2 /hpf   Epithelial Cells (non renal) 0-10 0 - 10 /hpf   Bacteria, UA Many (A) None seen/Few  Urine Culture, Reflex  Result Value Ref Range   Urine Culture, Routine Final report (A)    Organism ID, Bacteria Escherichia coli (A)    Antimicrobial Susceptibility Comment   UA/M w/rflx Culture, Routine  Result Value Ref Range   Specific Gravity,  UA 1.020 1.005 - 1.030   pH, UA 6.5 5.0 - 7.5   Color, UA Yellow Yellow   Appearance Ur Cloudy (A) Clear   Leukocytes,UA Negative Negative   Protein,UA Negative Negative/Trace   Glucose, UA Negative Negative   Ketones, UA Negative Negative   RBC, UA Negative Negative   Bilirubin, UA Negative Negative   Urobilinogen, Ur 0.2 0.2 - 1.0 mg/dL   Nitrite, UA Positive (A) Negative   Microscopic Examination See below:    Urinalysis Reflex Comment       Assessment & Plan:   Problem List Items Addressed This Visit      Other   Suspected Covid-19 Virus Infection - Primary    Tested last week, came back negative. OK to return to work on in 3 days. Call with any concerns. Continue to monitor.           Follow up plan: Return if symptoms worsen or fail to improve.    . This visit was completed via FaceTime due to the restrictions of the COVID-19 pandemic. All  issues as above were discussed and addressed. Physical exam was done as above through visual confirmation on FaceTime. If it was felt that the patient should be evaluated in the office, they were directed there. The patient verbally consented to this visit. . Location of the patient: parking lot . Location of the provider: work . Those involved with this call:  . Provider: Park Liter, DO . CMA: Gerda Diss, CMA . Front Desk/Registration: Don Perking  . Time spent on call: 10 minutes with patient face to face via video conference. More than 50% of this time was spent in counseling and coordination of care. 15 minutes total spent in review of patient's record and preparation of their chart.

## 2018-08-31 NOTE — Assessment & Plan Note (Signed)
Tested last week, came back negative. OK to return to work on in 3 days. Call with any concerns. Continue to monitor.

## 2018-09-01 ENCOUNTER — Telehealth: Payer: Self-pay

## 2018-09-01 NOTE — Telephone Encounter (Signed)
Pt didn't complete my chart covid question templet

## 2018-09-02 ENCOUNTER — Telehealth: Payer: Self-pay | Admitting: Family Medicine

## 2018-09-02 ENCOUNTER — Encounter: Payer: Self-pay | Admitting: Family Medicine

## 2018-09-02 NOTE — Telephone Encounter (Signed)
Note on back printer for her. Thanks.

## 2018-09-02 NOTE — Telephone Encounter (Signed)
Pt would like to know if it is ok for her to return to work this weekend. Please advise.

## 2018-09-02 NOTE — Telephone Encounter (Signed)
She told me that she was cleared by health at work. As long as she's good with them, she's good with me. Let me know if she needs a note.

## 2018-09-02 NOTE — Telephone Encounter (Signed)
Patient notified

## 2018-09-02 NOTE — Telephone Encounter (Signed)
She would like a note

## 2018-09-03 ENCOUNTER — Telehealth: Payer: Self-pay | Admitting: Family Medicine

## 2018-09-03 NOTE — Telephone Encounter (Signed)
Pt had OV with [facility] 5/26 and reports feeling better. Pt was to return to work after another 3 days. Pt to call PCP if any other concerns

## 2018-09-06 ENCOUNTER — Ambulatory Visit: Payer: 59 | Admitting: Family Medicine

## 2018-09-16 ENCOUNTER — Other Ambulatory Visit: Payer: Self-pay

## 2018-09-16 ENCOUNTER — Encounter: Payer: Self-pay | Admitting: Family Medicine

## 2018-09-16 ENCOUNTER — Ambulatory Visit (INDEPENDENT_AMBULATORY_CARE_PROVIDER_SITE_OTHER): Payer: 59 | Admitting: Family Medicine

## 2018-09-16 DIAGNOSIS — M5441 Lumbago with sciatica, right side: Secondary | ICD-10-CM | POA: Diagnosis not present

## 2018-09-16 MED ORDER — CYCLOBENZAPRINE HCL 10 MG PO TABS: 10.0000 mg | ORAL_TABLET | Freq: Every day | ORAL | 0 refills | Status: DC

## 2018-09-16 NOTE — Progress Notes (Signed)
There were no vitals taken for this visit.   Subjective:    Patient ID: Karen Mess., female    DOB: 1970/11/14, 48 y.o.   MRN: [id number]  HPI: Karen Walker is a 48 y.o. female  Chief Complaint  Patient presents with  . Back Pain    Twisted her lower back on Saturday. Constant pain.    BACK PAIN Duration: Saturday Mechanism of injury: lifting and twisting Location: Right and low back Onset: sudden Severity: severe Quality: pressure and  Frequency: constant Radiation: R leg below the knee Aggravating factors: lifting, movement, walking, laying and bending Alleviating factors: brace, sitting Status: stable Treatments attempted: naproxen, brace   Relief with NSAIDs?: moderate Nighttime pain:  no Paresthesias / decreased sensation:  no Bowel / bladder incontinence:  no Fevers:  no Dysuria / urinary frequency:  no  Relevant past medical, surgical, family and social history reviewed and updated as indicated. Interim medical history since our last visit reviewed. Allergies and medications reviewed and updated.  Review of Systems  Constitutional: Negative.   Respiratory: Negative.   Cardiovascular: Negative.   Musculoskeletal: Positive for back pain and myalgias. Negative for arthralgias, gait problem, joint swelling, neck pain and neck stiffness.  Skin: Negative.   Neurological: Negative.   Psychiatric/Behavioral: Negative.     Per HPI unless specifically indicated above     Objective:    There were no vitals taken for this visit.  Wt Readings from Last 3 Encounters:  08/10/18 152 lb (68.9 kg)  04/09/18 150 lb (68 kg)  02/12/18 151 lb 14.4 oz (68.9 kg)    Physical Exam Vitals signs and nursing note reviewed.  Constitutional:      General: She is not in acute distress.    Appearance: Normal appearance. She is not ill-appearing, toxic-appearing or diaphoretic.  HENT:     Head: Normocephalic and atraumatic.     Right Ear: External ear normal.     Left  Ear: External ear normal.     Nose: Nose normal.     Mouth/Throat:     Mouth: Mucous membranes are moist.     Pharynx: Oropharynx is clear.  Eyes:     General: No scleral icterus.       Right eye: No discharge.        Left eye: No discharge.     Conjunctiva/sclera: Conjunctivae normal.     Pupils: Pupils are equal, round, and reactive to light.  Neck:     Musculoskeletal: Normal range of motion.  Pulmonary:     Effort: Pulmonary effort is normal. No respiratory distress.     Comments: Speaking in full sentences Musculoskeletal: Normal range of motion.  Skin:    Coloration: Skin is not jaundiced or pale.     Findings: No bruising, erythema, lesion or rash.  Neurological:     Mental Status: She is alert and oriented to person, place, and time. Mental status is at baseline.  Psychiatric:        Mood and Affect: Mood normal.        Behavior: Behavior normal.        Thought Content: Thought content normal.        Judgment: Judgment normal.     Results for orders placed or performed in visit on 08/10/18  Microscopic Examination   URINE  Result Value Ref Range   WBC, UA 0-5 0 - 5 /hpf   RBC None seen 0 - 2 /hpf   Epithelial Cells (  non renal) 0-10 0 - 10 /hpf   Bacteria, UA Many (A) None seen/Few  Urine Culture, Reflex   URINE  Result Value Ref Range   Urine Culture, Routine Final report (A)    Organism ID, Bacteria Escherichia coli (A)    Antimicrobial Susceptibility Comment   UA/M w/rflx Culture, Routine   Specimen: Urine   URINE  Result Value Ref Range   Specific Gravity, UA 1.020 1.005 - 1.030   pH, UA 6.5 5.0 - 7.5   Color, UA Yellow Yellow   Appearance Ur Cloudy (A) Clear   Leukocytes,UA Negative Negative   Protein,UA Negative Negative/Trace   Glucose, UA Negative Negative   Ketones, UA Negative Negative   RBC, UA Negative Negative   Bilirubin, UA Negative Negative   Urobilinogen, Ur 0.2 0.2 - 1.0 mg/dL   Nitrite, UA Positive (A) Negative   Microscopic  Examination See below:    Urinalysis Reflex Comment       Assessment & Plan:   Problem List Items Addressed This Visit    None    Visit Diagnoses    Acute right-sided low back pain with right-sided sciatica    -  Primary   Will treat with naproxen, stretches and flexeril. Call if not getting better in the next 2 weeks. Out of work until Monday. Call with any concerns.    Relevant Medications   cyclobenzaprine (FLEXERIL) 10 MG tablet       Follow up plan: Return in about 2 weeks (around 09/30/2018).   . This visit was completed via FaceTime due to the restrictions of the COVID-19 pandemic. All issues as above were discussed and addressed. Physical exam was done as above through visual confirmation on FaceTime. If it was felt that the patient should be evaluated in the office, they were directed there. The patient verbally consented to this visit. . Location of the patient: home . Location of the provider: work . Those involved with this call:  . Provider: Park Liter, DO . CMA: Gerda Diss, CMA . Front Desk/Registration: Don Perking  . Time spent on call: 15 minutes with patient face to face via video conference. More than 50% of this time was spent in counseling and coordination of care. 23 minutes total spent in review of patient's record and preparation of their chart.

## 2018-09-16 NOTE — Patient Instructions (Signed)
Rehabilitación para la ciática  [facility]  Consulte al médico qué ejercicios son seguros para usted. Haga los ejercicios exactamente como se lo haya indicado el médico y gradúelos como se lo hayan indicado. Es normal sentir un leve estiramiento, tirón, rigidez o molestia cuando haga estos ejercicios, pero debe detenerse de inmediato si siente un dolor repentino o si el dolor empeora. No comience a hacer estos ejercicios hasta que se lo indique el médico.  Ejercicios de elongación y amplitud de movimiento  Estos ejercicios calientan los músculos y las articulaciones, y mejoran el movimiento y la flexibilidad de la cadera y la espalda. Estos ejercicios también ayudan a aliviar el dolor, el adormecimiento y el hormigueo.  Ejercicio A: Deslizamiento del nervio ciático  1. Siéntese en una silla con la cabeza hacia abajo en dirección al pecho. Coloque las manos detrás de la espalda. Deje caer los hombros hacia adelante.  2. Extienda lentamente una de las rodillas mientras inclina la cabeza hacia atrás como si estuviera mirando hacia el techo. Solo extienda la pierna tan lejos como pueda sin empeorar los síntomas.  3. Mantenga esta posición durante __________ segundos.  4. Vuelva lentamente a la posición inicial.  5. Repita el ejercicio con la otra pierna.  Repita __________ veces. Realice este ejercicio __________ veces al día.  Ejercicio B: Rodilla al pecho con aducción de cadera y rotación interna    1. Acuéstese boca arriba en una superficie firme con las piernas extendidas.  2. Flexione una rodilla y llévela hacia el pecho hasta que sienta un estiramiento suave en la parte inferior de la espalda y las nalgas. A continuación, mueva la rodilla hacia el hombro que está en el lado contrario de la pierna.  ? Mantenga la pierna en esta posición tomando la parte frontal de la rodilla.  3. Mantenga esta posición durante __________ segundos.  4. Vuelva lentamente a la posición inicial.  5. Repita el ejercicio con la otra  pierna.  Repita __________ veces. Realice este ejercicio __________ veces al día.  Ejercicio C: Extensión sobre los codos, en decúbito prono    1. Acuéstese boca abajo sobre una superficie firme. La cama puede ser demasiado blanda para este ejercicio.  2. Apóyese sobre los codos.  3. Con los brazos, ayúdese a levantar el pecho hasta sentir un leve estiramiento en el abdomen y la parte inferior de la espalda.  ? Esto colocará algo de peso corporal sobre los codos. Si no se siente cómodo, intente colocando almohadas debajo del pecho.  ? Debe dejar la cadera inmóvil sobre la superficie en la que esté apoyado. Mantenga la cadera y los músculos de la espalda relajados.  4. Mantenga esta posición durante __________ segundos.  5. Afloje lentamente la parte superior del cuerpo y vuelva a la posición inicial.  Repita __________ veces. Realice este ejercicio __________ veces al día.  Ejercicios de fortalecimiento  Estos ejercicios fortalecen la espalda y le otorgan resistencia. La resistencia es la capacidad de usar los músculos durante un tiempo prolongado, incluso después de que se cansen.  Ejercicio D: Inclinación de la pelvis  1. Acuéstese boca arriba sobre una superficie firme. Flexione las rodillas y mantenga los pies apoyados.  2. Tensione los músculos abdominales. Eleve la pelvis hacia el techo y aplane la parte inferior de la espalda contra el suelo.  ? Para realizar este ejercicio, puede colocar una toalla pequeña debajo de la parte inferior de la espalda y presionar la espalda contra la toalla.  3. Mantenga esta posición durante __________ segundos.    4. Relaje totalmente los músculos antes de repetir el ejercicio.  Repita __________ veces. Realice este ejercicio __________ veces al día.  Ejercicio E: Elevaciones alternadas de pierna y brazo    1. Apoye las palmas de las manos y las rodillas sobre una superficie firme. Si se colocará sobre una superficie muy dura, puede usar un elemento acolchado para apoyar las  rodillas, como una alfombrilla para ejercicios.  2. Alinee los brazos y las piernas. Las manos deben estar debajo de los hombros y las rodillas debajo de la cadera.  3. Eleve la pierna izquierda hacia atrás. Al mismo tiempo, eleve el brazo derecho y estírelo frente a usted.  ? No eleve la pierna por encima de la cadera.  ? No eleve el brazo por encima del hombro.  ? Mantenga los músculos del abdomen y de la espalda contraídos.  ? Mantenga la cadera mirando hacia el suelo.  ? No arquee la espalda.  ? Mantenga el equilibrio con cuidado y no contenga la respiración.  4. Mantenga esta posición durante __________ segundos.  5. Lentamente regrese a la posición inicial y repita el ejercicio con la pierna derecha y el brazo izquierdo.  Repita __________ veces. Realice este ejercicio __________ veces al día.  Postura y mecánica corporal    La mecánica corporal se refiere a los movimientos y a las posiciones del cuerpo mientras realiza las actividades diarias. La postura es una parte de la mecánica corporal. La buena postura y la mecánica corporal saludable pueden ayudar a aliviar el estrés en las articulaciones y los tejidos del cuerpo. La buena postura significa que la columna mantiene su posición natural de curvatura en forma de S (la columna está en una posición neutral), los hombros van un poco hacia atrás y la cabeza no se inclina hacia adelante. A continuación, se incluyen pautas generales para mejorar la postura y la mecánica corporal en las actividades diarias.  De pie    · Al estar de pie, mantenga la columna en la posición neutral y los pies separados al ancho de caderas, aproximadamente. Mantenga las rodillas ligeramente flexionadas. Las orejas, los hombros y las caderas deben estar alineados.  · Cuando realice una tarea en la que deba estar de pie en el mismo sitio durante mucho tiempo, coloque un pie en un objeto estable de 2 a 4 pulgadas (5 a 10 cm) de alto, como un taburete. Esto ayuda a que la columna  mantenga una posición neutral.  Sentado    · Cuando esté sentado, mantenga la columna en posición neutral y deje los pies apoyados en el suelo. Use un apoyapiés, si es necesario, y mantenga los muslos paralelos al suelo. Evite redondear los hombros e inclinar la cabeza hacia adelante.  · Cuando trabaje en un escritorio o con una computadora, el escritorio debe estar a una altura en la que las manos estén un poco más abajo que los codos. Deslice la silla debajo del escritorio, de modo de estar lo suficientemente cerca como para mantener una buena postura.  · Cuando trabaje con una computadora, coloque el monitor a una altura que le permita mirar derecho hacia adelante, sin tener que inclinar la cabeza hacia adelante o hacia atrás.  Reposo    · Al descansar o estar acostado, evite las posiciones que le causen más dolor.  · Si siente dolor al hacer actividades que exigen sentarse, inclinarse, agacharse o ponerse en cuclillas (actividades basadas en la flexión), acuéstese en una posición en la que el cuerpo no deba doblarse mucho. Por ejemplo,   evite acurrucarse de costado con los brazos y las rodillas cerca del pecho (posición fetal).  · Si siente dolor con las actividades que exigen estar de pie durante mucho tiempo o estirar los brazos (actividades basadas en la extensión), acuéstese con la columna en una posición neutral y flexione ligeramente las rodillas. Pruebe con las siguientes posiciones:  ? Acostarse de costado con una almohada entre las rodillas.  ? Acostarse boca arriba con una almohada debajo de las rodillas.  Levantar objetos    · Cuando tenga que levantar un objeto, mantenga los pies separados el ancho de los hombros y apriete los músculos abdominales.  · Flexione las rodillas y la cadera, y mantenga la columna en posición neutral. Es importante levantarse utilizando la fuerza de las piernas, no de la espalda. No extienda completamente las rodillas.  · Siempre pida ayuda a otra persona para levantar  objetos pesados o incómodos.  Esta información no tiene como fin reemplazar el consejo del médico. Asegúrese de hacerle al médico cualquier pregunta que tenga.  Document Released: 03/12/2009 Document Revised: 02/11/2017 Document Reviewed: 12/08/2014  Elsevier Interactive Patient Education © 2019 [employer]

## 2018-09-21 ENCOUNTER — Telehealth: Payer: Self-pay | Admitting: Family Medicine

## 2018-09-21 DIAGNOSIS — M5441 Lumbago with sciatica, right side: Secondary | ICD-10-CM

## 2018-09-21 NOTE — Telephone Encounter (Signed)
Copied from [place] [id number]. Topic: General - Inquiry >> Sep 21, 2018  3:05 PM Virl Axe D wrote: Reason for CRM: Patient stated she was supposed to have an xray due to her neck and back pain but hasn't had it scheduled yet. Please advise. >> Sep 21, 2018  4:13 PM Stark Klein wrote: Is the pt suppose to go to outpatient imaging for this appt?

## 2018-09-21 NOTE — Telephone Encounter (Signed)
Called and spoke with patient, she states that today was her first day back at work, the medication does help but she cant take it while working, so she had a lot of pain today.   She would like to have a xray to see what is going on.

## 2018-09-21 NOTE — Telephone Encounter (Signed)
She was supposed to take her flexeril and naproxen and do her stretches and let me know if she is not getting better. She does not have orders in for x-rays

## 2018-09-27 NOTE — Telephone Encounter (Signed)
She can get her x-ray if she is still in pain. Order in.

## 2018-09-27 NOTE — Telephone Encounter (Signed)
Patient notified

## 2018-09-30 ENCOUNTER — Ambulatory Visit
Admission: RE | Admit: 2018-09-30 | Discharge: 2018-09-30 | Disposition: A | Discharge status: Home / Self Care | Payer: 59 | Source: Ambulatory Visit | Attending: Family Medicine | Admitting: Family Medicine

## 2018-09-30 ENCOUNTER — Other Ambulatory Visit: Payer: Self-pay

## 2018-09-30 DIAGNOSIS — M5441 Lumbago with sciatica, right side: Secondary | ICD-10-CM

## 2018-09-30 DIAGNOSIS — M545 Low back pain: Secondary | ICD-10-CM | POA: Diagnosis not present

## 2018-10-01 ENCOUNTER — Telehealth: Payer: Self-pay | Admitting: Family Medicine

## 2018-10-01 DIAGNOSIS — M5441 Lumbago with sciatica, right side: Secondary | ICD-10-CM

## 2018-10-01 NOTE — Telephone Encounter (Signed)
Called patient. Message relayed. Patient agreed to get a referral to see the PT.

## 2018-10-01 NOTE — Telephone Encounter (Signed)
Please let her know that her x-ray shows a muscle spasm. She should take her medicine,and if she'd like to get into PT, I'll be happy to set up a referral. Thanks!

## 2018-10-11 ENCOUNTER — Other Ambulatory Visit: Payer: Self-pay

## 2018-10-11 ENCOUNTER — Ambulatory Visit: Payer: 59 | Attending: Family Medicine | Admitting: Physical Therapy

## 2018-10-11 DIAGNOSIS — M542 Cervicalgia: Secondary | ICD-10-CM | POA: Insufficient documentation

## 2018-10-11 DIAGNOSIS — R293 Abnormal posture: Secondary | ICD-10-CM | POA: Insufficient documentation

## 2018-10-11 DIAGNOSIS — G8929 Other chronic pain: Secondary | ICD-10-CM | POA: Diagnosis not present

## 2018-10-11 DIAGNOSIS — R2689 Other abnormalities of gait and mobility: Secondary | ICD-10-CM | POA: Insufficient documentation

## 2018-10-11 DIAGNOSIS — M62838 Other muscle spasm: Secondary | ICD-10-CM | POA: Insufficient documentation

## 2018-10-11 DIAGNOSIS — M5441 Lumbago with sciatica, right side: Secondary | ICD-10-CM | POA: Insufficient documentation

## 2018-10-11 NOTE — Patient Instructions (Addendum)
6 directions of neck  Up/down Side to side Rotate   _______ Laying down:   press shoulders down and head down into bed 5 sec,  10 reps   Morning and night  ______   Angle wings  10 reps    _____   Avoid straining pelvic floor, abdominal muscles , spine  Use log rolling technique instead of getting out of bed with your neck or the sit-up     Log rolling into and out of bed   Log rolling into and out of bed If getting out of bed on R side, Bent knees, scoot hips/ shoulder to L  Raise R arm completely overhead, rolling onto armpit  Then lower bent knees to bed to get into complete side lying position  Then drop legs off bed, and push up onto R elbow/forearm, and use L hand to push onto the bed

## 2018-10-11 NOTE — Therapy (Addendum)
[place] MAIN [facility] SERVICES [address] [place], [place], [address] Phone: [phone]   Fax:  [phone]  Physical Therapy Evaluation  Patient Details  Name: Karen Walker. MRN: [id number] Date of Birth: 1971-01-08 Referring Provider (PT): Park Liter    Encounter Date: 10/11/2018  PT End of Session - 10/11/18 1559    Visit Number  1    Number of Visits  10    Date for PT Re-Evaluation  12/20/18    PT Start Time  1503    PT Stop Time  1559    PT Time Calculation (min)  56 min       Past Medical History:  Diagnosis Date  . Allergic rhinitis   . Anxiety   . Depression   . History of skin cancer   . Hx of migraine headaches    light sensivity, unilateral HA  . Urticaria     Past Surgical History:  Procedure Laterality Date  . ABDOMINAL HYSTERECTOMY  2014  . BREAST CYST ASPIRATION Right 2013   negative. Done at Dr. Dwyane Luo office  . HERNIA REPAIR  2014  . hysterectemy    . melonoma removal on R medial arch of foot  2008  . TOTAL ABDOMINAL HYSTERECTOMY W/ BILATERAL SALPINGOOPHORECTOMY    . UMBILICAL HERNIA REPAIR     occurred with hysterectemy    There were no vitals filed for this visit.   Subjective Assessment - 10/11/18 1525    Subjective 1) neck pain started 1 year ago without injury, came on suddenly.  Pt points at the C/T joint, and it radiates to ear. It occurs mostly at the end of the day. Heat helps temporarily. Pain varies from day to day 8-10/10 and takes Naproxen amd Cyclobezaprine. Pt can go to sleep but she is wokenup at night by the pain. Changing positions do not help with the pain. Radiates along outside of arm to pinky side.    2) LBP started one year ago a bit after her neck pain. It starts in the middle of spine to R  to hip bone. Sometimes radiating to pinky toe with bending.  Picking up heavy things at work hurts both neck and back.     Pertinent History  surgery for CA melanoma on medial arch R  LE 2008, hysterectemy, and  umbilical hernia repair 7903         [facility] PT Assessment - 10/11/18 1525      Assessment   Medical Diagnosis  acute R sided back pain with sciatic      Referring Provider (PT)  Park Liter       Precautions   Precautions  None      Restrictions   Weight Bearing Restrictions  No      Balance Screen   Has the patient fallen in the past 6 months  Yes    How many times?  1    Has the patient had a decrease in activity level because of a fear of falling?   No    Is the patient reluctant to leave their home because of a fear of falling?   No      Other:   Other/ Comments  cervical endurance test: 29 sec before fatigue       Other:   Other/Comments  reaching hand behind back and neck R +       Posture/Postural Control   Posture Comments  forward head 19 cm earlobe to  acromium  Lumbar lordosis,        AROM   Overall AROM Comments  cerv rotation R 25 deg, L 35 deg,  cerv L flexion 40 deg, (+ on R) , 35 deg L sideflexion (+).  ext 50 deg, flex 60 deg    post Tx: rotation 45 deg B (-), sideflexion 40 deg B ( -)   AROM spinal WFL      Strength   Overall Strength Comments  shoulder MMT R with pain but strength deficits: shoulder ER/IR 3-/5 L 4+/5        Palpation   Spinal mobility  T1-3 R interspinal tightness, hypomobility ( decreased Tx)                 Objective measurements completed on examination: See above findings.      [place] Adult PT Treatment/Exercise - 10/11/18 1525      Moist Heat Therapy   Number Minutes Moist Heat  5 Minutes    Moist Heat Location  --   cervical     Manual Therapy   Manual therapy comments  distraction at C/T, MWM/ STM along R lateral aspect of segments               PT Long Term Goals - 10/11/18 1602      PT LONG TERM GOAL #1   Title  cervical endurance test: 29 sec before fatigue increase to > 1.5  min before fatige in order to restore neck/ shoulder mobility and less pain by the end  of work day    Time  10    Period  Weeks    Status  New    Target Date  12/20/18      PT LONG TERM GOAL #2   Title  Pt will decrease QUICK DASH from  55% to 47% ino rder to return to ADLs and work ( MCID 8%)    Time  10    Period  Weeks    Status  New    Target Date  12/20/18      PT LONG TERM GOAL #3   Title  Pt will increase distance from earlobe to acromium from 19 cm to > 20 cm and  demo decrease R lateral tightness along T1-3  in order to minimize forward head posture    Time  4    Period  Weeks    Status  New    Target Date  11/08/18      PT LONG TERM GOAL #4   Title  Pt will decrease her QUICK DASH Work MOdule score from 16 pts to < 10 pts in order to perform work duties with lifting    Time  8    Period  Weeks    Status  New    Target Date  12/06/18      PT LONG TERM GOAL #5   Title  Pt will report decreased 50% from 8/10-10/10 low back pain and less frequency of radiating pain down to R toe by 50% in order to sit, stand, walk    Time  5    Period  Weeks    Status  New    Target Date  11/15/18      Additional Long Term Goals   Additional Long Term Goals  Yes             Plan - 10/11/18 1559    Clinical Impression Statement Pt is a 48 yo female  who reports radiating neck and low back pain for the past year without injury and with sudden onset. 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 These deficits impact her ability to reach behind her back with bathing and lifting. Pt 's clinical presentations today showed limited cervical ROM, decreased cervical endurance,  increased tightness along R neck mm, hypomobility at T1-3, pain with resistance at R shoulder, forward head position, increased lumbar lordosis. Pt has had Hx of hysterectomy which can indicate weakness of deep core mm. Plan to assess low back / pelvis next session. Following Tx, pt showed increased mobility at T1-3 with cervical rotation/ side flexion without pain. Pt demo'd improved  scapular depression and cervical retraction following Tx. Pt will benefit from skilled PT.     Examination-Activity Limitations  Hygiene/Grooming;Reach Overhead;Bend    Rehab Potential  Good    PT Frequency  1x / week    PT Duration  --   10   PT Treatment/Interventions  ADLs/Self Care Home Management;Moist Heat;Therapeutic activities;Patient/family education;Scar mobilization;Aquatic Therapy;Therapeutic exercise;Passive range of motion;Balance training;Manual techniques;Dry needling;Cryotherapy;Stair training;Neuromuscular re-education;Energy conservation;Functional mobility training    Consulted and Agree with Plan of Care  Patient       Patient will benefit from skilled therapeutic intervention in order to improve the following deficits and impairments:  Impaired flexibility, Improper body mechanics, Decreased range of motion, Pain, Decreased activity tolerance, Increased fascial restricitons, Increased muscle spasms, Decreased scar mobility, Decreased endurance, Decreased safety awareness, Decreased mobility, Decreased strength, Decreased knowledge of precautions, Decreased coordination  Visit Diagnosis: 1. Abnormal posture   2. Neck pain   3. Chronic right-sided low back pain with right-sided sciatica   4. Other abnormalities of gait and mobility        Problem List Patient Active Problem List   Diagnosis Date Noted  . Suspected Covid-19 Virus Infection 08/27/2018  . Vaginal atrophy 06/24/2017  . Multiple allergies 01/13/2017  . Skin cancer 01/13/2017  . Major depression, recurrent ([place]) 12/26/2016  . Swelling of limb 12/12/2016  . Varicose veins of leg with pain, bilateral 12/12/2016  . Breast lump on right side at 8 o'clock position 12/06/2014  . Contact dermatitis 12/06/2014  . Allergic rhinitis   . History of skin cancer     [name] ,PT, DPT, E-RYT  10/11/2018, 5:56 PM  [place] MAIN [facility] SERVICES [address]  [place], [place], [address] Phone: [phone]   Fax:  [phone]  Name: Karen Walker. MRN: [id number] Date of Birth: 08/12/1970

## 2018-10-19 ENCOUNTER — Ambulatory Visit: Payer: 59 | Admitting: Physical Therapy

## 2018-10-20 ENCOUNTER — Ambulatory Visit: Payer: 59 | Admitting: Physical Therapy

## 2018-10-25 ENCOUNTER — Ambulatory Visit: Payer: 59 | Admitting: Physical Therapy

## 2018-10-25 ENCOUNTER — Other Ambulatory Visit: Payer: Self-pay

## 2018-10-25 DIAGNOSIS — R293 Abnormal posture: Secondary | ICD-10-CM | POA: Diagnosis not present

## 2018-10-25 DIAGNOSIS — M5441 Lumbago with sciatica, right side: Secondary | ICD-10-CM | POA: Diagnosis not present

## 2018-10-25 DIAGNOSIS — M542 Cervicalgia: Secondary | ICD-10-CM | POA: Diagnosis not present

## 2018-10-25 DIAGNOSIS — G8929 Other chronic pain: Secondary | ICD-10-CM

## 2018-10-25 DIAGNOSIS — R2689 Other abnormalities of gait and mobility: Secondary | ICD-10-CM | POA: Diagnosis not present

## 2018-10-25 DIAGNOSIS — M62838 Other muscle spasm: Secondary | ICD-10-CM | POA: Diagnosis not present

## 2018-10-25 NOTE — Patient Instructions (Signed)
Upper neck muscles:    1) sidelying: Pull _ arm overhead over mattress, grab the edge of mattress,pull it upward, drawing elbow away from ears  Breathing, tuck chin, turn head like looking over shoulders   2) on back: Pull R shoulder down,  R Ear to shoulder, Turn look upper L     ___   3)  On hands and knees:  childs pose rocking  childs pose 3- way   ___  4)   Looser bra ( buy bra extension )  Breathing  1-2 pause to expand ribs not chest  Exhale 2-1 pause    ___  Seated posture Feet on the ground  chin tuck  no slouching   ____  Change shoes to sneakers no more wearing [employer]

## 2018-10-26 NOTE — Therapy (Addendum)
[place] MAIN [facility] SERVICES [address] [place], [place], [address] Phone: [phone]   Fax:  [phone]  Physical Therapy Treatment  Patient Details  Name: Karen Walker. MRN: [id number] Date of Birth: Dec 28, 1970 Referring Provider (PT): Park Liter    Encounter Date: 10/25/2018  PT End of Session - 10/26/18 1321    Visit Number  2    Number of Visits  10    Date for PT Re-Evaluation  12/20/18    PT Start Time  3818    PT Stop Time  1710    PT Time Calculation (min)  63 min    Activity Tolerance  Patient tolerated treatment well;No increased pain    Behavior During Therapy  WFL for tasks assessed/performed       Past Medical History:  Diagnosis Date  . Allergic rhinitis   . Anxiety   . Depression   . History of skin cancer   . Hx of migraine headaches    light sensivity, unilateral HA  . Urticaria     Past Surgical History:  Procedure Laterality Date  . ABDOMINAL HYSTERECTOMY  2014  . BREAST CYST ASPIRATION Right 2013   negative. Done at Dr. Dwyane Luo office  . HERNIA REPAIR  2014  . hysterectemy    . melonoma removal on R medial arch of foot  2008  . TOTAL ABDOMINAL HYSTERECTOMY W/ BILATERAL SALPINGOOPHORECTOMY    . UMBILICAL HERNIA REPAIR     occurred with hysterectemy    There were no vitals filed for this visit.  Subjective Assessment - 10/25/18 1616    Subjective  Pt reports her n eck /shoulder pain was 30% better. Radiating pain decreased.    Pertinent History  surgery for CA melanoma on medial arch R LE 2008, hysterectemy, and  umbilical hernia repair 2993         [facility] PT Assessment - 10/26/18 1321      Observation/Other Assessments   Observations  slouched position, ankles crossed pre and post Tx. Required cues for feet on the floor, cerv retraction      Coordination   Gross Motor Movements are Fluid and Coordinated  --   moderate cues for diaphragmatic breathing, less chest breath     AROM    Overall AROM Comments  cerv sideflexion L 30 deg, R 35 deg, rotation 35 deg B  ( post Tx: rotation 50deg B, sideflexion 45 deg B )        Palpation   Palpation comment  increased tightness at thoracic paraspinal/  serratus anterior/ SCM B  ( decreased post Tx)                    [facility] Adult PT Treatment/Exercise - 10/26/18 1326      Neuro Re-ed    Neuro Re-ed Details   cues fo r proper deepc ore breathing, proper technique for new HEP stretches      Exercises   Other Exercises   see pt instructions, stretches to lengthen SCM, medial scapular mm tightness, serratus anterior B  (paraspinal)       Moist Heat Therapy   Number Minutes Moist Heat  5 Minutes    Moist Heat Location  --   neck during instruction of new HEP     Manual Therapy   Manual therapy comments  STM, MWM  SCM, medial scapular mm tightness, serratus anterior B  (paraspinal) , inferior mob at scap spine to lengthen cervical mm  B              PT Long Term Goals - 10/11/18 1602      PT LONG TERM GOAL #1   Title  cervical endurance test: 29 sec before fatigue increase to > 1.5  min before fatige in order to restore neck/ shoulder mobility and less pain by the end of work day    Time  10    Period  Weeks    Status  New    Target Date  12/20/18      PT LONG TERM GOAL #2   Title  Pt will decrease QUICK DASH from  55% to 47% ino rder to return to ADLs and work ( [employer] 8%)    Time  10    Period  Weeks    Status  New    Target Date  12/20/18      PT LONG TERM GOAL #3   Title  Pt will increase distance from earlobe to acromium from 19 cm to > 20 cm and  demo decrease R lateral tightness along T1-3  in order to minimize forward head posture    Time  4    Period  Weeks    Status  New    Target Date  11/08/18      PT LONG TERM GOAL #4   Title  Pt will decrease her QUICK DASH Work MOdule score from 16 pts to < 10 pts in order to perform work duties with lifting    Time  8    Period  Weeks    Status   New    Target Date  12/06/18      PT LONG TERM GOAL #5   Title  Pt will report decreased 50% from 8/10-10/10 low back pain and less frequency of radiating pain down to R toe by 50% in order to sit, stand, walk    Time  5    Period  Weeks    Status  New    Target Date  11/15/18      Additional Long Term Goals   Additional Long Term Goals  Yes            Plan - 10/26/18 1332    Clinical Impression Statement Pt is making progress since last session as pt shows good carry over with increased cervical mobility and reports of decreased radicular pain down her shoulder/ arm.  However, today, pt required more cues for proper sitting posture to minimize forward head / slouched posture. Pt required further manual Tx to increase cervical mobility further which further increased her cervical mobility. Initiated deep core coordination ( Level 1) and plan to progress at next session to address ab scar adhesions and advance to deep core/ thoracolumbar  strengthening to promote postural stability. Pt continues to benefit from skilled PT.   While plan is to improve her posture, postural stability, and minimize overuse of mm with her repeated tasks at work, one concern to communicate to her PCP is that she has complained of her whole body achiness that wakes her up in the middle of the night. Pt has a Hx of skin CA. DPT will communicate with PCP about this concern as pt is due for an annual check up this week.        Examination-Activity Limitations  Hygiene/Grooming;Reach Overhead;Bend    Rehab Potential  Good    PT Frequency  1x / week    PT Duration  --  10   PT Treatment/Interventions  ADLs/Self Care Home Management;Moist Heat;Therapeutic activities;Patient/family education;Scar mobilization;Aquatic Therapy;Therapeutic exercise;Passive range of motion;Balance training;Manual techniques;Dry needling;Cryotherapy;Stair training;Neuromuscular re-education;Energy conservation;Functional mobility training     Consulted and Agree with Plan of Care  Patient       Patient will benefit from skilled therapeutic intervention in order to improve the following deficits and impairments:  Impaired flexibility, Improper body mechanics, Decreased range of motion, Pain, Decreased activity tolerance, Increased fascial restricitons, Increased muscle spasms, Decreased scar mobility, Decreased endurance, Decreased safety awareness, Decreased mobility, Decreased strength, Decreased knowledge of precautions, Decreased coordination  Visit Diagnosis: 1. Neck pain   2. Chronic right-sided low back pain with right-sided sciatica   3. Other abnormalities of gait and mobility   4. Abnormal posture        Problem List Patient Active Problem List   Diagnosis Date Noted  . Suspected Covid-19 Virus Infection 08/27/2018  . Vaginal atrophy 06/24/2017  . Multiple allergies 01/13/2017  . Skin cancer 01/13/2017  . Major depression, recurrent ([place]) 12/26/2016  . Swelling of limb 12/12/2016  . Varicose veins of leg with pain, bilateral 12/12/2016  . Breast lump on right side at 8 o'clock position 12/06/2014  . Contact dermatitis 12/06/2014  . Allergic rhinitis   . History of skin cancer     [name] ,PT, DPT, E-RYT  10/26/2018, 1:51 PM  [place] MAIN [facility] SERVICES [address] [place], [place], [address] Phone: [phone]   Fax:  [phone]  Name: Karen Walker. MRN: [id number] Date of Birth: 04/18/1970

## 2018-11-01 ENCOUNTER — Ambulatory Visit: Payer: 59 | Admitting: Physical Therapy

## 2018-11-01 ENCOUNTER — Other Ambulatory Visit: Payer: Self-pay

## 2018-11-01 DIAGNOSIS — R2689 Other abnormalities of gait and mobility: Secondary | ICD-10-CM

## 2018-11-01 DIAGNOSIS — M5441 Lumbago with sciatica, right side: Secondary | ICD-10-CM | POA: Diagnosis not present

## 2018-11-01 DIAGNOSIS — M62838 Other muscle spasm: Secondary | ICD-10-CM | POA: Diagnosis not present

## 2018-11-01 DIAGNOSIS — G8929 Other chronic pain: Secondary | ICD-10-CM

## 2018-11-01 DIAGNOSIS — R293 Abnormal posture: Secondary | ICD-10-CM

## 2018-11-01 DIAGNOSIS — M542 Cervicalgia: Secondary | ICD-10-CM | POA: Diagnosis not present

## 2018-11-01 NOTE — Therapy (Signed)
[place] MAIN [facility] SERVICES [address] [place], [place], [address] Phone: [phone]   Fax:  [phone]  Physical Therapy Treatment  Patient Details  Name: Karen Walker. MRN: [id number] Date of Birth: 1971/03/13 Referring Provider (PT): Park Liter    Encounter Date: 11/01/2018  PT End of Session - 11/01/18 1814    Visit Number  3    Number of Visits  10    Date for PT Re-Evaluation  12/20/18    PT Start Time  2725    PT Stop Time  3664    PT Time Calculation (min)  41 min    Activity Tolerance  Patient tolerated treatment well;No increased pain    Behavior During Therapy  WFL for tasks assessed/performed       Past Medical History:  Diagnosis Date  . Allergic rhinitis   . Anxiety   . Depression   . History of skin cancer   . Hx of migraine headaches    light sensivity, unilateral HA  . Urticaria     Past Surgical History:  Procedure Laterality Date  . ABDOMINAL HYSTERECTOMY  2014  . BREAST CYST ASPIRATION Right 2013   negative. Done at Dr. Dwyane Luo office  . HERNIA REPAIR  2014  . hysterectemy    . melonoma removal on R medial arch of foot  2008  . TOTAL ABDOMINAL HYSTERECTOMY W/ BILATERAL SALPINGOOPHORECTOMY    . UMBILICAL HERNIA REPAIR     occurred with hysterectemy    There were no vitals filed for this visit.  Subjective Assessment - 11/01/18 1609    Subjective  This past week, pain is waking her up less and not every night. Last night, pt slept more than most nights and woke up only once and was able to fall back asleep. The tingling down both legs came back this week. Pt was resting alot. R leg pain goes down the R lateral side to the heel, L down the calf, on days she works and on days she does not work. Sometimes there is swelling in both feet. Pt has had a shot for R heel pain but the pain came back.    Pertinent History  surgery for CA melanoma on medial arch R LE 2008, hysterectemy, and  umbilical  hernia repair 2014         [facility] PT Assessment - 11/01/18 1618      Coordination   Coordination and Movement Description  poor propioception of cervical spinal with throacic rotation ( poor distinction from rotation/ side flexion       Palpation   Spinal mobility  WFL radiating pain R leg with R sideflexion/ rotation     SI assessment   R ASIS more anterior     Palpation comment  interspinal mm tightness on R > L from thoracic/ lumbar ( improved post Tx)       Dermatome: decreased along L5/S1 on R compared to L ( post Tx: increased)              [facility] Adult PT Treatment/Exercise - 11/01/18 1636      Neuro Re-ed    Neuro Re-ed Details   cued tactile/ verbal cues for cervical retraction/ rotation in trunk rotation       Exercises   Other Exercises   cued for alignment and technique in open book and reclined twist       Moist Heat Therapy   Number Minutes Moist Heat  5  Minutes    Moist Heat Location  --   trunk, in legs elevated position     Manual Therapy   Soft tissue mobilization  MWM along R interspinal mm     Other Manual Therapy  AP mob at R SIJ in hip flexion,  hip abd/ER               PT Long Term Goals - 10/11/18 1602      PT LONG TERM GOAL #1   Title  cervical endurance test: 29 sec before fatigue increase to > 1.5  min before fatige in order to restore neck/ shoulder mobility and less pain by the end of work day    Time  10    Period  Weeks    Status  New    Target Date  12/20/18      PT LONG TERM GOAL #2   Title  Pt will decrease QUICK DASH from  55% to 47% ino rder to return to ADLs and work ( [employer] 8%)    Time  10    Period  Weeks    Status  New    Target Date  12/20/18      PT LONG TERM GOAL #3   Title  Pt will increase distance from earlobe to acromium from 19 cm to > 20 cm and  demo decrease R lateral tightness along T1-3  in order to minimize forward head posture    Time  4    Period  Weeks    Status  New    Target Date   11/08/18      PT LONG TERM GOAL #4   Title  Pt will decrease her QUICK DASH Work MOdule score from 16 pts to < 10 pts in order to perform work duties with lifting    Time  8    Period  Weeks    Status  New    Target Date  12/06/18      PT LONG TERM GOAL #5   Title  Pt will report decreased 50% from 8/10-10/10 low back pain and less frequency of radiating pain down to R toe by 50% in order to sit, stand, walk    Time  5    Period  Weeks    Status  New    Target Date  11/15/18      Additional Long Term Goals   Additional Long Term Goals  Yes            Plan - 11/01/18 1814    Clinical Impression Statement  Pt is responding well to manual Tx and HEP as pt reports she is sleeping better this past week with significantly less interruption of pain in the middle of the night and is able to fall back asleep.  Pt demo'd decreased neck/ shoulder tightness today and less forward head posture. Focused on radicular CLBP with manual Tx which realigned pelvic girdle and decreasing R interspinal mm tightness. Pt tolerated without pain and reported centralizing of pain post Tx. Pt continues to benefit from skilled PT.     Examination-Activity Limitations  Hygiene/Grooming;Reach Overhead;Bend    Rehab Potential  Good    PT Frequency  1x / week    PT Duration  --   10   PT Treatment/Interventions  ADLs/Self Care Home Management;Moist Heat;Therapeutic activities;Patient/family education;Scar mobilization;Aquatic Therapy;Therapeutic exercise;Passive range of motion;Balance training;Manual techniques;Dry needling;Cryotherapy;Stair training;Neuromuscular re-education;Energy conservation;Functional mobility training    Consulted and Agree with Plan of  Care  Patient       Patient will benefit from skilled therapeutic intervention in order to improve the following deficits and impairments:  Impaired flexibility, Improper body mechanics, Decreased range of motion, Pain, Decreased activity tolerance,  Increased fascial restricitons, Increased muscle spasms, Decreased scar mobility, Decreased endurance, Decreased safety awareness, Decreased mobility, Decreased strength, Decreased knowledge of precautions, Decreased coordination  Visit Diagnosis: 1. Neck pain   2. Chronic right-sided low back pain with right-sided sciatica   3. Other abnormalities of gait and mobility   4. Abnormal posture        Problem List Patient Active Problem List   Diagnosis Date Noted  . Suspected Covid-19 Virus Infection 08/27/2018  . Vaginal atrophy 06/24/2017  . Multiple allergies 01/13/2017  . Skin cancer 01/13/2017  . Major depression, recurrent ([place]) 12/26/2016  . Swelling of limb 12/12/2016  . Varicose veins of leg with pain, bilateral 12/12/2016  . Breast lump on right side at 8 o'clock position 12/06/2014  . Contact dermatitis 12/06/2014  . Allergic rhinitis   . History of skin cancer     [name] ,PT, DPT, E-RYT  11/01/2018, 6:16 PM  [place] MAIN [facility] SERVICES [address] [place], [place], [address] Phone: [phone]   Fax:  [phone]  Name: Erie Sica. MRN: [id number] Date of Birth: 1971/03/26

## 2018-11-01 NOTE — Addendum Note (Signed)
Addended by: [name] on: 11/01/2018 06:26 PM   Modules accepted: Orders

## 2018-11-01 NOTE — Patient Instructions (Signed)
  1) Open book  ( hand out) for midback   2) Reclined twist for low back   -  Side of hip stretch:  Reclined twist for hips and side of the hips/ legs  Lay on your back, knees bend Scoot hips to the R , leave shoulders in place Drop knees to the L side resting onto pillows to keep leg at the same width of hips Pillow under L thigh to minimize too much strain

## 2018-11-08 ENCOUNTER — Other Ambulatory Visit: Payer: Self-pay

## 2018-11-08 ENCOUNTER — Ambulatory Visit: Payer: 59 | Attending: Family Medicine | Admitting: Physical Therapy

## 2018-11-08 DIAGNOSIS — M62838 Other muscle spasm: Secondary | ICD-10-CM | POA: Insufficient documentation

## 2018-11-08 DIAGNOSIS — R2689 Other abnormalities of gait and mobility: Secondary | ICD-10-CM | POA: Diagnosis not present

## 2018-11-08 DIAGNOSIS — M545 Low back pain: Secondary | ICD-10-CM | POA: Diagnosis not present

## 2018-11-08 DIAGNOSIS — M5441 Lumbago with sciatica, right side: Secondary | ICD-10-CM | POA: Diagnosis not present

## 2018-11-08 DIAGNOSIS — M542 Cervicalgia: Secondary | ICD-10-CM | POA: Insufficient documentation

## 2018-11-08 DIAGNOSIS — R293 Abnormal posture: Secondary | ICD-10-CM | POA: Insufficient documentation

## 2018-11-08 DIAGNOSIS — G8929 Other chronic pain: Secondary | ICD-10-CM | POA: Insufficient documentation

## 2018-11-08 NOTE — Patient Instructions (Signed)
Pelvic tilt   Deep core level 2, pay attention to lengthening spine, decrease arch in back to decrease radiating pain

## 2018-11-08 NOTE — Therapy (Signed)
[place] MAIN [facility] SERVICES [address] [place], [place], [address] Phone: [phone]   Fax:  [phone]  Physical Therapy Treatment  Patient Details  Name: Karen Walker. MRN: [id number] Date of Birth: 12-05-1970 Referring Provider (PT): Park Liter    Encounter Date: 11/08/2018  PT End of Session - 11/08/18 2207    Visit Number  4    Number of Visits  10    Date for PT Re-Evaluation  12/20/18    PT Start Time  5397    PT Stop Time  1700    PT Time Calculation (min)  90 min    Activity Tolerance  Patient tolerated treatment well;No increased pain    Behavior During Therapy  WFL for tasks assessed/performed       Past Medical History:  Diagnosis Date  . Allergic rhinitis   . Anxiety   . Depression   . History of skin cancer   . Hx of migraine headaches    light sensivity, unilateral HA  . Urticaria     Past Surgical History:  Procedure Laterality Date  . ABDOMINAL HYSTERECTOMY  2014  . BREAST CYST ASPIRATION Right 2013   negative. Done at Dr. Dwyane Luo office  . HERNIA REPAIR  2014  . hysterectemy    . melonoma removal on R medial arch of foot  2008  . TOTAL ABDOMINAL HYSTERECTOMY W/ BILATERAL SALPINGOOPHORECTOMY    . UMBILICAL HERNIA REPAIR     occurred with hysterectemy    There were no vitals filed for this visit.  Subjective Assessment - 11/08/18 1537    Subjective  After last session, pt felt better in her back for 4-5 days.  Pt felt the radiating pain down R leg again. It comes and goes. Pain 7/10    Pertinent History  surgery for CA melanoma on medial arch R LE 2008, hysterectemy, and  umbilical hernia repair 6734         [facility] PT Assessment - 11/08/18 2207      Observation/Other Assessments   Observations  radiating pain with excessive lumbar lordosis with shoulder flexion       Coordination   Gross Motor Movements are Fluid and Coordinated  --   poor propioception for pelvic tilt     Palpation    SI assessment   R coccygeus, pirformis tight/ tender, R SIJ hypomobile lacking nutation     Palpation comment  increased scar restriction at low abdomen, referred pain to low back                    [facility] Adult PT Treatment/Exercise - 11/08/18 2226      Neuro Re-ed    Neuro Re-ed Details   cued for propioception for feet co-activation with  pelvic tilt, minimizing lumbar lordosis        Manual Therapy   Soft tissue mobilization  fascial release with MWM     Other Manual Therapy  PA mob , superior glide at R sacrum to facilitate nutation and decrease mm tightness              PT Long Term Goals - 10/11/18 1602      PT LONG TERM GOAL #1   Title  cervical endurance test: 29 sec before fatigue increase to > 1.5  min before fatige in order to restore neck/ shoulder mobility and less pain by the end of work day    Time  10  Period  Weeks    Status  New    Target Date  12/20/18      PT LONG TERM GOAL #2   Title  Pt will decrease QUICK DASH from  55% to 47% ino rder to return to ADLs and work ( [employer] 8%)    Time  10    Period  Weeks    Status  New    Target Date  12/20/18      PT LONG TERM GOAL #3   Title  Pt will increase distance from earlobe to acromium from 19 cm to > 20 cm and  demo decrease R lateral tightness along T1-3  in order to minimize forward head posture    Time  4    Period  Weeks    Status  New    Target Date  11/08/18      PT LONG TERM GOAL #4   Title  Pt will decrease her QUICK DASH Work MOdule score from 16 pts to < 10 pts in order to perform work duties with lifting    Time  8    Period  Weeks    Status  New    Target Date  12/06/18      PT LONG TERM GOAL #5   Title  Pt will report decreased 50% from 8/10-10/10 low back pain and less frequency of radiating pain down to R toe by 50% in order to sit, stand, walk    Time  5    Period  Weeks    Status  New    Target Date  11/15/18      Additional Long Term Goals   Additional Long  Term Goals  Yes            Plan - 11/08/18 2251    Clinical Impression Statement Pt had short lasting benefits from last session.  Pt 's radiating pain was reproduced today with fascial releases over abdomen scar with anterior tilt of pelvic and shoulder flexion which were both associated with increased lumbar lordosis. Radiating pain was decreased with propioception training, position modification to decrease lumbar lordosis. Session was extended further as pt reported return of radiating pain after scar release with sit to stand. Further assessment showed radiating pain was directly associated with piriformis tightness and lack of R sacral nutation.Pt tolerated manual Tx without complaints and reported decreased radiating pain down R LE post Tx. Pt continues to benefit from skilled PT.    Examination-Activity Limitations  Hygiene/Grooming;Reach Overhead;Bend    Rehab Potential  Good    PT Frequency  1x / week    PT Duration  --   10   PT Treatment/Interventions  ADLs/Self Care Home Management;Moist Heat;Therapeutic activities;Patient/family education;Scar mobilization;Aquatic Therapy;Therapeutic exercise;Passive range of motion;Balance training;Manual techniques;Dry needling;Cryotherapy;Stair training;Neuromuscular re-education;Energy conservation;Functional mobility training    Consulted and Agree with Plan of Care  Patient       Patient will benefit from skilled therapeutic intervention in order to improve the following deficits and impairments:  Impaired flexibility, Improper body mechanics, Decreased range of motion, Pain, Decreased activity tolerance, Increased fascial restricitons, Increased muscle spasms, Decreased scar mobility, Decreased endurance, Decreased safety awareness, Decreased mobility, Decreased strength, Decreased knowledge of precautions, Decreased coordination  Visit Diagnosis: 1. Other muscle spasm        Problem List Patient Active Problem List   Diagnosis  Date Noted  . Suspected Covid-19 Virus Infection 08/27/2018  . Vaginal atrophy 06/24/2017  . Multiple allergies 01/13/2017  .  Skin cancer 01/13/2017  . Major depression, recurrent ([place]) 12/26/2016  . Swelling of limb 12/12/2016  . Varicose veins of leg with pain, bilateral 12/12/2016  . Breast lump on right side at 8 o'clock position 12/06/2014  . Contact dermatitis 12/06/2014  . Allergic rhinitis   . History of skin cancer     [name] ,PT, DPT, E-RYT  11/08/2018, 10:51 PM  [place] MAIN [facility] SERVICES [address] [place], [place], [address] Phone: [phone]   Fax:  [phone]  Name: Karen Walker. MRN: [id number] Date of Birth: 10-25-1970

## 2018-11-12 ENCOUNTER — Encounter: Payer: Self-pay | Admitting: Family Medicine

## 2018-11-12 ENCOUNTER — Other Ambulatory Visit: Payer: Self-pay

## 2018-11-12 ENCOUNTER — Ambulatory Visit (INDEPENDENT_AMBULATORY_CARE_PROVIDER_SITE_OTHER): Payer: 59 | Admitting: Family Medicine

## 2018-11-12 VITALS — BP 104/72 | HR 72 | Temp 97.7°F | Ht 64.0 in | Wt 153.0 lb

## 2018-11-12 DIAGNOSIS — F331 Major depressive disorder, recurrent, moderate: Secondary | ICD-10-CM | POA: Diagnosis not present

## 2018-11-12 DIAGNOSIS — Z0000 Encounter for general adult medical examination without abnormal findings: Secondary | ICD-10-CM

## 2018-11-12 DIAGNOSIS — I83813 Varicose veins of bilateral lower extremities with pain: Secondary | ICD-10-CM | POA: Diagnosis not present

## 2018-11-12 DIAGNOSIS — J309 Allergic rhinitis, unspecified: Secondary | ICD-10-CM | POA: Diagnosis not present

## 2018-11-12 LAB — UA/M W/RFLX CULTURE, ROUTINE
Bilirubin, UA: NEGATIVE
Glucose, UA: NEGATIVE
Ketones, UA: NEGATIVE
Leukocytes,UA: NEGATIVE
Nitrite, UA: NEGATIVE
Protein,UA: NEGATIVE
Specific Gravity, UA: 1.01 (ref 1.005–1.030)
Urobilinogen, Ur: 0.2 mg/dL (ref 0.2–1.0)
pH, UA: 6 (ref 5.0–7.5)

## 2018-11-12 LAB — MICROSCOPIC EXAMINATION
Bacteria, UA: NONE SEEN
WBC, UA: NONE SEEN /hpf (ref 0–5)

## 2018-11-12 MED ORDER — ESCITALOPRAM OXALATE 5 MG PO TABS: 5.0000 mg | ORAL_TABLET | Freq: Every day | ORAL | 3 refills | Status: DC

## 2018-11-12 NOTE — Assessment & Plan Note (Signed)
Starting to hurt again. Would like to see vascular. New referral generated.

## 2018-11-12 NOTE — Progress Notes (Signed)
BP 104/72   Pulse 72   Temp 97.7 F (36.5 C) (Oral)   Ht 5\' 4"  (1.626 m)   Wt 153 lb (69.4 kg)   SpO2 99%   BMI 26.26 kg/m    Subjective:    Patient ID: Karen Walker., female    DOB: Sep 11, 1970, 48 y.o.   MRN: [id number]  HPI: Karen Walker is a 47 y.o. female presenting on 11/12/2018 for comprehensive medical examination. Current medical complaints include:  DEPRESSION- very worried about her daughter, she separated from her husband and she is very worried about her Mood status: uncontrolled Satisfied with current treatment?: no Symptom severity: moderate  Duration of current treatment : taking lexapro occasionally Side effects: no Medication compliance: poor compliance Psychotherapy/counseling: no  Previous psychiatric medications: lexapro Depressed mood: yes Anxious mood: yes Anhedonia: yes Significant weight loss or gain: no Insomnia: yes hard to fall asleep Fatigue: yes Feelings of worthlessness or guilt: yes Impaired concentration/indecisiveness: yes Suicidal ideations: no Hopelessness: yes Crying spells: yes Depression screen [facility] 2/9 11/12/2018 02/12/2018 01/12/2018 01/26/2017 12/26/2016  Decreased Interest 2 1 1 2 3   Down, Depressed, Hopeless 2 1 1 2 3   PHQ - 2 Score 4 2 2 4 6   Altered sleeping 3 3 3 2 1   Tired, decreased energy 3 3 2 2 2   Change in appetite 3 3 1 2 3   Feeling bad or failure about yourself  3 3 - 3 3  Trouble concentrating 3 3 0 2 1  Moving slowly or fidgety/restless 1 3 0 2 2  Suicidal thoughts 0 1 3 1 1   PHQ-9 Score 20 21 11 18 19   Difficult doing work/chores Very difficult Very difficult - Very difficult Very difficult   GAD 7 : Generalized Anxiety Score 11/12/2018 01/12/2018  Nervous, Anxious, on Edge 2 1  Control/stop worrying 1 3  Worry too much - different things 1 3  Trouble relaxing 1 3  Restless 1 2  Easily annoyed or irritable 1 3  Afraid - awful might happen 1 3  Total GAD 7 Score 8 18  Anxiety Difficulty Not difficult at all  -     She currently lives with: husband and kids Menopausal Symptoms: no  Depression Screen done today and results listed below:  Depression screen [facility] 2/9 11/12/2018 02/12/2018 01/12/2018 01/26/2017 12/26/2016  Decreased Interest 2 1 1 2 3   Down, Depressed, Hopeless 2 1 1 2 3   PHQ - 2 Score 4 2 2 4 6   Altered sleeping 3 3 3 2 1   Tired, decreased energy 3 3 2 2 2   Change in appetite 3 3 1 2 3   Feeling bad or failure about yourself  3 3 - 3 3  Trouble concentrating 3 3 0 2 1  Moving slowly or fidgety/restless 1 3 0 2 2  Suicidal thoughts 0 1 3 1 1   PHQ-9 Score 20 21 11 18 19   Difficult doing work/chores Very difficult Very difficult - Very difficult Very difficult    Past Medical History:  Past Medical History:  Diagnosis Date  . Allergic rhinitis   . Anxiety   . Depression   . History of skin cancer   . Hx of migraine headaches    light sensivity, unilateral HA  . Urticaria     Surgical History:  Past Surgical History:  Procedure Laterality Date  . ABDOMINAL HYSTERECTOMY  2014  . BREAST CYST ASPIRATION Right 2013   negative. Done at Dr. Dwyane Luo office  .  HERNIA REPAIR  2014  . hysterectemy    . melonoma removal on R medial arch of foot  2008  . TOTAL ABDOMINAL HYSTERECTOMY W/ BILATERAL SALPINGOOPHORECTOMY    . UMBILICAL HERNIA REPAIR     occurred with hysterectemy    Medications:  Current Outpatient Medications on File Prior to Visit  Medication Sig  . conjugated estrogens (PREMARIN) vaginal cream 1 applicator full daily for 2 weeks, then decrease to 1-3 times a week  . cyclobenzaprine (FLEXERIL) 10 MG tablet Take 1 tablet (10 mg total) by mouth at bedtime.  . montelukast (SINGULAIR) 10 MG tablet Take 10 mg by mouth at bedtime.  . naproxen (NAPROSYN) 500 MG tablet Take 1 tablet (500 mg total) by mouth 2 (two) times daily with a meal.  . Calcium Carbonate (CALCIUM 600 PO) Take 600 mg by mouth daily.   No current facility-administered medications on file prior to  visit.     Allergies:  Allergies  Allergen Reactions  . Codeine Other (See Comments)  . Pollen Extract     Social History:  Social History   Socioeconomic History  . Marital status: Married    Spouse name: Not on file  . Number of children: Not on file  . Years of education: Not on file  . Highest education level: Not on file  Occupational History  . Not on file  Social Needs  . Financial resource strain: Not on file  . Food insecurity    Worry: Not on file    Inability: Not on file  . Transportation needs    Medical: Not on file    Non-medical: Not on file  Tobacco Use  . Smoking status: Never Smoker  . Smokeless tobacco: Never Used  Substance and Sexual Activity  . Alcohol use: No  . Drug use: No  . Sexual activity: Yes    Birth control/protection: Surgical  Lifestyle  . Physical activity    Days per week: Not on file    Minutes per session: Not on file  . Stress: Not on file  Relationships  . Social Herbalist on phone: Not on file    Gets together: Not on file    Attends religious service: Not on file    Active member of club or organization: Not on file    Attends meetings of clubs or organizations: Not on file    Relationship status: Not on file  . Intimate partner violence    Fear of current or ex partner: Not on file    Emotionally abused: Not on file    Physically abused: Not on file    Forced sexual activity: Not on file  Other Topics Concern  . Not on file  Social History Narrative  . Not on file   Social History   Tobacco Use  Smoking Status Never Smoker  Smokeless Tobacco Never Used   Social History   Substance and Sexual Activity  Alcohol Use No    Family History:  Family History  Problem Relation Age of Onset  . Stroke Father   . Asthma Daughter   . Urticaria Son   . Food Allergy Son        red dye, dairy  . Allergic rhinitis Neg Hx   . Angioedema Neg Hx   . Eczema Neg Hx     Past medical history, surgical  history, medications, allergies, family history and social history reviewed with patient today and changes made to appropriate areas of  the chart.   Review of Systems  Constitutional: Negative.   HENT: Positive for sore throat. Negative for congestion, ear discharge, ear pain, hearing loss, nosebleeds, sinus pain and tinnitus.   Eyes: Positive for blurred vision (only without her glasses). Negative for double vision, photophobia, pain, discharge and redness.  Respiratory: Positive for cough. Negative for hemoptysis, sputum production, shortness of breath, wheezing and stridor.   Cardiovascular: Negative.   Gastrointestinal: Positive for heartburn. Negative for nausea and vomiting.  Genitourinary: Negative.   Musculoskeletal: Positive for joint pain and myalgias. Negative for back pain, falls and neck pain.  Skin: Negative.   Neurological: Positive for tingling. Negative for dizziness, tremors, sensory change, speech change, focal weakness, seizures, loss of consciousness, weakness and headaches.  Endo/Heme/Allergies: Positive for environmental allergies and polydipsia. Does not bruise/bleed easily.  Psychiatric/Behavioral: Positive for depression. Negative for hallucinations, memory loss, substance abuse and suicidal ideas. The patient is nervous/anxious and has insomnia.      All other ROS negative except what is listed above and in the HPI.      Objective:    BP 104/72   Pulse 72   Temp 97.7 F (36.5 C) (Oral)   Ht 5\' 4"  (1.626 m)   Wt 153 lb (69.4 kg)   SpO2 99%   BMI 26.26 kg/m   Wt Readings from Last 3 Encounters:  11/12/18 153 lb (69.4 kg)  08/10/18 152 lb (68.9 kg)  04/09/18 150 lb (68 kg)    Physical Exam Vitals signs and nursing note reviewed.  Constitutional:      General: She is not in acute distress.    Appearance: Normal appearance. She is not ill-appearing, toxic-appearing or diaphoretic.  HENT:     Head: Normocephalic and atraumatic.     Right Ear: Tympanic  membrane, ear canal and external ear normal. There is no impacted cerumen.     Left Ear: Tympanic membrane, ear canal and external ear normal. There is no impacted cerumen.     Nose: Nose normal. No congestion or rhinorrhea.     Mouth/Throat:     Mouth: Mucous membranes are moist.     Pharynx: Oropharynx is clear. No oropharyngeal exudate or posterior oropharyngeal erythema.  Eyes:     General: No scleral icterus.       Right eye: No discharge.        Left eye: No discharge.     Extraocular Movements: Extraocular movements intact.     Conjunctiva/sclera: Conjunctivae normal.     Pupils: Pupils are equal, round, and reactive to light.  Neck:     Musculoskeletal: Normal range of motion and neck supple. No neck rigidity or muscular tenderness.     Vascular: No carotid bruit.  Cardiovascular:     Rate and Rhythm: Normal rate and regular rhythm.     Pulses: Normal pulses.     Heart sounds: No murmur. No friction rub. No gallop.   Pulmonary:     Effort: Pulmonary effort is normal. No respiratory distress.     Breath sounds: Normal breath sounds. No stridor. No wheezing, rhonchi or rales.  Chest:     Chest wall: No tenderness.  Abdominal:     General: Abdomen is flat. Bowel sounds are normal. There is no distension.     Palpations: Abdomen is soft. There is no mass.     Tenderness: There is no abdominal tenderness. There is no right CVA tenderness, left CVA tenderness, guarding or rebound.     Hernia: No  hernia is present.  Genitourinary:    Comments: Breast and pelvic exams deferred with shared decision making Musculoskeletal:        General: No swelling, tenderness, deformity or signs of injury.     Right lower leg: No edema.     Left lower leg: No edema.  Lymphadenopathy:     Cervical: No cervical adenopathy.  Skin:    General: Skin is warm and dry.     Capillary Refill: Capillary refill takes less than 2 seconds.     Coloration: Skin is not jaundiced or pale.     Findings: No  bruising, erythema, lesion or rash.  Neurological:     General: No focal deficit present.     Mental Status: She is alert and oriented to person, place, and time. Mental status is at baseline.     Cranial Nerves: No cranial nerve deficit.     Sensory: No sensory deficit.     Motor: No weakness.     Coordination: Coordination normal.     Gait: Gait normal.     Deep Tendon Reflexes: Reflexes normal.  Psychiatric:        Mood and Affect: Mood is anxious and depressed.        Behavior: Behavior normal.        Thought Content: Thought content normal.        Judgment: Judgment normal.     Results for orders placed or performed in visit on 11/12/18  Microscopic Examination   URINE  Result Value Ref Range   WBC, UA None seen 0 - 5 /hpf   RBC 0-2 0 - 2 /hpf   Epithelial Cells (non renal) 0-10 0 - 10 /hpf   Bacteria, UA None seen None seen/Few  UA/M w/rflx Culture, Routine   Specimen: Urine   URINE  Result Value Ref Range   Specific Gravity, UA 1.010 1.005 - 1.030   pH, UA 6.0 5.0 - 7.5   Color, UA Yellow Yellow   Appearance Ur Clear Clear   Leukocytes,UA Negative Negative   Protein,UA Negative Negative/Trace   Glucose, UA Negative Negative   Ketones, UA Negative Negative   RBC, UA Trace (A) Negative   Bilirubin, UA Negative Negative   Urobilinogen, Ur 0.2 0.2 - 1.0 mg/dL   Nitrite, UA Negative Negative   Microscopic Examination See below:       Assessment & Plan:   Problem List Items Addressed This Visit      Cardiovascular and Mediastinum   Varicose veins of leg with pain, bilateral    Starting to hurt again. Would like to see vascular. New referral generated.       Relevant Orders   Ambulatory referral to Vascular Surgery     Respiratory   Allergic rhinitis    Not taking her singulair daily- advised her to do that. Call with any concerns.         Other   Major depression, recurrent ([place])    Not doing well. Will restart her lexapro and we'll recheck in 1  month.       Relevant Medications   escitalopram (LEXAPRO) 5 MG tablet    Other Visit Diagnoses    Routine general medical examination at a health care facility    -  Primary   Vaccines up to date. Screening labs checked today. Pap N/A. Mammogram ordered today. Call with any concerns. Continue to monitor.    Relevant Orders   CBC with Differential/Platelet   Comprehensive metabolic  panel   Lipid Panel w/o Chol/HDL Ratio   TSH   UA/M w/rflx Culture, Routine (Completed)       Follow up plan: Return in about 4 weeks (around 12/10/2018) for follow up mood.   LABORATORY TESTING:  - Pap smear: not applicable  IMMUNIZATIONS:   - Tdap: Tetanus vaccination status reviewed: last tetanus booster within 10 years. - Influenza: Postponed to flu season  SCREENING: -Mammogram: Ordered today  - Colonoscopy: Not applicable   PATIENT COUNSELING:   Advised to take 1 mg of folate supplement per day if capable of pregnancy.   Sexuality: Discussed sexually transmitted diseases, partner selection, use of condoms, avoidance of unintended pregnancy  and contraceptive alternatives.   Advised to avoid cigarette smoking.  I discussed with the patient that most people either abstain from alcohol or drink within safe limits (<=14/week and <=4 drinks/occasion for males, <=7/weeks and <= 3 drinks/occasion for females) and that the risk for alcohol disorders and other health effects rises proportionally with the number of drinks per week and how often a drinker exceeds daily limits.  Discussed cessation/primary prevention of drug use and availability of treatment for abuse.   Diet: Encouraged to adjust caloric intake to maintain  or achieve ideal body weight, to reduce intake of dietary saturated fat and total fat, to limit sodium intake by avoiding high sodium foods and not adding table salt, and to maintain adequate dietary potassium and calcium preferably from fresh fruits, vegetables, and low-fat dairy  products.    stressed the importance of regular exercise  Injury prevention: Discussed safety belts, safety helmets, smoke detector, smoking near bedding or upholstery.   Dental health: Discussed importance of regular tooth brushing, flossing, and dental visits.    NEXT PREVENTATIVE PHYSICAL DUE IN 1 YEAR. Return in about 4 weeks (around 12/10/2018) for follow up mood.

## 2018-11-12 NOTE — Assessment & Plan Note (Signed)
Not doing well. Will restart her lexapro and we'll recheck in 1 month.

## 2018-11-12 NOTE — Assessment & Plan Note (Signed)
Not taking her singulair daily- advised her to do that. Call with any concerns.

## 2018-11-13 LAB — COMPREHENSIVE METABOLIC PANEL
ALT: 19 IU/L (ref 0–32)
AST: 18 IU/L (ref 0–40)
Albumin/Globulin Ratio: 1.7 (ref 1.2–2.2)
Albumin: 4.3 g/dL (ref 3.8–4.8)
Alkaline Phosphatase: 56 IU/L (ref 39–117)
BUN/Creatinine Ratio: 18 (ref 9–23)
BUN: 12 mg/dL (ref 6–24)
Bilirubin Total: 0.4 mg/dL (ref 0.0–1.2)
CO2: 26 mmol/L (ref 20–29)
Calcium: 9.3 mg/dL (ref 8.7–10.2)
Chloride: 102 mmol/L (ref 96–106)
Creatinine, Ser: 0.67 mg/dL (ref 0.57–1.00)
GFR calc Af Amer: 120 mL/min/{1.73_m2} (ref >59)
GFR calc non Af Amer: 104 mL/min/{1.73_m2} (ref >59)
Globulin, Total: 2.6 g/dL (ref 1.5–4.5)
Glucose: 86 mg/dL (ref 65–99)
Potassium: 4.4 mmol/L (ref 3.5–5.2)
Sodium: 141 mmol/L (ref 134–144)
Total Protein: 6.9 g/dL (ref 6.0–8.5)

## 2018-11-13 LAB — CBC WITH DIFFERENTIAL/PLATELET
Basophils Absolute: 0 10*3/uL (ref 0.0–0.2)
Basos: 0 %
EOS (ABSOLUTE): 0.2 10*3/uL (ref 0.0–0.4)
Eos: 4 %
Hematocrit: 37.7 % (ref 34.0–46.6)
Hemoglobin: 12.5 g/dL (ref 11.1–15.9)
Immature Grans (Abs): 0 10*3/uL (ref 0.0–0.1)
Immature Granulocytes: 0 %
Lymphocytes Absolute: 1.8 10*3/uL (ref 0.7–3.1)
Lymphs: 40 %
MCH: 29.3 pg (ref 26.6–33.0)
MCHC: 33.2 g/dL (ref 31.5–35.7)
MCV: 88 fL (ref 79–97)
Monocytes Absolute: 0.2 10*3/uL (ref 0.1–0.9)
Monocytes: 5 %
Neutrophils Absolute: 2.2 10*3/uL (ref 1.4–7.0)
Neutrophils: 51 %
Platelets: 174 10*3/uL (ref 150–450)
RBC: 4.27 x10E6/uL (ref 3.77–5.28)
RDW: 12.6 % (ref 11.7–15.4)
WBC: 4.4 10*3/uL (ref 3.4–10.8)

## 2018-11-13 LAB — TSH: TSH: 2.05 u[IU]/mL (ref 0.450–4.500)

## 2018-11-13 LAB — LIPID PANEL W/O CHOL/HDL RATIO
Cholesterol, Total: 189 mg/dL (ref 100–199)
HDL: 63 mg/dL (ref >39)
LDL Calculated: 106 mg/dL — ABNORMAL HIGH (ref 0–99)
Triglycerides: 101 mg/dL (ref 0–149)
VLDL Cholesterol Cal: 20 mg/dL (ref 5–40)

## 2018-11-14 ENCOUNTER — Encounter: Payer: Self-pay | Admitting: Family Medicine

## 2018-11-15 ENCOUNTER — Other Ambulatory Visit: Payer: Self-pay

## 2018-11-15 ENCOUNTER — Ambulatory Visit: Payer: 59 | Admitting: Physical Therapy

## 2018-11-15 DIAGNOSIS — M62838 Other muscle spasm: Secondary | ICD-10-CM

## 2018-11-15 DIAGNOSIS — R293 Abnormal posture: Secondary | ICD-10-CM

## 2018-11-15 DIAGNOSIS — M5441 Lumbago with sciatica, right side: Secondary | ICD-10-CM | POA: Diagnosis not present

## 2018-11-15 DIAGNOSIS — G8929 Other chronic pain: Secondary | ICD-10-CM | POA: Diagnosis not present

## 2018-11-15 DIAGNOSIS — M542 Cervicalgia: Secondary | ICD-10-CM

## 2018-11-15 DIAGNOSIS — R2689 Other abnormalities of gait and mobility: Secondary | ICD-10-CM | POA: Diagnosis not present

## 2018-11-15 DIAGNOSIS — M545 Low back pain: Secondary | ICD-10-CM | POA: Diagnosis not present

## 2018-11-15 NOTE — Patient Instructions (Signed)
Standing with one hand on wall  Figure-4 and tap diagonally back  10 reps   Seated: ankles to one side, grab outer ankle and slide foot back , lean to opp side, hand on bed  5 reps

## 2018-11-15 NOTE — Therapy (Signed)
[place] MAIN [facility] SERVICES [address], [place], [address] Phone: [phone]   Fax:  [phone]  Physical Therapy Treatment  Patient Details  Name: Karen Walker. MRN: [id number] Date of Birth: 05/10/1970 Referring Provider (PT): Park Liter    Encounter Date: 11/15/2018  PT End of Session - 11/15/18 1718    Visit Number  5    Number of Visits  10    Date for PT Re-Evaluation  12/20/18    PT Start Time  3299    PT Stop Time  2426    PT Time Calculation (min)  51 min    Activity Tolerance  Patient tolerated treatment well;No increased pain    Behavior During Therapy  WFL for tasks assessed/performed       Past Medical History:  Diagnosis Date  . Allergic rhinitis   . Anxiety   . Depression   . History of skin cancer   . Hx of migraine headaches    light sensivity, unilateral HA  . Urticaria     Past Surgical History:  Procedure Laterality Date  . ABDOMINAL HYSTERECTOMY  2014  . BREAST CYST ASPIRATION Right 2013   negative. Done at Dr. Dwyane Luo office  . HERNIA REPAIR  2014  . hysterectemy    . melonoma removal on R medial arch of foot  2008  . TOTAL ABDOMINAL HYSTERECTOMY W/ BILATERAL SALPINGOOPHORECTOMY    . UMBILICAL HERNIA REPAIR     occurred with hysterectemy    There were no vitals filed for this visit.  Subjective Assessment - 11/15/18 1515    Subjective  Pt reported her radiating back pain is 7-8/10 compared to the first visit. Pt is able to sleep more and wakes up only one time and able to fall back asleep faster.    Pertinent History  surgery for CA melanoma on medial arch R LE 2008, hysterectemy, and  umbilical hernia repair 8341         [facility] PT Assessment - 11/15/18 2254      Palpation   Palpation comment  moderate restrictions over abdominal scar ( decreased post Tx), increased tightness at posterior pelvic floor mm R , referred pain to iliac crest                      [facility] Adult PT Treatment/Exercise - 11/15/18 1758      Manual Therapy   Manual therapy comments  abdominal                PT Short Term Goals - 11/01/18 1824      PT SHORT TERM GOAL #1   Title  .    Baseline  .      PT SHORT TERM GOAL #2   Title  .    Baseline  .      PT SHORT TERM GOAL #3   Title  .    Baseline  .        PT Long Term Goals - 10/11/18 1602      PT LONG TERM GOAL #1   Title  cervical endurance test: 29 sec before fatigue increase to > 1.5  min before fatige in order to restore neck/ shoulder mobility and less pain by the end of work day    Time  10    Period  Weeks    Status  New    Target Date  12/20/18      PT  LONG TERM GOAL #2   Title  Pt will decrease QUICK DASH from  55% to 47% ino rder to return to ADLs and work ( [employer] 8%)    Time  10    Period  Weeks    Status  New    Target Date  12/20/18      PT LONG TERM GOAL #3   Title  Pt will increase distance from earlobe to acromium from 19 cm to > 20 cm and  demo decrease R lateral tightness along T1-3  in order to minimize forward head posture    Time  4    Period  Weeks    Status  New    Target Date  11/08/18      PT LONG TERM GOAL #4   Title  Pt will decrease her QUICK DASH Work MOdule score from 16 pts to < 10 pts in order to perform work duties with lifting    Time  8    Period  Weeks    Status  New    Target Date  12/06/18      PT LONG TERM GOAL #5   Title  Pt will report decreased 50% from 8/10-10/10 low back pain and less frequency of radiating pain down to R toe by 50% in order to sit, stand, walk    Time  5    Period  Weeks    Status  New    Target Date  11/15/18      Additional Long Term Goals   Additional Long Term Goals  Yes            Plan - 11/15/18 1718    Clinical Impression Statement  Pt is progressing well and reported she has been sleeping better as pain is no longer interrupting her sleep but only 1 x per night and she is  able to fall back asleep quickly. Today pt demo'd decreased abdominal scar restrictions/ increased deep core strength, and decreased R posterior pelvic floor tightness.  Pt continues to benefit from skilled PT.    Examination-Activity Limitations  Hygiene/Grooming;Reach Overhead;Bend    Rehab Potential  Good    PT Frequency  1x / week    PT Duration  --   10   PT Treatment/Interventions  ADLs/Self Care Home Management;Moist Heat;Therapeutic activities;Patient/family education;Scar mobilization;Aquatic Therapy;Therapeutic exercise;Passive range of motion;Balance training;Manual techniques;Dry needling;Cryotherapy;Stair training;Neuromuscular re-education;Energy conservation;Functional mobility training    Consulted and Agree with Plan of Care  Patient       Patient will benefit from skilled therapeutic intervention in order to improve the following deficits and impairments:  Impaired flexibility, Improper body mechanics, Decreased range of motion, Pain, Decreased activity tolerance, Increased fascial restricitons, Increased muscle spasms, Decreased scar mobility, Decreased endurance, Decreased safety awareness, Decreased mobility, Decreased strength, Decreased knowledge of precautions, Decreased coordination  Visit Diagnosis: 1. Other muscle spasm   2. Neck pain   3. Chronic right-sided low back pain with right-sided sciatica   4. Other abnormalities of gait and mobility   5. Abnormal posture        Problem List Patient Active Problem List   Diagnosis Date Noted  . Vaginal atrophy 06/24/2017  . Multiple allergies 01/13/2017  . Skin cancer 01/13/2017  . Major depression, recurrent ([place]) 12/26/2016  . Swelling of limb 12/12/2016  . Varicose veins of leg with pain, bilateral 12/12/2016  . Breast lump on right side at 8 o'clock position 12/06/2014  . Contact dermatitis 12/06/2014  . Allergic  rhinitis   . History of skin cancer     [name] ,PT, DPT, E-RYT  11/15/2018, 10:55  PM  [place] MAIN [facility] SERVICES [address] [place], [place], [address] Phone: [phone]   Fax:  [phone]  Name: Karen Walker. MRN: [id number] Date of Birth: December 27, 1970

## 2018-11-22 ENCOUNTER — Ambulatory Visit: Payer: 59 | Admitting: Physical Therapy

## 2018-11-22 ENCOUNTER — Other Ambulatory Visit: Payer: Self-pay

## 2018-11-22 DIAGNOSIS — M545 Low back pain: Secondary | ICD-10-CM | POA: Diagnosis not present

## 2018-11-22 DIAGNOSIS — M5441 Lumbago with sciatica, right side: Secondary | ICD-10-CM

## 2018-11-22 DIAGNOSIS — R293 Abnormal posture: Secondary | ICD-10-CM

## 2018-11-22 DIAGNOSIS — M542 Cervicalgia: Secondary | ICD-10-CM | POA: Diagnosis not present

## 2018-11-22 DIAGNOSIS — R2689 Other abnormalities of gait and mobility: Secondary | ICD-10-CM

## 2018-11-22 DIAGNOSIS — G8929 Other chronic pain: Secondary | ICD-10-CM | POA: Diagnosis not present

## 2018-11-22 DIAGNOSIS — M62838 Other muscle spasm: Secondary | ICD-10-CM | POA: Diagnosis not present

## 2018-11-22 NOTE — Patient Instructions (Signed)
Stretches on bed, on your back:  Cross thigh over opposite thigh  Figure-4    V-heel slides    10-15 reps

## 2018-11-22 NOTE — Therapy (Signed)
[place] MAIN [facility] SERVICES [address] [place], [place], [address] Phone: [phone]   Fax:  [phone]  Physical Therapy Treatment  Patient Details  Name: Karen Walker. MRN: [id number] Date of Birth: Sep 05, 1970 Referring Provider (PT): Park Liter    Encounter Date: 11/22/2018  PT End of Session - 11/22/18 1610    Visit Number  6    Number of Visits  10    Date for PT Re-Evaluation  12/20/18    PT Start Time  1884    PT Stop Time  1610    PT Time Calculation (min)  55 min    Activity Tolerance  Patient tolerated treatment well;No increased pain    Behavior During Therapy  WFL for tasks assessed/performed       Past Medical History:  Diagnosis Date  . Allergic rhinitis   . Anxiety   . Depression   . History of skin cancer   . Hx of migraine headaches    light sensivity, unilateral HA  . Urticaria     Past Surgical History:  Procedure Laterality Date  . ABDOMINAL HYSTERECTOMY  2014  . BREAST CYST ASPIRATION Right 2013   negative. Done at Dr. Dwyane Luo office  . HERNIA REPAIR  2014  . hysterectemy    . melonoma removal on R medial arch of foot  2008  . TOTAL ABDOMINAL HYSTERECTOMY W/ BILATERAL SALPINGOOPHORECTOMY    . UMBILICAL HERNIA REPAIR     occurred with hysterectemy    There were no vitals filed for this visit.  Subjective Assessment - 11/22/18 1519    Subjective  Pt had increased pain after last session. The next day, the pain was better and she felt only soreness No radiating pain for one day. The days after that day, the pain returned with radiating pain down both legs.  Over the weekend, pt was in pain after doing household chores. Pt has been wearing a bra that is looser and also a corset suite.  The corset suite has been helping at work to decrease the radiating pain intensity 8/10 to 6/10.    Pertinent History  surgery for CA melanoma on medial arch R LE 2008, hysterectemy, and  umbilical hernia repair  2014         [facility] PT Assessment - 11/22/18 1802      AROM   Overall AROM Comments  limited FADDER in supine B pre Tx, post Tx: increased hip abd/ ER/ flexion      Palpation   Palpation comment  increased tightness at R adductor magnus/ longus, IT band  ( decreased post Tx)                 Pelvic Floor Special Questions - 11/22/18 1800    External Perineal Exam  increased tightness at posterior pelvic floor, glut mm        [facility] Adult PT Treatment/Exercise - 11/22/18 1743      Exercises   Other Exercises   see HEP       Manual Therapy   Other Manual Therapy  long axis distraction BLE, STM/MWM at long adductor longus/ magnus R mm, IT band R , posterior pelvic mm                PT Long Term Goals - 10/11/18 1602      PT LONG TERM GOAL #1   Title  cervical endurance test: 29 sec before fatigue increase to > 1.5  min  before fatige in order to restore neck/ shoulder mobility and less pain by the end of work day    Time  10    Period  Weeks    Status  New    Target Date  12/20/18      PT LONG TERM GOAL #2   Title  Pt will decrease QUICK DASH from  55% to 47% ino rder to return to ADLs and work ( [employer] 8%)    Time  10    Period  Weeks    Status  New    Target Date  12/20/18      PT LONG TERM GOAL #3   Title  Pt will increase distance from earlobe to acromium from 19 cm to > 20 cm and  demo decrease R lateral tightness along T1-3  in order to minimize forward head posture    Time  4    Period  Weeks    Status  New    Target Date  11/08/18      PT LONG TERM GOAL #4   Title  Pt will decrease her QUICK DASH Work MOdule score from 16 pts to < 10 pts in order to perform work duties with lifting    Time  8    Period  Weeks    Status  New    Target Date  12/06/18      PT LONG TERM GOAL #5   Title  Pt will report decreased 50% from 8/10-10/10 low back pain and less frequency of radiating pain down to R toe by 50% in order to sit, stand, walk    Time  5     Period  Weeks    Status  New    Target Date  11/15/18      Additional Long Term Goals   Additional Long Term Goals  Yes            Plan - 11/22/18 1739    Clinical Impression Statement  Pt required external manual Tx to decrease tightness along posterior pelvic floor and glut muscles to increase hip abduction/ ER/ flexion. Pt tolerated Tx without pain. Pt reported no radiating pain down B legs post Tx. Pt continues to benefit from skilled PT.    Examination-Activity Limitations  Hygiene/Grooming;Reach Overhead;Bend    Rehab Potential  Good    PT Frequency  1x / week    PT Duration  --   10   PT Treatment/Interventions  ADLs/Self Care Home Management;Moist Heat;Therapeutic activities;Patient/family education;Scar mobilization;Aquatic Therapy;Therapeutic exercise;Passive range of motion;Balance training;Manual techniques;Dry needling;Cryotherapy;Stair training;Neuromuscular re-education;Energy conservation;Functional mobility training    Consulted and Agree with Plan of Care  Patient       Patient will benefit from skilled therapeutic intervention in order to improve the following deficits and impairments:  Impaired flexibility, Improper body mechanics, Decreased range of motion, Pain, Decreased activity tolerance, Increased fascial restricitons, Increased muscle spasms, Decreased scar mobility, Decreased endurance, Decreased safety awareness, Decreased mobility, Decreased strength, Decreased knowledge of precautions, Decreased coordination  Visit Diagnosis: 1. Chronic right-sided low back pain with right-sided sciatica   2. Other abnormalities of gait and mobility   3. Abnormal posture   4. Neck pain        Problem List Patient Active Problem List   Diagnosis Date Noted  . Vaginal atrophy 06/24/2017  . Multiple allergies 01/13/2017  . Skin cancer 01/13/2017  . Major depression, recurrent ([place]) 12/26/2016  . Swelling of limb 12/12/2016  . Varicose veins  of leg with pain,  bilateral 12/12/2016  . Breast lump on right side at 8 o'clock position 12/06/2014  . Contact dermatitis 12/06/2014  . Allergic rhinitis   . History of skin cancer     [name] ,PT, DPT, E-RYT  11/22/2018, 6:04 PM  [place] MAIN [facility] SERVICES [address] [place], [place], [address] Phone: [phone]   Fax:  [phone]  Name: Karen Walker. MRN: [id number] Date of Birth: [birth year]

## 2018-11-23 ENCOUNTER — Ambulatory Visit (INDEPENDENT_AMBULATORY_CARE_PROVIDER_SITE_OTHER): Payer: 59 | Admitting: Nurse Practitioner

## 2018-11-23 ENCOUNTER — Encounter (INDEPENDENT_AMBULATORY_CARE_PROVIDER_SITE_OTHER): Payer: Self-pay | Admitting: Nurse Practitioner

## 2018-11-23 ENCOUNTER — Other Ambulatory Visit: Payer: Self-pay

## 2018-11-23 VITALS — BP 91/61 | HR 86 | Resp 10 | Ht 64.0 in | Wt 158.0 lb

## 2018-11-23 DIAGNOSIS — I83813 Varicose veins of bilateral lower extremities with pain: Secondary | ICD-10-CM

## 2018-11-23 DIAGNOSIS — J309 Allergic rhinitis, unspecified: Secondary | ICD-10-CM | POA: Diagnosis not present

## 2018-11-26 ENCOUNTER — Other Ambulatory Visit: Payer: Self-pay

## 2018-11-26 ENCOUNTER — Other Ambulatory Visit (INDEPENDENT_AMBULATORY_CARE_PROVIDER_SITE_OTHER): Payer: Self-pay | Admitting: Nurse Practitioner

## 2018-11-26 ENCOUNTER — Ambulatory Visit (INDEPENDENT_AMBULATORY_CARE_PROVIDER_SITE_OTHER): Payer: 59 | Admitting: Nurse Practitioner

## 2018-11-26 ENCOUNTER — Ambulatory Visit (INDEPENDENT_AMBULATORY_CARE_PROVIDER_SITE_OTHER): Payer: 59

## 2018-11-26 ENCOUNTER — Encounter (INDEPENDENT_AMBULATORY_CARE_PROVIDER_SITE_OTHER): Payer: Self-pay | Admitting: Nurse Practitioner

## 2018-11-26 VITALS — BP 108/68 | HR 85 | Resp 12 | Ht 64.0 in | Wt 154.0 lb

## 2018-11-26 DIAGNOSIS — M7989 Other specified soft tissue disorders: Secondary | ICD-10-CM

## 2018-11-26 DIAGNOSIS — I83813 Varicose veins of bilateral lower extremities with pain: Secondary | ICD-10-CM | POA: Diagnosis not present

## 2018-11-26 DIAGNOSIS — R600 Localized edema: Secondary | ICD-10-CM

## 2018-11-28 ENCOUNTER — Encounter (INDEPENDENT_AMBULATORY_CARE_PROVIDER_SITE_OTHER): Payer: Self-pay | Admitting: Nurse Practitioner

## 2018-11-28 NOTE — Progress Notes (Signed)
SUBJECTIVE:  Patient ID: Karen Mess., female    DOB: January 12, 1971, 48 y.o.   MRN: [id number] Chief Complaint  Patient presents with  . Varicose Veins    HPI  Karen Spady. is a 48 y.o. female that presents today with bilateral leg pain due to varicose veins.  Patient recently had sclerotherapy done to her bilateral lower extremities approximately a year or so ago by this office.  Since that time the pain and discomfort that she had in her lower extremities subsided however they have since returned.  The patient does her best to comply with conservative therapies such as compression stockings, elevation of her lower extremities, exercise and use of NSAIDs however despite this the patient continues to have pain discomfort of her bilateral lower extremities.  This is affecting her daily activities such as work and the ability to do housework.  Patient denies any fever, chills, nausea, vomiting or diarrhea.  Past Medical History:  Diagnosis Date  . Allergic rhinitis   . Anxiety   . Depression   . History of skin cancer   . Hx of migraine headaches    light sensivity, unilateral HA  . Urticaria     Past Surgical History:  Procedure Laterality Date  . ABDOMINAL HYSTERECTOMY  2014  . BREAST CYST ASPIRATION Right 2013   negative. Done at Dr. Dwyane Luo office  . HERNIA REPAIR  2014  . hysterectemy    . melonoma removal on R medial arch of foot  2008  . TOTAL ABDOMINAL HYSTERECTOMY W/ BILATERAL SALPINGOOPHORECTOMY    . UMBILICAL HERNIA REPAIR     occurred with hysterectemy    Social History   Socioeconomic History  . Marital status: Married    Spouse name: Not on file  . Number of children: Not on file  . Years of education: Not on file  . Highest education level: Not on file  Occupational History  . Not on file  Social Needs  . Financial resource strain: Not on file  . Food insecurity    Worry: Not on file    Inability: Not on file  . Transportation needs    Medical:  Not on file    Non-medical: Not on file  Tobacco Use  . Smoking status: Never Smoker  . Smokeless tobacco: Never Used  Substance and Sexual Activity  . Alcohol use: No  . Drug use: No  . Sexual activity: Yes    Birth control/protection: Surgical  Lifestyle  . Physical activity    Days per week: Not on file    Minutes per session: Not on file  . Stress: Not on file  Relationships  . Social Herbalist on phone: Not on file    Gets together: Not on file    Attends religious service: Not on file    Active member of club or organization: Not on file    Attends meetings of clubs or organizations: Not on file    Relationship status: Not on file  . Intimate partner violence    Fear of current or ex partner: Not on file    Emotionally abused: Not on file    Physically abused: Not on file    Forced sexual activity: Not on file  Other Topics Concern  . Not on file  Social History Narrative  . Not on file    Family History  Problem Relation Age of Onset  . Stroke Father   . Asthma Daughter   .  Urticaria Son   . Food Allergy Son        red dye, dairy  . Allergic rhinitis Neg Hx   . Angioedema Neg Hx   . Eczema Neg Hx     Allergies  Allergen Reactions  . Codeine Other (See Comments)  . Pollen Extract      Review of Systems   Review of Systems: Negative Unless Checked Constitutional: [] Weight loss  [] Fever  [] Chills Cardiac: [] Chest pain   []  Atrial Fibrillation  [] Palpitations   [] Shortness of breath when laying flat   [] Shortness of breath with exertion. [] Shortness of breath at rest Vascular:  [] Pain in legs with walking   [] Pain in legs with standing [] Pain in legs when laying flat   [] Claudication    [] Pain in feet when laying flat    [] History of DVT   [] Phlebitis   [] Swelling in legs   [x] Varicose veins   [] Non-healing ulcers Pulmonary:   [] Uses home oxygen   [] Productive cough   [] Hemoptysis   [] Wheeze  [] COPD   [] Asthma Neurologic:  [] Dizziness    [] Seizures  [] Blackouts [] History of stroke   [] History of TIA  [] Aphasia   [] Temporary Blindness   [] Weakness or numbness in arm   [] Weakness or numbness in leg Musculoskeletal:   [] Joint swelling   [] Joint pain   [] Low back pain  []  History of Knee Replacement [] Arthritis [] back Surgeries  []  Spinal Stenosis    Hematologic:  [] Easy bruising  [] Easy bleeding   [] Hypercoagulable state   [] Anemic Gastrointestinal:  [] Diarrhea   [] Vomiting  [] Gastroesophageal reflux/heartburn   [] Difficulty swallowing. [] Abdominal pain Genitourinary:  [] Chronic kidney disease   [] Difficult urination  [] Anuric   [] Blood in urine [] Frequent urination  [] Burning with urination   [] Hematuria Skin:  [] Rashes   [] Ulcers [] Wounds Psychological:  [] History of anxiety   []  History of major depression  []  Memory Difficulties      OBJECTIVE:   Physical Exam  BP 91/61 (BP Location: Left Arm, Patient Position: Sitting, Cuff Size: Normal)   Pulse 86   Resp 10   Ht 5\' 4"  (1.626 m)   Wt 158 lb (71.7 kg)   BMI 27.12 kg/m   Gen: WD/WN, NAD Head: [place]/AT, No temporalis wasting.  Ear/Nose/Throat: Hearing grossly intact, nares w/o erythema or drainage Eyes: PER, EOMI, sclera nonicteric.  Neck: Supple, no masses.  No JVD.  Pulmonary:  Good air movement, no use of accessory muscles.  Cardiac: RRR Vascular: scattered varicosities bilaterally Vessel Right Left  Radial Palpable Palpable  Dorsalis Pedis Palpable Palpable  Posterior Tibial Palpable Palpable   Gastrointestinal: soft, non-distended. No guarding/no peritoneal signs.  Musculoskeletal: M/S 5/5 throughout.  No deformity or atrophy.  Neurologic: Pain and light touch intact in extremities.  Symmetrical.  Speech is fluent. Motor exam as listed above. Psychiatric: Judgment intact, Mood & affect appropriate for pt's clinical situation. Dermatologic: No Venous rashes. No Ulcers Noted.  No changes consistent with cellulitis. Lymph : No Cervical lymphadenopathy, no  lichenification or skin changes of chronic lymphedema.       ASSESSMENT AND PLAN:  1. Varicose veins of leg with pain, bilateral Patient is advised to continue with conservative therapy.  We will also have the patient return at her convenience for bilateral lower extremity venous reflux to determine what other possible options for treatment that can be explored.  2. Allergic rhinitis, unspecified seasonality, unspecified trigger Continue pulmonary medications and aerosols as already ordered, these medications have been reviewed and there are  no changes at this time.     Current Outpatient Medications on File Prior to Visit  Medication Sig Dispense Refill  . Calcium Carbonate (CALCIUM 600 PO) Take 600 mg by mouth daily.    Marland Kitchen conjugated estrogens (PREMARIN) vaginal cream 1 applicator full daily for 2 weeks, then decrease to 1-3 times a week 42.5 g 12  . cyclobenzaprine (FLEXERIL) 10 MG tablet Take 1 tablet (10 mg total) by mouth at bedtime. 30 tablet 0  . escitalopram (LEXAPRO) 5 MG tablet Take 1 tablet (5 mg total) by mouth daily. 30 tablet 3  . montelukast (SINGULAIR) 10 MG tablet Take 10 mg by mouth at bedtime.    . naproxen (NAPROSYN) 500 MG tablet Take 1 tablet (500 mg total) by mouth 2 (two) times daily with a meal. 60 tablet 3   No current facility-administered medications on file prior to visit.     There are no Patient Instructions on file for this visit. No follow-ups on file.   Kris Hartmann, NP  This note was completed with Sales executive.  Any errors are purely unintentional.

## 2018-11-29 ENCOUNTER — Ambulatory Visit: Payer: 59 | Admitting: Physical Therapy

## 2018-11-29 ENCOUNTER — Telehealth: Payer: Self-pay | Admitting: Family Medicine

## 2018-11-29 ENCOUNTER — Other Ambulatory Visit: Payer: Self-pay

## 2018-11-29 ENCOUNTER — Encounter (INDEPENDENT_AMBULATORY_CARE_PROVIDER_SITE_OTHER): Payer: Self-pay | Admitting: Nurse Practitioner

## 2018-11-29 DIAGNOSIS — R2689 Other abnormalities of gait and mobility: Secondary | ICD-10-CM | POA: Diagnosis not present

## 2018-11-29 DIAGNOSIS — M545 Low back pain: Secondary | ICD-10-CM | POA: Diagnosis not present

## 2018-11-29 DIAGNOSIS — M62838 Other muscle spasm: Secondary | ICD-10-CM | POA: Diagnosis not present

## 2018-11-29 DIAGNOSIS — R293 Abnormal posture: Secondary | ICD-10-CM

## 2018-11-29 DIAGNOSIS — M542 Cervicalgia: Secondary | ICD-10-CM

## 2018-11-29 DIAGNOSIS — G8929 Other chronic pain: Secondary | ICD-10-CM

## 2018-11-29 DIAGNOSIS — M5441 Lumbago with sciatica, right side: Secondary | ICD-10-CM

## 2018-11-29 NOTE — Telephone Encounter (Signed)
Shin-yiing calling from PT rehab she is discharging pt so that she can get a medical work up because PT has not helped with bilateral radiating pain down both legs. Pt has had 7 pt visits. red flag signs: interrupts sleep constant pain during day and night. Urging more imaging. Two cysts were found behind right knee. I cyst behind left knee according to her visit [facility] last week. PT did help with resolving neck and bilateral radiating arm pain.  Please advise

## 2018-11-29 NOTE — Telephone Encounter (Signed)
Please get scheduled for appointment- will need MRI

## 2018-11-29 NOTE — Progress Notes (Addendum)
SUBJECTIVE:  Patient ID: Karen Walker., female    DOB: 02/12/1971, 48 y.o.   MRN: [id number] Chief Complaint  Patient presents with  . Follow-up    HPI  Karen Walker is a 48 y.o. female The patient returns for followup evaluation 15 months after the initial visit. The patient had schlerotherapy of her bilateral lower extremities.  The patient continues to have pain in the lower extremities with dependency. The pain is lessened with elevation. Graduated compression stockings, Class I (20-30 mmHg), have been worn but the stockings do not eliminate the leg pain. Over-the-counter analgesics do not improve the symptoms. The degree of discomfort continues to interfere with daily activities. The patient notes the pain in the legs is causing problems with daily exercise, at the workplace and even with household activities and maintenance such as standing in the kitchen preparing meals and doing dishes.   Venous ultrasound shows normal deep venous system, no evidence of acute or chronic DVT.  Superficial reflux is not present bilaterally.  2 baker's cyst are noted behind her right knee with one behind the left.   Past Medical History:  Diagnosis Date  . Allergic rhinitis   . Anxiety   . Depression   . History of skin cancer   . Hx of migraine headaches    light sensivity, unilateral HA  . Urticaria     Past Surgical History:  Procedure Laterality Date  . ABDOMINAL HYSTERECTOMY  2014  . BREAST CYST ASPIRATION Right 2013   negative. Done at Dr. Dwyane Luo office  . HERNIA REPAIR  2014  . hysterectemy    . melonoma removal on R medial arch of foot  2008  . TOTAL ABDOMINAL HYSTERECTOMY W/ BILATERAL SALPINGOOPHORECTOMY    . UMBILICAL HERNIA REPAIR     occurred with hysterectemy    Social History   Socioeconomic History  . Marital status: Married    Spouse name: Not on file  . Number of children: Not on file  . Years of education: Not on file  . Highest education level: Not on file   Occupational History  . Not on file  Social Needs  . Financial resource strain: Not on file  . Food insecurity    Worry: Not on file    Inability: Not on file  . Transportation needs    Medical: Not on file    Non-medical: Not on file  Tobacco Use  . Smoking status: Never Smoker  . Smokeless tobacco: Never Used  Substance and Sexual Activity  . Alcohol use: No  . Drug use: No  . Sexual activity: Yes    Birth control/protection: Surgical  Lifestyle  . Physical activity    Days per week: Not on file    Minutes per session: Not on file  . Stress: Not on file  Relationships  . Social Herbalist on phone: Not on file    Gets together: Not on file    Attends religious service: Not on file    Active member of club or organization: Not on file    Attends meetings of clubs or organizations: Not on file    Relationship status: Not on file  . Intimate partner violence    Fear of current or ex partner: Not on file    Emotionally abused: Not on file    Physically abused: Not on file    Forced sexual activity: Not on file  Other Topics Concern  . Not on file  Social History Narrative  . Not on file    Family History  Problem Relation Age of Onset  . Stroke Father   . Asthma Daughter   . Urticaria Son   . Food Allergy Son        red dye, dairy  . Allergic rhinitis Neg Hx   . Angioedema Neg Hx   . Eczema Neg Hx     Allergies  Allergen Reactions  . Codeine Other (See Comments)  . Pollen Extract      Review of Systems   Review of Systems: Negative Unless Checked Constitutional: [] Weight loss  [] Fever  [] Chills Cardiac: [] Chest pain   []  Atrial Fibrillation  [] Palpitations   [] Shortness of breath when laying flat   [] Shortness of breath with exertion. [] Shortness of breath at rest Vascular:  [] Pain in legs with walking   [] Pain in legs with standing [] Pain in legs when laying flat   [] Claudication    [] Pain in feet when laying flat    [] History of DVT    [] Phlebitis   [x] Swelling in legs   [x] Varicose veins   [] Non-healing ulcers Pulmonary:   [] Uses home oxygen   [] Productive cough   [] Hemoptysis   [] Wheeze  [] COPD   [] Asthma Neurologic:  [] Dizziness   [] Seizures  [] Blackouts [] History of stroke   [] History of TIA  [] Aphasia   [] Temporary Blindness   [] Weakness or numbness in arm   [] Weakness or numbness in leg Musculoskeletal:   [] Joint swelling   [] Joint pain   [] Low back pain  []  History of Knee Replacement [] Arthritis [] back Surgeries  []  Spinal Stenosis    Hematologic:  [] Easy bruising  [] Easy bleeding   [] Hypercoagulable state   [] Anemic Gastrointestinal:  [] Diarrhea   [] Vomiting  [] Gastroesophageal reflux/heartburn   [] Difficulty swallowing. [] Abdominal pain Genitourinary:  [] Chronic kidney disease   [] Difficult urination  [] Anuric   [] Blood in urine [] Frequent urination  [] Burning with urination   [] Hematuria Skin:  [] Rashes   [] Ulcers [] Wounds Psychological:  [] History of anxiety   []  History of major depression  []  Memory Difficulties      OBJECTIVE:   Physical Exam  BP 108/68 (BP Location: Left Arm, Patient Position: Sitting, Cuff Size: Normal)   Pulse 85   Resp 12   Ht 5\' 4"  (1.626 m)   Wt 154 lb (69.9 kg)   BMI 26.43 kg/m   Gen: WD/WN, NAD Head: [place]/AT, No temporalis wasting.  Ear/Nose/Throat: Hearing grossly intact, nares w/o erythema or drainage Eyes: PER, EOMI, sclera nonicteric.  Neck: Supple, no masses.  No JVD.  Pulmonary:  Good air movement, no use of accessory muscles.  Cardiac: RRR Vascular: scattered varicosities present bilaterally.  Mild venous stasis changes to the legs bilaterally.  2+ soft pitting edema  Vessel Right Left  Radial Palpable Palpable  Dorsalis Pedis Palpable Palpable  Posterior Tibial Palpable Palpable   Gastrointestinal: soft, non-distended. No guarding/no peritoneal signs.  Musculoskeletal: M/S 5/5 throughout.  No deformity or atrophy.  Neurologic: Pain and light touch intact in  extremities.  Symmetrical.  Speech is fluent. Motor exam as listed above. Psychiatric: Judgment intact, Mood & affect appropriate for pt's clinical situation. Dermatologic: No Venous rashes. No Ulcers Noted.  No changes consistent with cellulitis. Lymph : No Cervical lymphadenopathy, no lichenification or skin changes of chronic lymphedema.       ASSESSMENT AND PLAN:  1. Varicose veins of leg with pain, bilateral Recommend:  The patient has had successful ablation of the previously incompetent saphenous venous  system but still has persistent symptoms of pain and swelling that are having a negative impact on daily life and daily activities.  Patient should undergo injection sclerotherapy to treat the residual varicosities.  The risks, benefits and alternative therapies were reviewed in detail with the patient.  All questions were answered.  The patient agrees to proceed with sclerotherapy at their convenience.  The patient will continue wearing the graduated compression stockings and using the over-the-counter pain medications to treat her symptoms.       2. Swelling of limb No surgery or intervention at this point in time.  I have reviewed my discussion with the patient regarding venous insufficiency and why it causes symptoms. I have discussed with the patient the chronic skin changes that accompany venous insufficiency and the long term sequela such as ulceration. Patient will contnue wearing graduated compression stockings on a daily basis, as this has provided excellent control of his edema. The patient will put the stockings on first thing in the morning and removing them in the evening. The patient is reminded not to sleep in the stockings.  In addition, behavioral modification including elevation during the day will be initiated. Exercise is strongly encouraged.     Current Outpatient Medications on File Prior to Visit  Medication Sig Dispense Refill  . Calcium Carbonate  (CALCIUM 600 PO) Take 600 mg by mouth daily.    Marland Kitchen conjugated estrogens (PREMARIN) vaginal cream 1 applicator full daily for 2 weeks, then decrease to 1-3 times a week 42.5 g 12  . cyclobenzaprine (FLEXERIL) 10 MG tablet Take 1 tablet (10 mg total) by mouth at bedtime. 30 tablet 0  . escitalopram (LEXAPRO) 5 MG tablet Take 1 tablet (5 mg total) by mouth daily. 30 tablet 3  . montelukast (SINGULAIR) 10 MG tablet Take 10 mg by mouth at bedtime.    . naproxen (NAPROSYN) 500 MG tablet Take 1 tablet (500 mg total) by mouth 2 (two) times daily with a meal. 60 tablet 3   No current facility-administered medications on file prior to visit.     There are no Patient Instructions on file for this visit. No follow-ups on file.   Kris Hartmann, NP  This note was completed with Sales executive.  Any errors are purely unintentional.

## 2018-11-30 NOTE — Therapy (Signed)
[facility] MAIN [facility] SERVICES [address] [place], [place], [address] Phone: [phone]   Fax:  [phone]  Physical Therapy Treatment / Discharge Summary   Patient Details  Name: Karen Kun. MRN: [id number] Date of Birth: Jan 17, 1971 Referring Provider (PT): Park Liter    Encounter Date: 11/29/2018  PT End of Session - 11/29/18 1517    Visit Number  8    Number of Visits  10    Date for PT Re-Evaluation  12/20/18    PT Start Time  1510    PT Stop Time  1600    PT Time Calculation (min)  50 min    Activity Tolerance  Patient tolerated treatment well;No increased pain    Behavior During Therapy  WFL for tasks assessed/performed       Past Medical History:  Diagnosis Date  . Allergic rhinitis   . Anxiety   . Depression   . History of skin cancer   . Hx of migraine headaches    light sensivity, unilateral HA  . Urticaria     Past Surgical History:  Procedure Laterality Date  . ABDOMINAL HYSTERECTOMY  2014  . BREAST CYST ASPIRATION Right 2013   negative. Done at Dr. Dwyane Luo office  . HERNIA REPAIR  2014  . hysterectemy    . melonoma removal on R medial arch of foot  2008  . TOTAL ABDOMINAL HYSTERECTOMY W/ BILATERAL SALPINGOOPHORECTOMY    . UMBILICAL HERNIA REPAIR     occurred with hysterectemy    There were no vitals filed for this visit.  Subjective Assessment - 11/29/18 1511    Subjective  Pt reported pain returned at the same level after last session as soon as pt was walking out of the clinic. Pt has pain shooting down her B LE during the day constantly and also at night which prevents her from sleep. Two weeks before, pt did sleep well without pain two days but since then, pt is not been able to sleep due to pain and not being able to find a comfortable position.  Pt went [facility]  [facility] as referred by Dr. Wynetta Emery because pt had told Dr. Wynetta Emery  about heat, soreness, and pain on her heels. US  revealed a no blood clots but 2 cysts behind the R knee and 1 behind the L knee.  These results were sent to [facility]. She has an appt with Dr. Wynetta Emery in Sept. Pt is not improving with her sleep this week. Pt is not able to sleep due to pain. Pt's low belly is in pain and she has to go pee. These Sx have been going on for 6 months. Pt has no more neck pain, no radiating pain down the arms. Pt only feels a spot on her midback that shoot up to her shoulder blade.     Pertinent History  surgery for CA melanoma on medial arch R LE 2008, hysterectemy, and  umbilical hernia repair 9935         [facility] PT Assessment - 11/30/18 1441      Observation/Other Assessments   Observations  improved posture, less forward head posture       Palpation   Palpation comment  increased tenderness at R interspinal rhomoboids  mm , hypomobility at T7                    [facility] Adult PT Treatment/Exercise - 11/30/18 1444  Therapeutic Activites    Other Therapeutic Activities  communicated with PCP re: possible non-musculoskeletal origins of BLE pain, request for further medical work up due to no change with BLE pain  , confirmed next appt with PCP       Manual Therapy   Manual therapy comments  STM/ MWM rhomoboids / interspinals R                PT Long Term Goals - 11/30/18 1450      PT LONG TERM GOAL #1   Title  cervical endurance test: 29 sec before fatigue increase to > 1.5  min before fatige in order to restore neck/ shoulder mobility and less pain by the end of work day    Time  10    Period  Weeks    Status  Unable to assess      PT LONG TERM GOAL #2   Title  Pt will decrease QUICK DASH from  55% to 47% ino rder to return to ADLs and work ( [employer] 8%)    Time  10    Period  Weeks    Status  Unable to assess      PT LONG TERM GOAL #3   Title  Pt will increase distance from earlobe to acromium from 19 cm to > 20 cm and  demo decrease R lateral tightness along T1-3  in order  to minimize forward head posture    Time  4    Period  Weeks    Status  Achieved      PT LONG TERM GOAL #4   Title  Pt will decrease her QUICK DASH Work MOdule score from 16 pts to < 10 pts in order to perform work duties with lifting    Time  8    Period  Weeks    Status  Unable to assess      PT LONG TERM GOAL #5   Title  Pt will report decreased 50% from 8/10-10/10 low back pain and less frequency of radiating pain down to R toe by 50% in order to sit, stand, walk    Time  5    Period  Weeks    Status  Not Met            Plan - 11/30/18 1440    Clinical Impression Statement  Pt's neck / shoulder pain has resolved across the past 8 visits. However, her BLE radiating pain has made no changes and continues to interrupt her sleep at night and remains constant in the day and night. Due to poor response to PT, pt's BLE pain is likely not due to a musculoskeletal source.  Tx have addressed abdominal scar restrictions, spinal mm tensions at lumbar, thoracic, and cervical regions.  Mobility has been regained in the spine and pelvic girdle. Deep core strengthening was implemented.   DPT phoned pt's PCP clinic and left a message referring pt to undergo more imaging and medical work up. Provided update to pt's no change with PT, pt's recent visit to [facility] with findings of 2 cysts behind R knee, 1 cyst behind L knee.  Pt's red flag signs include  BLE interrupting her sleep, pain occuring constant day and night, pt with a Hx of skin cancer.    Pt is being d/c at this time as pt will benefit from further medical work up.     Examination-Activity Limitations  Hygiene/Grooming;Reach Overhead;Bend    Rehab Potential  Good    PT Frequency  1x / week    PT Duration  --   10   PT Treatment/Interventions  ADLs/Self Care Home Management;Moist Heat;Therapeutic activities;Patient/family education;Scar mobilization;Aquatic Therapy;Therapeutic exercise;Passive range of motion;Balance  training;Manual techniques;Dry needling;Cryotherapy;Stair training;Neuromuscular re-education;Energy conservation;Functional mobility training    Consulted and Agree with Plan of Care  Patient       Patient will benefit from skilled therapeutic intervention in order to improve the following deficits and impairments:  Impaired flexibility, Improper body mechanics, Decreased range of motion, Pain, Decreased activity tolerance, Increased fascial restricitons, Increased muscle spasms, Decreased scar mobility, Decreased endurance, Decreased safety awareness, Decreased mobility, Decreased strength, Decreased knowledge of precautions, Decreased coordination  Visit Diagnosis: Chronic right-sided low back pain with right-sided sciatica  Other abnormalities of gait and mobility  Abnormal posture  Neck pain  Other muscle spasm     Problem List Patient Active Problem List   Diagnosis Date Noted  . Vaginal atrophy 06/24/2017  . Multiple allergies 01/13/2017  . Skin cancer 01/13/2017  . Major depression, recurrent ([place]) 12/26/2016  . Swelling of limb 12/12/2016  . Varicose veins of leg with pain, bilateral 12/12/2016  . Breast lump on right side at 8 o'clock position 12/06/2014  . Contact dermatitis 12/06/2014  . Allergic rhinitis   . History of skin cancer     [name] ,PT, DPT, E-RYT  11/30/2018, 2:50 PM  [place] MAIN [facility] SERVICES [address] [place], [place], [address] Phone: [phone]   Fax:  [phone]  Name: Karen Buresh. MRN: [id number] Date of Birth: 1970-06-19

## 2018-12-02 ENCOUNTER — Telehealth: Payer: Self-pay | Admitting: Family Medicine

## 2018-12-02 NOTE — Telephone Encounter (Signed)
Needs virtual ASAP

## 2018-12-02 NOTE — Telephone Encounter (Signed)
Routing to provider  

## 2018-12-02 NOTE — Telephone Encounter (Signed)
Patient states she has body aches, sore throat w/ swollen tonsils, stomach pain, vomiting (yesterday) and weakness and no appetite. Patient would like to know if a medication can be called in for her symptoms of if she can be worked in for an appointment. Patient has an iPhone and is willing to have a virtual visit if needed but prefers to have Dr. Durenda Age opinion before deciding what to do next. Please call patient to advise.

## 2018-12-03 ENCOUNTER — Other Ambulatory Visit: Payer: Self-pay

## 2018-12-03 ENCOUNTER — Ambulatory Visit (INDEPENDENT_AMBULATORY_CARE_PROVIDER_SITE_OTHER): Payer: 59 | Admitting: Family Medicine

## 2018-12-03 ENCOUNTER — Encounter: Payer: Self-pay | Admitting: Family Medicine

## 2018-12-03 DIAGNOSIS — R1013 Epigastric pain: Secondary | ICD-10-CM

## 2018-12-03 MED ORDER — OMEPRAZOLE 40 MG PO CPDR: 40.0000 mg | DELAYED_RELEASE_CAPSULE | Freq: Every day | ORAL | 0 refills | Status: DC

## 2018-12-03 NOTE — Progress Notes (Signed)
There were no vitals taken for this visit.   Subjective:    Patient ID: [name], female    DOB: 1970/05/22, 48 y.o.   MRN: [id number]  HPI: Karen Walker is a 48 y.o. female  Chief Complaint  Patient presents with  . Abdominal Pain   ABDOMINAL PAIN- has been having pain for about 2 days after eating chinese food and then had tacos with salsa and was throwing up yesterday  Duration:2 days ago Onset: sudden Severity: mild Quality: sore Location:  epigastric  Episode duration:  Radiation: no Frequency: constant Alleviating factors:  Aggravating factors: Status: better Treatments attempted: pepto bismol Fever: no Nausea: no Vomiting: yes Weight loss: no Decreased appetite: yes Diarrhea: no Constipation: no Blood in stool: no Heartburn: yes Jaundice: no Rash: no Dysuria/urinary frequency: no Hematuria: no History of sexually transmitted disease: no Recurrent NSAID use: no  Relevant past medical, surgical, family and social history reviewed and updated as indicated. Interim medical history since our last visit reviewed. Allergies and medications reviewed and updated.  Review of Systems  Constitutional: Negative.   HENT: Negative.   Respiratory: Negative.   Cardiovascular: Negative.   Gastrointestinal: Positive for abdominal pain, nausea and vomiting. Negative for abdominal distention, anal bleeding, blood in stool, constipation, diarrhea and rectal pain.  Skin: Negative.   Neurological: Negative.   Psychiatric/Behavioral: Negative.     Per HPI unless specifically indicated above     Objective:    There were no vitals taken for this visit.  Wt Readings from Last 3 Encounters:  11/26/18 154 lb (69.9 kg)  11/23/18 158 lb (71.7 kg)  11/12/18 153 lb (69.4 kg)    Physical Exam Vitals signs and nursing note reviewed.  Constitutional:      General: She is not in acute distress.    Appearance: Normal appearance. She is not ill-appearing,  toxic-appearing or diaphoretic.  HENT:     Head: Normocephalic and atraumatic.     Right Ear: External ear normal.     Left Ear: External ear normal.     Nose: Nose normal.     Mouth/Throat:     Mouth: Mucous membranes are moist.     Pharynx: Oropharynx is clear.  Eyes:     General: No scleral icterus.       Right eye: No discharge.        Left eye: No discharge.     Conjunctiva/sclera: Conjunctivae normal.     Pupils: Pupils are equal, round, and reactive to light.  Neck:     Musculoskeletal: Normal range of motion.  Pulmonary:     Effort: Pulmonary effort is normal. No respiratory distress.     Comments: Speaking in full sentences Musculoskeletal: Normal range of motion.  Skin:    Coloration: Skin is not jaundiced or pale.     Findings: No bruising, erythema, lesion or rash.  Neurological:     Mental Status: She is alert and oriented to person, place, and time. Mental status is at baseline.  Psychiatric:        Mood and Affect: Mood normal.        Behavior: Behavior normal.        Thought Content: Thought content normal.        Judgment: Judgment normal.     Results for orders placed or performed in visit on 11/12/18  Microscopic Examination   URINE  Result Value Ref Range   WBC, UA None seen 0 - 5 /hpf  RBC 0-2 0 - 2 /hpf   Epithelial Cells (non renal) 0-10 0 - 10 /hpf   Bacteria, UA None seen None seen/Few  CBC with Differential/Platelet  Result Value Ref Range   WBC 4.4 3.4 - 10.8 x10E3/uL   RBC 4.27 3.77 - 5.28 x10E6/uL   Hemoglobin 12.5 11.1 - 15.9 g/dL   Hematocrit 37.7 34.0 - 46.6 %   MCV 88 79 - 97 fL   MCH 29.3 26.6 - 33.0 pg   MCHC 33.2 31.5 - 35.7 g/dL   RDW 12.6 11.7 - 15.4 %   Platelets 174 150 - 450 x10E3/uL   Neutrophils 51 Not Estab. %   Lymphs 40 Not Estab. %   Monocytes 5 Not Estab. %   Eos 4 Not Estab. %   Basos 0 Not Estab. %   Neutrophils Absolute 2.2 1.4 - 7.0 x10E3/uL   Lymphocytes Absolute 1.8 0.7 - 3.1 x10E3/uL   Monocytes  Absolute 0.2 0.1 - 0.9 x10E3/uL   EOS (ABSOLUTE) 0.2 0.0 - 0.4 x10E3/uL   Basophils Absolute 0.0 0.0 - 0.2 x10E3/uL   Immature Granulocytes 0 Not Estab. %   Immature Grans (Abs) 0.0 0.0 - 0.1 x10E3/uL  Comprehensive metabolic panel  Result Value Ref Range   Glucose 86 65 - 99 mg/dL   BUN 12 6 - 24 mg/dL   Creatinine, Ser 0.67 0.57 - 1.00 mg/dL   GFR calc non Af Amer 104 >59 mL/min/1.73   GFR calc Af Amer 120 >59 mL/min/1.73   BUN/Creatinine Ratio 18 9 - 23   Sodium 141 134 - 144 mmol/L   Potassium 4.4 3.5 - 5.2 mmol/L   Chloride 102 96 - 106 mmol/L   CO2 26 20 - 29 mmol/L   Calcium 9.3 8.7 - 10.2 mg/dL   Total Protein 6.9 6.0 - 8.5 g/dL   Albumin 4.3 3.8 - 4.8 g/dL   Globulin, Total 2.6 1.5 - 4.5 g/dL   Albumin/Globulin Ratio 1.7 1.2 - 2.2   Bilirubin Total 0.4 0.0 - 1.2 mg/dL   Alkaline Phosphatase 56 39 - 117 IU/L   AST 18 0 - 40 IU/L   ALT 19 0 - 32 IU/L  Lipid Panel w/o Chol/HDL Ratio  Result Value Ref Range   Cholesterol, Total 189 100 - 199 mg/dL   Triglycerides 101 0 - 149 mg/dL   HDL 63 >39 mg/dL   VLDL Cholesterol Cal 20 5 - 40 mg/dL   LDL Calculated 106 (H) 0 - 99 mg/dL  TSH  Result Value Ref Range   TSH 2.050 0.450 - 4.500 uIU/mL  UA/M w/rflx Culture, Routine   Specimen: Urine   URINE  Result Value Ref Range   Specific Gravity, UA 1.010 1.005 - 1.030   pH, UA 6.0 5.0 - 7.5   Color, UA Yellow Yellow   Appearance Ur Clear Clear   Leukocytes,UA Negative Negative   Protein,UA Negative Negative/Trace   Glucose, UA Negative Negative   Ketones, UA Negative Negative   RBC, UA Trace (A) Negative   Bilirubin, UA Negative Negative   Urobilinogen, Ur 0.2 0.2 - 1.0 mg/dL   Nitrite, UA Negative Negative   Microscopic Examination See below:       Assessment & Plan:   Problem List Items Addressed This Visit    None    Visit Diagnoses    Epigastric abdominal pain    -  Primary   Likely irriation from rich/spicy food. Will treat with omeprazole for 2 weeks and  follow  up at that time. Call if not getting better or getting worse.        Follow up plan: Return As scheduled for 9/10.   . This visit was completed via D[employer]imity due to the restrictions of the COVID-19 pandemic. All issues as above were discussed and addressed. Physical exam was done as above through visual confirmation on Doximity. If it was felt that the patient should be evaluated in the office, they were directed there. The patient verbally consented to this visit. . Location of the patient: home . Location of the provider: work . Those involved with this call:  . Provider: Park Liter, DO . CMA: Tiffany Reel, CMA . Front Desk/Registration: Don Perking  . Time spent on call: 15 minutes with patient face to face via video conference. More than 50% of this time was spent in counseling and coordination of care. 23 minutes total spent in review of patient's record and preparation of their chart.

## 2018-12-06 ENCOUNTER — Ambulatory Visit: Payer: 59 | Admitting: Physical Therapy

## 2018-12-13 ENCOUNTER — Ambulatory Visit: Payer: 59 | Admitting: Family Medicine

## 2018-12-16 ENCOUNTER — Encounter: Payer: Self-pay | Admitting: Family Medicine

## 2018-12-16 ENCOUNTER — Other Ambulatory Visit: Payer: Self-pay

## 2018-12-16 ENCOUNTER — Ambulatory Visit (INDEPENDENT_AMBULATORY_CARE_PROVIDER_SITE_OTHER): Payer: 59 | Admitting: Family Medicine

## 2018-12-16 VITALS — BP 102/68 | HR 83 | Temp 98.2°F

## 2018-12-16 DIAGNOSIS — M545 Low back pain, unspecified: Secondary | ICD-10-CM

## 2018-12-16 DIAGNOSIS — F331 Major depressive disorder, recurrent, moderate: Secondary | ICD-10-CM

## 2018-12-16 DIAGNOSIS — Z23 Encounter for immunization: Secondary | ICD-10-CM

## 2018-12-16 MED ORDER — ESCITALOPRAM OXALATE 5 MG PO TABS: 5.0000 mg | ORAL_TABLET | Freq: Every day | ORAL | 1 refills | Status: DC

## 2018-12-16 NOTE — Patient Instructions (Signed)
Quiste de Chiropractor Cyst El quiste de Child psychotherapist, tambin llamado quiste poplteo, es una protuberancia como un saco que se forma detrs de la rodilla. El quiste se forma cuando el saco lleno de lquido (bolsa) que amortigua la articulacin de la rodilla se agranda. La bolsa que se transforma en un quiste de Child psychotherapist se encuentra en la parte posterior de la articulacin de la rodilla. Cules son las causas? En la [employer], un quiste de Child psychotherapist es la consecuencia de otro problema de rodilla que origina una hinchazn dentro de la rodilla. Esto hace que el lquido dentro de la articulacin de la rodilla (lquido sinovial) ingrese en la bolsa detrs de la rodilla, causando el agrandamiento de la bolsa. Qu incrementa el riesgo? Hay ms posibilidades de que desarrolle un quiste de Child psychotherapist si ya tiene un problema de rodilla, como por ejemplo:  Un desgarro del cartlago que amortigua la articulacin de la rodilla (rotura de meniscos).  Un desgarro de los tejidos que [employer] de la articulacin de la rodilla (desgarro de ligamentos).  Hinchazn de la rodilla por causa de artrosis, artritis reumatoide o gota. Cules son los signos o los sntomas? El quiste de Child psychotherapist no siempre causa sntomas. Una protuberancia detrs de la rodilla puede ser el nico signo de esta afeccin. La protuberancia puede ser dolorosa, especialmente cuando la rodilla est extendida. Si la protuberancia duele, Water quality scientist y Armed forces operational officer. Es posible que haya rigidez de la rodilla. Los sntomas pueden empeorar rpidamente si el quiste se rompe (rupturas). Si el quiste se rompe, los signos y sntomas pueden afectar la rodilla y la parte posterior de la pierna inferior (pantorrilla) y pueden incluir, entre otros:  Dolor repentino o que se intensifica.  Hinchazn.  Moretones. Cmo se diagnostica? Esta afeccin puede diagnosticarse en funcin de los sntomas y los antecedentes mdicos. El mdico Product/process development scientist un examen fsico. Estos pueden incluir los siguientes:  Palpar el quiste para controlar si est blando.  Revisar la rodilla para detectar signos de otra afeccin de rodilla que pudiera causar la hinchazn. Pueden hacerle estudios de diagnstico por imgenes, por ejemplo:  Radiografas.  Resonancia magntica (RM).  Ecografa. Cmo se trata? El quiste de Child psychotherapist que no es doloroso puede Armed forces operational officer sin Clinical research associate. Si el quiste se agranda o duele, es probable que mejore si se trata el problema de rodilla preexistente. El tratamiento para el quiste de Child psychotherapist puede incluir lo siguiente:  Magazine features editor.  No cargar la rodilla con peso. Esto significa no flexionar las rodillas para sostener el propio Engineer, site.  AINE (antiinflamatorios no esteroides) para reducir Conservation officer, historic buildings y la hinchazn.  Un procedimiento para drenar el lquido del quiste con Ardelia Mems aguja (aspiracin). Es posible que tambin le coloquen una inyeccin de medicamento que disminuye la hinchazn (corticoesteroides).  Ciruga. Esto puede ser necesario si otros tratamientos no funcionan. Esto habitualmente implica corregir el dao de la rodilla y extraer el quiste. Siga estas indicaciones en su casa:   Tome los medicamentos de venta libre y los recetados solamente como se lo haya indicado el mdico.  Descanse y retome sus actividades normales como se lo haya indicado el mdico. Evite las actividades que intensifiquen el dolor o la hinchazn. Pregntele al mdico qu actividades son seguras para usted.  Concurra a todas las visitas de control como se lo haya indicado el mdico. Esto es importante. Comunquese con un mdico si:  Scientist, clinical (histocompatibility and immunogenetics), rigidez o hinchazn de rodilla, que no mejora.  Solicite ayuda de inmediato si:  Tiene un dolor repentino o que empeora, e hinchazn en la zona de la pantorrilla. Esta informacin no tiene Marine scientist el consejo del mdico. Asegrese de hacerle al mdico cualquier pregunta  que tenga. Document Released: 03/24/2005 Document Revised: 10/23/2016 Document Reviewed: 09/21/2015 Elsevier Patient Education  [employer].

## 2018-12-16 NOTE — Progress Notes (Signed)
BP 102/68   Pulse 83   Temp 98.2 F (36.8 C)   SpO2 97%    Subjective:    Patient ID: Karen Mess., female    DOB: [birth year], 48 y.o.   MRN: [id number]  HPI: Karen Walker is a 48 y.o. female  Chief Complaint  Patient presents with  . Depression   DEPRESSION Mood status: uncontrolled Satisfied with current treatment?: yes Symptom severity: severe  Duration of current treatment : 1 month Side effects: no Medication compliance: fair compliance Psychotherapy/counseling: no  Previous psychiatric medications: lexapro Depressed mood: yes Anxious mood: yes Anhedonia: no Significant weight loss or gain: no Insomnia: no  Fatigue: yes Feelings of worthlessness or guilt: yes Impaired concentration/indecisiveness: yes Suicidal ideations: no Hopelessness: no Crying spells: no Depression screen [facility] 2/9 12/16/2018 12/16/2018 11/12/2018 02/12/2018 01/12/2018  Decreased Interest 1 1 2 1 1   Down, Depressed, Hopeless 1 1 2 1 1   PHQ - 2 Score 2 2 4 2 2   Altered sleeping 1 1 3 3 3   Tired, decreased energy 1 1 3 3 2   Change in appetite 1 1 3 3 1   Feeling bad or failure about yourself  1 1 3 3  -  Trouble concentrating 1 1 3 3  0  Moving slowly or fidgety/restless 2 2 1 3  0  Suicidal thoughts 1 0 0 1 3  PHQ-9 Score 10 9 20 21 11   Difficult doing work/chores Not difficult at all Very difficult Very difficult Very difficult -   GAD 7 : Generalized Anxiety Score 12/16/2018 12/16/2018 11/12/2018 01/12/2018  Nervous, Anxious, on Edge 3 3 2 1   Control/stop worrying 3 3 1 3   Worry too much - different things 3 3 1 3   Trouble relaxing 3 3 1 3   Restless 3 3 1 2   Easily annoyed or irritable 3 3 1 3   Afraid - awful might happen 3 3 1 3   Total GAD 7 Score 21 21 8 18   Anxiety Difficulty Not difficult at all Not difficult at all Not difficult at all -   BACK PAIN Duration: few months Mechanism of injury: no trauma Location: Right and low back Onset: sudden Severity: moderate Quality: aching,  numb, throbbing Frequency: constant Radiation: R leg below the knee Aggravating factors: laying down, work, walking Alleviating factors: nothing Status: stable Treatments attempted: muscle relaxer, rest, ice, heat, APAP, ibuprofen, aleve, physical therapy and HEPRelief with NSAIDs?: mild Nighttime pain:  yes Paresthesias / decreased sensation:  yes Bowel / bladder incontinence:  no Fevers:  no Dysuria / urinary frequency:  no  Relevant past medical, surgical, family and social history reviewed and updated as indicated. Interim medical history since our last visit reviewed. Allergies and medications reviewed and updated.  Review of Systems  Constitutional: Negative.   Respiratory: Negative.   Cardiovascular: Negative.   Musculoskeletal: Positive for back pain and myalgias. Negative for arthralgias, gait problem, joint swelling, neck pain and neck stiffness.  Skin: Negative.   Neurological: Positive for weakness and numbness. Negative for dizziness, tremors, seizures, syncope, facial asymmetry, speech difficulty, light-headedness and headaches.  Psychiatric/Behavioral: Positive for dysphoric mood. Negative for agitation, behavioral problems, confusion, decreased concentration, hallucinations, self-injury, sleep disturbance and suicidal ideas. The patient is nervous/anxious. The patient is not hyperactive.     Per HPI unless specifically indicated above     Objective:    BP 102/68   Pulse 83   Temp 98.2 F (36.8 C)   SpO2 97%   Wt Readings from Last  3 Encounters:  11/26/18 154 lb (69.9 kg)  11/23/18 158 lb (71.7 kg)  11/12/18 153 lb (69.4 kg)    Physical Exam Vitals signs and nursing note reviewed.  Constitutional:      General: She is not in acute distress.    Appearance: Normal appearance. She is not ill-appearing, toxic-appearing or diaphoretic.  HENT:     Head: Normocephalic and atraumatic.     Right Ear: External ear normal.     Left Ear: External ear normal.      Nose: Nose normal.     Mouth/Throat:     Mouth: Mucous membranes are moist.     Pharynx: Oropharynx is clear.  Eyes:     General: No scleral icterus.       Right eye: No discharge.        Left eye: No discharge.     Extraocular Movements: Extraocular movements intact.     Conjunctiva/sclera: Conjunctivae normal.     Pupils: Pupils are equal, round, and reactive to light.  Neck:     Musculoskeletal: Normal range of motion and neck supple.  Cardiovascular:     Rate and Rhythm: Normal rate and regular rhythm.     Pulses: Normal pulses.     Heart sounds: Normal heart sounds. No murmur. No friction rub. No gallop.   Pulmonary:     Effort: Pulmonary effort is normal. No respiratory distress.     Breath sounds: Normal breath sounds. No stridor. No wheezing, rhonchi or rales.  Chest:     Chest wall: No tenderness.  Musculoskeletal: Normal range of motion.  Skin:    General: Skin is warm and dry.     Capillary Refill: Capillary refill takes less than 2 seconds.     Coloration: Skin is not jaundiced or pale.     Findings: No bruising, erythema, lesion or rash.  Neurological:     General: No focal deficit present.     Mental Status: She is alert and oriented to person, place, and time. Mental status is at baseline.  Psychiatric:        Mood and Affect: Mood normal.        Behavior: Behavior normal.        Thought Content: Thought content normal.        Judgment: Judgment normal.     Results for orders placed or performed in visit on 11/12/18  Microscopic Examination   URINE  Result Value Ref Range   WBC, UA None seen 0 - 5 /hpf   RBC 0-2 0 - 2 /hpf   Epithelial Cells (non renal) 0-10 0 - 10 /hpf   Bacteria, UA None seen None seen/Few  CBC with Differential/Platelet  Result Value Ref Range   WBC 4.4 3.4 - 10.8 x10E3/uL   RBC 4.27 3.77 - 5.28 x10E6/uL   Hemoglobin 12.5 11.1 - 15.9 g/dL   Hematocrit 37.7 34.0 - 46.6 %   MCV 88 79 - 97 fL   MCH 29.3 26.6 - 33.0 pg   MCHC 33.2  31.5 - 35.7 g/dL   RDW 12.6 11.7 - 15.4 %   Platelets 174 150 - 450 x10E3/uL   Neutrophils 51 Not Estab. %   Lymphs 40 Not Estab. %   Monocytes 5 Not Estab. %   Eos 4 Not Estab. %   Basos 0 Not Estab. %   Neutrophils Absolute 2.2 1.4 - 7.0 x10E3/uL   Lymphocytes Absolute 1.8 0.7 - 3.1 x10E3/uL   Monocytes Absolute 0.2  0.1 - 0.9 x10E3/uL   EOS (ABSOLUTE) 0.2 0.0 - 0.4 x10E3/uL   Basophils Absolute 0.0 0.0 - 0.2 x10E3/uL   Immature Granulocytes 0 Not Estab. %   Immature Grans (Abs) 0.0 0.0 - 0.1 x10E3/uL  Comprehensive metabolic panel  Result Value Ref Range   Glucose 86 65 - 99 mg/dL   BUN 12 6 - 24 mg/dL   Creatinine, Ser 0.67 0.57 - 1.00 mg/dL   GFR calc non Af Amer 104 >59 mL/min/1.73   GFR calc Af Amer 120 >59 mL/min/1.73   BUN/Creatinine Ratio 18 9 - 23   Sodium 141 134 - 144 mmol/L   Potassium 4.4 3.5 - 5.2 mmol/L   Chloride 102 96 - 106 mmol/L   CO2 26 20 - 29 mmol/L   Calcium 9.3 8.7 - 10.2 mg/dL   Total Protein 6.9 6.0 - 8.5 g/dL   Albumin 4.3 3.8 - 4.8 g/dL   Globulin, Total 2.6 1.5 - 4.5 g/dL   Albumin/Globulin Ratio 1.7 1.2 - 2.2   Bilirubin Total 0.4 0.0 - 1.2 mg/dL   Alkaline Phosphatase 56 39 - 117 IU/L   AST 18 0 - 40 IU/L   ALT 19 0 - 32 IU/L  Lipid Panel w/o Chol/HDL Ratio  Result Value Ref Range   Cholesterol, Total 189 100 - 199 mg/dL   Triglycerides 101 0 - 149 mg/dL   HDL 63 >39 mg/dL   VLDL Cholesterol Cal 20 5 - 40 mg/dL   LDL Calculated 106 (H) 0 - 99 mg/dL  TSH  Result Value Ref Range   TSH 2.050 0.450 - 4.500 uIU/mL  UA/M w/rflx Culture, Routine   Specimen: Urine   URINE  Result Value Ref Range   Specific Gravity, UA 1.010 1.005 - 1.030   pH, UA 6.0 5.0 - 7.5   Color, UA Yellow Yellow   Appearance Ur Clear Clear   Leukocytes,UA Negative Negative   Protein,UA Negative Negative/Trace   Glucose, UA Negative Negative   Ketones, UA Negative Negative   RBC, UA Trace (A) Negative   Bilirubin, UA Negative Negative   Urobilinogen, Ur 0.2  0.2 - 1.0 mg/dL   Nitrite, UA Negative Negative   Microscopic Examination See below:       Assessment & Plan:   Problem List Items Addressed This Visit      Other   Major depression, recurrent ([place]) - Primary    Stable. Did not want to increase her lexapro. Continue current regimen. Continue to monitor. Call with any concerns. Refills given.       Relevant Medications   escitalopram (LEXAPRO) 5 MG tablet    Other Visit Diagnoses    Acute low back pain without sciatica, unspecified back pain laterality       Not getting better with PT. X-ray normal. Will move forward with MRI. Await results. Call with any concerns.    Relevant Orders   MR Lumbar Spine Wo Contrast   Immunization due       Flu shot given.    Relevant Orders   Flu Vaccine QUAD 6+ mos PF IM (Fluarix Quad PF) (Completed)       Follow up plan: Return in about 4 weeks (around 01/13/2019).

## 2018-12-17 ENCOUNTER — Encounter: Payer: Self-pay | Admitting: Family Medicine

## 2018-12-17 NOTE — Assessment & Plan Note (Signed)
Stable. Did not want to increase her lexapro. Continue current regimen. Continue to monitor. Call with any concerns. Refills given.

## 2018-12-30 ENCOUNTER — Other Ambulatory Visit: Payer: Self-pay | Admitting: Family Medicine

## 2019-01-03 ENCOUNTER — Other Ambulatory Visit: Payer: Self-pay

## 2019-01-03 ENCOUNTER — Ambulatory Visit
Admission: RE | Admit: 2019-01-03 | Discharge: 2019-01-03 | Disposition: A | Discharge status: Home / Self Care | Payer: 59 | Source: Ambulatory Visit | Attending: Family Medicine | Admitting: Family Medicine

## 2019-01-03 DIAGNOSIS — M545 Low back pain, unspecified: Secondary | ICD-10-CM

## 2019-01-12 ENCOUNTER — Telehealth: Payer: Self-pay | Admitting: Family Medicine

## 2019-01-12 DIAGNOSIS — M48062 Spinal stenosis, lumbar region with neurogenic claudication: Secondary | ICD-10-CM

## 2019-01-12 NOTE — Telephone Encounter (Signed)
Patient notified

## 2019-01-12 NOTE — Telephone Encounter (Signed)
Please let Karen Walker know that her MRI shows a disc herniation that is pushing on a nerve- it's likely what's causing her pain. I'm going to get her into see the orthopedist for evaluation and to see if they can get her a shot. Thanks.

## 2019-01-14 ENCOUNTER — Ambulatory Visit: Payer: 59 | Admitting: Family Medicine

## 2019-01-14 ENCOUNTER — Other Ambulatory Visit: Payer: Self-pay

## 2019-01-14 ENCOUNTER — Encounter: Payer: Self-pay | Admitting: Family Medicine

## 2019-01-14 VITALS — BP 103/71 | HR 75 | Temp 97.9°F | Ht 64.0 in | Wt 159.0 lb

## 2019-01-14 DIAGNOSIS — F331 Major depressive disorder, recurrent, moderate: Secondary | ICD-10-CM

## 2019-01-14 DIAGNOSIS — M48062 Spinal stenosis, lumbar region with neurogenic claudication: Secondary | ICD-10-CM

## 2019-01-14 MED ORDER — PREMARIN 0.625 MG/GM VA CREA: TOPICAL_CREAM | VAGINAL | 12 refills | Status: DC

## 2019-01-14 MED ORDER — GABAPENTIN 100 MG PO CAPS: 100.0000 mg | ORAL_CAPSULE | Freq: Every day | ORAL | 3 refills | Status: DC

## 2019-01-14 MED ORDER — OMEPRAZOLE 40 MG PO CPDR: 40.0000 mg | DELAYED_RELEASE_CAPSULE | Freq: Every day | ORAL | 1 refills | Status: DC

## 2019-01-14 NOTE — Progress Notes (Signed)
BP 103/71   Pulse 75   Temp 97.9 F (36.6 C) (Oral)   Ht 5\' 4"  (1.626 m)   Wt 159 lb (72.1 kg)   SpO2 97%   BMI 27.29 kg/m    Subjective:    Patient ID: Karen Mess., female    DOB: [birth year], 48 y.o.   MRN: [id number]  HPI: Karen Walker is a 48 y.o. female  Chief Complaint  Patient presents with  . Follow-up  . Depression  . Back Pain   BACK PAIN- had MRI done 2 weeks ago- was referred to orthopedics for spinal stenosis, they called her yesterday, but she doesn't have an appointment yet. She is using a back brace and that has been helping. She continues to be in pain.  Duration: few months Mechanism of injury: no trauma Location: R and low back Onset: sudden Severity: moderate Quality: aching, numb, throbbing Frequency: constant Radiation: R leg below the knee Aggravating factors: laying down, work, walking Alleviating factors: nothing Status: stable Treatments attempted: muscle relaxer, rest, ice, heat, APAP, ibuprofen, aleve, physical therapy and HEP Relief with NSAIDs?: mild Nighttime pain:  yes Paresthesias / decreased sensation:  yes Bowel / bladder incontinence:  no Fevers:  no Dysuria / urinary frequency:  no  DEPRESSION Mood status: exacerbated Satisfied with current treatment?: yes Symptom severity: moderate  Duration of current treatment : months Side effects: no Medication compliance: fair compliance Psychotherapy/counseling: no  Previous psychiatric medications: lexapro Depressed mood: yes Anxious mood: yes Anhedonia: no Significant weight loss or gain: no Insomnia: no  Fatigue: yes Feelings of worthlessness or guilt: yes Impaired concentration/indecisiveness: yes Suicidal ideations: no Hopelessness: no Crying spells: yes Depression screen [facility] 2/9 01/14/2019 12/16/2018 12/16/2018 11/12/2018 02/12/2018  Decreased Interest 2 1 1 2 1   Down, Depressed, Hopeless 2 1 1 2 1   PHQ - 2 Score 4 2 2 4 2   Altered sleeping 2 1 1 3 3   Tired,  decreased energy 2 1 1 3 3   Change in appetite 3 1 1 3 3   Feeling bad or failure about yourself  2 1 1 3 3   Trouble concentrating 2 1 1 3 3   Moving slowly or fidgety/restless 2 2 2 1 3   Suicidal thoughts 2 1 0 0 1  PHQ-9 Score 19 10 9 20 21   Difficult doing work/chores Somewhat difficult Not difficult at all Very difficult Very difficult Very difficult    Relevant past medical, surgical, family and social history reviewed and updated as indicated. Interim medical history since our last visit reviewed. Allergies and medications reviewed and updated.  Review of Systems  Constitutional: Negative.   Respiratory: Negative.   Cardiovascular: Negative.   Musculoskeletal: Positive for back pain and myalgias. Negative for arthralgias, gait problem, joint swelling, neck pain and neck stiffness.  Skin: Negative.   Neurological: Positive for weakness and numbness. Negative for dizziness, tremors, seizures, syncope, facial asymmetry, speech difficulty, light-headedness and headaches.  Psychiatric/Behavioral: Positive for dysphoric mood. Negative for agitation, behavioral problems, confusion, decreased concentration, hallucinations, self-injury, sleep disturbance and suicidal ideas. The patient is nervous/anxious. The patient is not hyperactive.     Per HPI unless specifically indicated above     Objective:    BP 103/71   Pulse 75   Temp 97.9 F (36.6 C) (Oral)   Ht 5\' 4"  (1.626 m)   Wt 159 lb (72.1 kg)   SpO2 97%   BMI 27.29 kg/m   Wt Readings from Last 3 Encounters:  01/14/19 159  lb (72.1 kg)  11/26/18 154 lb (69.9 kg)  11/23/18 158 lb (71.7 kg)    Physical Exam Vitals signs and nursing note reviewed.  Constitutional:      General: She is not in acute distress.    Appearance: Normal appearance. She is not ill-appearing, toxic-appearing or diaphoretic.  HENT:     Head: Normocephalic and atraumatic.     Right Ear: External ear normal.     Left Ear: External ear normal.     Nose:  Nose normal.     Mouth/Throat:     Mouth: Mucous membranes are moist.     Pharynx: Oropharynx is clear.  Eyes:     General: No scleral icterus.       Right eye: No discharge.        Left eye: No discharge.     Extraocular Movements: Extraocular movements intact.     Conjunctiva/sclera: Conjunctivae normal.     Pupils: Pupils are equal, round, and reactive to light.  Neck:     Musculoskeletal: Normal range of motion and neck supple.  Cardiovascular:     Rate and Rhythm: Normal rate and regular rhythm.     Pulses: Normal pulses.     Heart sounds: Normal heart sounds. No murmur. No friction rub. No gallop.   Pulmonary:     Effort: Pulmonary effort is normal. No respiratory distress.     Breath sounds: Normal breath sounds. No stridor. No wheezing, rhonchi or rales.  Chest:     Chest wall: No tenderness.  Musculoskeletal: Normal range of motion.  Skin:    General: Skin is warm and dry.     Capillary Refill: Capillary refill takes less than 2 seconds.     Coloration: Skin is not jaundiced or pale.     Findings: No bruising, erythema, lesion or rash.  Neurological:     General: No focal deficit present.     Mental Status: She is alert and oriented to person, place, and time. Mental status is at baseline.  Psychiatric:        Mood and Affect: Mood normal.        Behavior: Behavior normal.        Thought Content: Thought content normal.        Judgment: Judgment normal.     Results for orders placed or performed in visit on 11/12/18  Microscopic Examination   URINE  Result Value Ref Range   WBC, UA None seen 0 - 5 /hpf   RBC 0-2 0 - 2 /hpf   Epithelial Cells (non renal) 0-10 0 - 10 /hpf   Bacteria, UA None seen None seen/Few  CBC with Differential/Platelet  Result Value Ref Range   WBC 4.4 3.4 - 10.8 x10E3/uL   RBC 4.27 3.77 - 5.28 x10E6/uL   Hemoglobin 12.5 11.1 - 15.9 g/dL   Hematocrit 37.7 34.0 - 46.6 %   MCV 88 79 - 97 fL   MCH 29.3 26.6 - 33.0 pg   MCHC 33.2 31.5 -  35.7 g/dL   RDW 12.6 11.7 - 15.4 %   Platelets 174 150 - 450 x10E3/uL   Neutrophils 51 Not Estab. %   Lymphs 40 Not Estab. %   Monocytes 5 Not Estab. %   Eos 4 Not Estab. %   Basos 0 Not Estab. %   Neutrophils Absolute 2.2 1.4 - 7.0 x10E3/uL   Lymphocytes Absolute 1.8 0.7 - 3.1 x10E3/uL   Monocytes Absolute 0.2 0.1 - 0.9 x10E3/uL  EOS (ABSOLUTE) 0.2 0.0 - 0.4 x10E3/uL   Basophils Absolute 0.0 0.0 - 0.2 x10E3/uL   Immature Granulocytes 0 Not Estab. %   Immature Grans (Abs) 0.0 0.0 - 0.1 x10E3/uL  Comprehensive metabolic panel  Result Value Ref Range   Glucose 86 65 - 99 mg/dL   BUN 12 6 - 24 mg/dL   Creatinine, Ser 0.67 0.57 - 1.00 mg/dL   GFR calc non Af Amer 104 >59 mL/min/1.73   GFR calc Af Amer 120 >59 mL/min/1.73   BUN/Creatinine Ratio 18 9 - 23   Sodium 141 134 - 144 mmol/L   Potassium 4.4 3.5 - 5.2 mmol/L   Chloride 102 96 - 106 mmol/L   CO2 26 20 - 29 mmol/L   Calcium 9.3 8.7 - 10.2 mg/dL   Total Protein 6.9 6.0 - 8.5 g/dL   Albumin 4.3 3.8 - 4.8 g/dL   Globulin, Total 2.6 1.5 - 4.5 g/dL   Albumin/Globulin Ratio 1.7 1.2 - 2.2   Bilirubin Total 0.4 0.0 - 1.2 mg/dL   Alkaline Phosphatase 56 39 - 117 IU/L   AST 18 0 - 40 IU/L   ALT 19 0 - 32 IU/L  Lipid Panel w/o Chol/HDL Ratio  Result Value Ref Range   Cholesterol, Total 189 100 - 199 mg/dL   Triglycerides 101 0 - 149 mg/dL   HDL 63 >39 mg/dL   VLDL Cholesterol Cal 20 5 - 40 mg/dL   LDL Calculated 106 (H) 0 - 99 mg/dL  TSH  Result Value Ref Range   TSH 2.050 0.450 - 4.500 uIU/mL  UA/M w/rflx Culture, Routine   Specimen: Urine   URINE  Result Value Ref Range   Specific Gravity, UA 1.010 1.005 - 1.030   pH, UA 6.0 5.0 - 7.5   Color, UA Yellow Yellow   Appearance Ur Clear Clear   Leukocytes,UA Negative Negative   Protein,UA Negative Negative/Trace   Glucose, UA Negative Negative   Ketones, UA Negative Negative   RBC, UA Trace (A) Negative   Bilirubin, UA Negative Negative   Urobilinogen, Ur 0.2 0.2 - 1.0  mg/dL   Nitrite, UA Negative Negative   Microscopic Examination See below:       Assessment & Plan:   Problem List Items Addressed This Visit      Other   Major depression, recurrent ([place])    Not interested in changing medicine. Not feeling great. Continue to monitor closely. Call with any concerns.       Spinal stenosis of lumbar region with neurogenic claudication - Primary    To see orthopedics. Will start gabapentin to help with pain. Recheck 1 month. Call with any concerns.       Relevant Medications   gabapentin (NEURONTIN) 100 MG capsule       Follow up plan: Return in about 4 weeks (around 02/11/2019).

## 2019-01-15 ENCOUNTER — Encounter: Payer: Self-pay | Admitting: Family Medicine

## 2019-01-15 DIAGNOSIS — M48062 Spinal stenosis, lumbar region with neurogenic claudication: Secondary | ICD-10-CM | POA: Insufficient documentation

## 2019-01-15 NOTE — Assessment & Plan Note (Signed)
Not interested in changing medicine. Not feeling great. Continue to monitor closely. Call with any concerns.

## 2019-01-15 NOTE — Assessment & Plan Note (Signed)
To see orthopedics. Will start gabapentin to help with pain. Recheck 1 month. Call with any concerns.

## 2019-01-20 DIAGNOSIS — M5416 Radiculopathy, lumbar region: Secondary | ICD-10-CM | POA: Diagnosis not present

## 2019-02-02 DIAGNOSIS — M5416 Radiculopathy, lumbar region: Secondary | ICD-10-CM | POA: Diagnosis not present

## 2019-02-21 ENCOUNTER — Ambulatory Visit: Payer: 59 | Admitting: Family Medicine

## 2019-02-25 ENCOUNTER — Encounter: Payer: Self-pay | Admitting: Family Medicine

## 2019-02-25 ENCOUNTER — Other Ambulatory Visit: Payer: Self-pay

## 2019-02-25 ENCOUNTER — Ambulatory Visit (INDEPENDENT_AMBULATORY_CARE_PROVIDER_SITE_OTHER): Payer: 59 | Admitting: Family Medicine

## 2019-02-25 DIAGNOSIS — Z20822 Contact with and (suspected) exposure to covid-19: Secondary | ICD-10-CM

## 2019-02-25 DIAGNOSIS — M48062 Spinal stenosis, lumbar region with neurogenic claudication: Secondary | ICD-10-CM

## 2019-02-25 NOTE — Progress Notes (Signed)
There were no vitals taken for this visit.   Subjective:    Patient ID: Karen Mess., female    DOB: [birth year], 48 y.o.   MRN: [id number]  HPI: Karen Walker is a 48 y.o. female  Chief Complaint  Patient presents with  . Back Pain    Relevant past medical, surgical, family and social history reviewed and updated as indicated. Interim medical history since our last visit reviewed. Allergies and medications reviewed and updated.  Review of Systems  Per HPI unless specifically indicated above     Objective:    There were no vitals taken for this visit.  Wt Readings from Last 3 Encounters:  01/14/19 159 lb (72.1 kg)  11/26/18 154 lb (69.9 kg)  11/23/18 158 lb (71.7 kg)    Physical Exam  Results for orders placed or performed in visit on 11/12/18  Microscopic Examination   URINE  Result Value Ref Range   WBC, UA None seen 0 - 5 /hpf   RBC 0-2 0 - 2 /hpf   Epithelial Cells (non renal) 0-10 0 - 10 /hpf   Bacteria, UA None seen None seen/Few  CBC with Differential/Platelet  Result Value Ref Range   WBC 4.4 3.4 - 10.8 x10E3/uL   RBC 4.27 3.77 - 5.28 x10E6/uL   Hemoglobin 12.5 11.1 - 15.9 g/dL   Hematocrit 37.7 34.0 - 46.6 %   MCV 88 79 - 97 fL   MCH 29.3 26.6 - 33.0 pg   MCHC 33.2 31.5 - 35.7 g/dL   RDW 12.6 11.7 - 15.4 %   Platelets 174 150 - 450 x10E3/uL   Neutrophils 51 Not Estab. %   Lymphs 40 Not Estab. %   Monocytes 5 Not Estab. %   Eos 4 Not Estab. %   Basos 0 Not Estab. %   Neutrophils Absolute 2.2 1.4 - 7.0 x10E3/uL   Lymphocytes Absolute 1.8 0.7 - 3.1 x10E3/uL   Monocytes Absolute 0.2 0.1 - 0.9 x10E3/uL   EOS (ABSOLUTE) 0.2 0.0 - 0.4 x10E3/uL   Basophils Absolute 0.0 0.0 - 0.2 x10E3/uL   Immature Granulocytes 0 Not Estab. %   Immature Grans (Abs) 0.0 0.0 - 0.1 x10E3/uL  Comprehensive metabolic panel  Result Value Ref Range   Glucose 86 65 - 99 mg/dL   BUN 12 6 - 24 mg/dL   Creatinine, Ser 0.67 0.57 - 1.00 mg/dL   GFR calc non Af Amer 104 >59  mL/min/1.73   GFR calc Af Amer 120 >59 mL/min/1.73   BUN/Creatinine Ratio 18 9 - 23   Sodium 141 134 - 144 mmol/L   Potassium 4.4 3.5 - 5.2 mmol/L   Chloride 102 96 - 106 mmol/L   CO2 26 20 - 29 mmol/L   Calcium 9.3 8.7 - 10.2 mg/dL   Total Protein 6.9 6.0 - 8.5 g/dL   Albumin 4.3 3.8 - 4.8 g/dL   Globulin, Total 2.6 1.5 - 4.5 g/dL   Albumin/Globulin Ratio 1.7 1.2 - 2.2   Bilirubin Total 0.4 0.0 - 1.2 mg/dL   Alkaline Phosphatase 56 39 - 117 IU/L   AST 18 0 - 40 IU/L   ALT 19 0 - 32 IU/L  Lipid Panel w/o Chol/HDL Ratio  Result Value Ref Range   Cholesterol, Total 189 100 - 199 mg/dL   Triglycerides 101 0 - 149 mg/dL   HDL 63 >39 mg/dL   VLDL Cholesterol Cal 20 5 - 40 mg/dL   LDL Calculated 106 (H) 0 -  99 mg/dL  TSH  Result Value Ref Range   TSH 2.050 0.450 - 4.500 uIU/mL  UA/M w/rflx Culture, Routine   Specimen: Urine   URINE  Result Value Ref Range   Specific Gravity, UA 1.010 1.005 - 1.030   pH, UA 6.0 5.0 - 7.5   Color, UA Yellow Yellow   Appearance Ur Clear Clear   Leukocytes,UA Negative Negative   Protein,UA Negative Negative/Trace   Glucose, UA Negative Negative   Ketones, UA Negative Negative   RBC, UA Trace (A) Negative   Bilirubin, UA Negative Negative   Urobilinogen, Ur 0.2 0.2 - 1.0 mg/dL   Nitrite, UA Negative Negative   Microscopic Examination See below:       Assessment & Plan:   Problem List Items Addressed This Visit    None       Follow up plan: No follow-ups on file.

## 2019-02-25 NOTE — Progress Notes (Signed)
Called patient 3x and she did not answer the phone.

## 2019-02-27 ENCOUNTER — Telehealth: Payer: Self-pay

## 2019-02-27 NOTE — Telephone Encounter (Signed)
Pt called to get covid results- Advised results are not back.

## 2019-02-28 ENCOUNTER — Telehealth: Payer: 59 | Admitting: Nurse Practitioner

## 2019-02-28 ENCOUNTER — Encounter: Payer: Self-pay | Admitting: Nurse Practitioner

## 2019-02-28 ENCOUNTER — Ambulatory Visit (INDEPENDENT_AMBULATORY_CARE_PROVIDER_SITE_OTHER): Payer: 59 | Admitting: Nurse Practitioner

## 2019-02-28 ENCOUNTER — Other Ambulatory Visit: Payer: Self-pay

## 2019-02-28 DIAGNOSIS — Z20828 Contact with and (suspected) exposure to other viral communicable diseases: Secondary | ICD-10-CM | POA: Diagnosis not present

## 2019-02-28 DIAGNOSIS — Z20822 Contact with and (suspected) exposure to covid-19: Secondary | ICD-10-CM | POA: Insufficient documentation

## 2019-02-28 LAB — NOVEL CORONAVIRUS, NAA: SARS-CoV-2, NAA: NOT DETECTED

## 2019-02-28 MED ORDER — FLUTICASONE PROPIONATE 50 MCG/ACT NA SUSP: 2.0000 | Freq: Every day | NASAL | 6 refills | Status: DC

## 2019-02-28 MED ORDER — ALBUTEROL SULFATE HFA 108 (90 BASE) MCG/ACT IN AERS: 2.0000 | INHALATION_SPRAY | Freq: Four times a day (QID) | RESPIRATORY_TRACT | 1 refills | Status: DC | PRN

## 2019-02-28 MED ORDER — BENZONATATE 100 MG PO CAPS: 100.0000 mg | ORAL_CAPSULE | Freq: Three times a day (TID) | ORAL | 1 refills | Status: DC | PRN

## 2019-02-28 NOTE — Progress Notes (Signed)
There were no vitals taken for this visit.   Subjective:    Patient ID: Karen Mess., female    DOB: 11/27/1970, 48 y.o.   MRN: [id number]  HPI: Karen Walker is a 48 y.o. female  Chief Complaint  Patient presents with  . URI    pt states she has had a fever, chills, and cough since this weekend. States she had negative COVID on Friday, health dept is having her go retest tomorrow    . This visit was completed via FaceTime due to the restrictions of the COVID-19 pandemic. All issues as above were discussed and addressed. Physical exam was done as above through visual confirmation on Facetime. If it was felt that the patient should be evaluated in the office, they were directed there. The patient verbally consented to this visit. . Location of the patient: home . Location of the provider: work . Those involved with this call:  . Provider: Marnee Guarneri, DNP . CMA: Yvonna Alanis, CMA . Front Desk/Registration: Jill Side  . Time spent on call: 15 minutes with patient face to face via video conference. More than 50% of this time was spent in counseling and coordination of care. 10 minutes total spent in review of patient's record and preparation of their chart.  . I verified patient identity using two factors (patient name and date of birth). Patient consents verbally to being seen via telemedicine visit today.    UPPER RESPIRATORY TRACT INFECTION Started having symptoms 02/25/2019 with sore throat and Covid returned negative. Her daughter, son, and grand daughter all have tested positive yesterday.  Patient is retesting tomorrow.  Denies loss of taste or smell.  She is self quarantining.  Works for [employer] and is not going to work. Fever: yes, this morning 102.0, but Tylenol brings to 99.7 Cough: yes Shortness of breath: sometimes Wheezing: no Chest pain: no Chest tightness: yes Chest congestion: no Nasal congestion: yes Runny nose: yes Post nasal drip: yes  Sneezing: no Sore throat: yes Swollen glands: no Sinus pressure: no Headache: no Face pain: no Toothache: no Ear pain: none Ear pressure: none Eyes red/itching:no Eye drainage/crusting: no  Vomiting: no Rash: no Fatigue: yes Sick contacts: yes Strep contacts: no  Context: fluctuating Recurrent sinusitis: no Relief with OTC cold/cough medications: yes  Treatments attempted: cold/sinus and cough syrup   Relevant past medical, surgical, family and social history reviewed and updated as indicated. Interim medical history since our last visit reviewed. Allergies and medications reviewed and updated.  Review of Systems  Constitutional: Positive for fatigue and fever. Negative for activity change, appetite change, chills and diaphoresis.  HENT: Positive for congestion, postnasal drip, rhinorrhea, sinus pressure and sore throat. Negative for ear discharge, ear pain, facial swelling, sinus pain, sneezing and voice change.   Eyes: Negative for pain and visual disturbance.  Respiratory: Positive for cough and shortness of breath (sometimes). Negative for chest tightness and wheezing.   Cardiovascular: Negative for chest pain, palpitations and leg swelling.  Gastrointestinal: Negative for abdominal distention, abdominal pain, constipation, diarrhea, nausea and vomiting.  Endocrine: Negative.   Musculoskeletal: Positive for myalgias.  Neurological: Negative for dizziness, numbness and headaches.  Psychiatric/Behavioral: Negative.     Per HPI unless specifically indicated above     Objective:    There were no vitals taken for this visit.  Wt Readings from Last 3 Encounters:  01/14/19 159 lb (72.1 kg)  11/26/18 154 lb (69.9 kg)  11/23/18 158 lb (71.7 kg)  Physical Exam Vitals signs and nursing note reviewed.  Constitutional:      General: She is awake. She is not in acute distress.    Appearance: She is well-developed. She is ill-appearing.  HENT:     Head: Normocephalic.      Right Ear: Hearing normal.     Left Ear: Hearing normal.     Nose: Rhinorrhea present.     Mouth/Throat:     Pharynx: Posterior oropharyngeal erythema (mild with cobblestoning) present. No pharyngeal swelling or oropharyngeal exudate.     Comments: Viewed via virtual. Eyes:     General: Lids are normal.        Right eye: No discharge.        Left eye: No discharge.     Conjunctiva/sclera: Conjunctivae normal.  Neck:     Musculoskeletal: Normal range of motion.  Pulmonary:     Effort: Pulmonary effort is normal. No accessory muscle usage or respiratory distress.     Comments: Unable to auscultate due to virtual exam only.  No SOB with talking and no cough noted during exam period. Neurological:     Mental Status: She is alert and oriented to person, place, and time.  Psychiatric:        Attention and Perception: Attention normal.        Mood and Affect: Mood normal.        Behavior: Behavior normal. Behavior is cooperative.        Thought Content: Thought content normal.        Judgment: Judgment normal.     Results for orders placed or performed in visit on 02/25/19  Novel Coronavirus, NAA ([facility])   Specimen: Nasopharyngeal(NP) swabs in vial transport medium   NASOPHARYNGE  TESTING  Result Value Ref Range   SARS-CoV-2, NAA Not Detected Not Detected      Assessment & Plan:   Problem List Items Addressed This Visit      Other   Close exposure to COVID-19 virus    Exposed to positive family members in home.  Is retesting tomorrow and aware to self quarantine at this time and until test results return.  If positive is aware to continue quarantine and if negative continue quarantine until symptom improvement and no fever x 3 days.  Scripts for [employer] and Albuterol sent.  Recommend use Flonase at home as needed.  Discussed use of OTC medications for symptoms management: sinus medications and cough syrup as needed.  Drink plenty of fluid and get plenty of rest.  Continue  quarantine until instructed otherwise by HD.  Return in 2 weeks for follow-up, if worsening symptoms such as SOB, CP, fever then immediately go to ER for evaluation.         I discussed the assessment and treatment plan with the patient. The patient was provided an opportunity to ask questions and all were answered. The patient agreed with the plan and demonstrated an understanding of the instructions.   The patient was advised to call back or seek an in-person evaluation if the symptoms worsen or if the condition fails to improve as anticipated.   I provided 15 minutes of time during this encounter.  Follow up plan: Return in about 2 weeks (around 03/14/2019) for Covid follow-up.

## 2019-02-28 NOTE — Patient Instructions (Signed)

## 2019-02-28 NOTE — Assessment & Plan Note (Signed)
Exposed to positive family members in home.  Is retesting tomorrow and aware to self quarantine at this time and until test results return.  If positive is aware to continue quarantine and if negative continue quarantine until symptom improvement and no fever x 3 days.  Scripts for [employer] and Albuterol sent.  Recommend use Flonase at home as needed.  Discussed use of OTC medications for symptoms management: sinus medications and cough syrup as needed.  Drink plenty of fluid and get plenty of rest.  Continue quarantine until instructed otherwise by HD.  Return in 2 weeks for follow-up, if worsening symptoms such as SOB, CP, fever then immediately go to ER for evaluation.

## 2019-03-09 ENCOUNTER — Encounter: Payer: Self-pay | Admitting: Family Medicine

## 2019-03-09 ENCOUNTER — Other Ambulatory Visit: Payer: Self-pay

## 2019-03-09 ENCOUNTER — Ambulatory Visit (INDEPENDENT_AMBULATORY_CARE_PROVIDER_SITE_OTHER): Payer: 59 | Admitting: Family Medicine

## 2019-03-09 DIAGNOSIS — J069 Acute upper respiratory infection, unspecified: Secondary | ICD-10-CM

## 2019-03-09 DIAGNOSIS — U071 COVID-19: Secondary | ICD-10-CM | POA: Diagnosis not present

## 2019-03-09 MED ORDER — AMOXICILLIN-POT CLAVULANATE 875-125 MG PO TABS: 1.0000 | ORAL_TABLET | Freq: Two times a day (BID) | ORAL | 0 refills | Status: DC

## 2019-03-09 NOTE — Progress Notes (Signed)
There were no vitals taken for this visit.   Subjective:    Patient ID: Karen Walker., female    DOB: [birth year], 48 y.o.   MRN: [id number]  HPI: [name] is a 48 y.o. female  Chief Complaint  Patient presents with  . URI    . This visit was completed via WebEx due to the restrictions of the COVID-19 pandemic. All issues as above were discussed and addressed. Physical exam was done as above through visual confirmation on WebEx. If it was felt that the patient should be evaluated in the office, they were directed there. The patient verbally consented to this visit. . Location of the patient: home . Location of the provider: work . Those involved with this call:  . Provider: Merrie Roof, PA-C . CMA: Lesle Chris, [place] . Front Desk/Registration: Jill Side  . Time spent on call: 15 minutes with patient face to face via video conference. More than 50% of this time was spent in counseling and coordination of care. 5 minutes total spent in review of patient's record and preparation of their chart. I verified patient identity using two factors (patient name and date of birth). Patient consents verbally to being seen via telemedicine visit today.   Headache at forehead and behind eyes, cough, fever (last week), sore throat, anorexia, upper abdominal pain for about 2 weeks now. Sxs waxing and waning. COVID positive last week. Has been taking theraflu and aspirin, hot tea as needed. Started taking nasal spray a day or so ago which has helped some. Denies CP, SOB, N/V/D.   Relevant past medical, surgical, family and social history reviewed and updated as indicated. Interim medical history since our last visit reviewed. Allergies and medications reviewed and updated.  Review of Systems  Per HPI unless specifically indicated above     Objective:    There were no vitals taken for this visit.  Wt Readings from Last 3 Encounters:  01/14/19 159 lb (72.1 kg)  11/26/18 154 lb (69.9  kg)  11/23/18 158 lb (71.7 kg)    Physical Exam Vitals signs and nursing note reviewed.  Constitutional:      General: She is not in acute distress.    Appearance: Normal appearance.  HENT:     Head: Atraumatic.     Right Ear: External ear normal.     Left Ear: External ear normal.     Nose: Congestion present.     Mouth/Throat:     Mouth: Mucous membranes are moist.     Pharynx: Oropharynx is clear. Posterior oropharyngeal erythema present.  Eyes:     Extraocular Movements: Extraocular movements intact.     Conjunctiva/sclera: Conjunctivae normal.  Neck:     Musculoskeletal: Normal range of motion.  Cardiovascular:     Comments: Unable to assess via virtual visit Pulmonary:     Effort: Pulmonary effort is normal. No respiratory distress.  Musculoskeletal: Normal range of motion.  Skin:    General: Skin is dry.     Findings: No erythema.  Neurological:     Mental Status: She is alert and oriented to person, place, and time.  Psychiatric:        Mood and Affect: Mood normal.        Thought Content: Thought content normal.        Judgment: Judgment normal.     Results for orders placed or performed in visit on 02/25/19  Novel Coronavirus, NAA ([facility])   Specimen: Nasopharyngeal(NP) swabs in  vial transport medium   NASOPHARYNGE  TESTING  Result Value Ref Range   SARS-CoV-2, NAA Not Detected Not Detected      Assessment & Plan:   Problem List Items Addressed This Visit    None    Visit Diagnoses    COVID-19 virus infection    -  Primary   Improving overall but with continued URI sxs despite good supportive care. Continue quarantine, add abx and further supportive care   Upper respiratory tract infection, unspecified type       Tx with augmentin, tessalon, albuterol inhaler prn and allergy regimen. Return precautions given       Follow up plan: Return if symptoms worsen or fail to improve.

## 2019-05-03 ENCOUNTER — Encounter: Payer: Self-pay | Admitting: Family Medicine

## 2019-05-03 ENCOUNTER — Other Ambulatory Visit: Payer: Self-pay

## 2019-05-03 ENCOUNTER — Ambulatory Visit (INDEPENDENT_AMBULATORY_CARE_PROVIDER_SITE_OTHER): Payer: 59 | Admitting: Family Medicine

## 2019-05-03 VITALS — BP 100/67 | HR 85 | Temp 98.4°F

## 2019-05-03 DIAGNOSIS — L259 Unspecified contact dermatitis, unspecified cause: Secondary | ICD-10-CM

## 2019-05-03 MED ORDER — TRIAMCINOLONE ACETONIDE 0.5 % EX OINT: 1.0000 "application " | TOPICAL_OINTMENT | Freq: Two times a day (BID) | CUTANEOUS | 3 refills | Status: DC

## 2019-05-03 NOTE — Patient Instructions (Signed)
Dermatitis de contacto Contact Dermatitis La dermatitis es el enrojecimiento, el dolor y la hinchazn (inflamacin) de la piel. La dermatitis de contacto es una reaccin a algo que toca la piel. Hay dos tipos de dermatitis de contacto:  Dermatitis de contacto irritativa. Esto ocurre cuando algo molesta (irrita) la piel, Arab.  Dermatitis de contacto alrgica. Esto se produce cuando una persona se expone a algo a lo que es IT consultant, como la hiedra venenosa. Cules son las causas?  Las causas frecuentes de la dermatitis de contacto irritante incluyen las siguientes: ? Maquillaje. ? Jabones. ? Detergentes. ? Lavandina. ? cidos. ? Metales, como el nquel.  Las causas frecuentes de la dermatitis de contacto alrgica incluyen las siguientes: ? Plantas. ? Productos qumicos. ? Alhajas. ? Ltex. ? Medicamentos. ? Conservantes que se utilizan en determinados productos, como la ropa. Qu incrementa el riesgo?  Tener un trabajo que lo expone a cosas que causan molestias en la piel.  Tener asma o eczema. Cules son los signos o los sntomas? Los sntomas pueden ocurrir en cualquier parte de la piel que la sustancia irritante haya tocado. Algunos de los sntomas son los siguientes:  Piel seca o descamada.  Enrojecimiento.  Grietas.  Picazn.  Dolor o sensacin de ardor.  Ampollas.  Sangre o lquido transparente que drena de las grietas de la piel. En el caso de la dermatitis de Risk manager, puede haber hinchazn. Esto puede ocurrir en [employer] prpados, la boca o los genitales. Cmo se trata?  Esta afeccin se trata mediante un control para detectar la causa de la reaccin y proteger la piel. El tratamiento tambin puede incluir lo siguiente: ? Ungentos, medicamentos o cremas con corticoesteroides. ? Antibiticos u otros ungentos, si tiene una infeccin en la piel. ? Lociones o medicamentos para Barrister's clerk. ? Una venda (vendaje). Siga  estas indicaciones en su casa: Cuidado de la piel  Humctese la piel segn sea necesario.  Aplique compresas fras sobre la piel.  Pngase una pasta de bicarbonato de [employer]. Agregue agua al bicarbonato de sodio hasta que parezca una pasta.  No se rasque la piel.  Evite que las cosas le rocen la piel.  Evite el uso de [place], perfumes y tintes. [place] o aplique los medicamentos de venta libre y los recetados solamente como se lo haya indicado el mdico.  Si le recetaron un antibitico, tmelo o aplquelo como se lo haya indicado el mdico. No deje de usarlo aunque la afeccin empiece a mejorar. Baarse  Tome un bao con: ? Sales de [place]. ? Bicarbonato de sodio. ? Avena coloidal.  Bese con menos frecuencia.  Bese con agua tibia. No use agua caliente. Cuidado de la venda  Si le colocaron una venda, [place] como se lo haya indicado el mdico.  [place] las manos con agua y [place] antes y despus de cambiarse la venda. Use desinfectante para manos si no dispone de [place] y [place]. Indicaciones generales  Evite las cosas que le causaron la reaccin. Si no sabe qu la caus, lleve un diario. Escriba los siguientes datos: ? Lo que come. ? Los productos para la piel que [place]. ? Lo que bebe. ? Lo que lleva puesto en la zona que tiene los sntomas. [place] alhajas.  Controle todos los das las zonas afectadas para detectar signos de infeccin. Est atento a los siguientes signos: ? Aumento del enrojecimiento, la hinchazn o Conservation officer, historic buildings. ? Ms lquido Delorise Shiner. ?  Calor. ? Pus o mal olor.  Concurra a todas las visitas de seguimiento como se lo haya indicado el mdico. Esto es importante. Comunquese con un mdico si:  No mejora con el tratamiento.  Su afeccin empeora.  Tiene signos de infeccin, como los siguientes: ? Aumento de la hinchazn. ? Dolor a Secretary/administrator. ? Aumento del enrojecimiento. ? Molestias. ? Calor.  Tiene  fiebre.  Aparecen nuevos sntomas. Solicite ayuda inmediatamente si:  Tiene un dolor de cabeza muy intenso.  Siente dolor en el cuello.  Tiene el cuello rgido.  Vomita.  Se siente muy somnoliento.  Nota unas lneas rojas en la piel que salen de la zona.  El hueso o la articulacin que se encuentran cerca de la zona le duelen despus de que la piel se [place].  La zona se oscurece.  Tiene dificultad para respirar. Resumen  La dermatitis es el enrojecimiento, el dolor y la hinchazn de la piel.  Los sntomas pueden ocurrir en donde la sustancia irritante lo ha tocado.  El tratamiento puede incluir medicamentos y cuidado de la piel.  Si no conoce la causa de Production designer, theatre/television/film, lleve un diario.  Comunquese con un mdico si su afeccin empeora o si tiene signos de infeccin. Esta informacin no tiene Marine scientist el consejo del mdico. Asegrese de hacerle al mdico cualquier pregunta que tenga. Document Revised: 11/25/2017 Document Reviewed: 11/25/2017 Elsevier Patient Education  2020 [employer].

## 2019-05-03 NOTE — Progress Notes (Signed)
BP 100/67 (BP Location: Right Arm, Patient Position: Sitting, Cuff Size: Normal)   Pulse 85   Temp 98.4 F (36.9 C) (Oral)   SpO2 98%    Subjective:    Patient ID: Karen Mess., female    DOB: November 26, 1970, 49 y.o.   MRN: [id number]  HPI: Karen Walker is a 49 y.o. female  Chief Complaint  Patient presents with  . Rash   RASH Duration:  About 3 weeks  Location: legs and belly  Itching: yes Burning: yes Redness: yes Oozing: no Scaling: no Blisters: no Painful: no Fevers: no Change in detergents/soaps/personal care products: no Recent illness: no Recent travel:no History of same: no Context: stable Alleviating factors: nothing Treatments attempted:lotion/moisturizer Shortness of breath: no  Throat/tongue swelling: no Myalgias/arthralgias: no  Relevant past medical, surgical, family and social history reviewed and updated as indicated. Interim medical history since our last visit reviewed. Allergies and medications reviewed and updated.  Review of Systems  Constitutional: Negative.   Respiratory: Negative.   Cardiovascular: Negative.   Musculoskeletal: Negative.   Skin: Positive for rash. Negative for color change, pallor and wound.  Psychiatric/Behavioral: Negative.     Per HPI unless specifically indicated above     Objective:    BP 100/67 (BP Location: Right Arm, Patient Position: Sitting, Cuff Size: Normal)   Pulse 85   Temp 98.4 F (36.9 C) (Oral)   SpO2 98%   Wt Readings from Last 3 Encounters:  01/14/19 159 lb (72.1 kg)  11/26/18 154 lb (69.9 kg)  11/23/18 158 lb (71.7 kg)    Physical Exam Vitals and nursing note reviewed.  Constitutional:      General: She is not in acute distress.    Appearance: Normal appearance. She is not ill-appearing, toxic-appearing or diaphoretic.  HENT:     Head: Normocephalic and atraumatic.     Right Ear: External ear normal.     Left Ear: External ear normal.     Nose: Nose normal.     Mouth/Throat:   Mouth: Mucous membranes are moist.     Pharynx: Oropharynx is clear.  Eyes:     General: No scleral icterus.       Right eye: No discharge.        Left eye: No discharge.     Extraocular Movements: Extraocular movements intact.     Conjunctiva/sclera: Conjunctivae normal.     Pupils: Pupils are equal, round, and reactive to light.  Cardiovascular:     Rate and Rhythm: Normal rate and regular rhythm.     Pulses: Normal pulses.     Heart sounds: Normal heart sounds. No murmur. No friction rub. No gallop.   Pulmonary:     Effort: Pulmonary effort is normal. No respiratory distress.     Breath sounds: Normal breath sounds. No stridor. No wheezing, rhonchi or rales.  Chest:     Chest wall: No tenderness.  Musculoskeletal:        General: Normal range of motion.     Cervical back: Normal range of motion and neck supple.  Skin:    General: Skin is warm and dry.     Capillary Refill: Capillary refill takes less than 2 seconds.     Coloration: Skin is not jaundiced or pale.     Findings: Rash present. No bruising, erythema or lesion.     Comments: Line of erythematous raised skin on medial L leg and on belly   Neurological:     General: No  focal deficit present.     Mental Status: She is alert and oriented to person, place, and time. Mental status is at baseline.  Psychiatric:        Mood and Affect: Mood normal.        Behavior: Behavior normal.        Thought Content: Thought content normal.        Judgment: Judgment normal.     Results for orders placed or performed in visit on 02/25/19  Novel Coronavirus, NAA ([facility])   Specimen: Nasopharyngeal(NP) swabs in vial transport medium   NASOPHARYNGE  TESTING  Result Value Ref Range   SARS-CoV-2, NAA Not Detected Not Detected      Assessment & Plan:   Problem List Items Addressed This Visit      Musculoskeletal and Integument   Contact dermatitis - Primary    Will treat with triamcinalone. Call with any concerns or if not  getting better.          Follow up plan: Return March, for 6 month follow up.

## 2019-05-07 ENCOUNTER — Encounter: Payer: Self-pay | Admitting: Family Medicine

## 2019-05-07 NOTE — Assessment & Plan Note (Signed)
Will treat with triamcinalone. Call with any concerns or if not getting better.  

## 2019-05-25 ENCOUNTER — Other Ambulatory Visit: Payer: Self-pay | Admitting: Family Medicine

## 2019-05-25 NOTE — Telephone Encounter (Signed)
Routing to provider  

## 2019-05-25 NOTE — Telephone Encounter (Signed)
Medication Refill - Medication: cyclobenzaprine (FLEXERIL) 10 MG tablet    Has the patient contacted their pharmacy? Yes.   (Agent: If no, request that the patient contact the pharmacy for the refill.) (Agent: If yes, when and what did the pharmacy advise?)  Preferred Pharmacy (with phone number or street name):  [facility], [address]  [place] [address]  Phone: [phone] Fax: [phone]     Agent: Please be advised that RX refills may take up to 3 business days. We ask that you follow-up with your pharmacy.

## 2019-05-25 NOTE — Telephone Encounter (Signed)
Requested medication (s) are due for refill today: yes  Requested medication (s) are on the active medication list: yes  Last refill:  09/16/18  Future visit scheduled:no  Notes to clinic: Not delegated    Requested Prescriptions  Pending Prescriptions Disp Refills   cyclobenzaprine (FLEXERIL) 10 MG tablet 30 tablet 0    Sig: Take 1 tablet (10 mg total) by mouth at bedtime.      Not Delegated - Analgesics:  Muscle Relaxants Failed - 05/25/2019  4:29 PM      Failed - This refill cannot be delegated      Passed - Valid encounter within last 6 months    Recent Outpatient Visits           3 weeks ago Contact dermatitis, unspecified contact dermatitis type, unspecified trigger   [place], Megan P, DO   2 months ago COVID-19 virus infection   [facility], [place], [place]   2 months ago Close exposure to COVID-19 virus   [place], [place] T, NP   2 months ago Spinal stenosis of lumbar region with neurogenic claudication   [place], DO   4 months ago Spinal stenosis of lumbar region with neurogenic claudication   [facility], Megan P, DO

## 2019-05-26 MED ORDER — CYCLOBENZAPRINE HCL 10 MG PO TABS: 10.0000 mg | ORAL_TABLET | Freq: Every day | ORAL | 0 refills | Status: DC

## 2019-07-05 ENCOUNTER — Encounter: Payer: Self-pay | Admitting: Family Medicine

## 2019-07-05 ENCOUNTER — Telehealth (INDEPENDENT_AMBULATORY_CARE_PROVIDER_SITE_OTHER): Payer: 59 | Admitting: Family Medicine

## 2019-07-05 ENCOUNTER — Ambulatory Visit: Payer: 59 | Admitting: Family Medicine

## 2019-07-05 DIAGNOSIS — J069 Acute upper respiratory infection, unspecified: Secondary | ICD-10-CM

## 2019-07-05 MED ORDER — PREDNISONE 50 MG PO TABS: 50.0000 mg | ORAL_TABLET | Freq: Every day | ORAL | 0 refills | Status: DC

## 2019-07-05 NOTE — Progress Notes (Signed)
There were no vitals taken for this visit.   Subjective:    Patient ID: Karen Walker., female    DOB: [birth year], 49 y.o.   MRN: [id number]  HPI: Jonea Gruwell is a 49 y.o. female  Chief Complaint  Patient presents with  . Sinus Problem    Head pain, face and throat yesterday, today only the headache. Weak and shaky. COVID vaccine on 06/24/19  . Diarrhea    vomiting yesterday.    UPPER RESPIRATORY TRACT INFECTION- got the COVID shot 11 days, ago, started feeling sick 5 days after getting her shot Duration: yesterday Worst symptom: throwing up, diarrhea,  Fever: no Cough: yes Shortness of breath: yes Wheezing: yes Chest pain: yes Chest tightness: yes Chest congestion: yes Nasal congestion: yes Runny nose: no Post nasal drip: yes Sneezing: no Sore throat: yes Swollen glands: yes Sinus pressure: yes Headache: yes Face pain: yes Toothache: no Ear pain: yes bilateral Ear pressure: yes bilateral Eyes red/itching:yes Eye drainage/crusting: yes  Vomiting: yes Rash: no Fatigue: yes Sick contacts: no Strep contacts: no  Context: worse Recurrent sinusitis: no Relief with OTC cold/cough medications: no  Treatments attempted: advil   Relevant past medical, surgical, family and social history reviewed and updated as indicated. Interim medical history since our last visit reviewed. Allergies and medications reviewed and updated.  Review of Systems  Constitutional: Positive for chills, fatigue and fever. Negative for activity change, appetite change, diaphoresis and unexpected weight change.  HENT: Positive for congestion, postnasal drip, rhinorrhea and sinus pressure. Negative for dental problem, drooling, ear discharge, ear pain, facial swelling, hearing loss, mouth sores, nosebleeds, sinus pain, sneezing, sore throat, tinnitus, trouble swallowing and voice change.   Eyes: Negative.   Respiratory: Negative.   Cardiovascular: Negative.   Gastrointestinal: Negative.     Musculoskeletal: Negative.   Neurological: Negative.   Psychiatric/Behavioral: Negative.     Per HPI unless specifically indicated above     Objective:    There were no vitals taken for this visit.  Wt Readings from Last 3 Encounters:  01/14/19 159 lb (72.1 kg)  11/26/18 154 lb (69.9 kg)  11/23/18 158 lb (71.7 kg)    Physical Exam Vitals and nursing note reviewed.  Pulmonary:     Effort: Pulmonary effort is normal. No respiratory distress.     Comments: Speaking in full sentences Neurological:     Mental Status: She is alert.  Psychiatric:        Mood and Affect: Mood normal.        Behavior: Behavior normal.        Thought Content: Thought content normal.        Judgment: Judgment normal.     Results for orders placed or performed in visit on 02/25/19  Novel Coronavirus, NAA ([facility])   Specimen: Nasopharyngeal(NP) swabs in vial transport medium   NASOPHARYNGE  TESTING  Result Value Ref Range   SARS-CoV-2, NAA Not Detected Not Detected      Assessment & Plan:   Problem List Items Addressed This Visit    None    Visit Diagnoses    Upper respiratory tract infection, unspecified type    -  Primary   Will get her COVID tested- await results. Self-quarantine until results are back. Will treat with prednisone for comfort. If not better by Friday, will add abx.   Relevant Orders   Novel Coronavirus, NAA ([facility])       Follow up plan: Return if symptoms worsen or fail  to improve.    . This visit was completed via telephone due to the restrictions of the COVID-19 pandemic. All issues as above were discussed and addressed but no physical exam was performed. If it was felt that the patient should be evaluated in the office, they were directed there. The patient verbally consented to this visit. Patient was unable to complete an audio/visual visit due to Lack of equipment. Due to the catastrophic nature of the COVID-19 pandemic, this visit was done through audio  contact only. . Location of the patient: home . Location of the provider: work . Those involved with this call:  . Provider: Park Liter, DO . CMA: Tiffany Reel, CMA . Front Desk/Registration: Don Perking  . Time spent on call: 15 minutes with patient face to face via video conference. More than 50% of this time was spent in counseling and coordination of care. 23 minutes total spent in review of patient's record and preparation of their chart.

## 2019-07-06 ENCOUNTER — Ambulatory Visit: Payer: 59 | Attending: Internal Medicine

## 2019-07-06 ENCOUNTER — Encounter: Payer: Self-pay | Admitting: Family Medicine

## 2019-07-06 DIAGNOSIS — Z20822 Contact with and (suspected) exposure to covid-19: Secondary | ICD-10-CM

## 2019-07-07 LAB — NOVEL CORONAVIRUS, NAA: SARS-CoV-2, NAA: NOT DETECTED

## 2019-07-12 ENCOUNTER — Telehealth: Payer: Self-pay | Admitting: Family Medicine

## 2019-07-12 MED ORDER — AMOXICILLIN-POT CLAVULANATE 875-125 MG PO TABS: 1.0000 | ORAL_TABLET | Freq: Two times a day (BID) | ORAL | 0 refills | Status: DC

## 2019-07-12 NOTE — Telephone Encounter (Signed)
Patient notified

## 2019-07-12 NOTE — Telephone Encounter (Signed)
Copied from [place] [id number]. Topic: General - Other >> Jul 12, 2019  3:11 PM Rainey Pines A wrote: Patient would like a callback from Dr. Wynetta Emery nurse in regards to still not feeling well. Patient still feels she has sinus infection alond with sore throat and not feeling full after eating. Patient took medication but feels as though symptoms are the same. Please advise

## 2019-07-12 NOTE — Telephone Encounter (Signed)
Antibiotic sent to her pharmacy. If not getting better, appt

## 2019-07-12 NOTE — Telephone Encounter (Signed)
Routing to provider to advise.  

## 2019-08-05 ENCOUNTER — Encounter: Payer: Self-pay | Admitting: Family Medicine

## 2019-08-05 ENCOUNTER — Other Ambulatory Visit: Payer: Self-pay

## 2019-08-05 ENCOUNTER — Ambulatory Visit: Payer: 59 | Admitting: Family Medicine

## 2019-08-05 VITALS — BP 110/69 | HR 84 | Temp 98.0°F | Wt 156.2 lb

## 2019-08-05 DIAGNOSIS — M79671 Pain in right foot: Secondary | ICD-10-CM

## 2019-08-05 MED ORDER — NAPROXEN 500 MG PO TABS: 500.0000 mg | ORAL_TABLET | Freq: Two times a day (BID) | ORAL | 1 refills | Status: DC

## 2019-08-05 NOTE — Patient Instructions (Signed)
Fascitis plantar  Plantar Fasciitis    La fascitis plantar es una afeccin dolorosa que se produce en el taln. Ocurre cuando la banda de tejido que conecta los dedos con el hueso del taln (fascia plantar) se irrita. Esto puede ocurrir por hacer mucho ejercicio u otras actividades repetitivas (lesin por uso excesivo).  El dolor de la fascitis plantar puede abarcar desde una leve irritacin a dolor intenso, y en los casos ms agudos puede dificultar que la persona camine o se mueva. Por lo general, el dolor es peor a la maana despus de dormir, o despus de permanecer sentado o acostado durante un tiempo. El dolor tambin puede empeorar despus de caminar o estar de pie por mucho tiempo.  Cules son las causas?  Esta afeccin puede ser causada por lo siguiente:   Estar de pie durante largos perodos.   Usar zapatos que no tengan un buen soporte para el arco.   Practicar actividades que ponen tensin en las articulaciones (actividades de alto impacto), como correr, hacer ejercicios aerbicos o practicar ballet.   Tener sobrepeso.   Tener una forma de caminar (un andar) anormal.   Presentar rigidez muscular en la parte posterior de la parte inferior de la pierna (pantorrilla).   Tener un arco alto de los pies.   Comenzar una nueva actividad fsica.  Cules son los signos o los sntomas?  El sntoma principal de esta afeccin es el dolor en el taln. El dolor:   Puede empeorar con los primeros pasos luego de estar en reposo, especialmente por la maana despus de dormir o de haber estado sentado o acostado durante mucho tiempo.   Puede empeorar despus de largos perodos de estar parado.   Puede disminuir despus de 30 a 45 minutos de actividad, como caminar apaciblemente.  Cmo se diagnostica?  Esta afeccin puede diagnosticarse en funcin de sus antecedentes mdicos y sus sntomas. Su mdico puede preguntarle sobre su nivel de actividad. El mdico le realizar un examen fsico para controlar si tiene  lo siguiente:   Un rea dolorida en la parte inferior del pie.   El arco alto.   Dolor al mover el pie.   Dificultad para mover el pie.  Pueden realizarle estudios de diagnstico por imagen para confirmar el diagnstico, por ejemplo:   Radiografas.   Ecografa.   Resonancia magntica (RM).  Cmo se trata?  El tratamiento de la fascitis plantar depende de la gravedad de su afeccin. El tratamiento puede incluir lo siguiente:   Reposo, hielo, presin (compresin) y levantar el pie afectado (elevacin). Esto puede conocerse como [place] de RHCE (Reposo, hielo, compresin, elevacin). El mdico puede recomendarle Terapia de RHCE junto con medicamentos de venta libre para aliviar el dolor.   Ejercicios para estirar las pantorrillas y la fascia plantar.   Una frula que mantiene el pie estirado y hacia arriba mientras usted duerme (frula nocturna).   Fisioterapia para aliviar los sntomas y evitar problemas en el futuro.   Inyecciones de corticoesteroides para aliviar el dolor y la inflamacin.   Estimular su fascia plantar lesionada con impulsos elctricos (tratamiento con ondas de choque extracorprea). Esto generalmente es la ltima opcin de tratamiento antes de la ciruga.   Ciruga, si los otros tratamientos no han funcionado despus de 12meses.  Siga estas indicaciones en su casa:    Control del dolor, la rigidez y la hinchazn   Si se lo indican, aplique hielo sobre la zona del dolor:  ? Ponga el hielo en una bolsa   2a 3veces al da.  Use calzado deportivo con amortiguacin de aire o gel, o intente usar plantillas blandas diseadas para la fascitis plantar.  Cuando est sentado o acostado, levante (eleve) el pie por encima del nivel del corazn. Actividad  Evite las  actividades que causan dolor. Pregntele al mdico qu actividades son seguras para usted.  Haga los ejercicios de fisioterapia y estiramiento como se lo haya indicado el mdico.  Intente hacer actividades y tipos de ejercicio que sean ms fciles para las articulaciones (de bajo impacto). Por ejemplo, nadar, hacer ejercicios [employer], y Catering manager. Instrucciones generales  [employer] de venta libre y los recetados solamente como se lo haya indicado el mdico.  Si el mdico se lo indica, use una frula nocturna para dormir. Afloje la frula si los dedos de los pies se le entumecen, siente hormigueos o se le enfran y se tornan de Optician, dispensing.  Mantenga un peso saludable, o colabore con su mdico para perder [employer].  Concurra a todas las visitas de control como se lo haya indicado el mdico. Esto es importante. Comunquese con un mdico si:  Tiene sntomas que no desaparecen despus tratarlos en su casa.  Siente un dolor que [place].  Siente un dolor que afecta su capacidad de moverse o de Optometrist sus actividades diarias. Resumen  La fascitis plantar es una afeccin dolorosa que se produce en el taln. Ocurre cuando la banda de tejido que [employer] dedos con el hueso del taln (fascia plantar) se irrita.  El sntoma principal de esta afeccin es Conservation officer, historic buildings en el taln que puede empeorar despus de hacer mucho ejercicio o de Public affairs consultant parado por mucho tiempo.  El tratamiento vara, pero por lo general comienza con reposo, hielo, compresin, y elevacin (Tratamiento de RHCE) y los medicamentos de venta libre para Financial controller. Esta informacin no tiene Marine scientist el consejo del mdico. Asegrese de hacerle al mdico cualquier pregunta que tenga. Document Revised: 07/04/2017 Document Reviewed: 03/15/2017 Elsevier Patient Education  [place].  Fascitis plantar, rehabilitacin Plantar Fasciitis Rehab Pregunte al mdico qu ejercicios  son seguros para usted. Haga los ejercicios exactamente como se lo haya indicado el mdico y gradelos como se lo hayan indicado. Es normal sentir un estiramiento leve, tironeo, opresin o Tree surgeon al [employer] [place]. Detngase de inmediato si siente un dolor repentino o Printmaker. No comience a hacer estos ejercicios hasta que se lo indique el mdico. Ejercicios de elongacin y amplitud de movimiento Estos ejercicios calientan los msculos y las articulaciones, y mejoran la movilidad y la flexibilidad del pie. Adems, ayudan a Best boy. Estiramiento de la fascia plantar  1. Sintese con la pierna izquierda/derecha cruzada sobre la rodilla opuesta. 2. Sostenga el taln con [place], con el pulgar cerca del arco. Con la otra mano, sostenga los dedos de los pies y empjelos con [place]. Debe sentir un estiramiento en la parte de abajo de los dedos o del pie (fascia plantar), o de ambos. 3. Mantenga esta posicin durante _________ segundos. 4. Afloje lentamente los dedos y vuelva a la posicin inicial. Repita __________ veces. Realice este ejercicio __________ veces al da. Estiramiento de los gemelos, de pie Este ejercicio tambin se denomina estiramiento de la pantorrilla (los msculos gemelos). Estira los msculos posteriores de la parte superior de la pantorrilla. 1. Prese con las manos USAA pared. 2. Extienda la pierna izquierda/derecha hacia atrs y flexione ligeramente la  rodilla de la pierna de adelante. 3. Mantenga los talones apoyados en el suelo y la rodilla de atrs extendida, y lleve el peso hacia la pared. No arquee la espalda. Debe sentir un estiramiento suave en la parte superior de la pantorrilla izquierda/derecha. 4. Mantenga esta posicin durante __________ segundos. Repita __________ veces. Realice este ejercicio __________ veces al da. Estiramiento del msculo sleo, de pie Este ejercicio tambin se denomina estiramiento de la  pantorrilla (sleo). Estira los msculos posteriores de la parte inferior de la pantorrilla. 1. Prese con las manos USAA pared. 2. Extienda la pierna izquierda/derecha hacia atrs y flexione ligeramente la rodilla de la pierna de adelante. 3. Mantenga los talones apoyados en el suelo, flexione la rodilla de atrs y lleve el peso ligeramente a la pierna de atrs. Debe sentir un estiramiento suave en la parte profunda de la parte inferior de la pantorrilla. 4. Mantenga esta posicin durante __________ segundos. Repita __________ veces. Realice este ejercicio __________ veces al da. Estiramiento de los [employer] gemelos y sleo, de pie con un escaln Este ejercicio estira los msculos posteriores de la parte inferior de la pierna. Estos msculos se encuentran en la parte superior de la pantorrilla (gastrocnemio) y la parte inferior de la pantorrilla (sleo). 1. Prese sobre un escaln apoyando solo la regin metatarsiana de su pie derecho/izquierdo. La regin metatarsiana del pie es la superficie sobre la que caminamos, justo debajo de los dedos. 2. Mantenga el otro pie apoyado con firmeza en el mismo escaln. 3. Sostngase de la pared o de una baranda para mantener el equilibrio. 4. [place] lentamente el otro pie y deje que el peso del cuerpo presione el taln izquierdo/derecho sobre el borde del escaln. Debe sentir un estiramiento en la pantorrilla izquierda/derecha. 5. Mantenga esta posicin durante __________ segundos. 6. Vuelva a poner ambos pies sobre el escaln. 7. Repita este ejercicio con una leve flexin en la rodilla izquierda/derecha. Reptalo __________ veces con la rodilla izquierda/derecha extendida y __________ veces con la rodilla izquierda/derecha flexionada. Realice este ejercicio __________ veces al da. Ejercicio de equilibrio Este ejercicio aumenta el equilibrio y el control de la fuerza del arco, para ayudar a reducir la presin sobre la fascia plantar. Pararse  sobre una pierna Si este ejercicio es muy fcil, puede intentar hacerlo con los ojos cerrados o parado sobre [place]. 1. Sin calzado, prese cerca de una baranda o [place]. Puede sostenerse de la baranda o del marco de la puerta, segn lo necesite. 2. Prese sobre el pie izquierdo/derecho. Sin despegar el dedo gordo del suelo, intente mantener el arco levantado. No deje que el pie se vaya hacia adentro. 3. Mantenga esta posicin durante __________ segundos. Repita __________ veces. Realice este ejercicio __________ veces al da. Esta informacin no tiene Marine scientist el consejo del mdico. Asegrese de hacerle al mdico cualquier pregunta que tenga. Document Revised: 02/24/2018 Document Reviewed: 02/24/2018 Elsevier Patient Education  2020 [employer].

## 2019-08-05 NOTE — Progress Notes (Signed)
BP 110/69 (BP Location: Left Arm, Patient Position: Sitting, Cuff Size: Normal)   Pulse 84   Temp 98 F (36.7 C) (Oral)   Wt 156 lb 3.2 oz (70.9 kg)   SpO2 97%   BMI 26.81 kg/m    Subjective:    Patient ID: Karen Mess., female    DOB: 12-23-1970, 49 y.o.   MRN: [id number]  HPI: Karen Walker is a 49 y.o. female  Chief Complaint  Patient presents with  . Foot Pain    right    FOOT PAIN Duration: 2 weeks Involved foot: right Mechanism of injury: unknown Location: on both sides of her mid foot and the plantar area Onset: gradual  Severity: severe  Quality:  Aching, sometimes sharp Frequency: constant Radiation: no Aggravating factors:in the afternoons, touching her bones  Alleviating factors:bengay, rubbing it   Status: worse Treatments attempted: massage, stretches   Relief with NSAIDs?:  No NSAIDs Taken Weakness with weight bearing or walking: yes Morning stiffness: no Swelling: yes Redness: no Bruising: no Paresthesias / decreased sensation: no  Fevers:no  Relevant past medical, surgical, family and social history reviewed and updated as indicated. Interim medical history since our last visit reviewed. Allergies and medications reviewed and updated.  Review of Systems  Constitutional: Negative.   Respiratory: Negative.   Cardiovascular: Negative.   Musculoskeletal: Positive for arthralgias, gait problem and myalgias. Negative for back pain, joint swelling, neck pain and neck stiffness.  Skin: Negative.   Psychiatric/Behavioral: Negative.     Per HPI unless specifically indicated above     Objective:    BP 110/69 (BP Location: Left Arm, Patient Position: Sitting, Cuff Size: Normal)   Pulse 84   Temp 98 F (36.7 C) (Oral)   Wt 156 lb 3.2 oz (70.9 kg)   SpO2 97%   BMI 26.81 kg/m   Wt Readings from Last 3 Encounters:  08/05/19 156 lb 3.2 oz (70.9 kg)  01/14/19 159 lb (72.1 kg)  11/26/18 154 lb (69.9 kg)    Physical Exam Vitals and nursing  note reviewed.  Constitutional:      General: She is not in acute distress.    Appearance: Normal appearance. She is not ill-appearing, toxic-appearing or diaphoretic.  HENT:     Head: Normocephalic and atraumatic.     Right Ear: External ear normal.     Left Ear: External ear normal.     Nose: Nose normal.     Mouth/Throat:     Mouth: Mucous membranes are moist.     Pharynx: Oropharynx is clear.  Eyes:     General: No scleral icterus.       Right eye: No discharge.        Left eye: No discharge.     Extraocular Movements: Extraocular movements intact.     Conjunctiva/sclera: Conjunctivae normal.     Pupils: Pupils are equal, round, and reactive to light.  Cardiovascular:     Rate and Rhythm: Normal rate and regular rhythm.     Pulses: Normal pulses.     Heart sounds: Normal heart sounds. No murmur. No friction rub. No gallop.   Pulmonary:     Effort: Pulmonary effort is normal. No respiratory distress.     Breath sounds: Normal breath sounds. No stridor. No wheezing, rhonchi or rales.  Chest:     Chest wall: No tenderness.  Musculoskeletal:        General: Tenderness (on plantar fascia on R foot) present. Normal range of motion.  Cervical back: Normal range of motion and neck supple.  Skin:    General: Skin is warm and dry.     Capillary Refill: Capillary refill takes less than 2 seconds.     Coloration: Skin is not jaundiced or pale.     Findings: No bruising, erythema, lesion or rash.  Neurological:     General: No focal deficit present.     Mental Status: She is alert and oriented to person, place, and time. Mental status is at baseline.  Psychiatric:        Mood and Affect: Mood normal.        Behavior: Behavior normal.        Thought Content: Thought content normal.        Judgment: Judgment normal.     Results for orders placed or performed in visit on 07/06/19  Novel Coronavirus, NAA ([facility])   Specimen: Nasopharyngeal(NP) swabs in vial transport medium    NASOPHARYNGE  TESTING  Result Value Ref Range   SARS-CoV-2, NAA Not Detected Not Detected      Assessment & Plan:   Problem List Items Addressed This Visit    None    Visit Diagnoses    Right foot pain    -  Primary   Concern for arthritis s/p surgery. Will check xray and start naproxen. Stretches given. Recheck 1 month. If not getting better will refer to podiatry.   Relevant Orders   DG Foot Complete Right       Follow up plan: Return in about 4 weeks (around 09/02/2019).

## 2019-08-29 ENCOUNTER — Ambulatory Visit
Admission: RE | Admit: 2019-08-29 | Discharge: 2019-08-29 | Disposition: A | Discharge status: Home / Self Care | Payer: 59 | Source: Ambulatory Visit | Attending: Family Medicine | Admitting: Family Medicine

## 2019-08-29 ENCOUNTER — Other Ambulatory Visit: Payer: Self-pay

## 2019-08-29 DIAGNOSIS — M79671 Pain in right foot: Secondary | ICD-10-CM

## 2019-08-29 DIAGNOSIS — M7731 Calcaneal spur, right foot: Secondary | ICD-10-CM | POA: Diagnosis not present

## 2019-09-06 ENCOUNTER — Encounter: Payer: Self-pay | Admitting: Family Medicine

## 2019-09-06 ENCOUNTER — Other Ambulatory Visit: Payer: Self-pay

## 2019-09-06 ENCOUNTER — Ambulatory Visit: Payer: 59 | Admitting: Family Medicine

## 2019-09-06 VITALS — BP 102/66 | HR 90 | Temp 97.9°F | Ht 65.06 in | Wt 159.2 lb

## 2019-09-06 DIAGNOSIS — M722 Plantar fascial fibromatosis: Secondary | ICD-10-CM | POA: Insufficient documentation

## 2019-09-06 DIAGNOSIS — R0981 Nasal congestion: Secondary | ICD-10-CM

## 2019-09-06 DIAGNOSIS — M48062 Spinal stenosis, lumbar region with neurogenic claudication: Secondary | ICD-10-CM

## 2019-09-06 HISTORY — DX: Plantar fascial fibromatosis: M72.2

## 2019-09-06 MED ORDER — PREDNISONE 50 MG PO TABS: 50.0000 mg | ORAL_TABLET | Freq: Every day | ORAL | 0 refills | Status: DC

## 2019-09-06 NOTE — Assessment & Plan Note (Signed)
Discussed back pain and getting her back into see ortho. She would like to hold off until after she has her foot looked at. Will let us know if she wants to go back to ortho.

## 2019-09-06 NOTE — Progress Notes (Signed)
BP 102/66 (BP Location: Left Arm, Patient Position: Sitting, Cuff Size: Normal)   Pulse 90   Temp 97.9 F (36.6 C) (Oral)   Ht 5' 5.06" (1.653 m)   Wt 159 lb 3.2 oz (72.2 kg)   SpO2 97%   BMI 26.44 kg/m    Subjective:    Patient ID: Karen Mess., female    DOB: Aug 18, 1970, 49 y.o.   MRN: [id number]  HPI: Karen Walker is a 49 y.o. female  Chief Complaint  Patient presents with  . Foot Pain    right   FOOT PAIN Duration: 6 weeks Involved foot: right Mechanism of injury: unknown Location: on both sides of her mid foot and the plantar area Onset: gradual  Severity: severe  Quality:  Aching, sometimes sharp Frequency: constant Radiation: no Aggravating factors:touching her bones   Alleviating factors:bengay, rubbing it   Status: stable Treatments attempted:naproxen   Relief with NSAIDs?:  mild Weakness with weight bearing or walking: yes Morning stiffness: no Swelling: yes Redness: no Bruising: no Paresthesias / decreased sensation: no  Fevers:no   UPPER RESPIRATORY TRACT INFECTION Duration: 2 week Worst symptom: congestion Fever: no Cough: no Shortness of breath: no Wheezing: no Chest pain: no Chest tightness: no Chest congestion: no Nasal congestion: yes Runny nose: yes Post nasal drip: yes Sneezing: no Sore throat: no Swollen glands: no Sinus pressure: yes Headache: no Face pain: no Toothache: no Ear pain: no  Ear pressure: no  Eyes red/itching:no Eye drainage/crusting: no  Vomiting: no Rash: no Fatigue: yes Sick contacts: no Strep contacts: no  Context: stable Recurrent sinusitis: no Relief with OTC cold/cough medications: no  Treatments attempted: none   Relevant past medical, surgical, family and social history reviewed and updated as indicated. Interim medical history since our last visit reviewed. Allergies and medications reviewed and updated.  Review of Systems  Constitutional: Negative.   Respiratory: Negative.     Cardiovascular: Negative.   Gastrointestinal: Negative.   Musculoskeletal: Positive for back pain, gait problem and myalgias. Negative for arthralgias, joint swelling, neck pain and neck stiffness.  Neurological: Negative for dizziness, tremors, seizures, syncope, facial asymmetry, speech difficulty, weakness, light-headedness, numbness and headaches.  Psychiatric/Behavioral: Negative.     Per HPI unless specifically indicated above     Objective:    BP 102/66 (BP Location: Left Arm, Patient Position: Sitting, Cuff Size: Normal)   Pulse 90   Temp 97.9 F (36.6 C) (Oral)   Ht 5' 5.06" (1.653 m)   Wt 159 lb 3.2 oz (72.2 kg)   SpO2 97%   BMI 26.44 kg/m   Wt Readings from Last 3 Encounters:  09/06/19 159 lb 3.2 oz (72.2 kg)  08/05/19 156 lb 3.2 oz (70.9 kg)  01/14/19 159 lb (72.1 kg)    Physical Exam Vitals and nursing note reviewed.  Constitutional:      General: She is not in acute distress.    Appearance: Normal appearance. She is normal weight. She is not ill-appearing, toxic-appearing or diaphoretic.  HENT:     Head: Normocephalic and atraumatic.     Right Ear: Tympanic membrane, ear canal and external ear normal. There is no impacted cerumen.     Left Ear: Tympanic membrane, ear canal and external ear normal. There is no impacted cerumen.     Nose: Nose normal. No congestion or rhinorrhea.     Mouth/Throat:     Mouth: Mucous membranes are moist.     Pharynx: Oropharynx is clear. No oropharyngeal exudate  or posterior oropharyngeal erythema.  Eyes:     General: No scleral icterus.       Right eye: No discharge.        Left eye: No discharge.     Extraocular Movements: Extraocular movements intact.     Conjunctiva/sclera: Conjunctivae normal.     Pupils: Pupils are equal, round, and reactive to light.  Neck:     Vascular: No carotid bruit.  Cardiovascular:     Rate and Rhythm: Normal rate and regular rhythm.     Pulses: Normal pulses.     Heart sounds: Normal heart  sounds. No murmur. No friction rub. No gallop.   Pulmonary:     Effort: Pulmonary effort is normal. No respiratory distress.     Breath sounds: Normal breath sounds. No stridor. No wheezing, rhonchi or rales.  Chest:     Chest wall: No tenderness.  Musculoskeletal:        General: Normal range of motion.     Cervical back: Normal range of motion and neck supple. No rigidity or tenderness.  Lymphadenopathy:     Cervical: No cervical adenopathy.  Skin:    General: Skin is warm and dry.     Capillary Refill: Capillary refill takes less than 2 seconds.     Coloration: Skin is not jaundiced or pale.     Findings: No bruising, erythema, lesion or rash.  Neurological:     General: No focal deficit present.     Mental Status: She is alert and oriented to person, place, and time. Mental status is at baseline.  Psychiatric:        Mood and Affect: Mood normal.        Behavior: Behavior normal.        Thought Content: Thought content normal.        Judgment: Judgment normal.     Results for orders placed or performed in visit on 07/06/19  Novel Coronavirus, NAA ([facility])   Specimen: Nasopharyngeal(NP) swabs in vial transport medium   NASOPHARYNGE  TESTING  Result Value Ref Range   SARS-CoV-2, NAA Not Detected Not Detected      Assessment & Plan:   Problem List Items Addressed This Visit      Musculoskeletal and Integument   Plantar fasciitis - Primary    Will get her back into podiatry as not getting better. Referral generated today.      Relevant Orders   Ambulatory referral to Podiatry     Other   Spinal stenosis of lumbar region with neurogenic claudication    Discussed back pain and getting her back into see ortho. She would like to hold off until after she has her foot looked at. Will let us know if she wants to go back to ortho.      Relevant Medications   predniSONE (DELTASONE) 50 MG tablet    Other Visit Diagnoses    Nasal congestion       No sign of sinusitis.  Will treat with burst of prednisone. Call if not getting better or getting worse.        Follow up plan: Return august physical.

## 2019-09-06 NOTE — Assessment & Plan Note (Signed)
Will get her back into podiatry as not getting better. Referral generated today.

## 2019-09-09 ENCOUNTER — Ambulatory Visit: Payer: 59 | Admitting: Podiatry

## 2019-09-09 ENCOUNTER — Encounter: Payer: Self-pay | Admitting: Podiatry

## 2019-09-09 ENCOUNTER — Other Ambulatory Visit: Payer: Self-pay

## 2019-09-09 DIAGNOSIS — Q742 Other congenital malformations of lower limb(s), including pelvic girdle: Secondary | ICD-10-CM | POA: Diagnosis not present

## 2019-09-09 DIAGNOSIS — M76821 Posterior tibial tendinitis, right leg: Secondary | ICD-10-CM

## 2019-09-09 NOTE — Patient Instructions (Signed)
Pre-Operative Instructions  Congratulations, you have decided to take an important step towards improving your quality of life.  You can be assured that the doctors and staff at [facility] will be with you every step of the way.  Here are some important things you should know:  1. Plan to be at the surgery center/hospital at least 1 (one) hour prior to your scheduled time, unless otherwise directed by the surgical center/hospital staff.  You must have a responsible adult accompany you, remain during the surgery and drive you home.  Make sure you have directions to the surgical center/hospital to ensure you arrive on time. 2. If you are having surgery at [facility] or [facility], you will need a copy of your medical history and physical form from your family physician within one month prior to the date of surgery. We will give you a form for your primary physician to complete.  3. We make every effort to accommodate the date you request for surgery.  However, there are times where surgery dates or times have to be moved.  We will contact you as soon as possible if a change in schedule is required.   4. No aspirin/ibuprofen for one week before surgery.  If you are on aspirin, any non-steroidal anti-inflammatory medications (Mobic, Aleve, Ibuprofen) should not be taken seven (7) days prior to your surgery.  You make take Tylenol for pain prior to surgery.  5. Medications - If you are taking daily heart and blood pressure medications, seizure, reflux, allergy, asthma, anxiety, pain or diabetes medications, make sure you notify the surgery center/hospital before the day of surgery so they can tell you which medications you should take or avoid the day of surgery. 6. No food or drink after midnight the night before surgery unless directed otherwise by surgical center/hospital staff. 7. No alcoholic beverages 24-hours prior to surgery.  No smoking 24-hours prior or 24-hours after  surgery. 8. Wear loose pants or shorts. They should be loose enough to fit over bandages, boots, and casts. 9. Don't wear slip-on shoes. Sneakers are preferred. 10. Bring your boot with you to the surgery center/hospital.  Also bring crutches or a walker if your physician has prescribed it for you.  If you do not have this equipment, it will be provided for you after surgery. 11. If you have not been contacted by the surgery center/hospital by the day before your surgery, call to confirm the date and time of your surgery. 12. Leave-time from work may vary depending on the type of surgery you have.  Appropriate arrangements should be made prior to surgery with your employer. 13. Prescriptions will be provided immediately following surgery by your doctor.  Fill these as soon as possible after surgery and take the medication as directed. Pain medications will not be refilled on weekends and must be approved by the doctor. 14. Remove nail polish on the operative foot and avoid getting pedicures prior to surgery. 15. Wash the night before surgery.  The night before surgery wash the foot and leg well with water and the antibacterial soap provided. Be sure to pay special attention to beneath the toenails and in between the toes.  Wash for at least three (3) minutes. Rinse thoroughly with water and dry well with a towel.  Perform this wash unless told not to do so by your physician.  Enclosed: 1 Ice pack (please put in freezer the night before surgery)   1 Hibiclens skin cleaner     Pre-op instructions  If you have any questions regarding the instructions, please do not hesitate to call our office.  [place]: [address], [place], [place] [address] -- [phone]  [place]: [address] [place], [place] [address] -- [phone]  [place]: [address], [place], [place] [address] -- [phone]   Website: [email or web address] 

## 2019-09-11 NOTE — Progress Notes (Signed)
   HPI: 49 y.o. female presenting today for new complaint regarding pain to the medial aspect of the patient's right foot that is been going on for several years.  Patient was last seen in the office 2018 for plantar fasciitis which is resolved.  Patient works at [employer] on her feet all day long.  She has significant pain to the medial aspect of the right foot navicular.  There has been no improvement despite different shoes.  She presents for further treatment evaluation  Past Medical History:  Diagnosis Date  . Allergic rhinitis   . Anxiety   . Breast lump on right side at 8 o'clock position 12/06/2014  . Depression   . History of skin cancer   . Hx of migraine headaches    light sensivity, unilateral HA  . Urticaria      Physical Exam: General: The patient is alert and oriented x3 in no acute distress.  Dermatology: Skin is warm, dry and supple bilateral lower extremities. Negative for open lesions or macerations.  Vascular: Palpable pedal pulses bilaterally. No edema or erythema noted. Capillary refill within normal limits.  Neurological: Epicritic and protective threshold grossly intact bilaterally.   Musculoskeletal Exam: Range of motion within normal limits to all pedal and ankle joints bilateral. Muscle strength 5/5 in all groups bilateral.  Pain to palpation noted at the navicular tuberosity.  There is a very large prominent navicular also noted clinically  Radiographic Exam:  Normal osseous mineralization. Joint spaces preserved. No fracture/dislocation/boney destruction.  Hypertrophic navicular noted to the right foot.  This may be an accessory navicular versus a gorilloid navicular  Assessment: 1.  Hypertrophic navicular medial aspect of right foot   Plan of Care:  1. Patient evaluated. X-Rays reviewed.  2.  I explained to the patient and we reviewed the x-rays together.  I do not suspect that any conservative treatment would help alleviate the patient's symptoms due to the  large prominence of the navicular tuberosity.  I explained that the best treatment and most definitive treatment would be a navicular exostectomy with repair of the posterior tibial tendon.  All possible complications and details of procedure were explained.  No guarantees were expressed or implied.  I explained the patient will also need to be nonweightbearing to the foot for minimum 4 weeks.  Patient also understands she will be out of work a minimum 8 weeks 3.  Patient understands and agrees with surgical intervention at this time.  All patient questions answered. 4.  Authorization for surgery was initiated today.  Surgery will consist of navicular exostectomy with repair posterior tibial tendon right 5.  Return to clinic 1 week postop  *Works environmental at [employer], DPM [facility]  Dr. Edrick Kins, DPM    [address], [place] [address]                Office [phone]  Fax [phone]

## 2019-09-13 ENCOUNTER — Telehealth: Payer: Self-pay | Admitting: *Deleted

## 2019-09-13 DIAGNOSIS — M79676 Pain in unspecified toe(s): Secondary | ICD-10-CM

## 2019-09-13 NOTE — Telephone Encounter (Signed)
I received FMLA paperwork for you.  Have you scheduled a surgery date?  "They said June 24."  That information is all I needed.

## 2019-09-14 ENCOUNTER — Telehealth: Payer: Self-pay | Admitting: *Deleted

## 2019-09-15 ENCOUNTER — Telehealth: Payer: Self-pay

## 2019-09-15 ENCOUNTER — Encounter: Payer: Self-pay | Admitting: Family Medicine

## 2019-09-15 NOTE — Telephone Encounter (Signed)
DOS 09/29/2019  REPAIR POSTERIOR TIBIAL TENDON RT - 16945 NAVICULAR EXOSTECTOMY RT - 03888  [facility] EFFECTIVE DATE - 04/08/2019  PLAN DEDUCTIBLE - $300.00 W/ $0.00 REMAINING OUT OF POCKET - $7900.00 W/ $2800.34 REMAINING COPAY $15.00 COINSURANCE - 40%  SPOKE TO [facility] AT [place] REF # [id number]. hE STATED NO PRECERT REQUIRED FOR CPT [phone] & [phone]

## 2019-09-15 NOTE — Telephone Encounter (Signed)
I called Karen Walker to see when she's scheduled for surgery.  She informed me that she's scheduled for 09/29/2019.  I faxed her completed FMLA paperwork to [employer].

## 2019-09-29 ENCOUNTER — Other Ambulatory Visit: Payer: Self-pay | Admitting: Podiatry

## 2019-09-29 DIAGNOSIS — M76821 Posterior tibial tendinitis, right leg: Secondary | ICD-10-CM | POA: Diagnosis not present

## 2019-09-29 DIAGNOSIS — Z01818 Encounter for other preprocedural examination: Secondary | ICD-10-CM | POA: Diagnosis not present

## 2019-09-29 DIAGNOSIS — M898X7 Other specified disorders of bone, ankle and foot: Secondary | ICD-10-CM | POA: Diagnosis not present

## 2019-09-29 DIAGNOSIS — Q6689 Other  specified congenital deformities of feet: Secondary | ICD-10-CM | POA: Diagnosis not present

## 2019-09-29 DIAGNOSIS — Q742 Other congenital malformations of lower limb(s), including pelvic girdle: Secondary | ICD-10-CM | POA: Diagnosis not present

## 2019-09-29 DIAGNOSIS — M25571 Pain in right ankle and joints of right foot: Secondary | ICD-10-CM | POA: Diagnosis not present

## 2019-09-29 MED ORDER — NAPROXEN 500 MG PO TABS: 500.0000 mg | ORAL_TABLET | Freq: Two times a day (BID) | ORAL | 1 refills | Status: DC

## 2019-09-29 MED ORDER — HYDROMORPHONE HCL 4 MG PO TABS: 4.0000 mg | ORAL_TABLET | ORAL | 0 refills | Status: DC | PRN

## 2019-09-29 NOTE — Progress Notes (Signed)
PRN postop 

## 2019-10-06 ENCOUNTER — Telehealth: Payer: Self-pay | Admitting: *Deleted

## 2019-10-06 NOTE — Telephone Encounter (Signed)
"  I want to confirm that this patient had surgery on 09/29/2019.  If so, we would like the ICD-10 and the CPT code ad also the return to work date for her.  This is to allow her short term disability benefits.  Please give [place] a call.  I'd like a follow up about the surgery."

## 2019-10-07 ENCOUNTER — Ambulatory Visit (INDEPENDENT_AMBULATORY_CARE_PROVIDER_SITE_OTHER): Payer: 59 | Admitting: Podiatry

## 2019-10-07 ENCOUNTER — Encounter: Payer: Self-pay | Admitting: Podiatry

## 2019-10-07 ENCOUNTER — Ambulatory Visit (INDEPENDENT_AMBULATORY_CARE_PROVIDER_SITE_OTHER): Payer: 59

## 2019-10-07 ENCOUNTER — Other Ambulatory Visit: Payer: Self-pay

## 2019-10-07 DIAGNOSIS — Q742 Other congenital malformations of lower limb(s), including pelvic girdle: Secondary | ICD-10-CM

## 2019-10-07 DIAGNOSIS — M79676 Pain in unspecified toe(s): Secondary | ICD-10-CM

## 2019-10-07 DIAGNOSIS — Z9889 Other specified postprocedural states: Secondary | ICD-10-CM

## 2019-10-07 MED ORDER — HYDROMORPHONE HCL 4 MG PO TABS: 4.0000 mg | ORAL_TABLET | ORAL | 0 refills | Status: DC | PRN

## 2019-10-07 NOTE — Progress Notes (Signed)
   Subjective:  Patient presents today status post navicular exostectomy with repair posterior tibial tendon right foot. DOS: 09/29/2019.  Patient states that she is doing well however she is having pain throughout the day.  She has been nonweightbearing as directed.  She has been taking the Dilaudid 4 mg as directed for pain.  No new complaints at this time  Past Medical History:  Diagnosis Date  . Allergic rhinitis   . Anxiety   . Breast lump on right side at 8 o'clock position 12/06/2014  . Depression   . History of skin cancer   . Hx of migraine headaches    light sensivity, unilateral HA  . Urticaria       Objective/Physical Exam Neurovascular status intact.  Skin incisions appear to be well coapted with staples intact. No sign of infectious process noted. No dehiscence. No active bleeding noted. Moderate edema noted to the surgical extremity.  Radiographic Exam:  Osteotomies sites appear to be stable with routine healing.  Assessment: 1. s/p navicular exostectomy with repair of posterior tibial tendon right. DOS: 09/29/2019   Plan of Care:  1. Patient was evaluated. X-rays reviewed 2.  Continue strict nonweightbearing to the surgical extremity. 3.  Dressings were changed today. 4.  Refill prescription for Dilaudid 4 mg #30 5.  Return to clinic in 1 week for staple removal  *Works environmental at [employer], DPM [facility]  Dr. Edrick Kins, [address]                                        [place], [place] [address]                Office [phone]  Fax [phone]

## 2019-10-14 ENCOUNTER — Encounter: Payer: Self-pay | Admitting: Podiatry

## 2019-10-14 ENCOUNTER — Other Ambulatory Visit: Payer: Self-pay

## 2019-10-14 ENCOUNTER — Ambulatory Visit (INDEPENDENT_AMBULATORY_CARE_PROVIDER_SITE_OTHER): Payer: 59 | Admitting: Podiatry

## 2019-10-14 DIAGNOSIS — M76821 Posterior tibial tendinitis, right leg: Secondary | ICD-10-CM

## 2019-10-14 DIAGNOSIS — Q742 Other congenital malformations of lower limb(s), including pelvic girdle: Secondary | ICD-10-CM

## 2019-10-14 DIAGNOSIS — Z9889 Other specified postprocedural states: Secondary | ICD-10-CM

## 2019-10-14 NOTE — Progress Notes (Signed)
   Subjective:  Patient presents today status post navicular exostectomy with repair posterior tibial tendon right foot. DOS: 09/29/2019.  Patient states that she is doing well and the pain is tolerable.  She has noticed some increased swelling to the toes of the surgical extremity over the last 24 hours.  Otherwise no new complaints at this time.  Past Medical History:  Diagnosis Date  . Allergic rhinitis   . Anxiety   . Breast lump on right side at 8 o'clock position 12/06/2014  . Depression   . History of skin cancer   . Hx of migraine headaches    light sensivity, unilateral HA  . Urticaria       Objective/Physical Exam Neurovascular status intact.  Skin incisions appear to be well coapted with staples intact. No sign of infectious process noted. No dehiscence. No active bleeding noted. Moderate edema noted to the surgical extremity.  Assessment: 1. s/p navicular exostectomy with repair of posterior tibial tendon right. DOS: 09/29/2019   Plan of Care:  1. Patient was evaluated.  2.  Staples removed today. 3.  Light dressing applied.  Recommend Ace wrap daily.  Ace wraps provided 4.  Continue strict nonweightbearing in the cam boot 5.  Return to clinic in 2 weeks for follow-up x-ray and to initiate possible weightbearing in the cam boot  *Works environmental at [employer]  Edrick Kins, DPM [facility]  Dr. Edrick Kins, [address]                                        [place], [place] [address]                Office [phone]  Fax [phone]

## 2019-10-25 ENCOUNTER — Other Ambulatory Visit: Payer: Self-pay

## 2019-10-25 ENCOUNTER — Ambulatory Visit (INDEPENDENT_AMBULATORY_CARE_PROVIDER_SITE_OTHER): Payer: 59 | Admitting: Podiatry

## 2019-10-25 ENCOUNTER — Encounter: Payer: Self-pay | Admitting: Podiatry

## 2019-10-25 ENCOUNTER — Ambulatory Visit (INDEPENDENT_AMBULATORY_CARE_PROVIDER_SITE_OTHER): Payer: 59

## 2019-10-25 DIAGNOSIS — Z9889 Other specified postprocedural states: Secondary | ICD-10-CM

## 2019-10-25 DIAGNOSIS — Q742 Other congenital malformations of lower limb(s), including pelvic girdle: Secondary | ICD-10-CM

## 2019-10-25 DIAGNOSIS — M76821 Posterior tibial tendinitis, right leg: Secondary | ICD-10-CM

## 2019-10-25 NOTE — Progress Notes (Signed)
° °  Subjective:  Patient presents today status post navicular exostectomy with repair posterior tibial tendon right foot. DOS: 09/29/2019.  Patient states that she is doing very well.  She has been nonweightbearing using the cam boot and crutches.  She does have some minimal swelling throughout the area.  Otherwise no new complaints.  Patient has noticed steady improvement  Past Medical History:  Diagnosis Date   Allergic rhinitis    Anxiety    Breast lump on right side at 8 o'clock position 12/06/2014   Depression    History of skin cancer    Hx of migraine headaches    light sensivity, unilateral HA   Urticaria       Objective/Physical Exam Neurovascular status intact.  Skin incisions appear to be well coapted and healed. No sign of infectious process noted. No dehiscence. No active bleeding noted.  There is no erythema however there is some moderate edema noted to the surgical extremity.  Assessment: 1. s/p navicular exostectomy with repair of posterior tibial tendon right. DOS: 09/29/2019   Plan of Care:  1. Patient was evaluated.  2.  Patient may begin to transition into weightbearing using the cam boot. 3.  Compression anklet dispensed 4.  Patient is going to return to work in 2 weeks.  Sedentary work only in the sitting environment 5.  Return to clinic in 4 weeks.  At that point we may consider transitioning the patient out of the cam boot into good supportive sneakers  *Works environmental at [employer]  Edrick Kins, DPM [facility]  Dr. Edrick Kins, [address]                                        [place], [place] [address]                Office [phone]  Fax [phone]

## 2019-11-11 NOTE — Telephone Encounter (Signed)
Documents were completed and faxed on 10/07/2019.

## 2019-11-14 ENCOUNTER — Encounter: Payer: 59 | Admitting: Family Medicine

## 2019-11-15 ENCOUNTER — Encounter: Payer: 59 | Admitting: Family Medicine

## 2019-11-22 ENCOUNTER — Encounter: Payer: Self-pay | Admitting: *Deleted

## 2019-11-22 ENCOUNTER — Ambulatory Visit (INDEPENDENT_AMBULATORY_CARE_PROVIDER_SITE_OTHER): Payer: 59 | Admitting: Podiatry

## 2019-11-22 ENCOUNTER — Other Ambulatory Visit: Payer: Self-pay

## 2019-11-22 DIAGNOSIS — Z9889 Other specified postprocedural states: Secondary | ICD-10-CM

## 2019-11-22 DIAGNOSIS — Q742 Other congenital malformations of lower limb(s), including pelvic girdle: Secondary | ICD-10-CM

## 2019-11-22 DIAGNOSIS — M76821 Posterior tibial tendinitis, right leg: Secondary | ICD-10-CM

## 2019-11-22 NOTE — Progress Notes (Signed)
° °  Subjective:  Patient presents today status post navicular exostectomy with repair posterior tibial tendon right foot. DOS: 09/29/2019.  Overall the patient is doing much better.  She is not been wearing the cam boot and is wearing sandals today upon presentation.  She does have some swelling throughout the day and with walking longer distances.  She presents for further treatment evaluation.  No new complaints at this time  Past Medical History:  Diagnosis Date   Allergic rhinitis    Anxiety    Breast lump on right side at 8 o'clock position 12/06/2014   Depression    History of skin cancer    Hx of migraine headaches    light sensivity, unilateral HA   Urticaria       Objective/Physical Exam Neurovascular status intact.  Skin incisions appear to be well coapted and healed. No sign of infectious process noted. No dehiscence. No active bleeding noted.  There is no erythema however there is some moderate edema noted to the surgical extremity.  Assessment: 1. s/p navicular exostectomy with repair of posterior tibial tendon right. DOS: 09/29/2019   Plan of Care:  1. Patient was evaluated.  2.  Recommend good supportive sneakers and insoles 3.  Order placed today for physical therapy at [facility] 4.  Today we will extend the patient's return to work 1 additional month to 12/30/2019 5.  Return to clinic in 4 weeks  *Works environmental at [employer], DPM [facility]  Dr. Edrick Kins, [address]                                        [place], [place] [address]                Office [phone]  Fax [phone]

## 2019-11-24 ENCOUNTER — Telehealth: Payer: Self-pay | Admitting: Family Medicine

## 2019-11-24 MED ORDER — VALACYCLOVIR HCL 500 MG PO TABS: 500.0000 mg | ORAL_TABLET | Freq: Two times a day (BID) | ORAL | 3 refills | Status: DC

## 2019-11-24 NOTE — Telephone Encounter (Incomplete)
Medication Refill - Medication:  Has the patient contacted their pharmacy? {yes [id number]} (Agent: If no, request that the patient contact the pharmacy for the refill.) (Agent: If yes, when and what did the pharmacy advise?)  Preferred Pharmacy (with phone number or street name): ***  Agent: Please be advised that RX refills may take up to 3 business days. We ask that you follow-up with your pharmacy.

## 2019-11-24 NOTE — Telephone Encounter (Signed)
Copied from [place] [id number]. Topic: General - Call Back - No Documentation >> Nov 24, 2019  9:50 AM Jaynie Collins D wrote: Reason for CRM: Patient/ is requesting refill for pills that were taken for blisters, patient doesn't remember the name of the medication but wanted PCP to prescribe. Patient does have an appointment on Monday 11/28/2019 but states the blisters are very uncomfortable. Please call patient back with resolution.

## 2019-11-24 NOTE — Telephone Encounter (Signed)
Patient has previously been on Acyclovir and Valtrex. Can either of these be sent?

## 2019-11-28 ENCOUNTER — Encounter: Payer: 59 | Admitting: Family Medicine

## 2019-11-30 DIAGNOSIS — M76821 Posterior tibial tendinitis, right leg: Secondary | ICD-10-CM | POA: Diagnosis not present

## 2019-12-02 DIAGNOSIS — M76821 Posterior tibial tendinitis, right leg: Secondary | ICD-10-CM | POA: Diagnosis not present

## 2019-12-07 DIAGNOSIS — M76821 Posterior tibial tendinitis, right leg: Secondary | ICD-10-CM | POA: Diagnosis not present

## 2019-12-09 DIAGNOSIS — M76821 Posterior tibial tendinitis, right leg: Secondary | ICD-10-CM | POA: Diagnosis not present

## 2019-12-13 DIAGNOSIS — M76821 Posterior tibial tendinitis, right leg: Secondary | ICD-10-CM | POA: Diagnosis not present

## 2019-12-15 DIAGNOSIS — M76821 Posterior tibial tendinitis, right leg: Secondary | ICD-10-CM | POA: Diagnosis not present

## 2019-12-20 DIAGNOSIS — M76821 Posterior tibial tendinitis, right leg: Secondary | ICD-10-CM | POA: Diagnosis not present

## 2019-12-22 DIAGNOSIS — M76821 Posterior tibial tendinitis, right leg: Secondary | ICD-10-CM | POA: Diagnosis not present

## 2019-12-26 ENCOUNTER — Other Ambulatory Visit: Payer: Self-pay

## 2019-12-26 ENCOUNTER — Encounter: Payer: Self-pay | Admitting: Family Medicine

## 2019-12-26 ENCOUNTER — Ambulatory Visit (INDEPENDENT_AMBULATORY_CARE_PROVIDER_SITE_OTHER): Payer: 59 | Admitting: Family Medicine

## 2019-12-26 VITALS — BP 107/71 | HR 83 | Temp 97.3°F | Ht 64.0 in | Wt 165.0 lb

## 2019-12-26 DIAGNOSIS — Z1211 Encounter for screening for malignant neoplasm of colon: Secondary | ICD-10-CM

## 2019-12-26 DIAGNOSIS — Z23 Encounter for immunization: Secondary | ICD-10-CM | POA: Diagnosis not present

## 2019-12-26 DIAGNOSIS — Z0000 Encounter for general adult medical examination without abnormal findings: Secondary | ICD-10-CM

## 2019-12-26 DIAGNOSIS — Z1159 Encounter for screening for other viral diseases: Secondary | ICD-10-CM

## 2019-12-26 DIAGNOSIS — D485 Neoplasm of uncertain behavior of skin: Secondary | ICD-10-CM | POA: Diagnosis not present

## 2019-12-26 LAB — URINALYSIS, ROUTINE W REFLEX MICROSCOPIC
Bilirubin, UA: NEGATIVE
Glucose, UA: NEGATIVE
Ketones, UA: NEGATIVE
Leukocytes,UA: NEGATIVE
Nitrite, UA: NEGATIVE
Protein,UA: NEGATIVE
Specific Gravity, UA: 1.025 (ref 1.005–1.030)
Urobilinogen, Ur: 0.2 mg/dL (ref 0.2–1.0)
pH, UA: 5.5 (ref 5.0–7.5)

## 2019-12-26 LAB — MICROSCOPIC EXAMINATION
Bacteria, UA: NONE SEEN
WBC, UA: NONE SEEN /hpf (ref 0–5)

## 2019-12-26 NOTE — Progress Notes (Signed)
BP 107/71   Pulse 83   Temp (!) 97.3 F (36.3 C) (Oral)   Ht 5\' 4"  (1.626 m)   Wt 165 lb (74.8 kg)   SpO2 99%   BMI 28.32 kg/m    Subjective:    Patient ID: Karen Mess., female    DOB: 1971/03/19, 49 y.o.   MRN: [id number]  HPI: Karen Walker is a 49 y.o. female presenting on 12/26/2019 for comprehensive medical examination. Current medical complaints include:  SKIN LESION Duration: about 3 months Location: R foot Painful: no Itching: no Onset: gradual Context: getting darker Associated signs and symptoms: none History of skin cancer: yes History of precancerous skin lesions: yes Family history of skin cancer: yes  She currently lives with: husband and kids Menopausal Symptoms: yes  Depression Screen done today and results listed below:  Depression screen [facility] 2/9 08/05/2019 01/14/2019 12/16/2018 12/16/2018 11/12/2018  Decreased Interest 3 2 1 1 2   Down, Depressed, Hopeless 3 2 1 1 2   PHQ - 2 Score 6 4 2 2 4   Altered sleeping 3 2 1 1 3   Tired, decreased energy 3 2 1 1 3   Change in appetite 3 3 1 1 3   Feeling bad or failure about yourself  2 2 1 1 3   Trouble concentrating 3 2 1 1 3   Moving slowly or fidgety/restless 1 2 2 2 1   Suicidal thoughts 0 2 1 0 0  PHQ-9 Score 21 19 10 9 20   Difficult doing work/chores Very difficult Somewhat difficult Not difficult at all Very difficult Very difficult  Some recent data might be hidden    Past Medical History:  Past Medical History:  Diagnosis Date  . Allergic rhinitis   . Anxiety   . Breast lump on right side at 8 o'clock position 12/06/2014  . Depression   . History of skin cancer   . Hx of migraine headaches    light sensivity, unilateral HA  . Urticaria     Surgical History:  Past Surgical History:  Procedure Laterality Date  . ABDOMINAL HYSTERECTOMY  2014  . BREAST CYST ASPIRATION Right 2013   negative. Done at Dr. Dwyane Luo office  . HERNIA REPAIR  2014  . hysterectemy    . melonoma removal on R medial  arch of foot  2008  . TOTAL ABDOMINAL HYSTERECTOMY W/ BILATERAL SALPINGOOPHORECTOMY    . UMBILICAL HERNIA REPAIR     occurred with hysterectemy    Medications:  Current Outpatient Medications on File Prior to Visit  Medication Sig  . albuterol (VENTOLIN HFA) 108 (90 Base) MCG/ACT inhaler Inhale 2 puffs into the lungs every 6 (six) hours as needed for wheezing or shortness of breath.  . conjugated estrogens (PREMARIN) vaginal cream 1 applicator full daily for 2 weeks, then decrease to 1-3 times a week  . cyclobenzaprine (FLEXERIL) 10 MG tablet Take 1 tablet (10 mg total) by mouth at bedtime.  Marland Kitchen HYDROmorphone (DILAUDID) 4 MG tablet Take 1 tablet (4 mg total) by mouth every 4 (four) hours as needed for severe pain.  . naproxen (NAPROSYN) 500 MG tablet Take 1 tablet (500 mg total) by mouth 2 (two) times daily with a meal.  . omeprazole (PRILOSEC) 40 MG capsule Take 40 mg by mouth daily.  Marland Kitchen triamcinolone ointment (KENALOG) 0.5 % Apply 1 application topically 2 (two) times daily.  . valACYclovir (VALTREX) 500 MG tablet Take 1 tablet (500 mg total) by mouth 2 (two) times daily.   No current facility-administered  medications on file prior to visit.    Allergies:  Allergies  Allergen Reactions  . Codeine Other (See Comments)  . Pollen Extract     Social History:  Social History   Socioeconomic History  . Marital status: Married    Spouse name: Not on file  . Number of children: Not on file  . Years of education: Not on file  . Highest education level: Not on file  Occupational History  . Not on file  Tobacco Use  . Smoking status: Never Smoker  . Smokeless tobacco: Never Used  Vaping Use  . Vaping Use: Never used  Substance and Sexual Activity  . Alcohol use: No  . Drug use: No  . Sexual activity: Yes    Birth control/protection: Surgical  Other Topics Concern  . Not on file  Social History Narrative  . Not on file   Social Determinants of Health   Financial Resource  Strain:   . Difficulty of Paying Living Expenses: Not on file  Food Insecurity:   . Worried About Charity fundraiser in the Last Year: Not on file  . Ran Out of Food in the Last Year: Not on file  Transportation Needs:   . Lack of Transportation (Medical): Not on file  . Lack of Transportation (Non-Medical): Not on file  Physical Activity:   . Days of Exercise per Week: Not on file  . Minutes of Exercise per Session: Not on file  Stress:   . Feeling of Stress : Not on file  Social Connections:   . Frequency of Communication with Friends and Family: Not on file  . Frequency of Social Gatherings with Friends and Family: Not on file  . Attends Religious Services: Not on file  . Active Member of Clubs or Organizations: Not on file  . Attends Archivist Meetings: Not on file  . Marital Status: Not on file  Intimate Partner Violence:   . Fear of Current or Ex-Partner: Not on file  . Emotionally Abused: Not on file  . Physically Abused: Not on file  . Sexually Abused: Not on file   Social History   Tobacco Use  Smoking Status Never Smoker  Smokeless Tobacco Never Used   Social History   Substance and Sexual Activity  Alcohol Use No    Family History:  Family History  Problem Relation Age of Onset  . Stroke Father   . Asthma Daughter   . Urticaria Son   . Food Allergy Son        red dye, dairy  . Allergic rhinitis Neg Hx   . Angioedema Neg Hx   . Eczema Neg Hx     Past medical history, surgical history, medications, allergies, family history and social history reviewed with patient today and changes made to appropriate areas of the chart.   Review of Systems  Constitutional: Negative.   HENT: Negative.   Eyes: Positive for blurred vision. Negative for double vision, photophobia, pain, discharge and redness.  Respiratory: Negative.   Cardiovascular: Negative.   Gastrointestinal: Negative.   Genitourinary: Negative.   Musculoskeletal: Negative.   Skin:  Negative.        Spot on her R foot  Neurological: Negative.   Endo/Heme/Allergies: Negative.   Psychiatric/Behavioral: Negative.     All other ROS negative except what is listed above and in the HPI.      Objective:    BP 107/71   Pulse 83   Temp (!) 97.3  F (36.3 C) (Oral)   Ht 5\' 4"  (1.626 m)   Wt 165 lb (74.8 kg)   SpO2 99%   BMI 28.32 kg/m   Wt Readings from Last 3 Encounters:  12/26/19 165 lb (74.8 kg)  09/06/19 159 lb 3.2 oz (72.2 kg)  08/05/19 156 lb 3.2 oz (70.9 kg)    Physical Exam Vitals and nursing note reviewed.  Constitutional:      General: She is not in acute distress.    Appearance: Normal appearance. She is not ill-appearing, toxic-appearing or diaphoretic.  HENT:     Head: Normocephalic and atraumatic.     Right Ear: Tympanic membrane, ear canal and external ear normal. There is no impacted cerumen.     Left Ear: Tympanic membrane, ear canal and external ear normal. There is no impacted cerumen.     Nose: Nose normal. No congestion or rhinorrhea.     Mouth/Throat:     Mouth: Mucous membranes are moist.     Pharynx: Oropharynx is clear. No oropharyngeal exudate or posterior oropharyngeal erythema.  Eyes:     General: No scleral icterus.       Right eye: No discharge.        Left eye: No discharge.     Extraocular Movements: Extraocular movements intact.     Conjunctiva/sclera: Conjunctivae normal.     Pupils: Pupils are equal, round, and reactive to light.  Neck:     Vascular: No carotid bruit.  Cardiovascular:     Rate and Rhythm: Normal rate and regular rhythm.     Pulses: Normal pulses.     Heart sounds: No murmur heard.  No friction rub. No gallop.   Pulmonary:     Effort: Pulmonary effort is normal. No respiratory distress.     Breath sounds: Normal breath sounds. No stridor. No wheezing, rhonchi or rales.  Chest:     Chest wall: No tenderness.  Abdominal:     General: Abdomen is flat. Bowel sounds are normal. There is no distension.       Palpations: Abdomen is soft. There is no mass.     Tenderness: There is no abdominal tenderness. There is no right CVA tenderness, left CVA tenderness, guarding or rebound.     Hernia: No hernia is present.  Genitourinary:    Comments: Breast and pelvic exams deferred with shared decision making Musculoskeletal:        General: No swelling, tenderness, deformity or signs of injury.     Cervical back: Normal range of motion and neck supple. No rigidity. No muscular tenderness.     Right lower leg: No edema.     Left lower leg: No edema.  Lymphadenopathy:     Cervical: No cervical adenopathy.  Skin:    General: Skin is warm and dry.     Capillary Refill: Capillary refill takes less than 2 seconds.     Coloration: Skin is not jaundiced or pale.     Findings: No bruising, erythema, lesion or rash.     Comments: Hyperpigmented lesion on medial R foot below medial maleolus  Neurological:     General: No focal deficit present.     Mental Status: She is alert and oriented to person, place, and time. Mental status is at baseline.     Cranial Nerves: No cranial nerve deficit.     Sensory: No sensory deficit.     Motor: No weakness.     Coordination: Coordination normal.     Gait: Gait  normal.     Deep Tendon Reflexes: Reflexes normal.  Psychiatric:        Mood and Affect: Mood normal.        Behavior: Behavior normal.        Thought Content: Thought content normal.        Judgment: Judgment normal.     Results for orders placed or performed in visit on 07/06/19  Novel Coronavirus, NAA ([facility])   Specimen: Nasopharyngeal(NP) swabs in vial transport medium   NASOPHARYNGE  TESTING  Result Value Ref Range   SARS-CoV-2, NAA Not Detected Not Detected      Assessment & Plan:   Problem List Items Addressed This Visit    None    Visit Diagnoses    Routine general medical examination at a health care facility    -  Primary   Vaccines up to date. Screening labs checked today. Pap  N/A. Mammogram and colonscopy ordered. Continue diet and exercise. Call with any concerns.    Relevant Orders   Comprehensive metabolic panel   CBC with Differential/Platelet   Lipid Panel w/o Chol/HDL Ratio   TSH   Urinalysis, Routine w reflex microscopic   Hepatitis C Antibody   Neoplasm of uncertain behavior of skin       Will return for shave bx asap   Flu vaccine need       Flu shot given today.   Relevant Orders   Flu Vaccine QUAD 36+ mos IM (Completed)   Need for hepatitis C screening test       Labs drawn today. Await results.    Screening for colon cancer       Referral to GI placed today.   Relevant Orders   Ambulatory referral to Gastroenterology       Follow up plan: Return ASAP shave biopsy.   LABORATORY TESTING:  - Pap smear: not applicable  IMMUNIZATIONS:   - Tdap: Tetanus vaccination status reviewed: last tetanus booster within 10 years. - Influenza: Administered today - Pneumovax: Not applicable - COVID: Up to date  SCREENING: -Mammogram: Ordered today  - Colonoscopy: ordered today   PATIENT COUNSELING:   Advised to take 1 mg of folate supplement per day if capable of pregnancy.   Sexuality: Discussed sexually transmitted diseases, partner selection, use of condoms, avoidance of unintended pregnancy  and contraceptive alternatives.   Advised to avoid cigarette smoking.  I discussed with the patient that most people either abstain from alcohol or drink within safe limits (<=14/week and <=4 drinks/occasion for males, <=7/weeks and <= 3 drinks/occasion for females) and that the risk for alcohol disorders and other health effects rises proportionally with the number of drinks per week and how often a drinker exceeds daily limits.  Discussed cessation/primary prevention of drug use and availability of treatment for abuse.   Diet: Encouraged to adjust caloric intake to maintain  or achieve ideal body weight, to reduce intake of dietary saturated fat and  total fat, to limit sodium intake by avoiding high sodium foods and not adding table salt, and to maintain adequate dietary potassium and calcium preferably from fresh fruits, vegetables, and low-fat dairy products.    stressed the importance of regular exercise  Injury prevention: Discussed safety belts, safety helmets, smoke detector, smoking near bedding or upholstery.   Dental health: Discussed importance of regular tooth brushing, flossing, and dental visits.    NEXT PREVENTATIVE PHYSICAL DUE IN 1 YEAR. Return ASAP shave biopsy.

## 2019-12-26 NOTE — Patient Instructions (Addendum)
Health Maintenance, Female Adopting a healthy lifestyle and getting preventive care are important in promoting health and wellness. Ask your health care provider about:  The right schedule for you to have regular tests and exams.  Things you can do on your own to prevent diseases and keep yourself healthy. What should I know about diet, weight, and exercise? Eat a healthy diet   Eat a diet that includes plenty of vegetables, fruits, low-fat dairy products, and lean protein.  Do not eat a lot of foods that are high in solid fats, added sugars, or sodium. Maintain a healthy weight Body mass index (BMI) is used to identify weight problems. It estimates body fat based on height and weight. Your health care provider can help determine your BMI and help you achieve or maintain a healthy weight. Get regular exercise Get regular exercise. This is one of the most important things you can do for your health. Most adults should:  Exercise for at least 150 minutes each week. The exercise should increase your heart rate and make you sweat (moderate-intensity exercise).  Do strengthening exercises at least twice a week. This is in addition to the moderate-intensity exercise.  Spend less time sitting. Even light physical activity can be beneficial. Watch cholesterol and blood lipids Have your blood tested for lipids and cholesterol at 50 years of age, then have this test every 5 years. Have your cholesterol levels checked more often if:  Your lipid or cholesterol levels are high.  You are older than 50 years of age.  You are at high risk for heart disease. What should I know about cancer screening? Depending on your health history and family history, you may need to have cancer screening at various ages. This may include screening for:  Breast cancer.  Cervical cancer.  Colorectal cancer.  Skin cancer.  Lung cancer. What should I know about heart disease, diabetes, and high blood  pressure? Blood pressure and heart disease  High blood pressure causes heart disease and increases the risk of stroke. This is more likely to develop in people who have high blood pressure readings, are of African descent, or are overweight.  Have your blood pressure checked: ? Every 3-5 years if you are 18-39 years of age. ? Every year if you are 40 years old or older. Diabetes Have regular diabetes screenings. This checks your fasting blood sugar level. Have the screening done:  Once every three years after age 40 if you are at a normal weight and have a low risk for diabetes.  More often and at a younger age if you are overweight or have a high risk for diabetes. What should I know about preventing infection? Hepatitis B If you have a higher risk for hepatitis B, you should be screened for this virus. Talk with your health care provider to find out if you are at risk for hepatitis B infection. Hepatitis C Testing is recommended for:  Everyone born from 1945 through 1965.  Anyone with known risk factors for hepatitis C. Sexually transmitted infections (STIs)  Get screened for STIs, including gonorrhea and chlamydia, if: ? You are sexually active and are younger than 50 years of age. ? You are older than 50 years of age and your health care provider tells you that you are at risk for this type of infection. ? Your sexual activity has changed since you were last screened, and you are at increased risk for chlamydia or gonorrhea. Ask your health care provider if   you are at risk.  Ask your health care provider about whether you are at high risk for HIV. Your health care provider may recommend a prescription medicine to help prevent HIV infection. If you choose to take medicine to prevent HIV, you should first get tested for HIV. You should then be tested every 3 months for as long as you are taking the medicine. Pregnancy  If you are about to stop having your period (premenopausal) and  you may become pregnant, seek counseling before you get pregnant.  Take 400 to 800 micrograms (mcg) of folic acid every day if you become pregnant.  Ask for birth control (contraception) if you want to prevent pregnancy. Osteoporosis and menopause Osteoporosis is a disease in which the bones lose minerals and strength with aging. This can result in bone fractures. If you are 65 years old or older, or if you are at risk for osteoporosis and fractures, ask your health care provider if you should:  Be screened for bone loss.  Take a calcium or vitamin D supplement to lower your risk of fractures.  Be given hormone replacement therapy (HRT) to treat symptoms of menopause. Follow these instructions at home: Lifestyle  Do not use any products that contain nicotine or tobacco, such as cigarettes, e-cigarettes, and chewing tobacco. If you need help quitting, ask your health care provider.  Do not use street drugs.  Do not share needles.  Ask your health care provider for help if you need support or information about quitting drugs. Alcohol use  Do not drink alcohol if: ? Your health care provider tells you not to drink. ? You are pregnant, may be pregnant, or are planning to become pregnant.  If you drink alcohol: ? Limit how much you use to 0-1 drink a day. ? Limit intake if you are breastfeeding.  Be aware of how much alcohol is in your drink. In the [place] one drink equals one 12 oz bottle of beer (355 mL), one 5 oz glass of wine (148 mL), or one 1 oz glass of hard liquor (44 mL). General instructions  Schedule regular health, dental, and eye exams.  Stay current with your vaccines.  Tell your health care provider if: ? You often feel depressed. ? You have ever been abused or do not feel safe at home. Summary  Adopting a healthy lifestyle and getting preventive care are important in promoting health and wellness.  Follow your health care provider's instructions about healthy  diet, exercising, and getting tested or screened for diseases.  Follow your health care provider's instructions on monitoring your cholesterol and blood pressure. This information is not intended to replace advice given to you by your health care provider. Make sure you discuss any questions you have with your health care provider. Document Revised: 03/17/2018 Document Reviewed: 03/17/2018 Elsevier Patient Education  2020 [employer] Inc[employer]  

## 2019-12-27 ENCOUNTER — Other Ambulatory Visit: Payer: Self-pay | Admitting: Podiatry

## 2019-12-27 ENCOUNTER — Other Ambulatory Visit: Payer: Self-pay

## 2019-12-27 ENCOUNTER — Ambulatory Visit (INDEPENDENT_AMBULATORY_CARE_PROVIDER_SITE_OTHER): Payer: 59 | Admitting: Podiatry

## 2019-12-27 DIAGNOSIS — M76821 Posterior tibial tendinitis, right leg: Secondary | ICD-10-CM

## 2019-12-27 DIAGNOSIS — Z9889 Other specified postprocedural states: Secondary | ICD-10-CM

## 2019-12-27 DIAGNOSIS — Q742 Other congenital malformations of lower limb(s), including pelvic girdle: Secondary | ICD-10-CM

## 2019-12-27 MED ORDER — MELOXICAM 15 MG PO TABS: 15.0000 mg | ORAL_TABLET | Freq: Every day | ORAL | 1 refills | Status: DC

## 2019-12-27 NOTE — Progress Notes (Signed)
   Subjective:  Patient presents today status post navicular exostectomy with repair posterior tibial tendon right foot. DOS: 09/29/2019.  Overall the patient is doing much better.  Patient went to physical therapy and finished her physical therapy sessions.  She notices some modest improvement.  She is currently walking in shoes and sandals with minimal pain to the foot and ankle.  No new complaints at this time.  She is scheduled to go back to work 12/28/2019  Past Medical History:  Diagnosis Date  . Allergic rhinitis   . Anxiety   . Breast lump on right side at 8 o'clock position 12/06/2014  . Depression   . History of skin cancer   . Hx of migraine headaches    light sensivity, unilateral HA  . Urticaria       Objective/Physical Exam Neurovascular status intact.  Skin incisions appear to be well coapted and healed. No sign of infectious process noted. No dehiscence. No active bleeding noted.  There is no erythema however there is some moderate edema noted to the surgical extremity that appears to be chronic.  Muscle strength 5/5 all compartments.  There are some very minimal tenderness to palpation along the posterior tibial tendon sheath.  Assessment: 1. s/p navicular exostectomy with repair of posterior tibial tendon right. DOS: 09/29/2019   Plan of Care:  1. Patient was evaluated.  2.  Recommend good supportive sneakers and insoles 3.  Prescription for meloxicam 15 mg as needed pain 4.  Continue wearing compression ankle sleeves 5.  Patient may now resume full activity no restrictions 6.  Return to clinic as needed   *Works environmental at [employer], DPM [facility]  Dr. Edrick Kins, [address]                                        [place], [place] [address]                Office [phone]  Fax [phone]

## 2019-12-28 LAB — CBC WITH DIFFERENTIAL/PLATELET
Basophils Absolute: 0 10*3/uL (ref 0.0–0.2)
Basos: 0 %
EOS (ABSOLUTE): 0.2 10*3/uL (ref 0.0–0.4)
Eos: 5 %
Hematocrit: 36.1 % (ref 34.0–46.6)
Hemoglobin: 11.9 g/dL (ref 11.1–15.9)
Immature Grans (Abs): 0 10*3/uL (ref 0.0–0.1)
Immature Granulocytes: 0 %
Lymphocytes Absolute: 1.7 10*3/uL (ref 0.7–3.1)
Lymphs: 35 %
MCH: 29.2 pg (ref 26.6–33.0)
MCHC: 33 g/dL (ref 31.5–35.7)
MCV: 89 fL (ref 79–97)
Monocytes Absolute: 0.3 10*3/uL (ref 0.1–0.9)
Monocytes: 6 %
Neutrophils Absolute: 2.7 10*3/uL (ref 1.4–7.0)
Neutrophils: 54 %
Platelets: 177 10*3/uL (ref 150–450)
RBC: 4.07 x10E6/uL (ref 3.77–5.28)
RDW: 13 % (ref 11.7–15.4)
WBC: 5 10*3/uL (ref 3.4–10.8)

## 2019-12-28 LAB — COMPREHENSIVE METABOLIC PANEL
ALT: 27 IU/L (ref 0–32)
AST: 20 IU/L (ref 0–40)
Albumin/Globulin Ratio: 1.4 (ref 1.2–2.2)
Albumin: 4 g/dL (ref 3.8–4.8)
Alkaline Phosphatase: 66 IU/L (ref 44–121)
BUN/Creatinine Ratio: 25 — ABNORMAL HIGH (ref 9–23)
BUN: 19 mg/dL (ref 6–24)
Bilirubin Total: 0.4 mg/dL (ref 0.0–1.2)
CO2: 24 mmol/L (ref 20–29)
Calcium: 9.2 mg/dL (ref 8.7–10.2)
Chloride: 104 mmol/L (ref 96–106)
Creatinine, Ser: 0.76 mg/dL (ref 0.57–1.00)
GFR calc Af Amer: 107 mL/min/{1.73_m2} (ref >59)
GFR calc non Af Amer: 92 mL/min/{1.73_m2} (ref >59)
Globulin, Total: 2.8 g/dL (ref 1.5–4.5)
Glucose: 84 mg/dL (ref 65–99)
Potassium: 4 mmol/L (ref 3.5–5.2)
Sodium: 141 mmol/L (ref 134–144)
Total Protein: 6.8 g/dL (ref 6.0–8.5)

## 2019-12-28 LAB — LIPID PANEL W/O CHOL/HDL RATIO
Cholesterol, Total: 191 mg/dL (ref 100–199)
HDL: 54 mg/dL (ref >39)
LDL Chol Calc (NIH): 104 mg/dL — ABNORMAL HIGH (ref 0–99)
Triglycerides: 191 mg/dL — ABNORMAL HIGH (ref 0–149)
VLDL Cholesterol Cal: 33 mg/dL (ref 5–40)

## 2019-12-28 LAB — TSH: TSH: 1.86 u[IU]/mL (ref 0.450–4.500)

## 2019-12-28 LAB — HEPATITIS C ANTIBODY: Hep C Virus Ab: <0.1 s/co ratio (ref 0.0–0.9)

## 2020-01-06 ENCOUNTER — Other Ambulatory Visit: Payer: Self-pay

## 2020-01-06 ENCOUNTER — Encounter: Payer: Self-pay | Admitting: *Deleted

## 2020-01-06 ENCOUNTER — Encounter: Payer: Self-pay | Admitting: Podiatry

## 2020-01-06 ENCOUNTER — Ambulatory Visit (INDEPENDENT_AMBULATORY_CARE_PROVIDER_SITE_OTHER): Payer: 59 | Admitting: Podiatry

## 2020-01-06 DIAGNOSIS — R600 Localized edema: Secondary | ICD-10-CM | POA: Diagnosis not present

## 2020-01-06 DIAGNOSIS — Z9889 Other specified postprocedural states: Secondary | ICD-10-CM

## 2020-01-06 DIAGNOSIS — M76821 Posterior tibial tendinitis, right leg: Secondary | ICD-10-CM | POA: Diagnosis not present

## 2020-01-06 DIAGNOSIS — Q742 Other congenital malformations of lower limb(s), including pelvic girdle: Secondary | ICD-10-CM

## 2020-01-06 DIAGNOSIS — M21371 Foot drop, right foot: Secondary | ICD-10-CM

## 2020-01-06 DIAGNOSIS — G5791 Unspecified mononeuropathy of right lower limb: Secondary | ICD-10-CM | POA: Diagnosis not present

## 2020-01-10 NOTE — Progress Notes (Signed)
   Subjective:  Patient presents today status post navicular exostectomy with repair posterior tibial tendon right foot. DOS: 09/29/2019.  Since last visit the patient has returned to work and she has noticed a significant increase in pain and swelling of her leg.  She states that her leg goes numb and she feels like she is falling.  This occurs after walking for a short time at work.  She also has noticed an increase amount of swelling and pain to the right leg in general.  She experiences sharp pains and is very symptomatic.  She has been icing her leg and taking meloxicam as prescribed.  Past Medical History:  Diagnosis Date  . Allergic rhinitis   . Anxiety   . Breast lump on right side at 8 o'clock position 12/06/2014  . Depression   . History of skin cancer   . Hx of migraine headaches    light sensivity, unilateral HA  . Urticaria    Objective: Physical Exam General: The patient is alert and oriented x3 in no acute distress.  Dermatology: Skin is cool, dry and supple bilateral lower extremities. Negative for open lesions or macerations.  Vascular: Palpable pedal pulses bilaterally.  There is a moderate amount of edema noted to the right lower extremity past the level of the knee.  Capillary refill within normal limits.  Neurological: Epicritic and protective threshold grossly intact bilaterally.  No paresthesia noted  Musculoskeletal Exam: All pedal and ankle joints range of motion within normal limits bilateral. H/o navicular exostectomy with repair PT tendon right.  Patient continues to have some moderate tenderness to the surgical site.  Today she has exquisite pain with calf compression, positive Homans' sign.  Dorsiflexion and eversion of the foot in relationship to the leg is limited with loss of muscle strength against resistance.    Assessment: 1. s/p navicular exostectomy with repair of posterior tibial tendon right. DOS: 09/29/2019 2.  Possible DVT right lower  extremity 3.  Edema RLE 4.  Neuritis/possible nerve entrapment with small manifestations of foot drop RLE   Plan of Care:  1. Patient was evaluated.  2.  Today we are going to place an order for a venous Doppler to rule out DVT 3.  Also order placed for nerve conduction velocity test to rule out any peroneal nerve entrapment or pathology 4.  Continue wearing ankle brace daily 5.  Return to clinic in 3 weeks to review results  *Works environmental at [employer], DPM [facility]  Dr. Edrick Kins, [address]                                        [place], [place] [address]                Office [phone]  Fax [phone]

## 2020-01-20 ENCOUNTER — Other Ambulatory Visit: Payer: Self-pay

## 2020-01-20 ENCOUNTER — Encounter: Payer: Self-pay | Admitting: Podiatry

## 2020-01-20 ENCOUNTER — Ambulatory Visit (INDEPENDENT_AMBULATORY_CARE_PROVIDER_SITE_OTHER): Payer: 59 | Admitting: Podiatry

## 2020-01-20 DIAGNOSIS — G5791 Unspecified mononeuropathy of right lower limb: Secondary | ICD-10-CM | POA: Diagnosis not present

## 2020-01-20 DIAGNOSIS — R600 Localized edema: Secondary | ICD-10-CM | POA: Diagnosis not present

## 2020-01-20 DIAGNOSIS — M21371 Foot drop, right foot: Secondary | ICD-10-CM

## 2020-01-20 DIAGNOSIS — M76821 Posterior tibial tendinitis, right leg: Secondary | ICD-10-CM

## 2020-01-20 DIAGNOSIS — M79676 Pain in unspecified toe(s): Secondary | ICD-10-CM

## 2020-01-20 NOTE — Progress Notes (Signed)
   Subjective:  Patient presents today status post navicular exostectomy with repair posterior tibial tendon right foot. DOS: 09/29/2019.  Since the last visit the patient has not had the venous Doppler or nerve conduction velocity test scheduled or performed yet.  She continues to feel improvement with just simple rest however she still has sharp shooting pain sensations and has been no change in her symptoms since last visit.  Past Medical History:  Diagnosis Date  . Allergic rhinitis   . Anxiety   . Breast lump on right side at 8 o'clock position 12/06/2014  . Depression   . History of skin cancer   . Hx of migraine headaches    light sensivity, unilateral HA  . Urticaria    Objective: Physical Exam General: The patient is alert and oriented x3 in no acute distress.  Dermatology: Skin is cool, dry and supple bilateral lower extremities. Negative for open lesions or macerations.  Vascular: Palpable pedal pulses bilaterally.  There is a moderate amount of edema noted to the right lower extremity past the level of the knee.  Capillary refill within normal limits.  Neurological: Epicritic and protective threshold grossly intact bilaterally.  No paresthesia noted  Musculoskeletal Exam: All pedal and ankle joints range of motion within normal limits bilateral. H/o navicular exostectomy with repair PT tendon right.  Patient continues to have some moderate tenderness to the surgical site.  Today she has exquisite pain with calf compression, positive Homans' sign.  Dorsiflexion and eversion of the foot in relationship to the leg is limited with loss of muscle strength against resistance.    Assessment: 1. s/p navicular exostectomy with repair of posterior tibial tendon right. DOS: 09/29/2019 2.  Possible DVT right lower extremity 3.  Edema RLE 4.  Neuritis/possible nerve entrapment with small manifestations of foot drop RLE   Plan of Care:  1. Patient was evaluated.  2.  The order for  the DVT test as well as the nerve conduction velocity test is pending 3.  Continue rest daily. 4.  Continue ankle brace daily or compression ankle sleeve 5.  Return to clinic after nerve conduction velocity test and DVT test to review results  *Works environmental at [employer], DPM [facility]  Dr. Edrick Kins, [place]                                        [place], [place] [address]                Office [phone]  Fax [phone]

## 2020-01-23 ENCOUNTER — Ambulatory Visit: Payer: 59 | Admitting: Family Medicine

## 2020-01-24 ENCOUNTER — Ambulatory Visit (INDEPENDENT_AMBULATORY_CARE_PROVIDER_SITE_OTHER): Payer: 59

## 2020-01-24 ENCOUNTER — Other Ambulatory Visit: Payer: Self-pay

## 2020-01-24 DIAGNOSIS — R600 Localized edema: Secondary | ICD-10-CM | POA: Diagnosis not present

## 2020-01-25 ENCOUNTER — Encounter: Payer: Self-pay | Admitting: Neurology

## 2020-01-25 ENCOUNTER — Other Ambulatory Visit: Payer: Self-pay

## 2020-01-25 DIAGNOSIS — M21371 Foot drop, right foot: Secondary | ICD-10-CM

## 2020-01-25 DIAGNOSIS — G5791 Unspecified mononeuropathy of right lower limb: Secondary | ICD-10-CM

## 2020-02-14 ENCOUNTER — Encounter: Payer: Self-pay | Admitting: Family Medicine

## 2020-02-14 ENCOUNTER — Other Ambulatory Visit (HOSPITAL_COMMUNITY)
Admission: RE | Admit: 2020-02-14 | Discharge: 2020-02-14 | Disposition: A | Discharge status: Home / Self Care | Payer: 59 | Source: Ambulatory Visit | Attending: Family Medicine | Admitting: Family Medicine

## 2020-02-14 ENCOUNTER — Other Ambulatory Visit: Payer: Self-pay

## 2020-02-14 ENCOUNTER — Ambulatory Visit: Payer: 59 | Admitting: Family Medicine

## 2020-02-14 VITALS — BP 106/72 | HR 88 | Temp 98.3°F | Wt 165.2 lb

## 2020-02-14 DIAGNOSIS — D239 Other benign neoplasm of skin, unspecified: Secondary | ICD-10-CM

## 2020-02-14 DIAGNOSIS — D485 Neoplasm of uncertain behavior of skin: Secondary | ICD-10-CM | POA: Insufficient documentation

## 2020-02-14 DIAGNOSIS — D2371 Other benign neoplasm of skin of right lower limb, including hip: Secondary | ICD-10-CM | POA: Diagnosis not present

## 2020-02-14 HISTORY — DX: Other benign neoplasm of skin, unspecified: D23.9

## 2020-02-14 NOTE — Progress Notes (Signed)
BP 106/72   Pulse 88   Temp 98.3 F (36.8 C)   Wt 165 lb 3.2 oz (74.9 kg)   SpO2 99%   BMI 28.36 kg/m    Subjective:    Patient ID: Karen Mess., female    DOB: [birth year], 49 y.o.   MRN: [id number]  HPI: Karen Walker is a 49 y.o. female  Chief Complaint  Patient presents with  . shave biopsy   SKIN LESION Duration: about 5 months Location: R foot Painful: no Itching: no Onset: gradual Context: getting darker Associated signs and symptoms: none History of skin cancer: yes History of precancerous skin lesions: yes Family history of skin cancer: yes  Relevant past medical, surgical, family and social history reviewed and updated as indicated. Interim medical history since our last visit reviewed. Allergies and medications reviewed and updated.  Review of Systems  Constitutional: Negative.   Respiratory: Negative.   Cardiovascular: Negative.   Musculoskeletal: Negative.   Skin: Negative.   Psychiatric/Behavioral: Negative.     Per HPI unless specifically indicated above     Objective:    BP 106/72   Pulse 88   Temp 98.3 F (36.8 C)   Wt 165 lb 3.2 oz (74.9 kg)   SpO2 99%   BMI 28.36 kg/m   Wt Readings from Last 3 Encounters:  02/14/20 165 lb 3.2 oz (74.9 kg)  12/26/19 165 lb (74.8 kg)  09/06/19 159 lb 3.2 oz (72.2 kg)    Physical Exam Vitals and nursing note reviewed.  Constitutional:      General: She is not in acute distress.    Appearance: Normal appearance. She is not ill-appearing, toxic-appearing or diaphoretic.  HENT:     Head: Normocephalic and atraumatic.     Right Ear: External ear normal.     Left Ear: External ear normal.     Nose: Nose normal.     Mouth/Throat:     Mouth: Mucous membranes are moist.     Pharynx: Oropharynx is clear.  Eyes:     General: No scleral icterus.       Right eye: No discharge.        Left eye: No discharge.     Extraocular Movements: Extraocular movements intact.     Conjunctiva/sclera:  Conjunctivae normal.     Pupils: Pupils are equal, round, and reactive to light.  Cardiovascular:     Rate and Rhythm: Normal rate and regular rhythm.     Pulses: Normal pulses.     Heart sounds: Normal heart sounds. No murmur heard.  No friction rub. No gallop.   Pulmonary:     Effort: Pulmonary effort is normal. No respiratory distress.     Breath sounds: Normal breath sounds. No stridor. No wheezing, rhonchi or rales.  Chest:     Chest wall: No tenderness.  Musculoskeletal:        General: Normal range of motion.     Cervical back: Normal range of motion and neck supple.  Skin:    General: Skin is warm and dry.     Capillary Refill: Capillary refill takes less than 2 seconds.     Coloration: Skin is not jaundiced or pale.     Findings: No bruising, erythema, lesion or rash.     Comments: 1.5cm hyperpigmented irregular lesion on medial R foot inferior to her medial maleolus  Neurological:     General: No focal deficit present.     Mental Status: She is alert and  oriented to person, place, and time. Mental status is at baseline.  Psychiatric:        Mood and Affect: Mood normal.        Behavior: Behavior normal.        Thought Content: Thought content normal.        Judgment: Judgment normal.     Results for orders placed or performed in visit on 12/26/19  Microscopic Examination   Urine  Result Value Ref Range   WBC, UA None seen 0 - 5 /hpf   RBC 0-2 0 - 2 /hpf   Epithelial Cells (non renal) 0-10 0 - 10 /hpf   Bacteria, UA None seen None seen/Few  Comprehensive metabolic panel  Result Value Ref Range   Glucose 84 65 - 99 mg/dL   BUN 19 6 - 24 mg/dL   Creatinine, Ser 0.76 0.57 - 1.00 mg/dL   GFR calc non Af Amer 92 >59 mL/min/1.73   GFR calc Af Amer 107 >59 mL/min/1.73   BUN/Creatinine Ratio 25 (H) 9 - 23   Sodium 141 134 - 144 mmol/L   Potassium 4.0 3.5 - 5.2 mmol/L   Chloride 104 96 - 106 mmol/L   CO2 24 20 - 29 mmol/L   Calcium 9.2 8.7 - 10.2 mg/dL   Total  Protein 6.8 6.0 - 8.5 g/dL   Albumin 4.0 3.8 - 4.8 g/dL   Globulin, Total 2.8 1.5 - 4.5 g/dL   Albumin/Globulin Ratio 1.4 1.2 - 2.2   Bilirubin Total 0.4 0.0 - 1.2 mg/dL   Alkaline Phosphatase 66 44 - 121 IU/L   AST 20 0 - 40 IU/L   ALT 27 0 - 32 IU/L  CBC with Differential/Platelet  Result Value Ref Range   WBC 5.0 3.4 - 10.8 x10E3/uL   RBC 4.07 3.77 - 5.28 x10E6/uL   Hemoglobin 11.9 11.1 - 15.9 g/dL   Hematocrit 36.1 34.0 - 46.6 %   MCV 89 79 - 97 fL   MCH 29.2 26.6 - 33.0 pg   MCHC 33.0 31 - 35 g/dL   RDW 13.0 11.7 - 15.4 %   Platelets 177 150 - 450 x10E3/uL   Neutrophils 54 Not Estab. %   Lymphs 35 Not Estab. %   Monocytes 6 Not Estab. %   Eos 5 Not Estab. %   Basos 0 Not Estab. %   Neutrophils Absolute 2.7 1.40 - 7.00 x10E3/uL   Lymphocytes Absolute 1.7 0 - 3 x10E3/uL   Monocytes Absolute 0.3 0 - 0 x10E3/uL   EOS (ABSOLUTE) 0.2 0.0 - 0.4 x10E3/uL   Basophils Absolute 0.0 0 - 0 x10E3/uL   Immature Granulocytes 0 Not Estab. %   Immature Grans (Abs) 0.0 0.0 - 0.1 x10E3/uL  Lipid Panel w/o Chol/HDL Ratio  Result Value Ref Range   Cholesterol, Total 191 100 - 199 mg/dL   Triglycerides 191 (H) 0 - 149 mg/dL   HDL 54 >39 mg/dL   VLDL Cholesterol Cal 33 5 - 40 mg/dL   LDL Chol Calc (NIH) 104 (H) 0 - 99 mg/dL  TSH  Result Value Ref Range   TSH 1.860 0.450 - 4.500 uIU/mL  Urinalysis, Routine w reflex microscopic  Result Value Ref Range   Specific Gravity, UA 1.025 1.005 - 1.030   pH, UA 5.5 5.0 - 7.5   Color, UA Yellow Yellow   Appearance Ur Clear Clear   Leukocytes,UA Negative Negative   Protein,UA Negative Negative/Trace   Glucose, UA Negative Negative   Ketones,  UA Negative Negative   RBC, UA Trace (A) Negative   Bilirubin, UA Negative Negative   Urobilinogen, Ur 0.2 0.2 - 1.0 mg/dL   Nitrite, UA Negative Negative   Microscopic Examination See below:   Hepatitis C Antibody  Result Value Ref Range   Hep C Virus Ab <0.1 0.0 - 0.9 s/co ratio      Assessment &  Plan:   Problem List Items Addressed This Visit    None    Visit Diagnoses    Neoplasm of uncertain behavior of skin    -  Primary   Removed today as below.    Relevant Orders   Surgical pathology      Skin Procedure  Procedure: Informed consent given.  Sterile prep of the area.  Area infiltrated with lidocaine with epinephrine.  Using a surgical blade, part of the upper dermis shaved off and sent  for pathology.  Area cauterized. Pt ed on scarring.     Diagnosis:   ICD-10-CM   1. Neoplasm of uncertain behavior of skin  D48.5 Surgical pathology   Removed today as below.     Lesion Location/Size: 1.5cm hyperpigmented irregular lesion on medial R foot inferior to her medial maleolus Physician: MJ Consent:  Risks, benefits, and alternative treatments discussed and all questions were answered.  Patient elected to proceed and verbal consent obtained.  Description: Area prepped and draped using semi-sterile technique. Area locally anesthetized using 2 cc's of lidocaine 2% with epi. Shave biopsy of lesion performed using a dermablade.  Adequate hemostastis achieved using Silver Nitrate.  Wound dressed after application of bacitracin ointment  Post Procedure Instructions: Wound care instructions discussed and patient was instructed to keep area clean and dry.  Signs and symptoms of infection discussed, patient agrees to contact the office ASAP should they occur.  Dressing change recommended every other day.   Follow up plan: Return if symptoms worsen or fail to improve.

## 2020-02-16 LAB — SURGICAL PATHOLOGY

## 2020-02-16 NOTE — Progress Notes (Signed)
Please let her know that the mole was abnormal and I'd like to get her back into her dermatologist to make sure they got all of it. It was not cancer, but it was not normal.

## 2020-02-16 NOTE — Addendum Note (Signed)
Addended by: Valerie Roys on: 02/16/2020 04:43 PM   Modules accepted: Orders

## 2020-02-21 ENCOUNTER — Other Ambulatory Visit: Payer: Self-pay | Admitting: Family Medicine

## 2020-02-21 DIAGNOSIS — Z1231 Encounter for screening mammogram for malignant neoplasm of breast: Secondary | ICD-10-CM

## 2020-02-22 ENCOUNTER — Other Ambulatory Visit: Payer: Self-pay | Admitting: Family Medicine

## 2020-03-06 ENCOUNTER — Ambulatory Visit
Admission: RE | Admit: 2020-03-06 | Discharge: 2020-03-06 | Disposition: A | Discharge status: Home / Self Care | Payer: 59 | Source: Ambulatory Visit | Attending: Family Medicine | Admitting: Family Medicine

## 2020-03-06 ENCOUNTER — Other Ambulatory Visit: Payer: Self-pay

## 2020-03-06 DIAGNOSIS — Z1231 Encounter for screening mammogram for malignant neoplasm of breast: Secondary | ICD-10-CM

## 2020-03-12 ENCOUNTER — Encounter: Payer: Self-pay | Admitting: Family Medicine

## 2020-03-14 ENCOUNTER — Other Ambulatory Visit: Payer: Self-pay

## 2020-03-14 ENCOUNTER — Ambulatory Visit (INDEPENDENT_AMBULATORY_CARE_PROVIDER_SITE_OTHER): Payer: 59 | Admitting: Neurology

## 2020-03-14 DIAGNOSIS — R202 Paresthesia of skin: Secondary | ICD-10-CM | POA: Diagnosis not present

## 2020-03-14 NOTE — Procedures (Signed)
[facility]  [address], [place]  [place], [place] [address] Tel: [phone] Fax:  [phone] Test Date:  03/14/2020  Patient: Karen Walker DOB: Aug 03, 1970 Physician: Narda Amber, DO  Sex: Female Height: 5\' 4"  Ref Phys: Daylene Katayama, DPM  ID#: [id number]   Technician:    Patient Complaints: This is a 49 year old female with history of foot surgery referred for evaluation of her right foot pain and weakness.  NCV & EMG Findings: Extensive electrodiagnostic testing of the right lower extremity shows:  1. Right sural and superficial peroneal sensory responses are within normal limits 2. Right peroneal and tibial motor responses are within normal limits. 3. Right tibial H reflex study is within normal limits. 4. Needle electrode examination was prematurely terminated due to pain.  Of the tested muscles, there was no evidence of active or chronic motor axonal loss changes.    Impression: There is no evidence of a sensorimotor or peroneal mononeuropathy affecting the right lower extremity.  Needle electrode study is incomplete, as it was prematurely terminated due to pain.    ___________________________ Narda Amber, DO    Nerve Conduction Studies Anti Sensory Summary Table   Stim Site NR Peak (ms) Norm Peak (ms) P-T Amp (V) Norm P-T Amp  Right Sup Peroneal Anti Sensory (Ant Lat Mall)  32C  12 cm    2.2 <4.6 11.5 >4  Right Sural Anti Sensory (Lat Mall)  32C  Calf    2.3 <4.6 28.1 >4   Motor Summary Table   Stim Site NR Onset (ms) Norm Onset (ms) O-P Amp (mV) Norm O-P Amp Site1 Site2 Delta-0 (ms) Dist (cm) Vel (m/s) Norm Vel (m/s)  Right Peroneal Motor (Ext Dig Brev)  32C  Ankle    3.4 <6.0 2.8 >2.5 B Fib Ankle 6.4 35.0 55 >40  B Fib    9.8  2.4  Poplt B Fib 1.5 8.0 53 >40  Poplt    11.3  2.2         Right Peroneal TA Motor (Tib Ant)  32C  Fib Head    2.1 <4.5 5.7 >3 Poplit Fib Head 1.3 8.0 62 >40  Poplit    3.4  5.7         Right Tibial Motor (Abd  Hall Brev)  32C  Ankle    4.1 <6.0 12.8 >4 Knee Ankle 7.5 38.0 51 >40  Knee    11.6  9.2          H Reflex Studies   NR H-Lat (ms) Lat Norm (ms) L-R H-Lat (ms)  Right Tibial (Gastroc)  32C     31.43 <35    EMG   Side Muscle Ins Act Fibs Psw Fasc Number Recrt Dur Dur. Amp Amp. Poly Poly. Comment  Right AntTibialis Nml Nml Nml Nml Nml Nml Nml Nml Nml Nml Nml Nml N/A  Right Gastroc Nml Nml Nml Nml Nml Nml Nml Nml Nml Nml Nml Nml N/A  Right Fibularis Long Nml Nml Nml Nml Nml Nml Nml Nml Nml Nml Nml Nml N/A      Waveforms:

## 2020-03-16 ENCOUNTER — Ambulatory Visit: Payer: 59 | Admitting: Nurse Practitioner

## 2020-03-16 ENCOUNTER — Encounter: Payer: Self-pay | Admitting: Nurse Practitioner

## 2020-03-16 ENCOUNTER — Other Ambulatory Visit: Payer: Self-pay

## 2020-03-16 VITALS — BP 105/72 | HR 73 | Temp 98.2°F | Ht 63.54 in | Wt 162.0 lb

## 2020-03-16 DIAGNOSIS — N898 Other specified noninflammatory disorders of vagina: Secondary | ICD-10-CM | POA: Insufficient documentation

## 2020-03-16 LAB — URINALYSIS, ROUTINE W REFLEX MICROSCOPIC
Bilirubin, UA: NEGATIVE
Glucose, UA: NEGATIVE
Ketones, UA: NEGATIVE
Leukocytes,UA: NEGATIVE
Nitrite, UA: NEGATIVE
Protein,UA: NEGATIVE
RBC, UA: NEGATIVE
Specific Gravity, UA: 1.02 (ref 1.005–1.030)
Urobilinogen, Ur: 0.2 mg/dL (ref 0.2–1.0)
pH, UA: 6 (ref 5.0–7.5)

## 2020-03-16 LAB — WET PREP FOR TRICH, YEAST, CLUE
Clue Cell Exam: NEGATIVE
Trichomonas Exam: NEGATIVE
Yeast Exam: NEGATIVE

## 2020-03-16 NOTE — Progress Notes (Signed)
BP 105/72   Pulse 73   Temp 98.2 F (36.8 C) (Oral)   Ht 5' 3.54" (1.614 m)   Wt 162 lb (73.5 kg)   SpO2 98%   BMI 28.21 kg/m    Subjective:    Patient ID: Karen Mess., female    DOB: 12-18-1970, 49 y.o.   MRN: [id number]  HPI: Karen Walker is a 49 y.o. female  Chief Complaint  Patient presents with  . Vaginal dryness and discharge    Has been going on all week. Experiencing some burning when scratching.  Karen Walker pain    Started a few days ago, states this is 7/10   VAGINAL ITCHING  Reports ongoing for one week with some discomfort to right lower quadrant which she reports at this time is improving, she wanted to make sure there was no infection.  Does have history of hysterectomy about 11 years ago.  Currently uses Premarin, but no consistently.  She inquired whether she only used this before intercourse, educated her on medication and how/when to use + educated her on vaginal atrophy. Duration: days Discharge description: white  Pruritus: yes Dysuria: no Malodorous: no Urinary frequency: yes Fevers: no Abdominal pain: yes RLQ Sexual activity: monogamous -- she reports but it is too dry, has cream to use, but has not been using regularly History of sexually transmitted diseases: yes, herpes Recent antibiotic use: no Context: stable  Treatments attempted: none  Relevant past medical, surgical, family and social history reviewed and updated as indicated. Interim medical history since our last visit reviewed. Allergies and medications reviewed and updated.  Review of Systems  Constitutional: Negative for activity change, appetite change, diaphoresis, fatigue and fever.  Respiratory: Negative for cough, chest tightness and shortness of breath.   Cardiovascular: Negative for chest pain, palpitations and leg swelling.  Gastrointestinal: Positive for abdominal pain. Negative for abdominal distention, constipation, diarrhea, nausea and vomiting.  Genitourinary: Positive  for frequency and vaginal discharge. Negative for dysuria, hematuria, urgency, vaginal bleeding and vaginal pain.  Neurological: Negative.   Psychiatric/Behavioral: Negative.     Per HPI unless specifically indicated above     Objective:    BP 105/72   Pulse 73   Temp 98.2 F (36.8 C) (Oral)   Ht 5' 3.54" (1.614 m)   Wt 162 lb (73.5 kg)   SpO2 98%   BMI 28.21 kg/m   Wt Readings from Last 3 Encounters:  03/16/20 162 lb (73.5 kg)  02/14/20 165 lb 3.2 oz (74.9 kg)  12/26/19 165 lb (74.8 kg)    Physical Exam Vitals and nursing note reviewed.  Constitutional:      General: She is awake. She is not in acute distress.    Appearance: She is well-developed and well-groomed. She is not ill-appearing.  HENT:     Head: Normocephalic.     Right Ear: Hearing normal.     Left Ear: Hearing normal.  Eyes:     General: Lids are normal.        Right eye: No discharge.        Left eye: No discharge.     Conjunctiva/sclera: Conjunctivae normal.     Pupils: Pupils are equal, round, and reactive to light.  Cardiovascular:     Rate and Rhythm: Normal rate and regular rhythm.     Heart sounds: Normal heart sounds. No murmur heard. No gallop.   Pulmonary:     Effort: Pulmonary effort is normal. No accessory muscle usage or  respiratory distress.     Breath sounds: Normal breath sounds.  Abdominal:     General: Bowel sounds are normal. There is no distension.     Palpations: Abdomen is soft. There is no hepatomegaly or mass.     Tenderness: There is no abdominal tenderness. There is no right CVA tenderness or left CVA tenderness.  Genitourinary:    Pubic Area: No rash or pubic lice.      Labia:        Right: No rash or lesion.        Left: No rash or lesion.      Comments: No erythema to labia or irritation noted on exam. Musculoskeletal:     Cervical back: Normal range of motion and neck supple.     Right lower leg: No edema.     Left lower leg: No edema.  Skin:    General: Skin is  warm and dry.  Neurological:     Mental Status: She is alert and oriented to person, place, and time.  Psychiatric:        Attention and Perception: Attention normal.        Mood and Affect: Mood normal.        Speech: Speech normal.        Behavior: Behavior normal. Behavior is cooperative.        Thought Content: Thought content normal.     Results for orders placed or performed in visit on 03/16/20  WET PREP FOR [name], YEAST, CLUE   Specimen: Sterile Swab   Sterile Swab  Result Value Ref Range   Trichomonas Exam Negative Negative   Yeast Exam Negative Negative   Clue Cell Exam Negative Negative  Urinalysis, Routine w reflex microscopic  Result Value Ref Range   Specific Gravity, UA 1.020 1.005 - 1.030   pH, UA 6.0 5.0 - 7.5   Color, UA Yellow Yellow   Appearance Ur Clear Clear   Leukocytes,UA Negative Negative   Protein,UA Negative Negative/Trace   Glucose, UA Negative Negative   Ketones, UA Negative Negative   RBC, UA Negative Negative   Bilirubin, UA Negative Negative   Urobilinogen, Ur 0.2 0.2 - 1.0 mg/dL   Nitrite, UA Negative Negative      Assessment & Plan:   Problem List Items Addressed This Visit      Musculoskeletal and Integument   Vagina itching - Primary    Acute, with improving abdominal pain at this time.  UA negative and wet prep negative.  No active lesions.  Suspect related to vaginal atrophy and some vaginitis.  Educated her on proper use of Premarin cream, recommend using 1 to 3 times a week regularly to help with vaginal atrophy symptoms.  Recommend [place] of Vagisil OTC for current symptoms.  To return to office for any worsening or ongoing symptoms.      Relevant Orders   WET PREP FOR [name], YEAST, CLUE (Completed)   Urinalysis, Routine w reflex microscopic (Completed)       Follow up plan: Return if symptoms worsen or fail to improve.

## 2020-03-16 NOTE — Assessment & Plan Note (Signed)
Acute, with improving abdominal pain at this time.  UA negative and wet prep negative.  No active lesions.  Suspect related to vaginal atrophy and some vaginitis.  Educated her on proper use of Premarin cream, recommend using 1 to 3 times a week regularly to help with vaginal atrophy symptoms.  Recommend [place] of Vagisil OTC for current symptoms.  To return to office for any worsening or ongoing symptoms.

## 2020-03-16 NOTE — Patient Instructions (Signed)

## 2020-04-26 ENCOUNTER — Ambulatory Visit
Admission: RE | Admit: 2020-04-26 | Discharge: 2020-04-26 | Disposition: A | Discharge status: Home / Self Care | Payer: 59 | Source: Ambulatory Visit | Attending: Family Medicine | Admitting: Family Medicine

## 2020-04-26 ENCOUNTER — Ambulatory Visit: Payer: 59 | Admitting: Family Medicine

## 2020-04-26 ENCOUNTER — Encounter: Payer: Self-pay | Admitting: Family Medicine

## 2020-04-26 ENCOUNTER — Telehealth (INDEPENDENT_AMBULATORY_CARE_PROVIDER_SITE_OTHER): Payer: 59 | Admitting: Family Medicine

## 2020-04-26 DIAGNOSIS — G933 Postviral fatigue syndrome: Secondary | ICD-10-CM | POA: Diagnosis not present

## 2020-04-26 DIAGNOSIS — R0602 Shortness of breath: Secondary | ICD-10-CM | POA: Insufficient documentation

## 2020-04-26 DIAGNOSIS — G9331 Postviral fatigue syndrome: Secondary | ICD-10-CM

## 2020-04-26 NOTE — Progress Notes (Signed)
There were no vitals taken for this visit.   Subjective:    Patient ID: Karen Mess., female    DOB: 1971/03/02, 50 y.o.   MRN: [id number]  HPI: Karen Walker is a 50 y.o. female  Chief Complaint  Patient presents with  . Covid Positive    Pt states she was positive for covid, was told she could go back to work tomorrow but still feels very tired and weak. Patient states she is having loose stools after eating, headache, and low appetite.    UPPER RESPIRATORY TRACT INFECTION Duration: Diagnosed on 1/5, about 2 weeks Worst symptom: extra gas, diarrhea after eating Fever: no Cough: yes Shortness of breath: yes Wheezing: no Chest pain: yes Chest tightness: no Chest congestion: no Nasal congestion: no Runny nose: yes Post nasal drip: no Sneezing: no Sore throat: no Swollen glands: no Sinus pressure: no Headache: yes Face pain: yes Toothache: no Ear pain: no  Ear pressure: no  Eyes red/itching:no Eye drainage/crusting: no  Vomiting: no Rash: no Fatigue: yes Sick contacts: yes Strep contacts: no  Context: stable Recurrent sinusitis: no Relief with OTC cold/cough medications: no  Treatments attempted: none   Relevant past medical, surgical, family and social history reviewed and updated as indicated. Interim medical history since our last visit reviewed. Allergies and medications reviewed and updated.  Review of Systems  Constitutional: Positive for fatigue. Negative for activity change, appetite change, chills, diaphoresis, fever and unexpected weight change.  HENT: Negative.   Respiratory: Positive for cough, chest tightness and shortness of breath. Negative for apnea, choking, wheezing and stridor.   Cardiovascular: Negative.   Gastrointestinal: Negative.   Psychiatric/Behavioral: Negative.     Per HPI unless specifically indicated above     Objective:    There were no vitals taken for this visit.  Wt Readings from Last 3 Encounters:  03/16/20 162  lb (73.5 kg)  02/14/20 165 lb 3.2 oz (74.9 kg)  12/26/19 165 lb (74.8 kg)    Physical Exam Vitals and nursing note reviewed.  Pulmonary:     Effort: Pulmonary effort is normal. No respiratory distress.     Comments: Speaking in full sentences Neurological:     Mental Status: She is alert.  Psychiatric:        Mood and Affect: Mood normal.        Behavior: Behavior normal.        Thought Content: Thought content normal.        Judgment: Judgment normal.     Results for orders placed or performed in visit on 03/16/20  WET PREP FOR [place], YEAST, CLUE   Specimen: Sterile Swab   Sterile Swab  Result Value Ref Range   Trichomonas Exam Negative Negative   Yeast Exam Negative Negative   Clue Cell Exam Negative Negative  Urinalysis, Routine w reflex microscopic  Result Value Ref Range   Specific Gravity, UA 1.020 1.005 - 1.030   pH, UA 6.0 5.0 - 7.5   Color, UA Yellow Yellow   Appearance Ur Clear Clear   Leukocytes,UA Negative Negative   Protein,UA Negative Negative/Trace   Glucose, UA Negative Negative   Ketones, UA Negative Negative   RBC, UA Negative Negative   Bilirubin, UA Negative Negative   Urobilinogen, Ur 0.2 0.2 - 1.0 mg/dL   Nitrite, UA Negative Negative      Assessment & Plan:   Problem List Items Addressed This Visit   None   Visit Diagnoses    SOB (  shortness of breath)    -  Primary   Will check CXR and treat as needed. Await results.    Relevant Orders   DG Chest 2 View   Postviral fatigue syndrome       Await CXR. Out of work for another few days. Call with any concerns.        Follow up plan: Return in about 2 weeks (around 05/10/2020), or if symptoms worsen or fail to improve.   . This visit was completed via MyChart due to the restrictions of the COVID-19 pandemic. All issues as above were discussed and addressed. Physical exam was done as above through visual confirmation on MyChart. If it was felt that the patient should be evaluated in the  office, they were directed there. The patient verbally consented to this visit. . Location of the patient: home . Location of the provider: work . Those involved with this call:  . Provider: Park Liter, DO . CMA: Louanna Raw, [place] [employer] [place] . Front Desk/Registration: Jill Side  . Time spent on call: 21 minutes on the phone discussing health concerns. 30 minutes total spent in review of patient's record and preparation of their chart.

## 2020-04-27 ENCOUNTER — Telehealth: Payer: Self-pay | Admitting: *Deleted

## 2020-04-27 ENCOUNTER — Other Ambulatory Visit: Payer: Self-pay | Admitting: Family Medicine

## 2020-04-27 MED ORDER — AZITHROMYCIN 250 MG PO TABS: ORAL_TABLET | ORAL | 0 refills | Status: DC

## 2020-04-27 NOTE — Telephone Encounter (Signed)
[facility]: Chest X ray results are ready and in Epic:  Mild airways thickening and some streaky opacities in the periphery of the left lung base. Could reflect some residual infectious or inflammatory sequela or atelectatic change.

## 2020-05-11 ENCOUNTER — Ambulatory Visit: Payer: 59 | Admitting: Family Medicine

## 2020-05-11 ENCOUNTER — Ambulatory Visit (INDEPENDENT_AMBULATORY_CARE_PROVIDER_SITE_OTHER): Payer: 59 | Admitting: Family Medicine

## 2020-05-11 ENCOUNTER — Encounter: Payer: Self-pay | Admitting: Family Medicine

## 2020-05-11 ENCOUNTER — Other Ambulatory Visit: Payer: Self-pay | Admitting: Family Medicine

## 2020-05-11 ENCOUNTER — Other Ambulatory Visit: Payer: Self-pay

## 2020-05-11 VITALS — BP 108/73 | HR 81 | Temp 98.7°F | Wt 164.0 lb

## 2020-05-11 DIAGNOSIS — U071 COVID-19: Secondary | ICD-10-CM

## 2020-05-11 DIAGNOSIS — J1282 Pneumonia due to coronavirus disease 2019: Secondary | ICD-10-CM | POA: Diagnosis not present

## 2020-05-11 MED ORDER — ALBUTEROL SULFATE HFA 108 (90 BASE) MCG/ACT IN AERS: 2.0000 | INHALATION_SPRAY | Freq: Four times a day (QID) | RESPIRATORY_TRACT | 1 refills | Status: DC | PRN

## 2020-05-11 NOTE — Patient Instructions (Signed)
Vaccine Scheduling - Ages 31-11  Appointments are required. To register for your free vaccination appointment, click the link on the date on the calendar below or call [phone] (Mon-Fri 7 a.m. to 7 p.m).  Several [facility] pediatricians' offices offer the vaccine to existing patients by appointment only. Patients should call the practice to schedule their appointment.  [facility] - Phone: [place] - Phone: [facility] - Phone: [phone] [place] - Phone: [phone] [facility] - Phone: [phone] [facility] - Phone: [phone] What You Need to Know About Pediatric COVID-19 Vaccination:  The [place] COVID-19 vaccine for children ages 5-11 is a smaller dose. The vaccine is a two-dose series that is given three weeks apart. Parents/guardians must accompany the child to the appointment to provide consent. Read the FDA Fact Sheet for more detailed information about the vaccine. Vaccine Scheduling - Ages 24 and Above. Appointments are required. To register for your free vaccination appointment, click the link on the date on the calendar below or call [phone] (Mon-Fri 7 a.m. to 7 p.m).  Please wear a mask, plan to remain socially distanced and wear loose clothing. Pfizer clinics are for ages 20+. All other vaccine types are 18+. NOTICE: State law now requires written consent from a parent or legal guardian for any minor ages 19 to 72 prior to administrating any vaccine that that has been granted emergency use authorization (EUA). This includes the [place] vaccine since it remains under EUA for minors.  Booster Scheduling - Based on Eligibility Appointments are required. If eligible, you can register for your free vaccination booster appointment by clicking the link on the date on the calendar below or call [phone] (Mon-Fri 7 a.m. to 7 p.m.). You  must attest to the date you received your last dose of a COVID-19 vaccine to schedule your booster dose.   Booster Eligibility Ages 71+ - Eligible for any COVID-19 booster   Pfizer-BioNTech & Moderna, eligible 5 months after receiving your second shot. [place] (single dose), eligible 2 months after receiving your initial shot. Following FDA and CDC recommendations, we can "mix-and-match" booster doses at the clinics. Ages 37-17 - Eligible for the [place] Vaccine boosters Make an appointment for a Pfizer booster if you received the second dose of the Pfizer vaccine series at least 5 months ago. Ages 26-11 - Boosters only recommended for moderately or severely immunocompromised children Immunocompromised children are recommended to receive an additional primary dose of vaccine 28 days after their second shot. Only the Pfizer vaccine is authorized and recommended for this age group. Fully vaccinated children 5-11 who are not immunocompromised do not need a third dose.

## 2020-05-11 NOTE — Progress Notes (Signed)
BP 108/73   Pulse 81   Temp 98.7 F (37.1 C)   Wt 164 lb (74.4 kg)   SpO2 97%   BMI 28.56 kg/m    Subjective:    Patient ID: Karen Mess., female    DOB: 1971-03-23, 50 y.o.   MRN: [id number]  HPI: Karen Walker is a 50 y.o. female  Chief Complaint  Patient presents with  . Shortness of Breath    Patient here to follow up. Patient states she is having back pain where her lungs are located.    Karen Walker is feeling a lot better. She notes that she is not feeling as short of breath but that it has continued. She continues to have some SOB, tightness in her chest and some cough. She has remained quite tired. She has been having pain in her back in the middle of her back. No fevers. No chills. She is otherwise feeling well with no other concerns or complaints at this time.   Relevant past medical, surgical, family and social history reviewed and updated as indicated. Interim medical history since our last visit reviewed. Allergies and medications reviewed and updated.  Review of Systems  Constitutional: Negative.   HENT: Negative.   Respiratory: Positive for cough, chest tightness and shortness of breath. Negative for apnea, choking, wheezing and stridor.   Cardiovascular: Negative.   Gastrointestinal: Negative.   Musculoskeletal: Positive for back pain and myalgias. Negative for arthralgias, gait problem, joint swelling, neck pain and neck stiffness.  Skin: Negative.   Neurological: Negative.   Psychiatric/Behavioral: Negative.     Per HPI unless specifically indicated above     Objective:    BP 108/73   Pulse 81   Temp 98.7 F (37.1 C)   Wt 164 lb (74.4 kg)   SpO2 97%   BMI 28.56 kg/m   Wt Readings from Last 3 Encounters:  05/11/20 164 lb (74.4 kg)  03/16/20 162 lb (73.5 kg)  02/14/20 165 lb 3.2 oz (74.9 kg)    Physical Exam Vitals and nursing note reviewed.  Constitutional:      General: She is not in acute distress.    Appearance: Normal appearance. She is  not ill-appearing, toxic-appearing or diaphoretic.  HENT:     Head: Normocephalic and atraumatic.     Right Ear: External ear normal.     Left Ear: External ear normal.     Nose: Nose normal.     Mouth/Throat:     Mouth: Mucous membranes are moist.     Pharynx: Oropharynx is clear.  Eyes:     General: No scleral icterus.       Right eye: No discharge.        Left eye: No discharge.     Extraocular Movements: Extraocular movements intact.     Conjunctiva/sclera: Conjunctivae normal.     Pupils: Pupils are equal, round, and reactive to light.  Cardiovascular:     Rate and Rhythm: Normal rate and regular rhythm.     Pulses: Normal pulses.     Heart sounds: Normal heart sounds. No murmur heard. No friction rub. No gallop.   Pulmonary:     Effort: Pulmonary effort is normal. No respiratory distress.     Breath sounds: Normal breath sounds. No stridor. No wheezing, rhonchi or rales.  Chest:     Chest wall: No tenderness.  Musculoskeletal:        General: Normal range of motion.     Cervical back: Normal  range of motion and neck supple.  Skin:    General: Skin is warm and dry.     Capillary Refill: Capillary refill takes less than 2 seconds.     Coloration: Skin is not jaundiced or pale.     Findings: No bruising, erythema, lesion or rash.  Neurological:     General: No focal deficit present.     Mental Status: She is alert and oriented to person, place, and time. Mental status is at baseline.  Psychiatric:        Mood and Affect: Mood normal.        Behavior: Behavior normal.        Thought Content: Thought content normal.        Judgment: Judgment normal.     Results for orders placed or performed in visit on 03/16/20  WET PREP FOR [place], YEAST, CLUE   Specimen: Sterile Swab   Sterile Swab  Result Value Ref Range   Trichomonas Exam Negative Negative   Yeast Exam Negative Negative   Clue Cell Exam Negative Negative  Urinalysis, Routine w reflex microscopic  Result  Value Ref Range   Specific Gravity, UA 1.020 1.005 - 1.030   pH, UA 6.0 5.0 - 7.5   Color, UA Yellow Yellow   Appearance Ur Clear Clear   Leukocytes,UA Negative Negative   Protein,UA Negative Negative/Trace   Glucose, UA Negative Negative   Ketones, UA Negative Negative   RBC, UA Negative Negative   Bilirubin, UA Negative Negative   Urobilinogen, Ur 0.2 0.2 - 1.0 mg/dL   Nitrite, UA Negative Negative      Assessment & Plan:   Problem List Items Addressed This Visit   None   Visit Diagnoses    Pneumonia due to COVID-19 virus    -  Primary   Has completed her antibiotics, but she continues with SOB and back pain. We will repeat CXR and start albuterol. Call with any concerns. Await results.    Relevant Medications   albuterol (VENTOLIN HFA) 108 (90 Base) MCG/ACT inhaler   Other Relevant Orders   DG Chest 2 View       Follow up plan: Return in about 2 weeks (around 05/25/2020).

## 2020-05-13 ENCOUNTER — Encounter: Payer: Self-pay | Admitting: Family Medicine

## 2020-05-14 ENCOUNTER — Ambulatory Visit
Admission: RE | Admit: 2020-05-14 | Discharge: 2020-05-14 | Disposition: A | Discharge status: Home / Self Care | Payer: 59 | Attending: Family Medicine | Admitting: Family Medicine

## 2020-05-14 ENCOUNTER — Ambulatory Visit
Admission: RE | Admit: 2020-05-14 | Discharge: 2020-05-14 | Disposition: A | Discharge status: Home / Self Care | Payer: 59 | Source: Ambulatory Visit | Attending: Family Medicine | Admitting: Family Medicine

## 2020-05-14 DIAGNOSIS — U071 COVID-19: Secondary | ICD-10-CM | POA: Insufficient documentation

## 2020-05-14 DIAGNOSIS — J1282 Pneumonia due to coronavirus disease 2019: Secondary | ICD-10-CM

## 2020-05-14 DIAGNOSIS — J189 Pneumonia, unspecified organism: Secondary | ICD-10-CM | POA: Diagnosis not present

## 2020-06-08 ENCOUNTER — Other Ambulatory Visit: Payer: Self-pay | Admitting: Internal Medicine

## 2020-06-08 ENCOUNTER — Telehealth: Payer: Self-pay | Admitting: *Deleted

## 2020-06-08 ENCOUNTER — Ambulatory Visit: Payer: 59 | Attending: Internal Medicine

## 2020-06-08 DIAGNOSIS — Z23 Encounter for immunization: Secondary | ICD-10-CM

## 2020-06-08 NOTE — Telephone Encounter (Signed)
Pt came in and dropped off [employer] form to be filled out by Dr Wynetta Emery. She wants a call when it is ready so she can pick it up please [phone] Placed in [facility] for review

## 2020-06-08 NOTE — Progress Notes (Signed)
   [id number]  Name:  Karen Walker.    MRN: [id number] DOB: 08-08-1970  06/08/2020  [name] was observed post Covid-19 immunization for 15 minutes without incident. She was provided with Vaccine Information Sheet and instruction to access the V-Safe system.   [name] was instructed to call 911 with any severe reactions post vaccine: Marland Kitchen Difficulty breathing  . Swelling of face and throat  . A fast heartbeat  . A bad rash all over body  . Dizziness and weakness   Immunizations Administered    Name Date Dose VIS Date Route   PFIZER Comrnaty(Gray TOP) Covid-19 Vaccine 06/08/2020 10:45 AM 0.3 mL 03/15/2020 Intramuscular   Manufacturer: [employer]   Lot: [id number]   NDC: [phone]

## 2020-06-08 NOTE — Telephone Encounter (Signed)
In your folder for signature

## 2020-06-13 ENCOUNTER — Telehealth: Payer: Self-pay

## 2020-06-13 NOTE — Telephone Encounter (Signed)
Copied from [place] [id number]. Topic: General - Other >> Jun 13, 2020  4:00 PM Pawlus, Brayton Layman A wrote: Reason for CRM: Pt was calling to follow up on her FMLA paperwork, pt stated it is missing one page. Please advise.

## 2020-06-14 NOTE — Telephone Encounter (Signed)
Patient notified paperwork was ready for pick up.

## 2020-06-19 ENCOUNTER — Telehealth: Payer: Self-pay

## 2020-06-19 NOTE — Telephone Encounter (Signed)
Returned patients call. LVM to call office back. 

## 2020-07-02 ENCOUNTER — Other Ambulatory Visit: Payer: Self-pay

## 2020-07-03 ENCOUNTER — Other Ambulatory Visit: Payer: Self-pay

## 2020-07-03 ENCOUNTER — Encounter: Payer: Self-pay | Admitting: Gastroenterology

## 2020-07-03 ENCOUNTER — Ambulatory Visit: Payer: 59 | Admitting: Gastroenterology

## 2020-07-03 ENCOUNTER — Ambulatory Visit: Payer: 59 | Admitting: Family Medicine

## 2020-07-03 ENCOUNTER — Encounter: Payer: Self-pay | Admitting: Family Medicine

## 2020-07-03 VITALS — BP 107/66 | HR 79 | Temp 97.9°F | Ht 63.0 in | Wt 161.0 lb

## 2020-07-03 VITALS — BP 96/64 | HR 73 | Temp 97.6°F | Wt 162.0 lb

## 2020-07-03 DIAGNOSIS — R1319 Other dysphagia: Secondary | ICD-10-CM

## 2020-07-03 DIAGNOSIS — H538 Other visual disturbances: Secondary | ICD-10-CM

## 2020-07-03 DIAGNOSIS — J309 Allergic rhinitis, unspecified: Secondary | ICD-10-CM | POA: Diagnosis not present

## 2020-07-03 DIAGNOSIS — F331 Major depressive disorder, recurrent, moderate: Secondary | ICD-10-CM

## 2020-07-03 DIAGNOSIS — K5900 Constipation, unspecified: Secondary | ICD-10-CM | POA: Diagnosis not present

## 2020-07-03 DIAGNOSIS — Z1211 Encounter for screening for malignant neoplasm of colon: Secondary | ICD-10-CM

## 2020-07-03 MED ORDER — NA SULFATE-K SULFATE-MG SULF 17.5-3.13-1.6 GM/177ML PO SOLN: ORAL | 0 refills | Status: DC

## 2020-07-03 MED ORDER — MONTELUKAST SODIUM 10 MG PO TABS: 10.0000 mg | ORAL_TABLET | Freq: Every day | ORAL | 3 refills | Status: DC

## 2020-07-03 MED ORDER — CETIRIZINE HCL 10 MG PO TABS: 10.0000 mg | ORAL_TABLET | Freq: Every day | ORAL | 3 refills | Status: DC

## 2020-07-03 MED ORDER — TRIAMCINOLONE ACETONIDE 40 MG/ML IJ SUSP
40.0000 mg | Freq: Once | INTRAMUSCULAR | Status: AC
  Administered 2020-07-03: 40 mg via INTRAMUSCULAR

## 2020-07-03 NOTE — Progress Notes (Signed)
BP 96/64   Pulse 73   Temp 97.6 F (36.4 C) (Oral)   Wt 162 lb (73.5 kg)   SpO2 97%   BMI 28.70 kg/m    Subjective:    Patient ID: Karen Mess., female    DOB: 1970-09-17, 50 y.o.   MRN: [id number]  HPI: Karen Walker is a 50 y.o. female  Chief Complaint  Patient presents with  . Follow-up    6 month follow up  . Depression   DEPRESSION Mood status: better Satisfied with current treatment?: yes Symptom severity: mild  Duration of current treatment : not on anything right now Psychotherapy/counseling: no  Previous psychiatric medications: lexapro, sertraline Depressed mood: no Anxious mood: yes Anhedonia: no Significant weight loss or gain: no Insomnia: no  Fatigue: no Feelings of worthlessness or guilt: no Impaired concentration/indecisiveness: no Suicidal ideations: no Hopelessness: no Crying spells: no Depression screen [facility] 2/9 07/03/2020 03/16/2020 08/05/2019 01/14/2019 12/16/2018  Decreased Interest 0 0 3 2 1   Down, Depressed, Hopeless 0 0 3 2 1   PHQ - 2 Score 0 0 6 4 2   Altered sleeping 0 2 3 2 1   Tired, decreased energy 0 3 3 2 1   Change in appetite 0 0 3 3 1   Feeling bad or failure about yourself  0 2 2 2 1   Trouble concentrating 1 3 3 2 1   Moving slowly or fidgety/restless 0 0 1 2 2   Suicidal thoughts 0 0 0 2 1  PHQ-9 Score 1 10 21 19 10   Difficult doing work/chores Not difficult at all - Very difficult Somewhat difficult Not difficult at all  Some recent data might be hidden   ALLERGIES Duration: about 2 weeks Runny nose: yes "clear Nasal congestion: yes Nasal itching: yes Sneezing: yes Eye swelling, itching or discharge: yes Post nasal drip: yes Cough: no Sinus pressure: no  Ear pain: no  Ear pressure: no  Fever: no  Symptoms occur seasonally: yes Symptoms occur perenially: yes Satisfied with current treatment: no Allergist evaluation in past: yes Allergen injection immunotherapy: no Recurrent sinus infections: no ENT evaluation in  past: no Known environmental allergy: yes Indoor pets: no History of asthma: no Current allergy medications: none  Relevant past medical, surgical, family and social history reviewed and updated as indicated. Interim medical history since our last visit reviewed. Allergies and medications reviewed and updated.  Review of Systems  Constitutional: Negative.   Respiratory: Negative.   Cardiovascular: Negative.   Gastrointestinal: Negative.   Musculoskeletal: Negative.   Neurological: Negative.   Psychiatric/Behavioral: Negative.     Per HPI unless specifically indicated above     Objective:    BP 96/64   Pulse 73   Temp 97.6 F (36.4 C) (Oral)   Wt 162 lb (73.5 kg)   SpO2 97%   BMI 28.70 kg/m   Wt Readings from Last 3 Encounters:  07/03/20 162 lb (73.5 kg)  07/03/20 161 lb (73 kg)  05/11/20 164 lb (74.4 kg)    Physical Exam Vitals and nursing note reviewed.  Constitutional:      General: She is not in acute distress.    Appearance: Normal appearance. She is not ill-appearing, toxic-appearing or diaphoretic.  HENT:     Head: Normocephalic and atraumatic.     Right Ear: External ear normal.     Left Ear: External ear normal.     Nose: Nose normal.     Mouth/Throat:     Mouth: Mucous membranes are moist.  Pharynx: Oropharynx is clear.  Eyes:     General: No scleral icterus.       Right eye: No discharge.        Left eye: No discharge.     Extraocular Movements: Extraocular movements intact.     Conjunctiva/sclera: Conjunctivae normal.     Pupils: Pupils are equal, round, and reactive to light.  Cardiovascular:     Rate and Rhythm: Normal rate and regular rhythm.     Pulses: Normal pulses.     Heart sounds: Normal heart sounds. No murmur heard. No friction rub. No gallop.   Pulmonary:     Effort: Pulmonary effort is normal. No respiratory distress.     Breath sounds: Normal breath sounds. No stridor. No wheezing, rhonchi or rales.  Chest:     Chest wall:  No tenderness.  Musculoskeletal:        General: Normal range of motion.     Cervical back: Normal range of motion and neck supple.  Skin:    General: Skin is warm and dry.     Capillary Refill: Capillary refill takes less than 2 seconds.     Coloration: Skin is not jaundiced or pale.     Findings: No bruising, erythema, lesion or rash.  Neurological:     General: No focal deficit present.     Mental Status: She is alert and oriented to person, place, and time. Mental status is at baseline.  Psychiatric:        Mood and Affect: Mood normal.        Behavior: Behavior normal.        Thought Content: Thought content normal.        Judgment: Judgment normal.     Results for orders placed or performed in visit on 03/16/20  WET PREP FOR [place], YEAST, CLUE   Specimen: Sterile Swab   Sterile Swab  Result Value Ref Range   Trichomonas Exam Negative Negative   Yeast Exam Negative Negative   Clue Cell Exam Negative Negative  Urinalysis, Routine w reflex microscopic  Result Value Ref Range   Specific Gravity, UA 1.020 1.005 - 1.030   pH, UA 6.0 5.0 - 7.5   Color, UA Yellow Yellow   Appearance Ur Clear Clear   Leukocytes,UA Negative Negative   Protein,UA Negative Negative/Trace   Glucose, UA Negative Negative   Ketones, UA Negative Negative   RBC, UA Negative Negative   Bilirubin, UA Negative Negative   Urobilinogen, Ur 0.2 0.2 - 1.0 mg/dL   Nitrite, UA Negative Negative      Assessment & Plan:   Problem List Items Addressed This Visit      Respiratory   Allergic rhinitis - Primary    Will restart her zyrtec and start singulair. Call with any concerns. Continue to monitor. Call with any concerns.         Other   Major depression, recurrent ([place])    Doing well off medicine. Continue to monitor. Call with any concerns.       Other Visit Diagnoses    Blurred vision       Would like to get back into see otholmolgy. Referral generated today.   Relevant Orders    Ambulatory referral to Ophthalmology       Follow up plan: Return in about 6 months (around 01/03/2021) for in person.

## 2020-07-03 NOTE — Progress Notes (Signed)
suprep

## 2020-07-03 NOTE — Progress Notes (Signed)
Karen Walker [address]  [place], [place] [address]  Main: [phone]  Fax: [phone]   Gastroenterology Consultation  Referring Provider:     Valerie Roys, DO Primary Care Physician:  Valerie Roys, DO Reason for Consultation:     Abdominal cramping, dysphagia        HPI:    Chief Complaint  Patient presents with  . Bloated    Karen Holding V. is a 50 y.o. y/o female referred for consultation & management  by Dr. Wynetta Emery, Megan P, DO.  Patient reports chronic history of constipation and was previously prescribed suppositories as she was unable to have a bowel movement without it previously.  However, she states she has increased her fiber intake and eats vegetables apiece twice a day and with this she has not had to take any oral or rectal medications to help her have a bowel movement.  Is reporting 1 bowel movement daily that is soft.  However, does continue to report abdominal cramping that she states is from gas.  No nausea or vomiting.  However, is describing solid food dysphagia, states has symptoms with watermelon, or other fruits.  States this has been going on for years.  No prior EGD or colonoscopy.  No family history of colon cancer.  Denies any blood in stool.  Past Medical History:  Diagnosis Date  . Allergic rhinitis   . Anxiety   . Breast lump on right side at 8 o'clock position 12/06/2014  . Depression   . History of skin cancer   . Hx of migraine headaches    light sensivity, unilateral HA  . Urticaria     Past Surgical History:  Procedure Laterality Date  . ABDOMINAL HYSTERECTOMY  2014  . BREAST CYST ASPIRATION Right 2013   negative. Done at Dr. Dwyane Luo office  . HERNIA REPAIR  2014  . hysterectemy    . melonoma removal on R medial arch of foot  2008  . TOTAL ABDOMINAL HYSTERECTOMY W/ BILATERAL SALPINGOOPHORECTOMY    . UMBILICAL HERNIA REPAIR     occurred with hysterectemy    Prior to Admission medications    Medication Sig Start Date End Date Taking? Authorizing Provider  cetirizine (ZYRTEC) 10 MG tablet Take 10 mg by mouth daily.   Yes [provider]  Na Sulfate-K Sulfate-Mg Sulf 17.5-3.13-1.6 GM/177ML SOLN At 5 PM the day before procedure take 1 bottle and 5 hours before procedure take 1 bottle. 07/03/20  Yes Virgel Manifold, MD    Family History  Problem Relation Age of Onset  . Stroke Father   . Asthma Daughter   . Urticaria Son   . Food Allergy Son        red dye, dairy  . Allergic rhinitis Neg Hx   . Angioedema Neg Hx   . Eczema Neg Hx      Social History   Tobacco Use  . Smoking status: Never Smoker  . Smokeless tobacco: Never Used  Vaping Use  . Vaping Use: Never used  Substance Use Topics  . Alcohol use: No  . Drug use: No    Allergies as of 07/03/2020 - Review Complete 07/03/2020  Allergen Reaction Noted  . Codeine Other (See Comments) 08/11/2014  . Pollen extract  08/11/2014    Review of Systems:    All systems reviewed and negative except where noted in HPI.   Physical Exam:  BP 107/66   Pulse 79   Temp  97.9 F (36.6 C) (Oral)   Ht 5\' 3"  (1.6 m)   Wt 161 lb (73 kg)   BMI 28.52 kg/m  No LMP recorded. Patient has had a hysterectomy. Psych:  Alert and cooperative. Normal mood and affect. General:   Alert,  Well-developed, well-nourished, pleasant and cooperative in NAD Head:  Normocephalic and atraumatic. Eyes:  Sclera clear, no icterus.   Conjunctiva pink. Ears:  Normal auditory acuity. Nose:  No deformity, discharge, or lesions. Mouth:  No deformity or lesions,oropharynx pink & moist. Neck:  Supple; no masses or thyromegaly. Abdomen:  Normal bowel sounds.  No bruits.  Soft, non-tender and non-distended without masses, hepatosplenomegaly or hernias noted.  No guarding or rebound tenderness.    Msk:  Symmetrical without gross deformities. Good, equal movement & strength bilaterally. Pulses:  Normal pulses noted. Extremities:  No clubbing  or edema.  No cyanosis. Neurologic:  Alert and oriented x3;  grossly normal neurologically. Skin:  Intact without significant lesions or rashes. No jaundice. Lymph Nodes:  No significant cervical adenopathy. Psych:  Alert and cooperative. Normal mood and affect.   Labs: CBC    Component Value Date/Time   WBC 5.0 12/26/2019 1447   WBC 9.2 11/01/2015 1450   RBC 4.07 12/26/2019 1447   RBC 4.43 11/01/2015 1450   HGB 11.9 12/26/2019 1447   HCT 36.1 12/26/2019 1447   PLT 177 12/26/2019 1447   MCV 89 12/26/2019 1447   MCV 91 08/22/2013 1432   MCH 29.2 12/26/2019 1447   MCH 29.7 11/01/2015 1450   MCHC 33.0 12/26/2019 1447   MCHC 33.9 11/01/2015 1450   RDW 13.0 12/26/2019 1447   RDW 12.7 08/22/2013 1432   LYMPHSABS 1.7 12/26/2019 1447   LYMPHSABS 2.0 08/04/2011 1714   MONOABS 0.3 08/04/2011 1714   EOSABS 0.2 12/26/2019 1447   EOSABS 0.3 08/04/2011 1714   BASOSABS 0.0 12/26/2019 1447   BASOSABS 0.0 08/04/2011 1714   CMP     Component Value Date/Time   NA 141 12/26/2019 1447   NA 138 08/22/2013 1432   K 4.0 12/26/2019 1447   K 3.3 (L) 08/22/2013 1432   CL 104 12/26/2019 1447   CL 106 08/22/2013 1432   CO2 24 12/26/2019 1447   CO2 24 08/22/2013 1432   GLUCOSE 84 12/26/2019 1447   GLUCOSE 99 11/01/2015 1450   GLUCOSE 97 08/22/2013 1432   BUN 19 12/26/2019 1447   BUN 6 (L) 08/22/2013 1432   CREATININE 0.76 12/26/2019 1447   CREATININE 0.62 08/22/2013 1432   CALCIUM 9.2 12/26/2019 1447   CALCIUM 9.5 08/22/2013 1432   PROT 6.8 12/26/2019 1447   PROT 8.8 (H) 08/22/2013 1432   ALBUMIN 4.0 12/26/2019 1447   ALBUMIN 4.3 08/22/2013 1432   AST 20 12/26/2019 1447   AST 25 08/22/2013 1432   ALT 27 12/26/2019 1447   ALT 64 08/22/2013 1432   ALKPHOS 66 12/26/2019 1447   ALKPHOS 52 08/22/2013 1432   BILITOT 0.4 12/26/2019 1447   BILITOT 0.7 08/22/2013 1432   GFRNONAA 92 12/26/2019 1447   GFRNONAA >60 08/22/2013 1432   GFRAA 107 12/26/2019 1447   GFRAA >60 08/22/2013 1432     Imaging Studies: No results found.  Assessment and Plan:   Karen Pellicano. is a 50 y.o. y/o female has been referred for abdominal cramping, dysphagia  Patient does have underlying constipation that she has treated with a high-fiber diet over the last month with no further needs for oral laxatives or suppositories that  she was needing previously.  I have advised her to start taking fiber supplement over-the-counter with goal of 1-2 soft bowel movements a day.  This could help with her baseline constipation even further and help with abdominal cramping.  However, if this is not the case, we can start her on a bowel regimen, such as Metamucil or MiraLAX on a regular basis  She is due for screening colonoscopy as well  In addition, she is reporting dysphagia to solid foods, but with no history of food impaction.  Proceed with EGD along with her colonoscopy as well  I have discussed alternative options, risks & benefits,  which include, but are not limited to, bleeding, infection, perforation,respiratory complication & drug reaction.  The patient agrees with this plan & written consent will be obtained.    Alternative options of conservative management were discussed in detail, including but not limited to medication management, foregoing endoscopic procedures at this time and others.      Dr Karen Walker  Speech recognition software was used to dictate the above note.

## 2020-07-04 ENCOUNTER — Encounter: Payer: Self-pay | Admitting: Family Medicine

## 2020-07-04 NOTE — Assessment & Plan Note (Signed)
Doing well off medicine. Continue to monitor. Call with any concerns.  

## 2020-07-04 NOTE — Assessment & Plan Note (Signed)
Will restart her zyrtec and start singulair. Call with any concerns. Continue to monitor. Call with any concerns.

## 2020-07-10 ENCOUNTER — Other Ambulatory Visit: Payer: Self-pay

## 2020-07-20 ENCOUNTER — Ambulatory Visit
Admission: RE | Admit: 2020-07-20 | Discharge: 2020-07-20 | Disposition: A | Discharge status: Home / Self Care | Payer: 59 | Attending: Gastroenterology | Admitting: Gastroenterology

## 2020-07-20 ENCOUNTER — Ambulatory Visit: Payer: 59 | Admitting: Anesthesiology

## 2020-07-20 ENCOUNTER — Encounter
Admission: RE | Disposition: A | Discharge status: Home / Self Care | Payer: Self-pay | Source: Home / Self Care | Attending: Gastroenterology

## 2020-07-20 ENCOUNTER — Other Ambulatory Visit: Payer: Self-pay

## 2020-07-20 DIAGNOSIS — K644 Residual hemorrhoidal skin tags: Secondary | ICD-10-CM | POA: Insufficient documentation

## 2020-07-20 DIAGNOSIS — K222 Esophageal obstruction: Secondary | ICD-10-CM

## 2020-07-20 DIAGNOSIS — Z1211 Encounter for screening for malignant neoplasm of colon: Secondary | ICD-10-CM

## 2020-07-20 DIAGNOSIS — Z85828 Personal history of other malignant neoplasm of skin: Secondary | ICD-10-CM | POA: Diagnosis not present

## 2020-07-20 DIAGNOSIS — K449 Diaphragmatic hernia without obstruction or gangrene: Secondary | ICD-10-CM | POA: Diagnosis not present

## 2020-07-20 DIAGNOSIS — R1310 Dysphagia, unspecified: Secondary | ICD-10-CM | POA: Insufficient documentation

## 2020-07-20 DIAGNOSIS — K253 Acute gastric ulcer without hemorrhage or perforation: Secondary | ICD-10-CM

## 2020-07-20 DIAGNOSIS — B9681 Helicobacter pylori [H. pylori] as the cause of diseases classified elsewhere: Secondary | ICD-10-CM | POA: Diagnosis not present

## 2020-07-20 DIAGNOSIS — K3189 Other diseases of stomach and duodenum: Secondary | ICD-10-CM | POA: Insufficient documentation

## 2020-07-20 DIAGNOSIS — K2289 Other specified disease of esophagus: Secondary | ICD-10-CM | POA: Diagnosis not present

## 2020-07-20 DIAGNOSIS — R1319 Other dysphagia: Secondary | ICD-10-CM | POA: Diagnosis not present

## 2020-07-20 DIAGNOSIS — Z885 Allergy status to narcotic agent status: Secondary | ICD-10-CM | POA: Diagnosis not present

## 2020-07-20 DIAGNOSIS — K2270 Barrett's esophagus without dysplasia: Secondary | ICD-10-CM | POA: Diagnosis not present

## 2020-07-20 DIAGNOSIS — K2950 Unspecified chronic gastritis without bleeding: Secondary | ICD-10-CM | POA: Insufficient documentation

## 2020-07-20 DIAGNOSIS — Z79899 Other long term (current) drug therapy: Secondary | ICD-10-CM | POA: Insufficient documentation

## 2020-07-20 DIAGNOSIS — K259 Gastric ulcer, unspecified as acute or chronic, without hemorrhage or perforation: Secondary | ICD-10-CM | POA: Diagnosis not present

## 2020-07-20 HISTORY — PX: COLONOSCOPY WITH PROPOFOL: SHX5780

## 2020-07-20 HISTORY — PX: ESOPHAGOGASTRODUODENOSCOPY (EGD) WITH PROPOFOL: SHX5813

## 2020-07-20 SURGERY — COLONOSCOPY WITH PROPOFOL
Anesthesia: General

## 2020-07-20 MED ORDER — FENTANYL CITRATE (PF) 100 MCG/2ML IJ SOLN
INTRAMUSCULAR | Status: AC
  Filled 2020-07-20: qty 2

## 2020-07-20 MED ORDER — MIDAZOLAM HCL 2 MG/2ML IJ SOLN
INTRAMUSCULAR | Status: AC
  Filled 2020-07-20: qty 2

## 2020-07-20 MED ORDER — MIDAZOLAM HCL 2 MG/2ML IJ SOLN
INTRAMUSCULAR | Status: DC | PRN
  Administered 2020-07-20: 2 mg via INTRAVENOUS

## 2020-07-20 MED ORDER — LIDOCAINE HCL (CARDIAC) PF 100 MG/5ML IV SOSY
PREFILLED_SYRINGE | INTRAVENOUS | Status: DC | PRN
  Administered 2020-07-20: 60 mg via INTRAVENOUS

## 2020-07-20 MED ORDER — LIDOCAINE HCL (PF) 2 % IJ SOLN
INTRAMUSCULAR | Status: AC
  Filled 2020-07-20: qty 5

## 2020-07-20 MED ORDER — OMEPRAZOLE 20 MG PO CPDR
DELAYED_RELEASE_CAPSULE | ORAL | 0 refills | Status: DC
  Filled 2020-07-20: qty 120, 90d supply, fill #0

## 2020-07-20 MED ORDER — SODIUM CHLORIDE 0.9 % IV SOLN
INTRAVENOUS | Status: DC
  Administered 2020-07-20: 20 mL/h via INTRAVENOUS

## 2020-07-20 MED ORDER — PROPOFOL 500 MG/50ML IV EMUL
INTRAVENOUS | Status: DC | PRN
  Administered 2020-07-20: 75 ug/kg/min via INTRAVENOUS

## 2020-07-20 MED ORDER — PROPOFOL 10 MG/ML IV BOLUS
INTRAVENOUS | Status: DC | PRN
  Administered 2020-07-20 (×2): 20 mg via INTRAVENOUS
  Administered 2020-07-20: 30 mg via INTRAVENOUS

## 2020-07-20 MED ORDER — FENTANYL CITRATE (PF) 100 MCG/2ML IJ SOLN
INTRAMUSCULAR | Status: DC | PRN
  Administered 2020-07-20 (×2): 50 ug via INTRAVENOUS

## 2020-07-20 MED ORDER — PHENYLEPHRINE HCL (PRESSORS) 10 MG/ML IV SOLN
INTRAVENOUS | Status: DC | PRN
  Administered 2020-07-20 (×3): 100 ug via INTRAVENOUS

## 2020-07-20 NOTE — Transfer of Care (Signed)
Immediate Anesthesia Transfer of Care Note  Patient: Karen Walker  Procedure(s) Performed: COLONOSCOPY WITH PROPOFOL (N/A ) ESOPHAGOGASTRODUODENOSCOPY (EGD) WITH PROPOFOL (N/A )  Patient Location: PACU  Anesthesia Type:General  Level of Consciousness: sedated  Airway & Oxygen Therapy: Patient Spontanous Breathing and Patient connected to nasal cannula oxygen  Post-op Assessment: Report given to RN and Post -op Vital signs reviewed and stable  Post vital signs: Reviewed and stable  Last Vitals:  Vitals Value Taken Time  BP 100/62 07/20/20 1058  Temp    Pulse 80 07/20/20 1059  Resp 15 07/20/20 1059  SpO2 100 % 07/20/20 1059  Vitals shown include unvalidated device data.  Last Pain:  Vitals:   07/20/20 0920  TempSrc: Temporal  PainSc: 0-No pain         Complications: No complications documented.

## 2020-07-20 NOTE — Op Note (Signed)
[facility] Gastroenterology Patient Name: Karen Walker Procedure Date: 07/20/2020 9:55 AM MRN: [id number] Account #: [id number] Date of Birth: Sep 15, 1970 Admit Type: Outpatient Age: 50 Room: [facility] ENDO ROOM 2 Gender: Female Note Status: Finalized Procedure:             Colonoscopy Indications:           Screening for colorectal malignant neoplasm Providers:             Jessie Schrieber B. Bonna Gains MD, MD Referring MD:          Valerie Roys (Referring MD) Medicines:             Monitored Anesthesia Care Complications:         No immediate complications. Procedure:             Pre-Anesthesia Assessment:                        - Prior to the procedure, a History and Physical was                         performed, and patient medications, allergies and                         sensitivities were reviewed. The patient's tolerance                         of previous anesthesia was reviewed.                        - The risks and benefits of the procedure and the                         sedation options and risks were discussed with the                         patient. All questions were answered and informed                         consent was obtained.                        - Patient identification and proposed procedure were                         verified prior to the procedure by the physician, the                         nurse, the anesthetist and the technician. The                         procedure was verified in the pre-procedure area in                         the procedure room in the endoscopy suite.                        - ASA Grade Assessment: II - A patient with mild  systemic disease.                        - After reviewing the risks and benefits, the patient                         was deemed in satisfactory condition to undergo the                         procedure.                        After obtaining informed consent, the  colonoscope was                         passed under direct vision. Throughout the procedure,                         the patient's blood pressure, pulse, and oxygen                         saturations were monitored continuously. The                         Colonoscope was introduced through the anus and                         advanced to the the cecum, identified by appendiceal                         orifice and ileocecal valve. The colonoscopy was                         performed with ease. The patient tolerated the                         procedure well. The quality of the bowel preparation                         was good. Findings:      Hemorrhoids were found on perianal exam.      The rectum, sigmoid colon, descending colon, transverse colon, ascending       colon and cecum appeared normal.      The retroflexed view of the distal rectum and anal verge was normal and       showed no anal or rectal abnormalities. Impression:            - Hemorrhoids found on perianal exam.                        - The rectum, sigmoid colon, descending colon,                         transverse colon, ascending colon and cecum are normal.                        - The distal rectum and anal verge are normal on  retroflexion view.                        - No specimens collected. Recommendation:        - Discharge patient to home.                        - Resume previous diet.                        - Continue present medications.                        - Repeat colonoscopy in 10 years for screening                         purposes.                        - Return to primary care physician as previously                         scheduled.                        - The findings and recommendations were discussed with                         the patient.                        - The findings and recommendations were discussed with                         the patient's family.                         - In the future, if patient develops new symptoms such                         as blood per rectum, abdominal pain, weight loss,                         altered bowel habits or any other reason for concern,                         patient should discuss this with thier PCP as they may                         need a GI referral at that time or evaluation for need                         for colonoscopy earlier than the recommended screening                         colonoscopy.                        In addition, if patient's family history of colon  cancer changes (no family history at this time) in the                         future, earlier screening may be indicated and patient                         should discuss this with PCP as well. Procedure Code(s):     --- Professional ---                        [id number], Colonoscopy, flexible; diagnostic, including                         collection of specimen(s) by brushing or washing, when                         performed (separate procedure) Diagnosis Code(s):     --- Professional ---                        Z12.11, Encounter for screening for malignant neoplasm                         of colon CPT copyright 2019 American Medical Association. All rights reserved. The codes documented in this report are preliminary and upon coder review may  be revised to meet current compliance requirements.  Vonda Antigua, MD Margretta Sidle B. Bonna Gains MD, MD 07/20/2020 10:59:37 AM This report has been signed electronically. Number of Addenda: 0 Note Initiated On: 07/20/2020 9:55 AM Scope Withdrawal Time: 0 hours 12 minutes 42 seconds  Total Procedure Duration: 0 hours 19 minutes 55 seconds       [facility]

## 2020-07-20 NOTE — Anesthesia Postprocedure Evaluation (Signed)
Anesthesia Post Note  Patient: Blase Mess  Procedure(s) Performed: COLONOSCOPY WITH PROPOFOL (N/A ) ESOPHAGOGASTRODUODENOSCOPY (EGD) WITH PROPOFOL (N/A )  Patient location during evaluation: PACU Anesthesia Type: General Level of consciousness: awake and alert Pain management: pain level controlled Vital Signs Assessment: post-procedure vital signs reviewed and stable Respiratory status: spontaneous breathing, nonlabored ventilation and respiratory function stable Cardiovascular status: blood pressure returned to baseline and stable Postop Assessment: no apparent nausea or vomiting Anesthetic complications: no   No complications documented.   Last Vitals:  Vitals:   07/20/20 1128 07/20/20 1138  BP: 128/76 117/70  Pulse: 77 75  Resp: 15 15  Temp:  (!) 36.2 C  SpO2: 100% 100%    Last Pain:  Vitals:   07/20/20 1138  TempSrc:   PainSc: 0-No pain                 Brett Canales Mable Lashley

## 2020-07-20 NOTE — Op Note (Signed)
[facility] Gastroenterology Patient Name: Karen Walker Procedure Date: 07/20/2020 9:56 AM MRN: [id number] Account #: [id number] Date of Birth: [birth year] Admit Type: Outpatient Age: 50 Room: [facility] ENDO ROOM 2 Gender: Female Note Status: Finalized Procedure:             Upper GI endoscopy Indications:           Dysphagia Providers:             Katalyn Matin B. Bonna Gains MD, MD Referring MD:          Valerie Roys (Referring MD) Medicines:             Monitored Anesthesia Care Complications:         No immediate complications. Procedure:             Pre-Anesthesia Assessment:                        - Prior to the procedure, a History and Physical was                         performed, and patient medications, allergies and                         sensitivities were reviewed. The patient's tolerance                         of previous anesthesia was reviewed.                        - The risks and benefits of the procedure and the                         sedation options and risks were discussed with the                         patient. All questions were answered and informed                         consent was obtained.                        - Patient identification and proposed procedure were                         verified prior to the procedure by the physician, the                         nurse, the anesthesiologist, the anesthetist and the                         technician. The procedure was verified in the                         procedure room.                        - ASA Grade Assessment: II - A patient with mild                         systemic disease.  After obtaining informed consent, the endoscope was                         passed under direct vision. Throughout the procedure,                         the patient's blood pressure, pulse, and oxygen                         saturations were monitored continuously. The Endoscope                          was introduced through the mouth, and advanced to the                         second part of duodenum. The upper GI endoscopy was                         accomplished with ease. The patient tolerated the                         procedure well. Findings:      A low-grade of narrowing Schatzki ring was found in the distal       esophagus. A TTS dilator was passed through the scope. Dilation with an       18-19-20 mm balloon dilator was performed to 20 mm. The dilation site       was examined and showed complete resolution of luminal narrowing.      Tongues of salmon-colored mucosa were present. No other visible       abnormalities were present. The maximum longitudinal extent of these       esophageal mucosal changes was 1 cm in length. Mucosa was biopsied with       a cold forceps for histology in a targeted manner and in 4 quadrants.       The biopsies were done away from the area of dilation and did not       interfere with the noted area of appropriate and expected heme effect       seen post dilation. Biopsies were obtained from the proximal and distal       esophagus with cold forceps for histology of suspected eosinophilic       esophagitis.      The exam of the esophagus was otherwise normal.      Patchy mildly erythematous mucosa without bleeding was found in the       gastric fundus and in the gastric antrum. Biopsies were taken with a       cold forceps for histology. Biopsies were obtained in the gastric body,       at the incisura and in the gastric antrum with cold forceps for       histology.      A few erosions with no bleeding and no stigmata of recent bleeding were       found in the gastric antrum. Biopsies were taken with a cold forceps for       histology.      A small hiatal hernia was present.      The exam of the stomach was otherwise normal.      Mucosal flattening was found in the second  portion of the duodenum.       Biopsies were taken  with a cold forceps for histology.      The exam of the duodenum was otherwise normal.      The duodenal bulb, second portion of the duodenum and examined duodenum       were normal. Impression:            - Low-grade of narrowing Schatzki ring. Dilated.                        - Salmon-colored mucosa suspicious for short-segment                         Barrett's esophagus. Biopsied.                        - Erythematous mucosa in the gastric fundus and                         antrum. Biopsied.                        - Erosive gastropathy with no bleeding and no stigmata                         of recent bleeding. Biopsied.                        - Small hiatal hernia.                        - Flattened mucosa was found in the duodenum. Biopsied.                        - Normal duodenal bulb, second portion of the duodenum                         and examined duodenum.                        - Biopsies were obtained in the gastric body, at the                         incisura and in the gastric antrum. Recommendation:        - Await pathology results.                        - Discharge patient to home (with escort).                        - Advance diet as tolerated.                        - Continue present medications.                        - Patient has a contact number available for                         emergencies. The signs and symptoms of potential  delayed complications were discussed with the patient.                         Return to normal activities tomorrow. Written                         discharge instructions were provided to the patient.                        - Discharge patient to home (with escort).                        - The findings and recommendations were discussed with                         the patient.                        - The findings and recommendations were discussed with                         the patient's family.                         - Take prescribed proton pump inhibitor or H2 blocker                         (antacid) medications 30 - 60 minutes before meals.                        - Follow an antireflux regimen. Procedure Code(s):     --- Professional ---                        [phone], Esophagogastroduodenoscopy, flexible,                         transoral; with transendoscopic balloon dilation of                         esophagus (less than 30 mm diameter)                        43239, 59, Esophagogastroduodenoscopy, flexible,                         transoral; with biopsy, single or multiple Diagnosis Code(s):     --- Professional ---                        K22.2, Esophageal obstruction                        K22.8, Other specified diseases of esophagus                        K31.89, Other diseases of stomach and duodenum                        R13.10, Dysphagia, unspecified CPT copyright 2019 American Medical Association. All rights reserved. The codes documented in this report are preliminary and upon coder review may  be revised to meet current compliance requirements.  Vonda Antigua, MD Margretta Sidle B. Bonna Gains MD, MD 07/20/2020 10:33:20 AM This report has been signed electronically. Number of Addenda: 0 Note Initiated On: 07/20/2020 9:56 AM Estimated Blood Loss:  Estimated blood loss: none.      [facility]

## 2020-07-20 NOTE — Anesthesia Preprocedure Evaluation (Signed)
Anesthesia Evaluation  Patient identified by MRN, date of birth, ID band Patient awake    Reviewed: Allergy & Precautions, H&P , NPO status , Patient's Chart, lab work & pertinent test results  History of Anesthesia Complications Negative for: history of anesthetic complications  Airway Mallampati: II  TM Distance: >3 FB     Dental  (+) Teeth Intact   Pulmonary neg pulmonary ROS, neg sleep apnea, neg COPD, Not current smoker,    breath sounds clear to auscultation       Cardiovascular (-) angina(-) Past MI and (-) Cardiac Stents negative cardio ROS  (-) dysrhythmias  Rhythm:regular Rate:Normal     Neuro/Psych PSYCHIATRIC DISORDERS Anxiety Depression negative neurological ROS     GI/Hepatic negative GI ROS, Neg liver ROS,   Endo/Other  negative endocrine ROS  Renal/GU negative Renal ROS  negative genitourinary   Musculoskeletal   Abdominal   Peds  Hematology negative hematology ROS (+)   Anesthesia Other Findings Past Medical History: No date: Allergic rhinitis No date: Anxiety 12/06/2014: Breast lump on right side at 8 o'clock position No date: Depression No date: History of skin cancer No date: Hx of migraine headaches     Comment:  light sensivity, unilateral HA No date: Urticaria  Past Surgical History: 2014: ABDOMINAL HYSTERECTOMY 2013: BREAST CYST ASPIRATION; Right     Comment:  negative. Done at Dr. Dwyane Luo office 2014: HERNIA REPAIR No date: hysterectemy 2008: melonoma removal on R medial arch of foot No date: TOTAL ABDOMINAL HYSTERECTOMY W/ BILATERAL SALPINGOOPHORECTOMY No date: UMBILICAL HERNIA REPAIR     Comment:  occurred with hysterectemy  BMI    Body Mass Index: 27.46 kg/m      Reproductive/Obstetrics negative OB ROS                             Anesthesia Physical Anesthesia Plan  ASA: I  Anesthesia Plan: General   Post-op Pain Management:     Induction:   PONV Risk Score and Plan: Propofol infusion and TIVA  Airway Management Planned: Nasal Cannula  Additional Equipment:   Intra-op Plan:   Post-operative Plan:   Informed Consent: I have reviewed the patients History and Physical, chart, labs and discussed the procedure including the risks, benefits and alternatives for the proposed anesthesia with the patient or authorized representative who has indicated his/her understanding and acceptance.     Dental Advisory Given  Plan Discussed with: Anesthesiologist, CRNA and Surgeon  Anesthesia Plan Comments:         Anesthesia Quick Evaluation

## 2020-07-20 NOTE — H&P (Signed)
Karen Antigua, MD [address] [place], [place], [place], [address] [address], [place], [place], [place], [address] Phone: [phone]  Fax: [phone]  Primary Care Physician:  Valerie Roys, DO   Pre-Procedure History & Physical: HPI:  Karen Kovacik. is a 50 y.o. female is here for a colonoscopy and EGD.   Past Medical History:  Diagnosis Date  . Allergic rhinitis   . Anxiety   . Breast lump on right side at 8 o'clock position 12/06/2014  . Depression   . History of skin cancer   . Hx of migraine headaches    light sensivity, unilateral HA  . Urticaria     Past Surgical History:  Procedure Laterality Date  . ABDOMINAL HYSTERECTOMY  2014  . BREAST CYST ASPIRATION Right 2013   negative. Done at Dr. Dwyane Luo office  . HERNIA REPAIR  2014  . hysterectemy    . melonoma removal on R medial arch of foot  2008  . TOTAL ABDOMINAL HYSTERECTOMY W/ BILATERAL SALPINGOOPHORECTOMY    . UMBILICAL HERNIA REPAIR     occurred with hysterectemy    Prior to Admission medications   Medication Sig Start Date End Date Taking? Authorizing Provider  cetirizine (ZYRTEC) 10 MG tablet TAKE 1 TABLET (10 MG TOTAL) BY MOUTH DAILY. 07/03/20 07/03/21 Yes Johnson, Megan P, DO  COVID-19 mRNA Vac-TriS, Pfizer, SUSP injection USE AS DIRECTED 06/08/20 06/08/21 Yes Carlyle Basques, MD  montelukast (SINGULAIR) 10 MG tablet TAKE 1 TABLET (10 MG TOTAL) BY MOUTH AT BEDTIME. 07/03/20 07/03/21 Yes Johnson, Megan P, DO  Na Sulfate-K Sulfate-Mg Sulf 17.5-3.13-1.6 GM/177ML SOLN AT 5 PM THE DAY BEFORE PROCEDURE TAKE 1 BOTTLE AND 5 HOURS BEFORE PROCEDURE TAKE 1 BOTTLE. 07/03/20 07/03/21 Yes Aleksander Edmiston, Lennette Bihari, MD  albuterol (VENTOLIN HFA) 108 (90 Base) MCG/ACT inhaler Inhale 2 puffs into the lungs every 6 (six) hours as needed for wheezing or shortness of breath. Patient not taking: Reported on 07/03/2020 05/11/20 07/03/20  Park Liter P, DO    Allergies as of 07/03/2020 - Review Complete 07/03/2020   Allergen Reaction Noted  . Codeine Other (See Comments) 08/11/2014  . Pollen extract  08/11/2014    Family History  Problem Relation Age of Onset  . Stroke Father   . Asthma Daughter   . Urticaria Son   . Food Allergy Son        red dye, dairy  . Allergic rhinitis Neg Hx   . Angioedema Neg Hx   . Eczema Neg Hx     Social History   Socioeconomic History  . Marital status: Married    Spouse name: Not on file  . Number of children: Not on file  . Years of education: Not on file  . Highest education level: Not on file  Occupational History  . Not on file  Tobacco Use  . Smoking status: Never Smoker  . Smokeless tobacco: Never Used  Vaping Use  . Vaping Use: Never used  Substance and Sexual Activity  . Alcohol use: No  . Drug use: No  . Sexual activity: Yes    Birth control/protection: Surgical  Other Topics Concern  . Not on file  Social History Narrative  . Not on file   Social Determinants of Health   Financial Resource Strain: Not on file  Food Insecurity: Not on file  Transportation Needs: Not on file  Physical Activity: Not on file  Stress: Not on file  Social Connections: Not on file  Intimate Partner Violence:  Not on file    Review of Systems: See HPI, otherwise negative ROS  Physical Exam: BP 108/73   Pulse 83   Temp (!) 97.4 F (36.3 C) (Temporal)   Resp 20   Ht 5\' 4"  (1.626 m)   Wt 72.6 kg   SpO2 98%   BMI 27.46 kg/m  General:   Alert,  pleasant and cooperative in NAD Head:  Normocephalic and atraumatic. Neck:  Supple; no masses or thyromegaly. Lungs:  Clear throughout to auscultation, normal respiratory effort.    Heart:  +S1, +S2, Regular rate and rhythm, No edema. Abdomen:  Soft, nontender and nondistended. Normal bowel sounds, without guarding, and without rebound.   Neurologic:  Alert and  oriented x4;  grossly normal neurologically.  Impression/Plan: Karen Holding V. is here for a colonoscopy to be performed for average risk  screening and EGD for dysphagia  Risks, benefits, limitations, and alternatives regarding the procedures have been reviewed with the patient.  Questions have been answered.  All parties agreeable.   Virgel Manifold, MD  07/20/2020, 9:48 AM

## 2020-07-23 ENCOUNTER — Other Ambulatory Visit: Payer: Self-pay | Admitting: Gastroenterology

## 2020-07-23 ENCOUNTER — Other Ambulatory Visit: Payer: Self-pay

## 2020-07-23 ENCOUNTER — Encounter: Payer: Self-pay | Admitting: Gastroenterology

## 2020-07-23 LAB — SURGICAL PATHOLOGY

## 2020-07-23 MED ORDER — CLARITHROMYCIN 500 MG PO TABS
500.0000 mg | ORAL_TABLET | Freq: Two times a day (BID) | ORAL | 0 refills | Status: AC
  Filled 2020-07-23: qty 28, 14d supply, fill #0

## 2020-07-23 MED ORDER — AMOXICILLIN 500 MG PO CAPS
1000.0000 mg | ORAL_CAPSULE | Freq: Two times a day (BID) | ORAL | 0 refills | Status: AC
  Filled 2020-07-23: qty 56, 14d supply, fill #0

## 2020-07-23 MED ORDER — OMEPRAZOLE 20 MG PO CPDR
20.0000 mg | DELAYED_RELEASE_CAPSULE | Freq: Two times a day (BID) | ORAL | 0 refills | Status: DC
  Filled 2020-07-23: qty 28, 14d supply, fill #0

## 2020-08-01 ENCOUNTER — Encounter: Payer: Self-pay | Admitting: Dermatology

## 2020-08-01 ENCOUNTER — Other Ambulatory Visit: Payer: Self-pay

## 2020-08-01 ENCOUNTER — Ambulatory Visit: Payer: 59 | Admitting: Dermatology

## 2020-08-01 DIAGNOSIS — D1800 Hemangioma unspecified site: Secondary | ICD-10-CM

## 2020-08-01 DIAGNOSIS — D229 Melanocytic nevi, unspecified: Secondary | ICD-10-CM

## 2020-08-01 DIAGNOSIS — Z86018 Personal history of other benign neoplasm: Secondary | ICD-10-CM | POA: Diagnosis not present

## 2020-08-01 DIAGNOSIS — L820 Inflamed seborrheic keratosis: Secondary | ICD-10-CM | POA: Diagnosis not present

## 2020-08-01 DIAGNOSIS — L814 Other melanin hyperpigmentation: Secondary | ICD-10-CM | POA: Diagnosis not present

## 2020-08-01 DIAGNOSIS — L578 Other skin changes due to chronic exposure to nonionizing radiation: Secondary | ICD-10-CM | POA: Diagnosis not present

## 2020-08-01 DIAGNOSIS — I8393 Asymptomatic varicose veins of bilateral lower extremities: Secondary | ICD-10-CM | POA: Diagnosis not present

## 2020-08-01 DIAGNOSIS — Z1283 Encounter for screening for malignant neoplasm of skin: Secondary | ICD-10-CM | POA: Diagnosis not present

## 2020-08-01 DIAGNOSIS — L821 Other seborrheic keratosis: Secondary | ICD-10-CM

## 2020-08-01 NOTE — Patient Instructions (Signed)

## 2020-08-01 NOTE — Progress Notes (Signed)
New Patient Visit  Subjective  Karen Beckstrom V. is a 50 y.o. female who presents for the following: Annual Exam. Hx of moderately to severely dysplastic nevus. Patient has an irregular skin lesion on her L breast that she scratches off with her finger nails. The patient presents for Total-Body Skin Exam (TBSE) for skin cancer screening and mole check.  The following portions of the chart were reviewed this encounter and updated as appropriate:   Tobacco  Allergies  Meds  Problems  Med Hx  Surg Hx  Fam Hx     Review of Systems:  No other skin or systemic complaints except as noted in HPI or Assessment and Plan.  Objective  Well appearing patient in no apparent distress; mood and affect are within normal limits.  A full examination was performed including scalp, head, eyes, ears, nose, lips, neck, chest, axillae, abdomen, back, buttocks, bilateral upper extremities, bilateral lower extremities, hands, feet, fingers, toes, fingernails, and toenails. All findings within normal limits unless otherwise noted below.  Objective  L chest x 1: Erythematous keratotic or waxy stuck-on papule or plaque.   Assessment & Plan  Inflamed seborrheic keratosis L chest x 1  Destruction of lesion - L chest x 1 Complexity: simple   Destruction method: cryotherapy   Informed consent: discussed and consent obtained   Timeout:  patient name, date of birth, surgical site, and procedure verified Lesion destroyed using liquid nitrogen: Yes   Region frozen until ice ball extended beyond lesion: Yes   Outcome: patient tolerated procedure well with no complications   Post-procedure details: wound care instructions given    Skin cancer screening   Lentigines - Scattered tan macules - Due to sun exposure - Benign-appering, observe - Recommend daily broad spectrum sunscreen SPF 30+ to sun-exposed areas, reapply every 2 hours as needed. - Call for any changes  Seborrheic Keratoses - Stuck-on, waxy,  tan-brown papules and/or plaques  - Benign-appearing - Discussed benign etiology and prognosis. - Observe - Call for any changes  Melanocytic Nevi - Tan-brown and/or pink-flesh-colored symmetric macules and papules - Benign appearing on exam today - Observation - Call clinic for new or changing moles - Recommend daily use of broad spectrum spf 30+ sunscreen to sun-exposed areas.   Hemangiomas - Red papules - Discussed benign nature - Observe - Call for any changes  Actinic Damage - Chronic condition, secondary to cumulative UV/sun exposure - diffuse scaly erythematous macules with underlying dyspigmentation - Recommend daily broad spectrum sunscreen SPF 30+ to sun-exposed areas, reapply every 2 hours as needed.  - Staying in the shade or wearing long sleeves, sun glasses (UVA+UVB protection) and wide brim hats (4-inch brim around the entire circumference of the hat) are also recommended for sun protection.  - Call for new or changing lesions.  History of Dysplastic Nevus - R foot inf to her med maleolus (mod to severe - close to margin)  - No evidence of recurrence today - Recommend regular full body skin exams - Recommend daily broad spectrum sunscreen SPF 30+ to sun-exposed areas, reapply every 2 hours as needed.  - Call if any new or changing lesions are noted between office visits  Varicose Veins - pt c/o burning/stinging of the lower legs - Dilated blue, purple or red veins at the lower extremities - Reassured - These can be treated by sclerotherapy (a procedure to inject a medicine into the veins to make them disappear) if desired, but the treatment is not covered by insurance -  Will refer to vascular surgeon.  Skin cancer screening performed today.  Return in about 1 year (around 08/01/2021) for TBSE.  Luther Redo, CMA, am acting as scribe for Sarina Ser, MD .  Documentation: I have reviewed the above documentation for accuracy and completeness, and I agree  with the above.  Sarina Ser, MD

## 2020-08-05 ENCOUNTER — Encounter: Payer: Self-pay | Admitting: Dermatology

## 2020-08-06 ENCOUNTER — Telehealth: Payer: Self-pay

## 2020-08-06 ENCOUNTER — Other Ambulatory Visit: Payer: Self-pay

## 2020-08-06 NOTE — Telephone Encounter (Signed)
-----   Message from Virgel Manifold, MD sent at 07/23/2020 10:27 AM EDT ----- Herb Grays please let the patient know, her biopsies show h Pylori. I have sent 2 antibiotics to to her pharmacy, along with omeprazole twice daily.  Patient will need eradication testing in 4 weeks after completion of medications.  Please ensure clinic follow-up and schedule 6 to 8 weeks from now and we can discuss eradication testing then

## 2020-08-06 NOTE — Telephone Encounter (Signed)
Patient was called and informed to finish taking her antibiotics as the pharmacy stated that she started taking them on 07/27/2020. She also has a follow up appointment on 08/27/2020. Patient agreed to come in and then get checked for H pylori.

## 2020-08-07 ENCOUNTER — Encounter: Payer: Self-pay | Admitting: Family Medicine

## 2020-08-07 ENCOUNTER — Other Ambulatory Visit: Payer: Self-pay

## 2020-08-07 ENCOUNTER — Ambulatory Visit (INDEPENDENT_AMBULATORY_CARE_PROVIDER_SITE_OTHER): Payer: 59 | Admitting: Family Medicine

## 2020-08-07 VITALS — BP 104/67 | HR 84 | Temp 97.9°F | Wt 160.0 lb

## 2020-08-07 DIAGNOSIS — L237 Allergic contact dermatitis due to plants, except food: Secondary | ICD-10-CM | POA: Diagnosis not present

## 2020-08-07 MED ORDER — PREDNISONE 10 MG PO TABS
ORAL_TABLET | ORAL | 0 refills | Status: DC
Start: 2020-08-07 — End: 2020-08-27
  Filled 2020-08-07: qty 42, 12d supply, fill #0

## 2020-08-07 MED ORDER — TRIAMCINOLONE ACETONIDE 40 MG/ML IJ SUSP
40.0000 mg | Freq: Once | INTRAMUSCULAR | Status: AC
  Administered 2020-08-07: 40 mg via INTRAMUSCULAR

## 2020-08-07 MED ORDER — FAMOTIDINE 20 MG PO TABS
20.0000 mg | ORAL_TABLET | Freq: Two times a day (BID) | ORAL | 3 refills | Status: DC
  Filled 2020-08-07: qty 60, 30d supply, fill #0

## 2020-08-07 MED ORDER — HYDROXYZINE PAMOATE 25 MG PO CAPS
25.0000 mg | ORAL_CAPSULE | Freq: Three times a day (TID) | ORAL | 1 refills | Status: DC | PRN
  Filled 2020-08-07: qty 60, 20d supply, fill #0

## 2020-08-07 NOTE — Progress Notes (Signed)
BP 104/67   Pulse 84   Temp 97.9 F (36.6 C)   Wt 160 lb (72.6 kg)   SpO2 99%   BMI 27.46 kg/m    Subjective:    Patient ID: Karen Mess., female    DOB: 06/10/1970, 50 y.o.   MRN: [id number]  HPI: [name] is a 50 y.o. female  Chief Complaint  Patient presents with  . Rash    Patient states for a week she has poison ivy on her arms and its spreading to her chest, stomach and legs. Patient states rash is itchy and there is water draining for rash    RASH Duration:  1 week Location: generalized  Itching: yes Burning: yes Redness: yes Oozing: yes Scaling: yes Blisters: no Painful: yes Fevers: no Change in detergents/soaps/personal care products: no Recent illness: no Recent travel:no History of same: yes Context: worse Alleviating factors: nothing Treatments attempted:hydrocortisone cream, benadryl, OTC anit-fungal and lotion/moisturizer Shortness of breath: no  Throat/tongue swelling: no Myalgias/arthralgias: no  Relevant past medical, surgical, family and social history reviewed and updated as indicated. Interim medical history since our last visit reviewed. Allergies and medications reviewed and updated.  Review of Systems  Constitutional: Negative.   Respiratory: Negative.   Cardiovascular: Negative.   Gastrointestinal: Negative.   Musculoskeletal: Negative.   Skin: Positive for rash. Negative for color change, pallor and wound.  Psychiatric/Behavioral: Negative.     Per HPI unless specifically indicated above     Objective:    BP 104/67   Pulse 84   Temp 97.9 F (36.6 C)   Wt 160 lb (72.6 kg)   SpO2 99%   BMI 27.46 kg/m   Wt Readings from Last 3 Encounters:  08/07/20 160 lb (72.6 kg)  07/20/20 160 lb (72.6 kg)  07/03/20 162 lb (73.5 kg)    Physical Exam Vitals and nursing note reviewed.  Constitutional:      General: She is not in acute distress.    Appearance: Normal appearance. She is not ill-appearing, toxic-appearing or  diaphoretic.  HENT:     Head: Normocephalic and atraumatic.     Right Ear: External ear normal.     Left Ear: External ear normal.     Nose: Nose normal.     Mouth/Throat:     Mouth: Mucous membranes are moist.     Pharynx: Oropharynx is clear.  Eyes:     General: No scleral icterus.       Right eye: No discharge.        Left eye: No discharge.     Extraocular Movements: Extraocular movements intact.     Conjunctiva/sclera: Conjunctivae normal.     Pupils: Pupils are equal, round, and reactive to light.  Cardiovascular:     Rate and Rhythm: Normal rate and regular rhythm.     Pulses: Normal pulses.     Heart sounds: Normal heart sounds. No murmur heard. No friction rub. No gallop.   Pulmonary:     Effort: Pulmonary effort is normal. No respiratory distress.     Breath sounds: Normal breath sounds. No stridor. No wheezing, rhonchi or rales.  Chest:     Chest wall: No tenderness.  Musculoskeletal:        General: Normal range of motion.     Cervical back: Normal range of motion and neck supple.  Skin:    General: Skin is warm and dry.     Capillary Refill: Capillary refill takes less than 2 seconds.  Coloration: Skin is not jaundiced or pale.     Findings: Rash (erythematous, welps on arms, legs and abdomen) present. No bruising, erythema or lesion.  Neurological:     General: No focal deficit present.     Mental Status: She is alert and oriented to person, place, and time. Mental status is at baseline.  Psychiatric:        Mood and Affect: Mood normal.        Behavior: Behavior normal.        Thought Content: Thought content normal.        Judgment: Judgment normal.     Results for orders placed or performed during the hospital encounter of 07/20/20  Surgical pathology  Result Value Ref Range   SURGICAL PATHOLOGY      SURGICAL PATHOLOGY CASE: [id number] PATIENT: [facility] Report     Specimen Submitted: A. Duodenum, flattened mucosa;  cbx B. Stomach erosions; cbx C. GEJ; cbx D. Esophagus; cbx  Clinical History: I dysphagia R13.10.  Screening colonoscopy Z12.11. Schatzki ring, salmon colored mucosa of esophagus, hiatal hernia, gastric erosions, flattened mucosa duodenum, external hemorrhoids    DIAGNOSIS: A. DUODENUM; COLD BIOPSY: - ENTERIC MUCOSA WITH PRESERVED VILLOUS ARCHITECTURE AND NO SIGNIFICANT HISTOPATHOLOGIC CHANGE. - NEGATIVE FOR FEATURES OF CELIAC, DYSPLASIA, AND MALIGNANCY.  B. GASTRIC EROSIONS; COLD BIOPSY: - CHRONIC ACTIVE GASTRITIS WITH HELICOBACTER PYLORI TYPE ORGANISMS. - NEGATIVE FOR DYSPLASIA AND MALIGNANCY.  C. GASTROESOPHAGEAL JUNCTION; COLD BIOPSY: - SQUAMOCOLUMNAR MUCOSA WITH FEATURES OF REFLUX GASTROESOPHAGITIS. - NEGATIVE FOR INTESTINAL METAPLASIA, DYSPLASIA, AND MALIGNANCY.  D. ESOPHAGUS; COLD BIOPSY: - BENIGN SQUAMOUS  MUCOSA WITH MODERATE ACANTHOSIS. - NO INCREASE IN INTRAEPITHELIAL EOSINOPHILS (LESS THAN 2 PER HPF). - NEGATIVE FOR DYSPLASIA AND MALIGNANCY.  GROSS DESCRIPTION: A. Labeled: Cold biopsy flattened mucosa duodenum Received: In formalin Collection time: 10:07 AM on 07/20/2020 Placed into formalin time: 10:07 AM on 07/20/2020 Tissue fragment(s): 2 Size: 0.7 x 0.2 x 0.1 cm Description: Aggregate of tan tissue fragments Entirely submitted in one cassette.  B. Labeled: Cold biopsy gastric erosions Received: In formalin Collection time: 10:09 AM on 07/20/2020 Placed into formalin time: 10:09 AM on 07/20/2020 Tissue fragment(s): Multiple Size: 1 x 0.3 x 0.2 cm Description: Aggregate of tan tissue fragments Entirely submitted in one cassette.  C. Labeled: Cold biopsy GEJ for Barrett's Received: In formalin Collection time: 10:20 AM on 07/20/2020 Placed into formalin time: 10:20 AM on 07/20/2020 Tissue fragment(s): 4 Size: 0.5 x 0.5 x 0.2 cm Description: Aggregate of tan t issue fragments Entirely submitted in one cassette.  D. Labeled: Cold biopsy esophagus for  EOE Received: In formalin Collection time: 10:24 AM on 07/20/2020 Placed into formalin time: 10:24 AM on 07/20/2020 Tissue fragment(s): Multiple Size: 0.7 x 0.3 x 0.1 cm Description: Aggregate of pale white tissue fragments Entirely submitted in one cassette.  BD 07/20/20  Final Diagnosis performed by Allena Napoleon, MD.   Electronically signed 07/23/2020 9:28:52AM The electronic signature indicates that the named Attending Pathologist has evaluated the specimen Technical component performed at [facility], [address], [place], [place] [address] Lab: [phone] Dir: Rush Farmer, MD, MMM  Professional component performed at [facility], [facility], [place], [place], [place] [address] Lab: [phone] Dir: Dellia Nims. Reuel Derby, MD       Assessment & Plan:   Problem List Items Addressed This Visit      Musculoskeletal and Integument   Contact dermatitis - Primary   Relevant Medications   triamcinolone acetonide (KENALOG-40) injection 40 mg (  Start on 08/07/2020  8:45 AM)       Follow up plan: Return if symptoms worsen or fail to improve.

## 2020-08-27 ENCOUNTER — Encounter: Payer: Self-pay | Admitting: Gastroenterology

## 2020-08-27 ENCOUNTER — Other Ambulatory Visit: Payer: Self-pay

## 2020-08-27 ENCOUNTER — Ambulatory Visit: Payer: 59 | Admitting: Gastroenterology

## 2020-08-27 VITALS — BP 100/69 | HR 91 | Wt 162.0 lb

## 2020-08-27 DIAGNOSIS — Z8619 Personal history of other infectious and parasitic diseases: Secondary | ICD-10-CM

## 2020-08-27 DIAGNOSIS — Z7689 Persons encountering health services in other specified circumstances: Secondary | ICD-10-CM | POA: Diagnosis not present

## 2020-08-27 NOTE — Progress Notes (Signed)
Karen Antigua, MD [address]  [place]  [place], [place] [address]  Main: [phone]  Fax: [phone]   Primary Care Physician: Karen Roys, DO   Chief complaint: H. Pylori, abdominal pain  HPI: Karen Walker is a 50 y.o. female here for follow-up of abdominal cramping and found to have H. pylori on gastric biopsies.  Patient completed triple therapy and states her abdominal discomfort has completely resolved. The patient denies abdominal or flank pain, anorexia, nausea or vomiting, dysphagia, change in bowel habits or black or bloody stools or weight loss.  Patient was evaluated with Spanish interpreter  Current Outpatient Medications  Medication Sig Dispense Refill  . cetirizine (ZYRTEC) 10 MG tablet TAKE 1 TABLET (10 MG TOTAL) BY MOUTH DAILY. 90 tablet 3  . COVID-19 mRNA Vac-TriS, Pfizer, SUSP injection USE AS DIRECTED .3 mL 0  . hydrOXYzine (VISTARIL) 25 MG capsule Take 1 capsule (25 mg total) by mouth every 8 (eight) hours as needed. 60 capsule 1  . montelukast (SINGULAIR) 10 MG tablet TAKE 1 TABLET (10 MG TOTAL) BY MOUTH AT BEDTIME. 90 tablet 3   No current facility-administered medications for this visit.    Allergies as of 08/27/2020 - Review Complete 08/27/2020  Allergen Reaction Noted  . Codeine Other (See Comments) 08/11/2014  . Pollen extract  08/11/2014    ROS:  General: Negative for anorexia, weight loss, fever, chills, fatigue, weakness. ENT: Negative for hoarseness, difficulty swallowing , nasal congestion. CV: Negative for chest pain, angina, palpitations, dyspnea on exertion, peripheral edema.  Respiratory: Negative for dyspnea at rest, dyspnea on exertion, cough, sputum, wheezing.  GI: See history of present illness. GU:  Negative for dysuria, hematuria, urinary incontinence, urinary frequency, nocturnal urination.  Endo: Negative for unusual weight change.    Physical Examination:   BP 100/69   Pulse 91   Wt 162 lb (73.5 kg)    BMI 27.81 kg/m   General: Well-nourished, well-developed in no acute distress.  Eyes: No icterus. Conjunctivae pink. Mouth: Oropharyngeal mucosa moist and pink , no lesions erythema or exudate. Neck: Supple, Trachea midline Abdomen: Bowel sounds are normal, nontender, nondistended, no hepatosplenomegaly or masses, no abdominal bruits or hernia , no rebound or guarding.   Extremities: No lower extremity edema. No clubbing or deformities. Neuro: Alert and oriented x 3.  Grossly intact. Skin: Warm and dry, no jaundice.   Psych: Alert and cooperative, normal mood and affect.   Labs: CMP     Component Value Date/Time   NA 141 12/26/2019 1447   NA 138 08/22/2013 1432   K 4.0 12/26/2019 1447   K 3.3 (L) 08/22/2013 1432   CL 104 12/26/2019 1447   CL 106 08/22/2013 1432   CO2 24 12/26/2019 1447   CO2 24 08/22/2013 1432   GLUCOSE 84 12/26/2019 1447   GLUCOSE 99 11/01/2015 1450   GLUCOSE 97 08/22/2013 1432   BUN 19 12/26/2019 1447   BUN 6 (L) 08/22/2013 1432   CREATININE 0.76 12/26/2019 1447   CREATININE 0.62 08/22/2013 1432   CALCIUM 9.2 12/26/2019 1447   CALCIUM 9.5 08/22/2013 1432   PROT 6.8 12/26/2019 1447   PROT 8.8 (H) 08/22/2013 1432   ALBUMIN 4.0 12/26/2019 1447   ALBUMIN 4.3 08/22/2013 1432   AST 20 12/26/2019 1447   AST 25 08/22/2013 1432   ALT 27 12/26/2019 1447   ALT 64 08/22/2013 1432   ALKPHOS 66 12/26/2019 1447   ALKPHOS 52 08/22/2013 1432   BILITOT 0.4  12/26/2019 1447   BILITOT 0.7 08/22/2013 1432   GFRNONAA 92 12/26/2019 1447   GFRNONAA >60 08/22/2013 1432   GFRAA 107 12/26/2019 1447   GFRAA >60 08/22/2013 1432   Lab Results  Component Value Date   WBC 5.0 12/26/2019   HGB 11.9 12/26/2019   HCT 36.1 12/26/2019   MCV 89 12/26/2019   PLT 177 12/26/2019    Imaging Studies: No results found.  Assessment and Plan:   Karen Walker. is a 50 y.o. y/o female here for follow-up of abdominal discomfort and H. Pylori.  Abdominal discomfort has  completely resolved since treatment of H. pylori with triple therapy  H. pylori eradication testing indicated at this time  If negative, no further interventions needed unless symptoms recur      Dr Karen Walker

## 2020-08-29 LAB — H. PYLORI BREATH TEST: H pylori Breath Test: POSITIVE — AB

## 2020-08-30 ENCOUNTER — Other Ambulatory Visit: Payer: Self-pay

## 2020-08-30 MED ORDER — METRONIDAZOLE 250 MG PO TABS
250.0000 mg | ORAL_TABLET | Freq: Four times a day (QID) | ORAL | 0 refills | Status: AC
  Filled 2020-08-30: qty 56, 14d supply, fill #0

## 2020-08-30 MED ORDER — BISMUTH SUBSALICYLATE 262 MG PO CHEW
524.0000 mg | CHEWABLE_TABLET | Freq: Four times a day (QID) | ORAL | 0 refills | Status: AC
  Filled 2020-08-30: qty 112, 14d supply, fill #0

## 2020-08-30 MED ORDER — DOXYCYCLINE HYCLATE 100 MG PO CAPS
100.0000 mg | ORAL_CAPSULE | Freq: Two times a day (BID) | ORAL | 0 refills | Status: AC
  Filled 2020-08-30: qty 28, 14d supply, fill #0

## 2020-08-30 MED ORDER — OMEPRAZOLE 20 MG PO CPDR
20.0000 mg | DELAYED_RELEASE_CAPSULE | Freq: Two times a day (BID) | ORAL | 0 refills | Status: DC
  Filled 2020-08-30: qty 28, 14d supply, fill #0

## 2020-08-30 NOTE — Addendum Note (Signed)
Addended by: Vonda Antigua on: 08/30/2020 03:30 PM   Modules accepted: Orders

## 2020-08-30 NOTE — Addendum Note (Signed)
Addended by: Vonda Antigua on: 08/30/2020 03:34 PM   Modules accepted: Orders

## 2020-10-10 ENCOUNTER — Encounter: Payer: 59 | Admitting: Family Medicine

## 2020-10-10 ENCOUNTER — Encounter: Payer: Self-pay | Admitting: Family Medicine

## 2020-10-25 ENCOUNTER — Ambulatory Visit: Payer: Self-pay | Admitting: *Deleted

## 2020-10-25 NOTE — Telephone Encounter (Signed)
Left wrist/thumb pain associated with movement that has worsened over the last two weeks. Has a soft cyst where the thumb meets the wrist. Does repetitive movements with that hand at work. Very sore to touch area. Mild swelling with occasional tingling of the thumb.  No medications/treatments tried. No availability with pcp, appointment made for Friday with Dr. Neomia Dear. Patient much appreciative to be seen. Scheduled via DT.    Reason for Disposition  [1] MILD pain (e.g., does not interfere with normal activities) AND [2] present > 7 days  Answer Assessment - Initial Assessment Questions 1. ONSET: "When did the pain start?"     2 months and worsening 2. LOCATION: "Where is the pain located?"     Left wrist and thumb 3. PAIN: "How bad is the pain?" (Scale 1-10; or mild, moderate, severe)   - MILD (1-3): doesn't interfere with normal activities   - MODERATE (4-7): interferes with normal activities (e.g., work or school) or awakens from sleep   - SEVERE (8-10): excruciating pain, unable to use hand at all     varies 4. WORK OR EXERCISE: "Has there been any recent work or exercise that involved this part (i.e., hand or wrist) of the body?"     Movements at work 5. CAUSE: "What do you think is causing the pain?"     From work heavy things at work she thinks 6. AGGRAVATING FACTORS: "What makes the pain worse?" (e.g., using computer)     Does repetitive motions with this area 7. OTHER SYMPTOMS: "Do you have any other symptoms?" (e.g., neck pain, swelling, rash, numbness, fever)     Sometimes with tingling in it 8. PREGNANCY: "Is there any chance you are pregnant?" "When was your last menstrual period?"     na  Protocols used: Hand and Wrist Pain-A-AH

## 2020-10-25 NOTE — Telephone Encounter (Signed)
FYI scheduled tomorrow with Dr Neomia Dear

## 2020-10-25 NOTE — Telephone Encounter (Signed)
Patient speaks English-does not need interpreter.

## 2020-10-26 ENCOUNTER — Telehealth: Payer: Self-pay | Admitting: Family Medicine

## 2020-10-26 ENCOUNTER — Ambulatory Visit
Admission: RE | Admit: 2020-10-26 | Discharge: 2020-10-26 | Disposition: A | Discharge status: Home / Self Care | Payer: 59 | Source: Ambulatory Visit | Attending: Internal Medicine | Admitting: Internal Medicine

## 2020-10-26 ENCOUNTER — Encounter: Payer: Self-pay | Admitting: Internal Medicine

## 2020-10-26 ENCOUNTER — Ambulatory Visit: Payer: 59 | Admitting: Internal Medicine

## 2020-10-26 ENCOUNTER — Other Ambulatory Visit: Payer: Self-pay

## 2020-10-26 ENCOUNTER — Ambulatory Visit
Admission: RE | Admit: 2020-10-26 | Discharge: 2020-10-26 | Disposition: A | Discharge status: Home / Self Care | Payer: 59 | Attending: Internal Medicine | Admitting: Internal Medicine

## 2020-10-26 VITALS — BP 113/76 | HR 62 | Temp 97.1°F | Wt 156.0 lb

## 2020-10-26 DIAGNOSIS — M25532 Pain in left wrist: Secondary | ICD-10-CM | POA: Insufficient documentation

## 2020-10-26 MED ORDER — GABAPENTIN 100 MG PO CAPS
100.0000 mg | ORAL_CAPSULE | Freq: Every day | ORAL | 0 refills | Status: DC
  Filled 2020-10-26: qty 30, 30d supply, fill #0

## 2020-10-26 NOTE — Progress Notes (Signed)
BP 113/76   Pulse 62   Temp (!) 97.1 F (36.2 C) (Oral)   Wt 156 lb (70.8 kg)   SpO2 99%   BMI 26.78 kg/m    Subjective:    Patient ID: Karen Mess., female    DOB: 1970/10/01, 50 y.o.   MRN: [id number]  Chief Complaint  Patient presents with  . Wrist Pain    Left wrist pain for past 2 months following growth of cyst on wrist.    HPI: Karen Eymard. is a 50 y.o. female  Wrist Pain  The pain is present in the left wrist (has a cyst on the left wrist co pain x 2 weeks. 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 This is a new problem. The current episode started 1 to 4 weeks ago. The problem has been gradually worsening. The quality of the pain is described as aching. The pain is at a severity of 3/10. Associated symptoms include an inability to bear weight. Associated symptoms comments: Has tingling and numbness as well .   Chief Complaint  Patient presents with  . Wrist Pain    Left wrist pain for past 2 months following growth of cyst on wrist.    Relevant past medical, surgical, family and social history reviewed and updated as indicated. Interim medical history since our last visit reviewed. Allergies and medications reviewed and updated.  Review of Systems  Per HPI unless specifically indicated above     Objective:    BP 113/76   Pulse 62   Temp (!) 97.1 F (36.2 C) (Oral)   Wt 156 lb (70.8 kg)   SpO2 99%   BMI 26.78 kg/m   Wt Readings from Last 3 Encounters:  10/26/20 156 lb (70.8 kg)  08/27/20 162 lb (73.5 kg)  08/07/20 160 lb (72.6 kg)    Physical Exam Vitals and nursing note reviewed.  Constitutional:      General: She is not in acute distress.    Appearance: Normal appearance. She is not ill-appearing or diaphoretic.  Eyes:     Conjunctiva/sclera: Conjunctivae normal.  Pulmonary:     Breath sounds: No rhonchi.  Abdominal:     General: Abdomen is flat.  Bowel sounds are normal. There is no distension.     Palpations: Abdomen is soft. There is no mass.     Tenderness: There is no abdominal tenderness. There is no guarding.  Musculoskeletal:        General: Swelling and tenderness present.  Skin:    General: Skin is warm and dry.     Coloration: Skin is not jaundiced.     Findings: No erythema.  Neurological:     Mental Status: She is alert.    Results for orders placed or performed in visit on 08/27/20  H. pylori breath test  Result Value Ref Range   H pylori Breath Test Positive (A) Negative        Current Outpatient Medications:  .  cetirizine (ZYRTEC) 10 MG tablet, TAKE 1 TABLET (10 MG TOTAL) BY MOUTH DAILY. (Patient not taking: Reported on 10/26/2020), Disp: 90 tablet, Rfl: 3 .  COVID-19 mRNA Vac-TriS, Pfizer, SUSP injection, USE AS DIRECTED (Patient not taking: Reported on 10/26/2020), Disp: .3 mL, Rfl: 0 .  hydrOXYzine (VISTARIL) 25 MG capsule, Take 1 capsule (25 mg total) by mouth every 8 (eight) hours as needed. (Patient  not taking: Reported on 10/26/2020), Disp: 60 capsule, Rfl: 1 .  montelukast (SINGULAIR) 10 MG tablet, TAKE 1 TABLET (10 MG TOTAL) BY MOUTH AT BEDTIME. (Patient not taking: Reported on 10/26/2020), Disp: 90 tablet, Rfl: 3 .  omeprazole (PRILOSEC) 20 MG capsule, Take 1 capsule (20 mg total) by mouth in the morning and at bedtime for 14 days., Disp: 28 capsule, Rfl: 0    Assessment & Plan:  Left wrist pain :  Use ibuburfen 800 mg q 8 hrly as needed  Take gabapenitn at night only.  Check xray of the left wrist.  Fu with pcp Problem List Items Addressed This Visit   None Visit Diagnoses     Left wrist pain    -  Primary        No orders of the defined types were placed in this encounter.    No orders of the defined types were placed in this encounter.    Follow up plan: No follow-ups on file.

## 2020-10-26 NOTE — Telephone Encounter (Signed)
Patient calling in about the results of her x-ray, please call back

## 2020-10-29 NOTE — Progress Notes (Signed)
Please let pt know this was normal.

## 2020-10-29 NOTE — Telephone Encounter (Signed)
Results given on 10/29/20, see result notes.

## 2020-11-07 ENCOUNTER — Telehealth: Payer: Self-pay

## 2020-11-07 NOTE — Telephone Encounter (Signed)
Left detailed msg on VM per HIPAA  

## 2020-11-07 NOTE — Telephone Encounter (Signed)
-----   Message from Virgel Manifold, MD sent at 11/06/2020 12:43 PM EDT ----- Karen Walker, this pt had asked me about how to get her repeat H Pylori test done. She needs eradication testing and the H pylori breath test for this is already in her chart. Please let her know the lab hours so she can have this completed.

## 2020-11-07 NOTE — Telephone Encounter (Signed)
Pt is aware as instructed and expressed understanding 

## 2020-11-12 ENCOUNTER — Other Ambulatory Visit: Payer: Self-pay

## 2020-11-12 ENCOUNTER — Ambulatory Visit: Payer: 59 | Admitting: Family Medicine

## 2020-11-12 ENCOUNTER — Encounter: Payer: Self-pay | Admitting: Family Medicine

## 2020-11-12 VITALS — BP 96/62 | HR 78 | Temp 98.2°F | Wt 156.6 lb

## 2020-11-12 DIAGNOSIS — L237 Allergic contact dermatitis due to plants, except food: Secondary | ICD-10-CM | POA: Diagnosis not present

## 2020-11-12 DIAGNOSIS — M654 Radial styloid tenosynovitis [de Quervain]: Secondary | ICD-10-CM | POA: Diagnosis not present

## 2020-11-12 MED ORDER — DICLOFENAC SODIUM 1 % EX GEL
4.0000 g | Freq: Four times a day (QID) | CUTANEOUS | 3 refills | Status: DC
  Filled 2020-11-12: qty 100, 6d supply, fill #0

## 2020-11-12 MED ORDER — TRIAMCINOLONE ACETONIDE 40 MG/ML IJ SUSP
40.0000 mg | Freq: Once | INTRAMUSCULAR | Status: AC
  Administered 2020-11-12: 40 mg via INTRAMUSCULAR

## 2020-11-12 MED ORDER — PREDNISONE 10 MG PO TABS
ORAL_TABLET | ORAL | 0 refills | Status: DC
  Filled 2020-11-12: qty 42, 12d supply, fill #0

## 2020-11-12 NOTE — Progress Notes (Signed)
BP 96/62   Pulse 78   Temp 98.2 F (36.8 C) (Oral)   Wt 156 lb 9.6 oz (71 kg)   SpO2 98%   BMI 26.88 kg/m    Subjective:    Patient ID: Karen Mess., female    DOB: 08-11-1970, 50 y.o.   MRN: [id number]  HPI: Karen Walker is a 50 y.o. female  Chief Complaint  Patient presents with   Wrist Pain    2 week f/up- pt states her wrist is still bothering her    Rash    Pt states she has a rash on her upper R thigh that she would like looked at    HAND PAIN Duration: months Involved hand: left Mechanism of injury: unknown Location:  radial Onset: gradual Severity: severe  Quality: sharp and shooting Frequency:  with movement Radiation: into her forearm Relief with NSAIDs?: No NSAIDs Taken Weakness: yes Numbness: yes Redness: no Swelling:no Bruising: no Fevers: no  RASH Duration:   few days   Location: L leg and thigh  Itching: yes Burning: no Redness: yes Oozing: no Scaling: no Blisters: no Painful: yes Fevers: no Change in detergents/soaps/personal care products: no Recent illness: no Recent travel:no History of same: yes Context: worse Alleviating factors: nothing Treatments attempted:nothing Shortness of breath: no  Throat/tongue swelling: no Myalgias/arthralgias: no  Relevant past medical, surgical, family and social history reviewed and updated as indicated. Interim medical history since our last visit reviewed. Allergies and medications reviewed and updated.  Review of Systems  Constitutional: Negative.   Respiratory: Negative.    Cardiovascular: Negative.   Gastrointestinal: Negative.   Musculoskeletal:  Positive for arthralgias and myalgias. Negative for back pain, gait problem, joint swelling, neck pain and neck stiffness.  Skin:  Positive for rash. Negative for color change, pallor and wound.  Neurological: Negative.   Psychiatric/Behavioral: Negative.     Per HPI unless specifically indicated above     Objective:    BP 96/62    Pulse 78   Temp 98.2 F (36.8 C) (Oral)   Wt 156 lb 9.6 oz (71 kg)   SpO2 98%   BMI 26.88 kg/m   Wt Readings from Last 3 Encounters:  11/12/20 156 lb 9.6 oz (71 kg)  10/26/20 156 lb (70.8 kg)  08/27/20 162 lb (73.5 kg)    Physical Exam Vitals and nursing note reviewed.  Constitutional:      General: She is not in acute distress.    Appearance: Normal appearance. She is not ill-appearing, toxic-appearing or diaphoretic.  HENT:     Head: Normocephalic and atraumatic.     Right Ear: External ear normal.     Left Ear: External ear normal.     Nose: Nose normal.     Mouth/Throat:     Mouth: Mucous membranes are moist.     Pharynx: Oropharynx is clear.  Eyes:     General: No scleral icterus.       Right eye: No discharge.        Left eye: No discharge.     Extraocular Movements: Extraocular movements intact.     Conjunctiva/sclera: Conjunctivae normal.     Pupils: Pupils are equal, round, and reactive to light.  Cardiovascular:     Rate and Rhythm: Normal rate and regular rhythm.     Pulses: Normal pulses.     Heart sounds: Normal heart sounds. No murmur heard.   No friction rub. No gallop.  Pulmonary:     Effort:  Pulmonary effort is normal. No respiratory distress.     Breath sounds: Normal breath sounds. No stridor. No wheezing, rhonchi or rales.  Chest:     Chest wall: No tenderness.  Musculoskeletal:        General: Tenderness (over L thumb, + finklesteins) present. Normal range of motion.     Cervical back: Normal range of motion and neck supple.  Skin:    General: Skin is warm and dry.     Capillary Refill: Capillary refill takes less than 2 seconds.     Coloration: Skin is not jaundiced or pale.     Findings: Rash (papular rash on her thighs bilaterally) present. No bruising, erythema or lesion.  Neurological:     General: No focal deficit present.     Mental Status: She is alert and oriented to person, place, and time. Mental status is at baseline.   Psychiatric:        Mood and Affect: Mood normal.        Behavior: Behavior normal.        Thought Content: Thought content normal.        Judgment: Judgment normal.    Results for orders placed or performed in visit on 08/27/20  H. pylori breath test  Result Value Ref Range   H pylori Breath Test Positive (A) Negative      Assessment & Plan:   Problem List Items Addressed This Visit   None Visit Diagnoses     De Quervain's tenosynovitis, right    -  Primary   Exercises given. Will treat with prednisone and voltaren. Call if not getting better or getting worse.    Poison ivy dermatitis       Will treat with triamcinalone and prednisone. Call with any concerns. Continue to monitor.    Relevant Medications   triamcinolone acetonide (KENALOG-40) injection 40 mg (Completed)        Follow up plan: Return after 9/20 for physical.

## 2020-11-13 ENCOUNTER — Encounter: Payer: Self-pay | Admitting: Family Medicine

## 2020-11-26 ENCOUNTER — Telehealth: Payer: Self-pay | Admitting: Family Medicine

## 2020-11-26 ENCOUNTER — Other Ambulatory Visit: Payer: Self-pay

## 2020-11-26 NOTE — Telephone Encounter (Signed)
Called patient to discuss getting her Shingrix vaccination, no answer, left a voicemail for patient stating that is is ok to received vaccination and will just have to call office and set up an appointment for the vaccination.     [place] for nurse triage to give results if patient calls back.

## 2020-11-26 NOTE — Telephone Encounter (Signed)
Pt is calling to schedule shringrix vaccine on 9/2//22. Wanting to be sure this is ok with Dr. Wynetta Emery? CB- [phone]

## 2020-11-26 NOTE — Telephone Encounter (Signed)
Routing to provider. Any reason the patient can not get the vaccine?

## 2020-12-13 ENCOUNTER — Encounter: Payer: Self-pay | Admitting: Family Medicine

## 2020-12-13 ENCOUNTER — Other Ambulatory Visit: Payer: Self-pay

## 2020-12-13 ENCOUNTER — Ambulatory Visit: Payer: 59 | Admitting: Family Medicine

## 2020-12-13 VITALS — BP 99/65 | HR 83 | Temp 97.9°F | Ht 64.02 in | Wt 155.6 lb

## 2020-12-13 DIAGNOSIS — M654 Radial styloid tenosynovitis [de Quervain]: Secondary | ICD-10-CM | POA: Diagnosis not present

## 2020-12-13 DIAGNOSIS — Z23 Encounter for immunization: Secondary | ICD-10-CM

## 2020-12-13 NOTE — Progress Notes (Signed)
BP 99/65   Pulse 83   Temp 97.9 F (36.6 C) (Oral)   Ht 5' 4.02" (1.626 m)   Wt 155 lb 9.6 oz (70.6 kg)   SpO2 97%   BMI 26.70 kg/m    Subjective:    Patient ID: Karen Mess., female    DOB: 10-15-1970, 50 y.o.   MRN: [id number]  HPI: Karen Walker is a 50 y.o. female  Chief Complaint  Patient presents with   Wrist Pain    Left wrist pain for past 3 months   WRIST PAIN  Duration: months Involved wrist: left Mechanism of injury:  unknown Location: radial Onset: gradual Severity: severe  Quality:  sharp and shooting Frequency: with movement Radiation: into her forearm Aggravating factors: movement  Alleviating factors: nothing  Status: worse Treatments attempted: rest, ice, heat, APAP, ibuprofen, aleve, and HEP    Relief with NSAIDs?:  mild Weakness: yes Numbness: yes  Redness: no Bruising: no Swelling: no Fevers: no  Relevant past medical, surgical, family and social history reviewed and updated as indicated. Interim medical history since our last visit reviewed. Allergies and medications reviewed and updated.  Review of Systems  Constitutional: Negative.   HENT: Negative.    Respiratory: Negative.    Cardiovascular: Negative.   Gastrointestinal: Negative.   Musculoskeletal:  Positive for arthralgias and myalgias. Negative for back pain, gait problem, joint swelling, neck pain and neck stiffness.  Skin: Negative.   Psychiatric/Behavioral: Negative.     Per HPI unless specifically indicated above     Objective:    BP 99/65   Pulse 83   Temp 97.9 F (36.6 C) (Oral)   Ht 5' 4.02" (1.626 m)   Wt 155 lb 9.6 oz (70.6 kg)   SpO2 97%   BMI 26.70 kg/m   Wt Readings from Last 3 Encounters:  12/13/20 155 lb 9.6 oz (70.6 kg)  11/12/20 156 lb 9.6 oz (71 kg)  10/26/20 156 lb (70.8 kg)    Physical Exam Vitals and nursing note reviewed.  Constitutional:      General: She is not in acute distress.    Appearance: Normal appearance. She is not  ill-appearing, toxic-appearing or diaphoretic.  HENT:     Head: Normocephalic and atraumatic.     Right Ear: External ear normal.     Left Ear: External ear normal.     Nose: Nose normal.     Mouth/Throat:     Mouth: Mucous membranes are moist.     Pharynx: Oropharynx is clear.  Eyes:     General: No scleral icterus.       Right eye: No discharge.        Left eye: No discharge.     Extraocular Movements: Extraocular movements intact.     Conjunctiva/sclera: Conjunctivae normal.     Pupils: Pupils are equal, round, and reactive to light.  Cardiovascular:     Rate and Rhythm: Normal rate and regular rhythm.     Pulses: Normal pulses.     Heart sounds: Normal heart sounds. No murmur heard.   No friction rub. No gallop.  Pulmonary:     Effort: Pulmonary effort is normal. No respiratory distress.     Breath sounds: Normal breath sounds. No stridor. No wheezing, rhonchi or rales.  Chest:     Chest wall: No tenderness.  Musculoskeletal:        General: Tenderness (radial wrist with swollen vein) present.     Cervical back: Normal range of  motion and neck supple.  Skin:    General: Skin is warm and dry.     Capillary Refill: Capillary refill takes less than 2 seconds.     Coloration: Skin is not jaundiced or pale.     Findings: No bruising, erythema, lesion or rash.  Neurological:     General: No focal deficit present.     Mental Status: She is alert and oriented to person, place, and time. Mental status is at baseline.  Psychiatric:        Mood and Affect: Mood normal.        Behavior: Behavior normal.        Thought Content: Thought content normal.        Judgment: Judgment normal.    Results for orders placed or performed in visit on 08/27/20  H. pylori breath test  Result Value Ref Range   H pylori Breath Test Positive (A) Negative      Assessment & Plan:   Problem List Items Addressed This Visit   None Visit Diagnoses     De Quervain's tenosynovitis, right    -   Primary   Not improving. Declines referral to ortho now. Would like PT. Referral generated today. Call with any concerns.    Relevant Orders   Ambulatory referral to Physical Therapy        Follow up plan: Return As scheduled for physical.

## 2020-12-18 ENCOUNTER — Telehealth: Payer: Self-pay | Admitting: Family Medicine

## 2020-12-18 NOTE — Telephone Encounter (Signed)
Copied from [facility] [id number]. Topic: Quick Communication - Rx Refill/Question >> Dec 18, 2020  2:24 PM Leward Quan A wrote: Medication: valACYclovir (VALTREX) 500 MG tablet  Please call patient when sent to pharmacy  Has the patient contacted their pharmacy? No. (Agent: If no, request that the patient contact the pharmacy for the refill.) (Agent: If yes, when and what did the pharmacy advise?)  Preferred Pharmacy (with phone number or street name): [facility]  Phone:  [phone] Fax:  [phone]     Agent: Please be advised that RX refills may take up to 3 business days. We ask that you follow-up with your pharmacy.

## 2020-12-19 ENCOUNTER — Other Ambulatory Visit: Payer: Self-pay

## 2020-12-19 MED ORDER — VALACYCLOVIR HCL 500 MG PO TABS
500.0000 mg | ORAL_TABLET | Freq: Two times a day (BID) | ORAL | 0 refills | Status: DC
  Filled 2020-12-19: qty 10, 5d supply, fill #0

## 2020-12-19 NOTE — Telephone Encounter (Signed)
Medication sent to the pharmacy.

## 2020-12-19 NOTE — Telephone Encounter (Signed)
Patient states she received a missed call from Nurse but unsure what it was regarding. Patient checking on the status of medication request

## 2020-12-19 NOTE — Telephone Encounter (Signed)
Routing to provider to advise. Looks like the patient has been on Acyclovir and Valcyclovir in the past.

## 2020-12-28 ENCOUNTER — Other Ambulatory Visit: Payer: Self-pay

## 2021-01-01 ENCOUNTER — Encounter: Payer: Self-pay | Admitting: Occupational Therapy

## 2021-01-01 ENCOUNTER — Ambulatory Visit: Payer: 59 | Attending: Family Medicine | Admitting: Occupational Therapy

## 2021-01-01 DIAGNOSIS — M25642 Stiffness of left hand, not elsewhere classified: Secondary | ICD-10-CM | POA: Insufficient documentation

## 2021-01-01 DIAGNOSIS — M25632 Stiffness of left wrist, not elsewhere classified: Secondary | ICD-10-CM | POA: Insufficient documentation

## 2021-01-01 DIAGNOSIS — M25532 Pain in left wrist: Secondary | ICD-10-CM | POA: Insufficient documentation

## 2021-01-01 DIAGNOSIS — M6281 Muscle weakness (generalized): Secondary | ICD-10-CM | POA: Diagnosis not present

## 2021-01-01 DIAGNOSIS — M654 Radial styloid tenosynovitis [de Quervain]: Secondary | ICD-10-CM | POA: Diagnosis not present

## 2021-01-01 NOTE — Therapy (Signed)
[facility] [address] [place], [address] Phone: [phone]   Fax:  [phone]  Occupational Therapy Evaluation  Patient Details  Name: Karen Walker. MRN: [id number] Date of Birth: May 13, 1970 Referring Provider (OT): DR Wynetta Emery   Encounter Date: 01/01/2021   OT End of Session - 01/01/21 1352     Visit Number 1    Number of Visits 10    Date for OT Re-Evaluation 02/12/21    OT Start Time 1601    OT Stop Time 1333    OT Time Calculation (min) 63 min    Behavior During Therapy [facility] for tasks assessed/performed             Past Medical History:  Diagnosis Date   Allergic rhinitis    Anxiety    Breast lump on right side at 8 o'clock position 12/06/2014   Depression    Dysplastic nevus 02/14/2020   Mod to severe - close to margin R foot inf to her med maleolus   History of skin cancer    Hx of migraine headaches    light sensivity, unilateral HA   Urticaria     Past Surgical History:  Procedure Laterality Date   ABDOMINAL HYSTERECTOMY  2014   BREAST CYST ASPIRATION Right 2013   negative. Done at Dr. Dwyane Luo office   COLONOSCOPY WITH PROPOFOL N/A 07/20/2020   Procedure: COLONOSCOPY WITH PROPOFOL;  Surgeon: Virgel Manifold, MD;  Location: [facility] ENDOSCOPY;  Service: Endoscopy;  Laterality: N/A;  SPANISH INTERPRETER   ESOPHAGOGASTRODUODENOSCOPY (EGD) WITH PROPOFOL N/A 07/20/2020   Procedure: ESOPHAGOGASTRODUODENOSCOPY (EGD) WITH PROPOFOL;  Surgeon: Virgel Manifold, MD;  Location: [facility] ENDOSCOPY;  Service: Endoscopy;  Laterality: N/A;   HERNIA REPAIR  2014   hysterectemy     melonoma removal on R medial arch of foot  2008   TOTAL ABDOMINAL HYSTERECTOMY W/ BILATERAL SALPINGOOPHORECTOMY     UMBILICAL HERNIA REPAIR     occurred with hysterectemy    There were no vitals filed for this visit.   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[employer], house work , take care of kids and grandkids      AROM   Left Wrist Extension 60 Degrees   pain   Left Wrist Flexion 95 Degrees   pain   Left Wrist Radial Deviation 15 Degrees    Left Wrist Ulnar Deviation 32 Degrees   pain     Left Hand AROM   L Thumb Radial ADduction/ABduction 0-55 38   pain   L Thumb Palmar ADduction/ABduction 0-45 58   pain   L Thumb Opposition to Index --   pain with opposition to digits                     OT Treatments/Exercises (OP) - 01/01/21 0001       LUE Contrast Bath   Time 8 minutes    Comments decrease pain and swelling over radial /1st dorsal compartment                    OT Education - 01/01/21 1351     Education Details findings of eval and HEP     Person(s) Educated Patient    Methods Explanation;Demonstration;Tactile cues;Verbal cues;Handout    Comprehension Verbal cues required;Returned demonstration;Verbalized understanding              OT Short Term Goals - 01/01/21 1512       OT SHORT TERM GOAL #1   Title Pt to be independent in HEP and splint wearing to decrease pain on PRWHE with more than 25 points    Baseline pain on PRWHE is 48/50    Time 4    Period Weeks    Status New    Target Date 01/29/21               OT Long Term Goals - 01/01/21 1513       OT LONG TERM GOAL #1   Title Pain decrease in L wrist and thumb for pt to show thumb and wrist AROM WFL to wean out of splint more than 50% of time    Baseline pain in all planes for L wrist and thumb  - pain 8-10/10 - decrease thumb AROM , wrist WNL pain 10/10    Time 5    Period Weeks    Status New    Target Date 02/05/21      OT LONG TERM GOAL #2   Title L grip and prehension strength increase to more than 60% compare to R to wean out of splint and use hand in work 50% without splint    Baseline NT - pain at rest 8/10 and AROM for thumb and wrist  10/10 -    Time 6    Period Weeks    Status New    Target Date 02/12/21                   Plan - 01/01/21 1352     Clinical Impression Statement Pt present at OT eval with diagnosis of L DeQuervain/1st dorsal compartment tenosynovitis - pt symptoms since January/Febr - pain over distal radius 8-10/10 - pt with sweling over the radial head today and pain with thumb and wrist ROM in all planes- recommend for pt to  see DR Candelaria Stagers for diagnostic US and possible shot- pt want to try therapy ffirst- done contrast and fabricate custom thumb spica for L hand and wrist to wear most all the time - can do ice massage and AROM pain free 2-3 x day - pt pain with decrease ROM and strenght limting her functional use of L hand in ADL's and IADlL's - pt can benefit from skilled OT services to decrease pain with  increase ROM , strenght and functional use of L hand    OT Occupational Profile and History Problem Focused Assessment - Including review of records relating to presenting problem    Occupational performance deficits (Please refer to evaluation for details): ADL's;IADL's;Work;Rest and Sleep;Play;Leisure;Social Participation    Body Structure / Function / Physical Skills ADL;Flexibility;Sensation;IADL;ROM;UE functional use;Edema;Dexterity;Pain;Strength;Decreased knowledge of precautions    Rehab Potential Fair    Clinical Decision Making Limited treatment options, no task modification necessary    Comorbidities Affecting Occupational Performance: None    Modification or Assistance to Complete Evaluation  No modification of tasks or assist necessary to complete eval    OT Frequency 2x / week    OT Duration 6 weeks    OT Treatment/Interventions Self-care/ADL training;Cryotherapy;Moist Heat;Fluidtherapy;Contrast Bath;Therapeutic exercise;Manual Therapy;Patient/family education;Passive range of motion;DME and/or AE instruction;Splinting    Consulted and Agree with Plan of Care Patient             Patient will benefit from skilled therapeutic intervention in order to improve the following deficits and impairments:   Body Structure / Function / Physical Skills: ADL, Flexibility, Sensation, IADL, ROM, UE functional use, Edema, Dexterity, Pain, Strength, Decreased knowledge of precautions       Visit Diagnosis: Radial styloid tenosynovitis - Plan: Ot plan of care cert/re-cert  Pain in left wrist - Plan: Ot plan of care cert/re-cert  Stiffness of left hand, not elsewhere classified - Plan: Ot plan of care cert/re-cert  Stiffness of left wrist, not elsewhere classified - Plan: Ot plan of care cert/re-cert  Muscle weakness (generalized) - Plan: Ot plan of care cert/re-cert    Problem List Patient Active Problem List   Diagnosis Date Noted   Other dysphagia    Schatzki's ring    Acute  gastric erosion    Screen for colon cancer    Vagina itching 03/16/2020   Plantar fasciitis 09/06/2019   Close exposure to COVID-19 virus 02/28/2019   Spinal stenosis of lumbar region with neurogenic claudication 01/15/2019   Vaginal atrophy 06/24/2017   Multiple allergies 01/13/2017   Major depression, recurrent ([place]) 12/26/2016   Swelling of limb 12/12/2016   Varicose veins of leg with pain, bilateral 12/12/2016   Contact dermatitis 12/06/2014   Allergic rhinitis    History of skin cancer     Rosalyn Gess, OT/L 01/01/2021, 3:19 PM  [facility] [address], [place], [address] Phone: [phone]   Fax:  [phone]  Name: Karen Walker. MRN: [id number] Date of Birth: 1970/11/22

## 2021-01-03 ENCOUNTER — Encounter: Payer: 59 | Admitting: Family Medicine

## 2021-01-09 ENCOUNTER — Ambulatory Visit: Payer: 59 | Attending: Family Medicine | Admitting: Occupational Therapy

## 2021-01-09 ENCOUNTER — Ambulatory Visit (INDEPENDENT_AMBULATORY_CARE_PROVIDER_SITE_OTHER): Payer: 59 | Admitting: Family Medicine

## 2021-01-09 ENCOUNTER — Encounter: Payer: Self-pay | Admitting: Family Medicine

## 2021-01-09 ENCOUNTER — Other Ambulatory Visit: Payer: Self-pay

## 2021-01-09 VITALS — BP 97/66 | HR 88 | Temp 98.0°F | Ht 64.02 in | Wt 156.2 lb

## 2021-01-09 DIAGNOSIS — M6281 Muscle weakness (generalized): Secondary | ICD-10-CM | POA: Insufficient documentation

## 2021-01-09 DIAGNOSIS — M25632 Stiffness of left wrist, not elsewhere classified: Secondary | ICD-10-CM | POA: Insufficient documentation

## 2021-01-09 DIAGNOSIS — Z0000 Encounter for general adult medical examination without abnormal findings: Secondary | ICD-10-CM

## 2021-01-09 DIAGNOSIS — M25642 Stiffness of left hand, not elsewhere classified: Secondary | ICD-10-CM | POA: Insufficient documentation

## 2021-01-09 DIAGNOSIS — Z1231 Encounter for screening mammogram for malignant neoplasm of breast: Secondary | ICD-10-CM

## 2021-01-09 DIAGNOSIS — M25532 Pain in left wrist: Secondary | ICD-10-CM | POA: Insufficient documentation

## 2021-01-09 DIAGNOSIS — M654 Radial styloid tenosynovitis [de Quervain]: Secondary | ICD-10-CM | POA: Insufficient documentation

## 2021-01-09 LAB — URINALYSIS, ROUTINE W REFLEX MICROSCOPIC
Bilirubin, UA: NEGATIVE
Glucose, UA: NEGATIVE
Ketones, UA: NEGATIVE
Nitrite, UA: POSITIVE — AB
Protein,UA: NEGATIVE
Specific Gravity, UA: >1.03 — ABNORMAL HIGH (ref 1.005–1.030)
Urobilinogen, Ur: 0.2 mg/dL (ref 0.2–1.0)
pH, UA: 5 (ref 5.0–7.5)

## 2021-01-09 LAB — MICROSCOPIC EXAMINATION: Epithelial Cells (non renal): NONE SEEN /hpf (ref 0–10)

## 2021-01-09 MED ORDER — VALACYCLOVIR HCL 500 MG PO TABS
500.0000 mg | ORAL_TABLET | Freq: Two times a day (BID) | ORAL | 12 refills | Status: DC
  Filled 2021-01-09: qty 10, 5d supply, fill #0

## 2021-01-09 MED ORDER — LEVOCETIRIZINE DIHYDROCHLORIDE 5 MG PO TABS
5.0000 mg | ORAL_TABLET | Freq: Every evening | ORAL | 4 refills | Status: DC
  Filled 2021-01-09: qty 90, 90d supply, fill #0

## 2021-01-09 NOTE — Therapy (Signed)
[facility] [address], [place], [address] Phone: [phone]   Fax:  [phone]  Occupational Therapy Treatment  Patient Details  Name: Karen Walker. MRN: [id number] Date of Birth: 1970-04-14 Referring Provider (OT): DR Wynetta Emery   Encounter Date: 01/09/2021   OT End of Session - 01/09/21 2041     Visit Number 2    Number of Visits 10    Date for OT Re-Evaluation 02/12/21    OT Start Time 1351    OT Stop Time 1429    OT Time Calculation (min) 38 min    Activity Tolerance Patient tolerated treatment well    Behavior During Therapy [facility] for tasks assessed/performed             Past Medical History:  Diagnosis Date   Allergic rhinitis    Anxiety    Breast lump on right side at 8 o'clock position 12/06/2014   Depression    Dysplastic nevus 02/14/2020   Mod to severe - close to margin R foot inf to her med maleolus   History of skin cancer    Hx of migraine headaches    light sensivity, unilateral HA   Urticaria     Past Surgical History:  Procedure Laterality Date   ABDOMINAL HYSTERECTOMY  2014   BREAST CYST ASPIRATION Right 2013   negative. Done at Dr. Dwyane Luo office   COLONOSCOPY WITH PROPOFOL N/A 07/20/2020   Procedure: COLONOSCOPY WITH PROPOFOL;  Surgeon: Virgel Manifold, MD;  Location: [facility] ENDOSCOPY;  Service: Endoscopy;  Laterality: N/A;  SPANISH INTERPRETER   ESOPHAGOGASTRODUODENOSCOPY (EGD) WITH PROPOFOL N/A 07/20/2020   Procedure: ESOPHAGOGASTRODUODENOSCOPY (EGD) WITH PROPOFOL;  Surgeon: Virgel Manifold, MD;  Location: [facility] ENDOSCOPY;  Service: Endoscopy;  Laterality: N/A;   HERNIA REPAIR  2014   hysterectemy     melonoma removal on R medial arch of foot  2008   TOTAL ABDOMINAL HYSTERECTOMY W/ BILATERAL SALPINGOOPHORECTOMY     UMBILICAL HERNIA REPAIR     occurred with hysterectemy    There were no vitals filed for this visit.   Subjective Assessment - 01/09/21 2039      Subjective  I had my physical today and the Dr said you going to start the medication patch on my wrist    Pertinent History Was seen by PCP on 10/26/20 - xrays was negative, and then 11/06/20 cont  L  wrist pain and DeQuervain - Prednisone and Voltaren -but not improving -and follow up again with PCP 12/13/20 and refer to OT    Patient Stated Goals I want the pain and swelling better so I can use my L hand and wrist to do my job, take care of my house and kids/grand kids    Currently in Pain? Yes    Pain Score 8     Pain Location Wrist    Pain Descriptors / Indicators Aching;Tender;Throbbing    Pain Type Acute pain    Pain Onset More than a month ago    Pain Frequency Constant                 TEnderness over distal radius head and finkelstein 8/10 Pain with thumb RA more than PA  Opposition WFl Wrist flexion, ext, RD ,UD end range pain  Thumb spica doing well - and cont to wear most all the time  Pt report not sleeping with it  Soft tissue - edema decrease over 1st dorsal compartment but still  swollen - pt cannot come 2 x this week because of work schedule will start 2 x next week   Skin check done this date - prior and after ionto -tolerate well but had to decrease current from 1.9 to 1.7 to 1.6 - but no skin issues end          OT Treatments/Exercises (OP) - 01/09/21 0001       Iontophoresis   Type of Iontophoresis Dexamethasone    Location 1st dorsal compartment    Dose med patch , 1.8 to 1.6 decrease    Time 21                    OT Education - 01/09/21 2041     Education Details splint wearing and ionto use    Person(s) Educated Patient    Methods Explanation;Demonstration;Tactile cues;Verbal cues;Handout    Comprehension Verbal cues required;Returned demonstration;Verbalized understanding              OT Short Term Goals - 01/01/21 1512       OT SHORT TERM GOAL #1   Title Pt to be independent in HEP and splint wearing to decrease pain  on PRWHE with more than 25 points    Baseline pain on PRWHE is 48/50    Time 4    Period Weeks    Status New    Target Date 01/29/21               OT Long Term Goals - 01/01/21 1513       OT LONG TERM GOAL #1   Title Pain decrease in L wrist and thumb for pt to show thumb and wrist AROM WFL to wean out of splint more than 50% of time    Baseline pain in all planes for L wrist and thumb  - pain 8-10/10 - decrease thumb AROM , wrist WNL pain 10/10    Time 5    Period Weeks    Status New    Target Date 02/05/21      OT LONG TERM GOAL #2   Title L grip and prehension strength increase to more than 60% compare to R to wean out of splint and use hand in work 50% without splint    Baseline NT - pain at rest 8/10 and AROM for thumb and wrist  10/10 -    Time 6    Period Weeks    Status New    Target Date 02/12/21                   Plan - 01/09/21 2042     Clinical Impression Statement Pt present at OT eval with diagnosis of L DeQuervain/1st dorsal compartment tenosynovitis last week - pt symptoms since January/Febr - pain over distal radius 8-10/10 - pt with sweling over the radial head today and pain with thumb and wrist ROM in all planes- recommend for pt to see DR Candelaria Stagers for diagnostic US and possible shot- pt want to try therapy first- done contrast and fabricate custom thumb spica for L hand and wrist to wear most all the time last week - can do ice massage and AROM pain free 2-3 x day - pt arrive this date with thumb spica on and pain still 8/10 - but splint helps a lot per pt - initiated this date ionto with dexamethazone -  pt pain with decrease ROM and strenght limting her functional use of L hand in ADL's  and IADlL's - pt can benefit from skilled OT services to decrease pain with increase ROM , strenght and functional use of L hand    OT Occupational Profile and History Problem Focused Assessment - Including review of records relating to presenting problem     Occupational performance deficits (Please refer to evaluation for details): ADL's;IADL's;Work;Rest and Sleep;Play;Leisure;Social Participation    Body Structure / Function / Physical Skills ADL;Flexibility;Sensation;IADL;ROM;UE functional use;Edema;Dexterity;Pain;Strength;Decreased knowledge of precautions    Rehab Potential Fair    Clinical Decision Making Limited treatment options, no task modification necessary    Comorbidities Affecting Occupational Performance: None    Modification or Assistance to Complete Evaluation  No modification of tasks or assist necessary to complete eval    OT Frequency 2x / week    OT Duration 6 weeks    OT Treatment/Interventions Self-care/ADL training;Cryotherapy;Moist Heat;Fluidtherapy;Contrast Bath;Therapeutic exercise;Manual Therapy;Patient/family education;Passive range of motion;DME and/or AE instruction;Splinting    Consulted and Agree with Plan of Care Patient             Patient will benefit from skilled therapeutic intervention in order to improve the following deficits and impairments:   Body Structure / Function / Physical Skills: ADL, Flexibility, Sensation, IADL, ROM, UE functional use, Edema, Dexterity, Pain, Strength, Decreased knowledge of precautions       Visit Diagnosis: Radial styloid tenosynovitis  Pain in left wrist  Stiffness of left hand, not elsewhere classified  Stiffness of left wrist, not elsewhere classified  Muscle weakness (generalized)    Problem List Patient Active Problem List   Diagnosis Date Noted   Other dysphagia    Schatzki's ring    Screen for colon cancer    Plantar fasciitis 09/06/2019   Spinal stenosis of lumbar region with neurogenic claudication 01/15/2019   Vaginal atrophy 06/24/2017   Multiple allergies 01/13/2017   Major depression, recurrent ([place]) 12/26/2016   Varicose veins of leg with pain, bilateral 12/12/2016   Allergic rhinitis    History of skin cancer     Rosalyn Gess,  OTR/L,CLT 01/09/2021, 8:45 PM  [facility] [address] [place], [address] Phone: [phone]   Fax:  [phone]  Name: Karen Walker. MRN: [id number] Date of Birth: Mar 26, 1971

## 2021-01-09 NOTE — Patient Instructions (Signed)
Call to schedule your mammogram: [facility]  Address: [address], [place], [place] [address]  Phone: [phone]  

## 2021-01-09 NOTE — Progress Notes (Signed)
BP 97/66   Pulse 88   Temp 98 F (36.7 C) (Oral)   Ht 5' 4.02" (1.626 m)   Wt 156 lb 3.2 oz (70.9 kg)   SpO2 98%   BMI 26.80 kg/m    Subjective:    Patient ID: Karen Mess., female    DOB: November 23, 1970, 50 y.o.   MRN: [id number]  HPI: Karen Walker is a 50 y.o. female presenting on 01/09/2021 for comprehensive medical examination. Current medical complaints include:none  She currently lives with: husband and kids Menopausal Symptoms: no  Depression Screen done today and results listed below:  Depression screen [facility] 2/9 01/09/2021 10/26/2020 07/03/2020 03/16/2020 08/05/2019  Decreased Interest 0 - 0 0 3  Down, Depressed, Hopeless 0 0 0 0 3  PHQ - 2 Score 0 0 0 0 6  Altered sleeping 0 0 0 2 3  Tired, decreased energy 0 0 0 3 3  Change in appetite 0 0 0 0 3  Feeling bad or failure about yourself  0 0 0 2 2  Trouble concentrating 0 0 1 3 3   Moving slowly or fidgety/restless 0 0 0 0 1  Suicidal thoughts 0 0 0 0 0  PHQ-9 Score 0 0 1 10 21   Difficult doing work/chores Not difficult at all Not difficult at all Not difficult at all - Very difficult  Some recent data might be hidden    Past Medical History:  Past Medical History:  Diagnosis Date   Allergic rhinitis    Anxiety    Breast lump on right side at 8 o'clock position 12/06/2014   Depression    Dysplastic nevus 02/14/2020   Mod to severe - close to margin R foot inf to her med maleolus   History of skin cancer    Hx of migraine headaches    light sensivity, unilateral HA   Urticaria     Surgical History:  Past Surgical History:  Procedure Laterality Date   ABDOMINAL HYSTERECTOMY  2014   BREAST CYST ASPIRATION Right 2013   negative. Done at Dr. Dwyane Luo office   COLONOSCOPY WITH PROPOFOL N/A 07/20/2020   Procedure: COLONOSCOPY WITH PROPOFOL;  Surgeon: Virgel Manifold, MD;  Location: [facility] ENDOSCOPY;  Service: Endoscopy;  Laterality: N/A;  SPANISH INTERPRETER   ESOPHAGOGASTRODUODENOSCOPY (EGD) WITH PROPOFOL  N/A 07/20/2020   Procedure: ESOPHAGOGASTRODUODENOSCOPY (EGD) WITH PROPOFOL;  Surgeon: Virgel Manifold, MD;  Location: [facility] ENDOSCOPY;  Service: Endoscopy;  Laterality: N/A;   HERNIA REPAIR  2014   hysterectemy     melonoma removal on R medial arch of foot  2008   TOTAL ABDOMINAL HYSTERECTOMY W/ BILATERAL SALPINGOOPHORECTOMY     UMBILICAL HERNIA REPAIR     occurred with hysterectemy    Medications:  Current Outpatient Medications on File Prior to Visit  Medication Sig   [DISCONTINUED] albuterol (VENTOLIN HFA) 108 (90 Base) MCG/ACT inhaler Inhale 2 puffs into the lungs every 6 (six) hours as needed for wheezing or shortness of breath. (Patient not taking: Reported on 07/03/2020)   No current facility-administered medications on file prior to visit.    Allergies:  Allergies  Allergen Reactions   Codeine Other (See Comments)   Pollen Extract     Social History:  Social History   Socioeconomic History   Marital status: Married    Spouse name: Not on file   Number of children: Not on file   Years of education: Not on file   Highest education level: Not on file  Occupational  History   Not on file  Tobacco Use   Smoking status: Never   Smokeless tobacco: Never  Vaping Use   Vaping Use: Never used  Substance and Sexual Activity   Alcohol use: No   Drug use: No   Sexual activity: Yes    Birth control/protection: Surgical  Other Topics Concern   Not on file  Social History Narrative   Not on file   Social Determinants of Health   Financial Resource Strain: Not on file  Food Insecurity: Not on file  Transportation Needs: Not on file  Physical Activity: Not on file  Stress: Not on file  Social Connections: Not on file  Intimate Partner Violence: Not on file   Social History   Tobacco Use  Smoking Status Never  Smokeless Tobacco Never   Social History   Substance and Sexual Activity  Alcohol Use No    Family History:  Family History  Problem Relation  Age of Onset   Stroke Father    Asthma Daughter    Urticaria Son    Food Allergy Son        red dye, dairy   Allergic rhinitis Neg Hx    Angioedema Neg Hx    Eczema Neg Hx     Past medical history, surgical history, medications, allergies, family history and social history reviewed with patient today and changes made to appropriate areas of the chart.   Review of Systems  Constitutional: Negative.   HENT: Negative.    Eyes:  Positive for blurred vision. Negative for double vision, photophobia, pain, discharge and redness.  Respiratory: Negative.    Cardiovascular: Negative.   Gastrointestinal: Negative.   Genitourinary: Negative.   Musculoskeletal: Negative.        L wrist pain  Skin:  Positive for rash. Negative for itching.  Neurological: Negative.   Endo/Heme/Allergies:  Positive for environmental allergies. Negative for polydipsia. Does not bruise/bleed easily.  Psychiatric/Behavioral: Negative.    All other ROS negative except what is listed above and in the HPI.      Objective:    BP 97/66   Pulse 88   Temp 98 F (36.7 C) (Oral)   Ht 5' 4.02" (1.626 m)   Wt 156 lb 3.2 oz (70.9 kg)   SpO2 98%   BMI 26.80 kg/m   Wt Readings from Last 3 Encounters:  01/09/21 156 lb 3.2 oz (70.9 kg)  12/13/20 155 lb 9.6 oz (70.6 kg)  11/12/20 156 lb 9.6 oz (71 kg)    Physical Exam Vitals and nursing note reviewed.  Constitutional:      General: She is not in acute distress.    Appearance: Normal appearance. She is not ill-appearing, toxic-appearing or diaphoretic.  HENT:     Head: Normocephalic and atraumatic.     Right Ear: Tympanic membrane, ear canal and external ear normal. There is no impacted cerumen.     Left Ear: Tympanic membrane, ear canal and external ear normal. There is no impacted cerumen.     Nose: Nose normal. No congestion or rhinorrhea.     Mouth/Throat:     Mouth: Mucous membranes are moist.     Pharynx: Oropharynx is clear. No oropharyngeal exudate or  posterior oropharyngeal erythema.  Eyes:     General: No scleral icterus.       Right eye: No discharge.        Left eye: No discharge.     Extraocular Movements: Extraocular movements intact.  Conjunctiva/sclera: Conjunctivae normal.     Pupils: Pupils are equal, round, and reactive to light.  Neck:     Vascular: No carotid bruit.  Cardiovascular:     Rate and Rhythm: Normal rate and regular rhythm.     Pulses: Normal pulses.     Heart sounds: No murmur heard.   No friction rub. No gallop.  Pulmonary:     Effort: Pulmonary effort is normal. No respiratory distress.     Breath sounds: Normal breath sounds. No stridor. No wheezing, rhonchi or rales.  Chest:     Chest wall: No tenderness.  Abdominal:     General: Abdomen is flat. Bowel sounds are normal. There is no distension.     Palpations: Abdomen is soft. There is no mass.     Tenderness: There is no abdominal tenderness. There is no right CVA tenderness, left CVA tenderness, guarding or rebound.     Hernia: No hernia is present.  Genitourinary:    Comments: Breast and pelvic exams deferred with shared decision making Musculoskeletal:        General: No swelling, tenderness, deformity or signs of injury.     Cervical back: Normal range of motion and neck supple. No rigidity. No muscular tenderness.     Right lower leg: No edema.     Left lower leg: No edema.  Lymphadenopathy:     Cervical: No cervical adenopathy.  Skin:    General: Skin is warm and dry.     Capillary Refill: Capillary refill takes less than 2 seconds.     Coloration: Skin is not jaundiced or pale.     Findings: No bruising, erythema, lesion or rash.  Neurological:     General: No focal deficit present.     Mental Status: She is alert and oriented to person, place, and time. Mental status is at baseline.     Cranial Nerves: No cranial nerve deficit.     Sensory: No sensory deficit.     Motor: No weakness.     Coordination: Coordination normal.      Gait: Gait normal.     Deep Tendon Reflexes: Reflexes normal.  Psychiatric:        Mood and Affect: Mood normal.        Behavior: Behavior normal.        Thought Content: Thought content normal.        Judgment: Judgment normal.    Results for orders placed or performed in visit on 08/27/20  H. pylori breath test  Result Value Ref Range   H pylori Breath Test Positive (A) Negative      Assessment & Plan:   Problem List Items Addressed This Visit   None Visit Diagnoses     Routine general medical examination at a health care facility    -  Primary   Vaccines up to date. Screening labs checked today. Mammogram ordered. Colonoscopy up to date. Continue diet and exercise. Continue to monitor.    Relevant Orders   CBC with Differential/Platelet   Comprehensive metabolic panel   Lipid Panel w/o Chol/HDL Ratio   Urinalysis, Routine w reflex microscopic   TSH   Encounter for screening mammogram for malignant neoplasm of breast       Mammogram ordered today.   Relevant Orders   MM 3D SCREEN BREAST BILATERAL        Follow up plan: Return in about 6 months (around 07/10/2021), or physical.   LABORATORY TESTING:  - Pap smear:  not applicable  IMMUNIZATIONS:   - Tdap: Tetanus vaccination status reviewed: last tetanus booster within 10 years. - Influenza: Up to date - Pneumovax: Not applicable - Prevnar: Not applicable - COVID: Up to date - Shingrix vaccine: Up to date  SCREENING: -Mammogram: Ordered today  - Colonoscopy: Up to date  - Bone Density: Not applicable   PATIENT COUNSELING:   Advised to take 1 mg of folate supplement per day if capable of pregnancy.   Sexuality: Discussed sexually transmitted diseases, partner selection, use of condoms, avoidance of unintended pregnancy  and contraceptive alternatives.   Advised to avoid cigarette smoking.  I discussed with the patient that most people either abstain from alcohol or drink within safe limits (<=14/week and  <=4 drinks/occasion for males, <=7/weeks and <= 3 drinks/occasion for females) and that the risk for alcohol disorders and other health effects rises proportionally with the number of drinks per week and how often a drinker exceeds daily limits.  Discussed cessation/primary prevention of drug use and availability of treatment for abuse.   Diet: Encouraged to adjust caloric intake to maintain  or achieve ideal body weight, to reduce intake of dietary saturated fat and total fat, to limit sodium intake by avoiding high sodium foods and not adding table salt, and to maintain adequate dietary potassium and calcium preferably from fresh fruits, vegetables, and low-fat dairy products.    stressed the importance of regular exercise  Injury prevention: Discussed safety belts, safety helmets, smoke detector, smoking near bedding or upholstery.   Dental health: Discussed importance of regular tooth brushing, flossing, and dental visits.    NEXT PREVENTATIVE PHYSICAL DUE IN 1 YEAR. Return in about 6 months (around 07/10/2021), or physical.

## 2021-01-10 LAB — COMPREHENSIVE METABOLIC PANEL
ALT: 17 IU/L (ref 0–32)
AST: 18 IU/L (ref 0–40)
Albumin/Globulin Ratio: 1.8 (ref 1.2–2.2)
Albumin: 4.4 g/dL (ref 3.8–4.8)
Alkaline Phosphatase: 55 IU/L (ref 44–121)
BUN/Creatinine Ratio: 20 (ref 9–23)
BUN: 14 mg/dL (ref 6–24)
Bilirubin Total: 0.4 mg/dL (ref 0.0–1.2)
CO2: 23 mmol/L (ref 20–29)
Calcium: 9.4 mg/dL (ref 8.7–10.2)
Chloride: 104 mmol/L (ref 96–106)
Creatinine, Ser: 0.7 mg/dL (ref 0.57–1.00)
Globulin, Total: 2.5 g/dL (ref 1.5–4.5)
Glucose: 107 mg/dL — ABNORMAL HIGH (ref 70–99)
Potassium: 3.7 mmol/L (ref 3.5–5.2)
Sodium: 143 mmol/L (ref 134–144)
Total Protein: 6.9 g/dL (ref 6.0–8.5)
eGFR: 105 mL/min/{1.73_m2} (ref >59)

## 2021-01-10 LAB — CBC WITH DIFFERENTIAL/PLATELET
Basophils Absolute: 0 10*3/uL (ref 0.0–0.2)
Basos: 0 %
EOS (ABSOLUTE): 0.1 10*3/uL (ref 0.0–0.4)
Eos: 2 %
Hematocrit: 38.5 % (ref 34.0–46.6)
Hemoglobin: 12.7 g/dL (ref 11.1–15.9)
Immature Grans (Abs): 0 10*3/uL (ref 0.0–0.1)
Immature Granulocytes: 0 %
Lymphocytes Absolute: 1.5 10*3/uL (ref 0.7–3.1)
Lymphs: 34 %
MCH: 30.2 pg (ref 26.6–33.0)
MCHC: 33 g/dL (ref 31.5–35.7)
MCV: 91 fL (ref 79–97)
Monocytes Absolute: 0.2 10*3/uL (ref 0.1–0.9)
Monocytes: 5 %
Neutrophils Absolute: 2.5 10*3/uL (ref 1.4–7.0)
Neutrophils: 59 %
Platelets: 196 10*3/uL (ref 150–450)
RBC: 4.21 x10E6/uL (ref 3.77–5.28)
RDW: 12.6 % (ref 11.7–15.4)
WBC: 4.3 10*3/uL (ref 3.4–10.8)

## 2021-01-10 LAB — LIPID PANEL W/O CHOL/HDL RATIO
Cholesterol, Total: 199 mg/dL (ref 100–199)
HDL: 61 mg/dL (ref >39)
LDL Chol Calc (NIH): 120 mg/dL — ABNORMAL HIGH (ref 0–99)
Triglycerides: 102 mg/dL (ref 0–149)
VLDL Cholesterol Cal: 18 mg/dL (ref 5–40)

## 2021-01-10 LAB — TSH: TSH: 2.34 u[IU]/mL (ref 0.450–4.500)

## 2021-01-11 ENCOUNTER — Ambulatory Visit: Payer: 59 | Admitting: Occupational Therapy

## 2021-01-15 ENCOUNTER — Ambulatory Visit: Payer: 59 | Admitting: Occupational Therapy

## 2021-01-15 NOTE — Therapy (Signed)
[place] [facility] [address], [place], [address] Phone: [phone]   Fax:  [phone]  Occupational Therapy note:   Patient Details  Name: Karen Walker. MRN: [id number] Date of Birth: 1970-09-22 Referring Provider (OT): DR Wynetta Emery   Encounter Date: 01/15/2021   OT End of Session - 01/15/21 1230     Visit Number 2    Number of Visits 10    Date for OT Re-Evaluation 02/12/21    OT Start Time 1230    Activity Tolerance Patient tolerated treatment well    Behavior During Therapy [facility] for tasks assessed/performed             Past Medical History:  Diagnosis Date   Allergic rhinitis    Anxiety    Breast lump on right side at 8 o'clock position 12/06/2014   Depression    Dysplastic nevus 02/14/2020   Mod to severe - close to margin R foot inf to her med maleolus   History of skin cancer    Hx of migraine headaches    light sensivity, unilateral HA   Urticaria     Past Surgical History:  Procedure Laterality Date   ABDOMINAL HYSTERECTOMY  2014   BREAST CYST ASPIRATION Right 2013   negative. Done at Dr. Dwyane Luo office   COLONOSCOPY WITH PROPOFOL N/A 07/20/2020   Procedure: COLONOSCOPY WITH PROPOFOL;  Surgeon: Virgel Manifold, MD;  Location: [facility] ENDOSCOPY;  Service: Endoscopy;  Laterality: N/A;  SPANISH INTERPRETER   ESOPHAGOGASTRODUODENOSCOPY (EGD) WITH PROPOFOL N/A 07/20/2020   Procedure: ESOPHAGOGASTRODUODENOSCOPY (EGD) WITH PROPOFOL;  Surgeon: Virgel Manifold, MD;  Location: [facility] ENDOSCOPY;  Service: Endoscopy;  Laterality: N/A;   HERNIA REPAIR  2014   hysterectemy     melonoma removal on R medial arch of foot  2008   TOTAL ABDOMINAL HYSTERECTOMY W/ BILATERAL SALPINGOOPHORECTOMY     UMBILICAL HERNIA REPAIR     occurred with hysterectemy    There were no vitals filed for this visit.   Subjective Assessment - 01/15/21 1229     Pertinent History Was seen by PCP on 10/26/20 - xrays  was negative, and then 11/06/20 cont  L  wrist pain and DeQuervain - Prednisone and Voltaren -but not improving -and follow up again with PCP 12/13/20 and refer to OT    Patient Stated Goals I want the pain and swelling better so I can use my L hand and wrist to do my job, take care of my house and kids/grand kids                    NOTE OPEN ERROR - PT DID NOT SHOW             OT Education - 01/15/21 1230     Education Details splint wearing and ionto use    Person(s) Educated Patient    Methods Explanation;Demonstration;Tactile cues;Verbal cues;Handout    Comprehension Verbal cues required;Returned demonstration;Verbalized understanding              OT Short Term Goals - 01/01/21 1512       OT SHORT TERM GOAL #1   Title Pt to be independent in HEP and splint wearing to decrease pain on PRWHE with more than 25 points    Baseline pain on PRWHE is 48/50    Time 4    Period Weeks    Status New    Target Date 01/29/21  OT Long Term Goals - 01/01/21 1513       OT LONG TERM GOAL #1   Title Pain decrease in L wrist and thumb for pt to show thumb and wrist AROM WFL to wean out of splint more than 50% of time    Baseline pain in all planes for L wrist and thumb  - pain 8-10/10 - decrease thumb AROM , wrist WNL pain 10/10    Time 5    Period Weeks    Status New    Target Date 02/05/21      OT LONG TERM GOAL #2   Title L grip and prehension strength increase to more than 60% compare to R to wean out of splint and use hand in work 50% without splint    Baseline NT - pain at rest 8/10 and AROM for thumb and wrist  10/10 -    Time 6    Period Weeks    Status New    Target Date 02/12/21                   Plan - 01/15/21 1230     OT Occupational Profile and History Problem Focused Assessment - Including review of records relating to presenting problem    Occupational performance deficits (Please refer to evaluation for details):  ADL's;IADL's;Work;Rest and Sleep;Play;Leisure;Social Participation    Body Structure / Function / Physical Skills ADL;Flexibility;Sensation;IADL;ROM;UE functional use;Edema;Dexterity;Pain;Strength;Decreased knowledge of precautions    Rehab Potential Fair    Clinical Decision Making Limited treatment options, no task modification necessary    Comorbidities Affecting Occupational Performance: None    Modification or Assistance to Complete Evaluation  No modification of tasks or assist necessary to complete eval    OT Frequency 2x / week    OT Duration 6 weeks    OT Treatment/Interventions Self-care/ADL training;Cryotherapy;Moist Heat;Fluidtherapy;Contrast Bath;Therapeutic exercise;Manual Therapy;Patient/family education;Passive range of motion;DME and/or AE instruction;Splinting    Consulted and Agree with Plan of Care Patient             Patient will benefit from skilled therapeutic intervention in order to improve the following deficits and impairments:   Body Structure / Function / Physical Skills: ADL, Flexibility, Sensation, IADL, ROM, UE functional use, Edema, Dexterity, Pain, Strength, Decreased knowledge of precautions       Visit Diagnosis: Pain in left wrist  Stiffness of left wrist, not elsewhere classified  Muscle weakness (generalized)  Stiffness of left hand, not elsewhere classified  Radial styloid tenosynovitis    Problem List Patient Active Problem List   Diagnosis Date Noted   Other dysphagia    Schatzki's ring    Screen for colon cancer    Plantar fasciitis 09/06/2019   Spinal stenosis of lumbar region with neurogenic claudication 01/15/2019   Vaginal atrophy 06/24/2017   Multiple allergies 01/13/2017   Major depression, recurrent ([place]) 12/26/2016   Varicose veins of leg with pain, bilateral 12/12/2016   Allergic rhinitis    History of skin cancer     Rosalyn Gess, OTR/L, CLT  01/15/2021, 4:58 PM  [place] [facility] [address] [place], [address] Phone: [phone]   Fax:  [phone]  Name: Karen Walker. MRN: [id number] Date of Birth: Dec 14, 1970

## 2021-01-17 ENCOUNTER — Ambulatory Visit: Payer: 59 | Admitting: Occupational Therapy

## 2021-01-17 DIAGNOSIS — M25632 Stiffness of left wrist, not elsewhere classified: Secondary | ICD-10-CM

## 2021-01-17 DIAGNOSIS — M25532 Pain in left wrist: Secondary | ICD-10-CM | POA: Diagnosis not present

## 2021-01-17 DIAGNOSIS — M654 Radial styloid tenosynovitis [de Quervain]: Secondary | ICD-10-CM

## 2021-01-17 DIAGNOSIS — M25642 Stiffness of left hand, not elsewhere classified: Secondary | ICD-10-CM | POA: Diagnosis not present

## 2021-01-17 DIAGNOSIS — M6281 Muscle weakness (generalized): Secondary | ICD-10-CM | POA: Diagnosis not present

## 2021-01-17 NOTE — Therapy (Signed)
[place] [facility] [address], [place], [address] Phone: [phone]   Fax:  [phone]  Occupational Therapy Treatment  Patient Details  Name: Karen Walker. MRN: [id number] Date of Birth: 11/28/1970 Referring Provider (OT): DR Wynetta Emery   Encounter Date: 01/17/2021   OT End of Session - 01/17/21 1252     Visit Number 3    Number of Visits 10    Date for OT Re-Evaluation 02/12/21    OT Start Time 9242    OT Stop Time 1332    OT Time Calculation (min) 57 min    Activity Tolerance Patient tolerated treatment well    Behavior During Therapy [facility] for tasks assessed/performed             Past Medical History:  Diagnosis Date   Allergic rhinitis    Anxiety    Breast lump on right side at 8 o'clock position 12/06/2014   Depression    Dysplastic nevus 02/14/2020   Mod to severe - close to margin R foot inf to her med maleolus   History of skin cancer    Hx of migraine headaches    light sensivity, unilateral HA   Urticaria     Past Surgical History:  Procedure Laterality Date   ABDOMINAL HYSTERECTOMY  2014   BREAST CYST ASPIRATION Right 2013   negative. Done at Dr. Dwyane Luo office   COLONOSCOPY WITH PROPOFOL N/A 07/20/2020   Procedure: COLONOSCOPY WITH PROPOFOL;  Surgeon: Virgel Manifold, MD;  Location: [facility] ENDOSCOPY;  Service: Endoscopy;  Laterality: N/A;  SPANISH INTERPRETER   ESOPHAGOGASTRODUODENOSCOPY (EGD) WITH PROPOFOL N/A 07/20/2020   Procedure: ESOPHAGOGASTRODUODENOSCOPY (EGD) WITH PROPOFOL;  Surgeon: Virgel Manifold, MD;  Location: [facility] ENDOSCOPY;  Service: Endoscopy;  Laterality: N/A;   HERNIA REPAIR  2014   hysterectemy     melonoma removal on R medial arch of foot  2008   TOTAL ABDOMINAL HYSTERECTOMY W/ BILATERAL SALPINGOOPHORECTOMY     UMBILICAL HERNIA REPAIR     occurred with hysterectemy    There were no vitals filed for this visit.   Subjective Assessment - 01/17/21  1250     Subjective  LIttle better- last night was hurting more- depends what I had to do the day at work    Pertinent History Was seen by PCP on 10/26/20 - xrays was negative, and then 11/06/20 cont  L  wrist pain and DeQuervain - Prednisone and Voltaren -but not improving -and follow up again with PCP 12/13/20 and refer to OT    Patient Stated Goals I want the pain and swelling better so I can use my L hand and wrist to do my job, take care of my house and kids/grand kids    Currently in Pain? Yes    Pain Score 4     Pain Location Wrist    Pain Orientation Left    Pain Descriptors / Indicators Aching;Tightness;Tender    Pain Type Acute pain    Pain Onset More than a month ago    Pain Frequency Constant                          OT Treatments/Exercises (OP) - 01/17/21 0001       Iontophoresis   Type of Iontophoresis Dexamethasone    Location 1st dorsal compartment    Dose 1.3 current increase 1.5    Time 28      LUE Contrast  Bath   Time 8 minutes    Comments prior to soft tissue and ROM               TEnderness over distal radius head and finkelstein 8/10 Pain with thumb RA more than PA  Opposition WFL Wrist flexion, ext, RD ,UD end range pain  Thumb spica doing well - and cont to wear most all the time  Pt report not sleeping with it  Soft tissue - edema decrease over 1st dorsal compartment but still swollen - DId not come 2 x wk this date because of work schedule - recommend for ionto 2 x wk   Skin check done this date - prior and after ionto -tolerate well but had to decrease current from 1.3 to  1.5 - no skin issues end        OT Education - 01/17/21 1251     Education Details splint wearing and ionto use    Person(s) Educated Patient    Methods Explanation;Demonstration;Tactile cues;Verbal cues;Handout    Comprehension Verbal cues required;Returned demonstration;Verbalized understanding              OT Short Term Goals - 01/01/21 1512        OT SHORT TERM GOAL #1   Title Pt to be independent in HEP and splint wearing to decrease pain on PRWHE with more than 25 points    Baseline pain on PRWHE is 48/50    Time 4    Period Weeks    Status New    Target Date 01/29/21               OT Long Term Goals - 01/01/21 1513       OT LONG TERM GOAL #1   Title Pain decrease in L wrist and thumb for pt to show thumb and wrist AROM WFL to wean out of splint more than 50% of time    Baseline pain in all planes for L wrist and thumb  - pain 8-10/10 - decrease thumb AROM , wrist WNL pain 10/10    Time 5    Period Weeks    Status New    Target Date 02/05/21      OT LONG TERM GOAL #2   Title L grip and prehension strength increase to more than 60% compare to R to wean out of splint and use hand in work 50% without splint    Baseline NT - pain at rest 8/10 and AROM for thumb and wrist  10/10 -    Time 6    Period Weeks    Status New    Target Date 02/12/21                   Plan - 01/17/21 1252     Clinical Impression Statement Pt present at OT eval with diagnosis of L DeQuervain/1st dorsal compartment tenosynovitis last week - pt symptoms since January/Febr - pain over distal radius 8-10/10 - pt with sweling over the radial head today and pain with thumb and wrist ROM in all planes- recommend for pt to see DR Candelaria Stagers for diagnostic US and possible shot- pt want to try therapy first- done contrast and fabricate custom thumb spica for L hand and wrist to wear most all the time last week - can do ice massage and AROM pain free 2-3 x day -  Pt seen about week ago and last time could not come because of work schedule - pt report  pain little better - to 4/10 but still tender and increase to 8/10 - initiated again ionto this date - and cont with splint and HEP -  pt cont with pain with decrease ROM and strenght limting her functional use of L hand in ADL's and IADlL's - pt can benefit from skilled OT services to decrease pain  with increase ROM , strenght and functional use of L hand    OT Occupational Profile and History Problem Focused Assessment - Including review of records relating to presenting problem    Occupational performance deficits (Please refer to evaluation for details): ADL's;IADL's;Work;Rest and Sleep;Play;Leisure;Social Participation    Body Structure / Function / Physical Skills ADL;Flexibility;Sensation;IADL;ROM;UE functional use;Edema;Dexterity;Pain;Strength;Decreased knowledge of precautions    Rehab Potential Fair    Clinical Decision Making Limited treatment options, no task modification necessary    Comorbidities Affecting Occupational Performance: None    Modification or Assistance to Complete Evaluation  No modification of tasks or assist necessary to complete eval    OT Frequency 2x / week    OT Duration 6 weeks    OT Treatment/Interventions Self-care/ADL training;Cryotherapy;Moist Heat;Fluidtherapy;Contrast Bath;Therapeutic exercise;Manual Therapy;Patient/family education;Passive range of motion;DME and/or AE instruction;Splinting    Consulted and Agree with Plan of Care Patient             Patient will benefit from skilled therapeutic intervention in order to improve the following deficits and impairments:   Body Structure / Function / Physical Skills: ADL, Flexibility, Sensation, IADL, ROM, UE functional use, Edema, Dexterity, Pain, Strength, Decreased knowledge of precautions       Visit Diagnosis: Stiffness of left wrist, not elsewhere classified  Muscle weakness (generalized)  Stiffness of left hand, not elsewhere classified  Radial styloid tenosynovitis  Pain in left wrist    Problem List Patient Active Problem List   Diagnosis Date Noted   Other dysphagia    Schatzki's ring    Screen for colon cancer    Plantar fasciitis 09/06/2019   Spinal stenosis of lumbar region with neurogenic claudication 01/15/2019   Vaginal atrophy 06/24/2017   Multiple allergies  01/13/2017   Major depression, recurrent ([place]) 12/26/2016   Varicose veins of leg with pain, bilateral 12/12/2016   Allergic rhinitis    History of skin cancer     Rosalyn Gess, OTR/L,CLT 01/17/2021, 1:29 PM  [place] [facility] [address] [place], [address] Phone: [phone]   Fax:  [phone]  Name: Karen Walker. MRN: [id number] Date of Birth: September 15, 1970

## 2021-01-21 ENCOUNTER — Other Ambulatory Visit: Payer: Self-pay

## 2021-01-21 ENCOUNTER — Ambulatory Visit: Payer: 59 | Admitting: Occupational Therapy

## 2021-01-21 DIAGNOSIS — M6281 Muscle weakness (generalized): Secondary | ICD-10-CM | POA: Diagnosis not present

## 2021-01-21 DIAGNOSIS — M25532 Pain in left wrist: Secondary | ICD-10-CM

## 2021-01-21 DIAGNOSIS — M654 Radial styloid tenosynovitis [de Quervain]: Secondary | ICD-10-CM

## 2021-01-21 DIAGNOSIS — M25632 Stiffness of left wrist, not elsewhere classified: Secondary | ICD-10-CM

## 2021-01-21 DIAGNOSIS — M25642 Stiffness of left hand, not elsewhere classified: Secondary | ICD-10-CM

## 2021-01-21 NOTE — Therapy (Signed)
[place] [facility] [address] [place], [address] Phone: [phone]   Fax:  [phone]  Occupational Therapy Treatment  Patient Details  Name: Karen Walker. MRN: [id number] Date of Birth: Jan 17, 1971 Referring Provider (OT): DR Wynetta Emery   Encounter Date: 01/21/2021   OT End of Session - 01/21/21 1257     Visit Number 4    Number of Visits 10    Date for OT Re-Evaluation 02/12/21    OT Start Time 3235    OT Stop Time 1310    OT Time Calculation (min) 35 min    Activity Tolerance Patient tolerated treatment well    Behavior During Therapy [facility] for tasks assessed/performed             Past Medical History:  Diagnosis Date   Allergic rhinitis    Anxiety    Breast lump on right side at 8 o'clock position 12/06/2014   Depression    Dysplastic nevus 02/14/2020   Mod to severe - close to margin R foot inf to her med maleolus   History of skin cancer    Hx of migraine headaches    light sensivity, unilateral HA   Urticaria     Past Surgical History:  Procedure Laterality Date   ABDOMINAL HYSTERECTOMY  2014   BREAST CYST ASPIRATION Right 2013   negative. Done at Dr. Dwyane Luo office   COLONOSCOPY WITH PROPOFOL N/A 07/20/2020   Procedure: COLONOSCOPY WITH PROPOFOL;  Surgeon: Virgel Manifold, MD;  Location: [facility] ENDOSCOPY;  Service: Endoscopy;  Laterality: N/A;  SPANISH INTERPRETER   ESOPHAGOGASTRODUODENOSCOPY (EGD) WITH PROPOFOL N/A 07/20/2020   Procedure: ESOPHAGOGASTRODUODENOSCOPY (EGD) WITH PROPOFOL;  Surgeon: Virgel Manifold, MD;  Location: [facility] ENDOSCOPY;  Service: Endoscopy;  Laterality: N/A;   HERNIA REPAIR  2014   hysterectemy     melonoma removal on R medial arch of foot  2008   TOTAL ABDOMINAL HYSTERECTOMY W/ BILATERAL SALPINGOOPHORECTOMY     UMBILICAL HERNIA REPAIR     occurred with hysterectemy    There were no vitals filed for this visit.   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 Yes    Pain Score 4     Pain Location Wrist    Pain Orientation Left    Pain Descriptors / Indicators Aching;Tightness    Pain Type Acute pain    Pain Onset More than a month ago    Pain Frequency Constant                   TEnderness over distal radius head and finkelstein 8/10 Pain with thumb RA more than PA  Opposition WFL Wrist flexion, ext, RD ,UD end range pain  Thumb spica doing well - and cont to wear most all the time  Pt report  not sleeping with it  Soft tissue - edema decrease over 1st dorsal compartment but still swollen -  recommend for ionto 2 x wk   Skin check done this date - prior  ionto -tolerate  better than last time - pt to keep patch on for hour afterwards          OT Treatments/Exercises (OP) - 01/21/21 0001       Iontophoresis   Type of Iontophoresis Dexamethasone    Location 1st dorsal compartment    Dose 1.6 current . med patch    Time 23                    OT Education - 01/21/21 1257     Education Details splint wearing and ionto use    Person(s) Educated Patient    Methods Explanation;Demonstration;Tactile cues;Verbal cues;Handout    Comprehension Verbal cues required;Returned demonstration;Verbalized understanding              OT Short Term Goals - 01/01/21 1512       OT SHORT TERM GOAL #1   Title Pt to be independent in HEP and splint wearing to decrease pain on PRWHE  with more than 25 points    Baseline pain on PRWHE is 48/50    Time 4    Period Weeks    Status New    Target Date 01/29/21               OT Long Term Goals - 01/01/21 1513       OT LONG TERM GOAL #1   Title Pain decrease in L wrist and thumb for pt to show thumb and wrist AROM WFL to wean out of splint more than 50% of time    Baseline pain in all planes for L wrist and thumb  - pain 8-10/10 - decrease thumb AROM , wrist WNL pain 10/10    Time 5    Period Weeks    Status New    Target Date 02/05/21      OT LONG TERM GOAL #2   Title L grip and prehension strength increase to more than 60% compare to R to wean out of splint and use hand in work 50% without splint    Baseline NT - pain at rest 8/10 and AROM for thumb and wrist  10/10 -    Time 6    Period Weeks    Status New    Target Date 02/12/21                   Plan - 01/21/21 1257     Clinical Impression Statement Pt present at OT eval with diagnosis of L DeQuervain/1st dorsal compartment tenosynovitis  - pt symptoms since January/Febr - pain over distal radius 8-10/10 - pt with sweling over the radial head today and pain with thumb and wrist ROM in all planes- recommend for pt to see DR Candelaria Stagers for diagnostic US and possible shot- pt want to try therapy first-  since eval  contrast and fabricate custom thumb spica for L hand and wrist to wear most all the time  - can do ice massage and AROM pain free 2-3 x day -  pt report pain little better - to 4-6/10 but still tender  and pain with thumb RA  - 2nd session of ionto consecatively  - and cont with splint and HEP -  pt cont with pain with decrease ROM and strenght limting  her functional use of L hand in ADL's and IADlL's - pt can benefit from skilled OT services to decrease pain with increase ROM , strenght and functional use of L hand    OT Occupational Profile and History Problem Focused Assessment - Including review of records relating to presenting problem     Occupational performance deficits (Please refer to evaluation for details): ADL's;IADL's;Work;Rest and Sleep;Play;Leisure;Social Participation    Body Structure / Function / Physical Skills ADL;Flexibility;Sensation;IADL;ROM;UE functional use;Edema;Dexterity;Pain;Strength;Decreased knowledge of precautions    Clinical Decision Making Limited treatment options, no task modification necessary    Comorbidities Affecting Occupational Performance: None    Modification or Assistance to Complete Evaluation  No modification of tasks or assist necessary to complete eval    OT Frequency 2x / week    OT Duration 4 weeks    OT Treatment/Interventions Self-care/ADL training;Cryotherapy;Moist Heat;Fluidtherapy;Contrast Bath;Therapeutic exercise;Manual Therapy;Patient/family education;Passive range of motion;DME and/or AE instruction;Splinting    Consulted and Agree with Plan of Care Patient             Patient will benefit from skilled therapeutic intervention in order to improve the following deficits and impairments:   Body Structure / Function / Physical Skills: ADL, Flexibility, Sensation, IADL, ROM, UE functional use, Edema, Dexterity, Pain, Strength, Decreased knowledge of precautions       Visit Diagnosis: Stiffness of left wrist, not elsewhere classified  Stiffness of left hand, not elsewhere classified  Radial styloid tenosynovitis  Pain in left wrist  Muscle weakness (generalized)    Problem List Patient Active Problem List   Diagnosis Date Noted   Other dysphagia    Schatzki's ring    Screen for colon cancer    Plantar fasciitis 09/06/2019   Spinal stenosis of lumbar region with neurogenic claudication 01/15/2019   Vaginal atrophy 06/24/2017   Multiple allergies 01/13/2017   Major depression, recurrent ([place]) 12/26/2016   Varicose veins of leg with pain, bilateral 12/12/2016   Allergic rhinitis    History of skin cancer     Rosalyn Gess, OTR/L,CLT 01/21/2021, 1:01  PM   [place] [facility] [address], [place], [address] Phone: [phone]   Fax:  [phone]  Name: Charlie Char. MRN: [id number] Date of Birth: 02-Jul-1970

## 2021-01-24 ENCOUNTER — Ambulatory Visit: Payer: 59 | Admitting: Occupational Therapy

## 2021-01-24 DIAGNOSIS — M654 Radial styloid tenosynovitis [de Quervain]: Secondary | ICD-10-CM

## 2021-01-24 DIAGNOSIS — M25632 Stiffness of left wrist, not elsewhere classified: Secondary | ICD-10-CM

## 2021-01-24 DIAGNOSIS — M25642 Stiffness of left hand, not elsewhere classified: Secondary | ICD-10-CM

## 2021-01-24 DIAGNOSIS — M25532 Pain in left wrist: Secondary | ICD-10-CM

## 2021-01-24 DIAGNOSIS — M6281 Muscle weakness (generalized): Secondary | ICD-10-CM

## 2021-01-24 NOTE — Therapy (Signed)
[place] [facility] [address], [place], [address] Phone: [phone]   Fax:  [phone]  Occupational Therapy Treatment  Patient Details  Name: Karen Walker. MRN: [id number] Date of Birth: 03/07/1971 Referring Provider (OT): DR Wynetta Emery   Encounter Date: 01/24/2021   OT End of Session - 01/24/21 1310     Visit Number 5    Number of Visits 10    Date for OT Re-Evaluation 02/12/21    OT Start Time 8185    OT Stop Time 1330    OT Time Calculation (min) 55 min    Activity Tolerance Patient tolerated treatment well    Behavior During Therapy [facility] for tasks assessed/performed             Past Medical History:  Diagnosis Date   Allergic rhinitis    Anxiety    Breast lump on right side at 8 o'clock position 12/06/2014   Depression    Dysplastic nevus 02/14/2020   Mod to severe - close to margin R foot inf to her med maleolus   History of skin cancer    Hx of migraine headaches    light sensivity, unilateral HA   Urticaria     Past Surgical History:  Procedure Laterality Date   ABDOMINAL HYSTERECTOMY  2014   BREAST CYST ASPIRATION Right 2013   negative. Done at Dr. Dwyane Luo office   COLONOSCOPY WITH PROPOFOL N/A 07/20/2020   Procedure: COLONOSCOPY WITH PROPOFOL;  Surgeon: Virgel Manifold, MD;  Location: [facility] ENDOSCOPY;  Service: Endoscopy;  Laterality: N/A;  SPANISH INTERPRETER   ESOPHAGOGASTRODUODENOSCOPY (EGD) WITH PROPOFOL N/A 07/20/2020   Procedure: ESOPHAGOGASTRODUODENOSCOPY (EGD) WITH PROPOFOL;  Surgeon: Virgel Manifold, MD;  Location: [facility] ENDOSCOPY;  Service: Endoscopy;  Laterality: N/A;   HERNIA REPAIR  2014   hysterectemy     melonoma removal on R medial arch of foot  2008   TOTAL ABDOMINAL HYSTERECTOMY W/ BILATERAL SALPINGOOPHORECTOMY     UMBILICAL HERNIA REPAIR     occurred with hysterectemy    There were no vitals filed for this visit.   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 Yes    Pain Score 9     Pain Location Wrist    Pain Orientation Left    Pain Descriptors / Indicators Aching;Tightness    Pain Onset More than a month ago    Pain Frequency Constant                       Skin check done this date - prior  ionto  - more sensitive today - lower current - pt to keep patch on for hour afterwards    OT Treatments/Exercises (OP) - 01/24/21 0001  Iontophoresis   Type of Iontophoresis Dexamethasone    Location 1st dorsal compartment    Dose 1. current . med patch    Time 38 - but cut shorted because of time to pick up kids     LUE Contrast Bath   Time 8 minutes             TEnderness over distal radius head and finkelstein 9/10 today after using it a lot in the kitchen the last 2 days at work  Pain with thumb RA more than PA  Opposition [facility] Wrist flexion, ext, RD ,UD end range pain  Thumb spica doing well - and cont to wear most all the time  Pt report not sleeping with it  Soft tissue done over volar and dorsal wrist and hand - MC spreads and CT spreads  - edema decrease over 1st dorsal compartment but still swollen -  recommend for ionto 2 x wk               OT Short Term Goals - 01/01/21 1512       OT SHORT TERM GOAL #1   Title Pt to be independent in HEP and splint wearing to decrease pain on PRWHE with more than 25 points    Baseline pain on PRWHE is 48/50    Time  4    Period Weeks    Status New    Target Date 01/29/21               OT Long Term Goals - 01/01/21 1513       OT LONG TERM GOAL #1   Title Pain decrease in L wrist and thumb for pt to show thumb and wrist AROM WFL to wean out of splint more than 50% of time    Baseline pain in all planes for L wrist and thumb  - pain 8-10/10 - decrease thumb AROM , wrist WNL pain 10/10    Time 5    Period Weeks    Status New    Target Date 02/05/21      OT LONG TERM GOAL #2   Title L grip and prehension strength increase to more than 60% compare to R to wean out of splint and use hand in work 50% without splint    Baseline NT - pain at rest 8/10 and AROM for thumb and wrist  10/10 -    Time 6    Period Weeks    Status New    Target Date 02/12/21                   Plan - 01/24/21 1310     Clinical Impression Statement Pt present at OT eval with diagnosis of L DeQuervain/1st dorsal compartment tenosynovitis  - pt symptoms since January/Febr - pain over distal radius 8-10/10 at eval - pt with sweling over the radial head today and pain with thumb and wrist ROM in all planes- recommend for pt to see DR Candelaria Stagers for diagnostic US and possible shot- pt want to try therapy first-  since eval  contrast and fabricate custom thumb spica for L hand and wrist to wear most all the time  - can do ice massage and AROM pain free 2-3 x day -  Up to 2 days ago pain was better - to 4-6/10 but still tender  and pain with thumb RA  - but this date increase to 9/10 because she was doing more in the kitchen  this past 2 days - today she had her 3rd session of ionto consecatively - little more tender today - pt cont with splint and HEP -  pt cont with pain with decrease ROM and strenght limting her functional use of L hand in ADL's and IADlL's - pt can benefit from skilled OT services to decrease pain with increase ROM , strenght and functional use of L hand    OT Occupational Profile and History Problem Focused  Assessment - Including review of records relating to presenting problem    Occupational performance deficits (Please refer to evaluation for details): ADL's;IADL's;Work;Rest and Sleep;Play;Leisure;Social Participation    Body Structure / Function / Physical Skills ADL;Flexibility;Sensation;IADL;ROM;UE functional use;Edema;Dexterity;Pain;Strength;Decreased knowledge of precautions    Rehab Potential Fair    Clinical Decision Making Limited treatment options, no task modification necessary    Comorbidities Affecting Occupational Performance: None    Modification or Assistance to Complete Evaluation  No modification of tasks or assist necessary to complete eval    OT Frequency 2x / week    OT Duration 4 weeks    OT Treatment/Interventions Self-care/ADL training;Cryotherapy;Moist Heat;Fluidtherapy;Contrast Bath;Therapeutic exercise;Manual Therapy;Patient/family education;Passive range of motion;DME and/or AE instruction;Splinting    Consulted and Agree with Plan of Care Patient             Patient will benefit from skilled therapeutic intervention in order to improve the following deficits and impairments:   Body Structure / Function / Physical Skills: ADL, Flexibility, Sensation, IADL, ROM, UE functional use, Edema, Dexterity, Pain, Strength, Decreased knowledge of precautions       Visit Diagnosis: Stiffness of left wrist, not elsewhere classified  Stiffness of left hand, not elsewhere classified  Radial styloid tenosynovitis  Pain in left wrist  Muscle weakness (generalized)    Problem List Patient Active Problem List   Diagnosis Date Noted   Other dysphagia    Schatzki's ring    Screen for colon cancer    Plantar fasciitis 09/06/2019   Spinal stenosis of lumbar region with neurogenic claudication 01/15/2019   Vaginal atrophy 06/24/2017   Multiple allergies 01/13/2017   Major depression, recurrent ([place]) 12/26/2016   Varicose veins of leg with pain, bilateral 12/12/2016    Allergic rhinitis    History of skin cancer     Rosalyn Gess, OTR/L,CLT 01/24/2021, 1:13 PM  [place] [facility] [address], [place], [address] Phone: [phone]   Fax:  [phone]  Name: Karen Walker. MRN: [id number] Date of Birth: [birth year]

## 2021-01-29 ENCOUNTER — Ambulatory Visit: Payer: 59 | Admitting: Occupational Therapy

## 2021-01-29 DIAGNOSIS — M25532 Pain in left wrist: Secondary | ICD-10-CM

## 2021-01-29 DIAGNOSIS — M6281 Muscle weakness (generalized): Secondary | ICD-10-CM

## 2021-01-29 DIAGNOSIS — M25632 Stiffness of left wrist, not elsewhere classified: Secondary | ICD-10-CM | POA: Diagnosis not present

## 2021-01-29 DIAGNOSIS — M654 Radial styloid tenosynovitis [de Quervain]: Secondary | ICD-10-CM | POA: Diagnosis not present

## 2021-01-29 DIAGNOSIS — M25642 Stiffness of left hand, not elsewhere classified: Secondary | ICD-10-CM | POA: Diagnosis not present

## 2021-01-29 NOTE — Therapy (Signed)
[place] [facility] [address], [place], [address] Phone: [phone]   Fax:  [phone]  Occupational Therapy Treatment  Patient Details  Name: Karen Walker. MRN: [id number] Date of Birth: [birth year] Referring Provider (OT): DR Wynetta Emery   Encounter Date: 01/29/2021   OT End of Session - 01/29/21 1309     Visit Number 6    Number of Visits 10    Date for OT Re-Evaluation 02/12/21    OT Start Time 1225    OT Stop Time 1325    OT Time Calculation (min) 60 min    Activity Tolerance Patient tolerated treatment well    Behavior During Therapy [facility] for tasks assessed/performed             Past Medical History:  Diagnosis Date   Allergic rhinitis    Anxiety    Breast lump on right side at 8 o'clock position 12/06/2014   Depression    Dysplastic nevus 02/14/2020   Mod to severe - close to margin R foot inf to her med maleolus   History of skin cancer    Hx of migraine headaches    light sensivity, unilateral HA   Urticaria     Past Surgical History:  Procedure Laterality Date   ABDOMINAL HYSTERECTOMY  2014   BREAST CYST ASPIRATION Right 2013   negative. Done at Dr. Dwyane Luo office   COLONOSCOPY WITH PROPOFOL N/A 07/20/2020   Procedure: COLONOSCOPY WITH PROPOFOL;  Surgeon: Virgel Manifold, MD;  Location: [facility] ENDOSCOPY;  Service: Endoscopy;  Laterality: N/A;  SPANISH INTERPRETER   ESOPHAGOGASTRODUODENOSCOPY (EGD) WITH PROPOFOL N/A 07/20/2020   Procedure: ESOPHAGOGASTRODUODENOSCOPY (EGD) WITH PROPOFOL;  Surgeon: Virgel Manifold, MD;  Location: [facility] ENDOSCOPY;  Service: Endoscopy;  Laterality: N/A;   HERNIA REPAIR  2014   hysterectemy     melonoma removal on R medial arch of foot  2008   TOTAL ABDOMINAL HYSTERECTOMY W/ BILATERAL SALPINGOOPHORECTOMY     UMBILICAL HERNIA REPAIR     occurred with hysterectemy    There were no vitals filed for this visit.   Subjective Assessment - 01/29/21  1308     Subjective  It is getting better- but I am using my hand so much at work - I was okay over the weekend - yesterday was worse -today pain about 5/10    Pertinent History Was seen by PCP on 10/26/20 - xrays was negative, and then 11/06/20 cont  L  wrist pain and DeQuervain - Prednisone and Voltaren -but not improving -and follow up again with PCP 12/13/20 and refer to OT    Patient Stated Goals I want the pain and swelling better so I can use my L hand and wrist to do my job, take care of my house and kids/grand kids    Currently in Pain? Yes    Pain Score 5     Pain Location Wrist    Pain Orientation Left    Pain Descriptors / Indicators Aching;Throbbing;Tender    Pain Type Acute pain    Pain Onset More than a month ago    Pain Frequency Constant                   TEnderness over distal radius head and finkelstein cont to be increase  Pain coming in 5/10 after using a lot at work - Fitted with prefab thumb  spica as another option to immobilize thumb and wrist at work  Using  it a lot in the kitchen t and preforming her job Pain with thumb RA more than PA  Opposition WFL Wrist flexion, ext, RD ,UD end range pain   Pt report not sleeping with it  Soft tissue done over volar and dorsal wrist and hand - MC spreads and CT spreads  - edema decrease over 1st dorsal compartment but still swollen -  recommend for ionto 2 x wk           OT Treatments/Exercises (OP) - 01/29/21 0001       Iontophoresis   Type of Iontophoresis Dexamethasone    Location 1st dorsal compartment    Dose 2.0 current . med patch    Time 19      LUE Contrast Bath   Time 8 minutes    Comments prior to soft tissue - pain decrease to 2/10                    OT Education - 01/29/21 1309     Education Details splint wearing and ionto use    Person(s) Educated Patient    Methods Explanation;Demonstration;Tactile cues;Verbal cues;Handout    Comprehension Verbal cues required;Returned  demonstration;Verbalized understanding              OT Short Term Goals - 01/01/21 1512       OT SHORT TERM GOAL #1   Title Pt to be independent in HEP and splint wearing to decrease pain on PRWHE with more than 25 points    Baseline pain on PRWHE is 48/50    Time 4    Period Weeks    Status New    Target Date 01/29/21               OT Long Term Goals - 01/01/21 1513       OT LONG TERM GOAL #1   Title Pain decrease in L wrist and thumb for pt to show thumb and wrist AROM WFL to wean out of splint more than 50% of time    Baseline pain in all planes for L wrist and thumb  - pain 8-10/10 - decrease thumb AROM , wrist WNL pain 10/10    Time 5    Period Weeks    Status New    Target Date 02/05/21      OT LONG TERM GOAL #2   Title L grip and prehension strength increase to more than 60% compare to R to wean out of splint and use hand in work 50% without splint    Baseline NT - pain at rest 8/10 and AROM for thumb and wrist  10/10 -    Time 6    Period Weeks    Status New    Target Date 02/12/21                   Plan - 01/29/21 1310     Clinical Impression Statement Pt present at OT eval with diagnosis of L DeQuervain/1st dorsal compartment tenosynovitis  - pt symptoms since January/Febr - pain over distal radius 8-10/10 at eval - pt with sweling over the radial head today and pain with thumb and wrist ROM in all planes- recommend for pt to see DR Candelaria Stagers for diagnostic US and possible shot- pt want to try therapy first-  since eval  contrast and fabricate custom thumb spica for L hand and wrist to wear most all the time  - can do ice massage and AROM  pain free 2-3 x day -   This date pain after work 5-6/10 pain - pt fitted with prefab thumb spica to switch to during day at work for pt to use any of the the 2 splints to decrease pain at work and immobilize wrist and thumb - today  she had her 4th  session of ionto consecatively and tolerating better and at 2.0  current- still swollen and tender and pain with wrist end range and thumb RA  - pt cont with splint and HEP -  pt cont with pain with decrease ROM and strenght limting her functional use of L hand in ADL's and IADlL's - pt can benefit from skilled OT services to decrease pain with increase ROM , strenght and functional use of L hand    OT Occupational Profile and History Problem Focused Assessment - Including review of records relating to presenting problem    Occupational performance deficits (Please refer to evaluation for details): ADL's;IADL's;Work;Rest and Sleep;Play;Leisure;Social Participation    Body Structure / Function / Physical Skills ADL;Flexibility;Sensation;IADL;ROM;UE functional use;Edema;Dexterity;Pain;Strength;Decreased knowledge of precautions    Rehab Potential Fair    Clinical Decision Making Limited treatment options, no task modification necessary    Comorbidities Affecting Occupational Performance: None    Modification or Assistance to Complete Evaluation  No modification of tasks or assist necessary to complete eval    OT Frequency 2x / week    OT Duration 4 weeks    OT Treatment/Interventions Self-care/ADL training;Cryotherapy;Moist Heat;Fluidtherapy;Contrast Bath;Therapeutic exercise;Manual Therapy;Patient/family education;Passive range of motion;DME and/or AE instruction;Splinting    Consulted and Agree with Plan of Care Patient             Patient will benefit from skilled therapeutic intervention in order to improve the following deficits and impairments:   Body Structure / Function / Physical Skills: ADL, Flexibility, Sensation, IADL, ROM, UE functional use, Edema, Dexterity, Pain, Strength, Decreased knowledge of precautions       Visit Diagnosis: Stiffness of left hand, not elsewhere classified  Radial styloid tenosynovitis  Pain in left wrist  Muscle weakness (generalized)  Stiffness of left wrist, not elsewhere classified    Problem  List Patient Active Problem List   Diagnosis Date Noted   Other dysphagia    Schatzki's ring    Screen for colon cancer    Plantar fasciitis 09/06/2019   Spinal stenosis of lumbar region with neurogenic claudication 01/15/2019   Vaginal atrophy 06/24/2017   Multiple allergies 01/13/2017   Major depression, recurrent ([place]) 12/26/2016   Varicose veins of leg with pain, bilateral 12/12/2016   Allergic rhinitis    History of skin cancer     Rosalyn Gess, OTR/L,CLT 01/29/2021, 1:14 PM  [facility] [place] [facility] [address], [place], [address] Phone: [phone]   Fax:  [phone]  Name: Karen Walker. MRN: [id number] Date of Birth: 01/05/1971

## 2021-02-01 ENCOUNTER — Other Ambulatory Visit: Payer: Self-pay

## 2021-02-01 ENCOUNTER — Ambulatory Visit: Payer: 59 | Admitting: Occupational Therapy

## 2021-02-01 DIAGNOSIS — M6281 Muscle weakness (generalized): Secondary | ICD-10-CM

## 2021-02-01 DIAGNOSIS — M25532 Pain in left wrist: Secondary | ICD-10-CM

## 2021-02-01 DIAGNOSIS — M25642 Stiffness of left hand, not elsewhere classified: Secondary | ICD-10-CM | POA: Diagnosis not present

## 2021-02-01 DIAGNOSIS — M654 Radial styloid tenosynovitis [de Quervain]: Secondary | ICD-10-CM | POA: Diagnosis not present

## 2021-02-01 DIAGNOSIS — M25632 Stiffness of left wrist, not elsewhere classified: Secondary | ICD-10-CM | POA: Diagnosis not present

## 2021-02-01 NOTE — Therapy (Signed)
[facility] [address] [place], [address] Phone: [phone]   Fax:  [phone]  Occupational Therapy Treatment  Patient Details  Name: Karen Walker. MRN: [id number] Date of Birth: [birth year] Referring Provider (OT): DR Wynetta Emery   Encounter Date: 02/01/2021   OT End of Session - 02/01/21 0930     Visit Number 7    Number of Visits 10    Date for OT Re-Evaluation 02/12/21    OT Start Time 0924    OT Stop Time 1020    OT Time Calculation (min) 56 min    Activity Tolerance Patient tolerated treatment well    Behavior During Therapy [facility] for tasks assessed/performed             Past Medical History:  Diagnosis Date   Allergic rhinitis    Anxiety    Breast lump on right side at 8 o'clock position 12/06/2014   Depression    Dysplastic nevus 02/14/2020   Mod to severe - close to margin R foot inf to her med maleolus   History of skin cancer    Hx of migraine headaches    light sensivity, unilateral HA   Urticaria     Past Surgical History:  Procedure Laterality Date   ABDOMINAL HYSTERECTOMY  2014   BREAST CYST ASPIRATION Right 2013   negative. Done at Dr. Dwyane Luo office   COLONOSCOPY WITH PROPOFOL N/A 07/20/2020   Procedure: COLONOSCOPY WITH PROPOFOL;  Surgeon: Virgel Manifold, MD;  Location: [facility] ENDOSCOPY;  Service: Endoscopy;  Laterality: N/A;  SPANISH INTERPRETER   ESOPHAGOGASTRODUODENOSCOPY (EGD) WITH PROPOFOL N/A 07/20/2020   Procedure: ESOPHAGOGASTRODUODENOSCOPY (EGD) WITH PROPOFOL;  Surgeon: Virgel Manifold, MD;  Location: [facility] ENDOSCOPY;  Service: Endoscopy;  Laterality: N/A;   HERNIA REPAIR  2014   hysterectemy     melonoma removal on R medial arch of foot  2008   TOTAL ABDOMINAL HYSTERECTOMY W/ BILATERAL SALPINGOOPHORECTOMY     UMBILICAL HERNIA REPAIR     occurred with hysterectemy    There were no vitals filed for this visit.   Subjective Assessment - 02/01/21  0928     Subjective  I can tell it is getting better- swelling going down -and pain is getting better- but still very tender - I was suppose to be off today but went in to help this morning    Pertinent History Was seen by PCP on 10/26/20 - xrays was negative, and then 11/06/20 cont  L  wrist pain and DeQuervain - Prednisone and Voltaren -but not improving -and follow up again with PCP 12/13/20 and refer to OT    Patient Stated Goals I want the pain and swelling better so I can use my L hand and wrist to do my job, take care of my house and kids/grand kids    Currently in Pain? Yes    Pain Score 2    rest and AROM -tenderness over distal radius 9/10                     Little more tender today -had to keep at 1.0 current - stopped at 20 min     OT Treatments/Exercises (OP) - 02/01/21 0001       Iontophoresis   Type of Iontophoresis Dexamethasone    Location 1st dorsal compartment    Dose 1.0 current . med patch    Time 21      LUE Contrast Bath  Time 8 minutes    Comments prior t opain free AROM and soft tissue               TEnderness over distal radius head and finkelstein cont to be increase  Pain coming in 2/10 - pain decreasing slow but steady- depends on her work duties for the day Fitted with prefab thumb  spica as another option to immobilize thumb and wrist at work  las time - feels like holding her wrist better Using it a lot in the kitchen  and preforming her job Pain  less with thumb RA  and PA Opposition WFL Wrist flexion, ext, RD ,UD end range pain   Pt report not sleeping with it  Soft tissue done over volar and dorsal wrist and hand - MC spreads and CT spreads  - edema decrease over 1st dorsal compartment but still swollen -  recommend for ionto 2 x wk        OT Education - 02/01/21 0929     Education Details splint wearing and ionto use    Person(s) Educated Patient    Methods Explanation;Demonstration;Tactile cues;Verbal cues;Handout     Comprehension Verbal cues required;Returned demonstration;Verbalized understanding              OT Short Term Goals - 01/01/21 1512       OT SHORT TERM GOAL #1   Title Pt to be independent in HEP and splint wearing to decrease pain on PRWHE with more than 25 points    Baseline pain on PRWHE is 48/50    Time 4    Period Weeks    Status New    Target Date 01/29/21               OT Long Term Goals - 01/01/21 1513       OT LONG TERM GOAL #1   Title Pain decrease in L wrist and thumb for pt to show thumb and wrist AROM WFL to wean out of splint more than 50% of time    Baseline pain in all planes for L wrist and thumb  - pain 8-10/10 - decrease thumb AROM , wrist WNL pain 10/10    Time 5    Period Weeks    Status New    Target Date 02/05/21      OT LONG TERM GOAL #2   Title L grip and prehension strength increase to more than 60% compare to R to wean out of splint and use hand in work 50% without splint    Baseline NT - pain at rest 8/10 and AROM for thumb and wrist  10/10 -    Time 6    Period Weeks    Status New    Target Date 02/12/21                   Plan - 02/01/21 0930     Clinical Impression Statement Pt present at OT eval with diagnosis of L DeQuervain/1st dorsal compartment tenosynovitis  - pt symptoms since January/Febr - pain over distal radius 8-10/10 at eval - pt with sweling over the radial head today and pain with thumb and wrist ROM in all planes- recommend for pt to see DR Candelaria Stagers for diagnostic US and possible shot- pt want to try therapy first-  since eval  contrast and fabricate custom thumb spica for L hand and wrist to wear most all the time  - can do ice massage and AROM pain  free 2-3 x day -    TODAY pain decrease at rest to 2/10 coming in  - pt fitted with prefab thumb spica last session to switch to during day at work for pt to use any of the the 2 splints to decrease pain at work and immobilize wrist and thumb -5th session today of  ionto consecatively - swelling decreasing as well as tenderness -cont pain with wrist end range and thumb RA  - pt cont with splint and HEP -  pt cont with pain with decrease ROM and strenght limting her functional use of L hand in ADL's and IADlL's - pt can benefit from skilled OT services to decrease pain with increase ROM , strenght and functional use of L hand    OT Occupational Profile and History Problem Focused Assessment - Including review of records relating to presenting problem    Occupational performance deficits (Please refer to evaluation for details): ADL's;IADL's;Work;Rest and Sleep;Play;Leisure;Social Participation    Body Structure / Function / Physical Skills ADL;Flexibility;Sensation;IADL;ROM;UE functional use;Edema;Dexterity;Pain;Strength;Decreased knowledge of precautions    Rehab Potential Fair    Clinical Decision Making Limited treatment options, no task modification necessary    Comorbidities Affecting Occupational Performance: None    Modification or Assistance to Complete Evaluation  No modification of tasks or assist necessary to complete eval    OT Frequency 2x / week    OT Duration 4 weeks    OT Treatment/Interventions Self-care/ADL training;Cryotherapy;Moist Heat;Fluidtherapy;Contrast Bath;Therapeutic exercise;Manual Therapy;Patient/family education;Passive range of motion;DME and/or AE instruction;Splinting    Consulted and Agree with Plan of Care Patient             Patient will benefit from skilled therapeutic intervention in order to improve the following deficits and impairments:   Body Structure / Function / Physical Skills: ADL, Flexibility, Sensation, IADL, ROM, UE functional use, Edema, Dexterity, Pain, Strength, Decreased knowledge of precautions       Visit Diagnosis: Stiffness of left hand, not elsewhere classified  Radial styloid tenosynovitis  Pain in left wrist  Muscle weakness (generalized)  Stiffness of left wrist, not elsewhere  classified    Problem List Patient Active Problem List   Diagnosis Date Noted   Other dysphagia    Schatzki's ring    Screen for colon cancer    Plantar fasciitis 09/06/2019   Spinal stenosis of lumbar region with neurogenic claudication 01/15/2019   Vaginal atrophy 06/24/2017   Multiple allergies 01/13/2017   Major depression, recurrent ([place]) 12/26/2016   Varicose veins of leg with pain, bilateral 12/12/2016   Allergic rhinitis    History of skin cancer     Rosalyn Gess, OTR/L,CLT 02/01/2021, 9:56 AM  [facility] [address], [place], [address] Phone: [phone]   Fax:  [phone]  Name: Muntaha Vermette. MRN: [id number] Date of Birth: Aug 18, 1970

## 2021-02-04 ENCOUNTER — Ambulatory Visit: Payer: 59 | Admitting: Occupational Therapy

## 2021-02-05 ENCOUNTER — Encounter: Payer: 59 | Admitting: Occupational Therapy

## 2021-02-07 ENCOUNTER — Ambulatory Visit: Payer: 59 | Attending: Family Medicine | Admitting: Occupational Therapy

## 2021-02-07 DIAGNOSIS — M654 Radial styloid tenosynovitis [de Quervain]: Secondary | ICD-10-CM | POA: Diagnosis not present

## 2021-02-07 DIAGNOSIS — M25642 Stiffness of left hand, not elsewhere classified: Secondary | ICD-10-CM | POA: Insufficient documentation

## 2021-02-07 DIAGNOSIS — M6281 Muscle weakness (generalized): Secondary | ICD-10-CM | POA: Insufficient documentation

## 2021-02-07 DIAGNOSIS — M25632 Stiffness of left wrist, not elsewhere classified: Secondary | ICD-10-CM | POA: Diagnosis not present

## 2021-02-07 DIAGNOSIS — M25532 Pain in left wrist: Secondary | ICD-10-CM | POA: Diagnosis not present

## 2021-02-07 NOTE — Therapy (Signed)
[facility] [address], [place], [address] Phone: [phone]   Fax:  [phone]  Occupational Therapy Treatment  Patient Details  Name: Karen Walker. MRN: [id number] Date of Birth: 1970-09-13 Referring Provider (OT): DR Wynetta Emery   Encounter Date: 02/07/2021   OT End of Session - 02/07/21 1245     Visit Number 8    Number of Visits 10    Date for OT Re-Evaluation 02/12/21    OT Start Time 0762    OT Stop Time 1325    OT Time Calculation (min) 55 min    Activity Tolerance Patient tolerated treatment well    Behavior During Therapy [facility] for tasks assessed/performed             Past Medical History:  Diagnosis Date   Allergic rhinitis    Anxiety    Breast lump on right side at 8 o'clock position 12/06/2014   Depression    Dysplastic nevus 02/14/2020   Mod to severe - close to margin R foot inf to her med maleolus   History of skin cancer    Hx of migraine headaches    light sensivity, unilateral HA   Urticaria     Past Surgical History:  Procedure Laterality Date   ABDOMINAL HYSTERECTOMY  2014   BREAST CYST ASPIRATION Right 2013   negative. Done at Dr. Dwyane Luo office   COLONOSCOPY WITH PROPOFOL N/A 07/20/2020   Procedure: COLONOSCOPY WITH PROPOFOL;  Surgeon: Virgel Manifold, MD;  Location: [facility] ENDOSCOPY;  Service: Endoscopy;  Laterality: N/A;  SPANISH INTERPRETER   ESOPHAGOGASTRODUODENOSCOPY (EGD) WITH PROPOFOL N/A 07/20/2020   Procedure: ESOPHAGOGASTRODUODENOSCOPY (EGD) WITH PROPOFOL;  Surgeon: Virgel Manifold, MD;  Location: [facility] ENDOSCOPY;  Service: Endoscopy;  Laterality: N/A;   HERNIA REPAIR  2014   hysterectemy     melonoma removal on R medial arch of foot  2008   TOTAL ABDOMINAL HYSTERECTOMY W/ BILATERAL SALPINGOOPHORECTOMY     UMBILICAL HERNIA REPAIR     occurred with hysterectemy    There were no vitals filed for this visit.   Subjective Assessment - 02/07/21 1232      Subjective  I forgot my splint today and somebody wrap this washcloth around - but it is really bad today - pain 10/10    Pertinent History Was seen by PCP on 10/26/20 - xrays was negative, and then 11/06/20 cont  L  wrist pain and DeQuervain - Prednisone and Voltaren -but not improving -and follow up again with PCP 12/13/20 and refer to OT    Patient Stated Goals I want the pain and swelling better so I can use my L hand and wrist to do my job, take care of my house and kids/grand kids    Currently in Pain? Yes    Pain Score 9     Pain Location Wrist                 TEnderness over distal radius head and finkelstein cont to be increase 9/10 to 10/10 today - forgot her splint today at work Pain coming in 9/10 after using a lot at work - When she gets home to donn her  prefab thumb  spica  or custom - and focus on decrease pain and edema   Pain at rest 9/10 Pain with thumb RA  and PA - resting position decrease thumb RA - thumb weaker  Opposition WFL Wrist flexion, ext, RD ,UD end range  pain   Pt report not sleeping with it - better without Soft tissue done over volar and dorsal wrist and hand - MC spreads and CT spreads  -         OT Treatments/Exercises (OP) - 02/07/21 0001       Iontophoresis   Type of Iontophoresis Dexamethasone    Location 1st dorsal compartment    Dose 0.6  current . med patch- stopped after 25 min    Time 25      LUE Contrast Bath   Time 8 minutes    Comments decreaes pain from 9-6/10                    OT Education - 02/07/21 1245     Education Details splint wearing and ionto use    Person(s) Educated Patient    Methods Explanation;Demonstration;Tactile cues;Verbal cues;Handout    Comprehension Verbal cues required;Returned demonstration;Verbalized understanding              OT Short Term Goals - 01/01/21 1512       OT SHORT TERM GOAL #1   Title Pt to be independent in HEP and splint wearing to decrease pain on PRWHE  with more than 25 points    Baseline pain on PRWHE is 48/50    Time 4    Period Weeks    Status New    Target Date 01/29/21               OT Long Term Goals - 01/01/21 1513       OT LONG TERM GOAL #1   Title Pain decrease in L wrist and thumb for pt to show thumb and wrist AROM WFL to wean out of splint more than 50% of time    Baseline pain in all planes for L wrist and thumb  - pain 8-10/10 - decrease thumb AROM , wrist WNL pain 10/10    Time 5    Period Weeks    Status New    Target Date 02/05/21      OT LONG TERM GOAL #2   Title L grip and prehension strength increase to more than 60% compare to R to wean out of splint and use hand in work 50% without splint    Baseline NT - pain at rest 8/10 and AROM for thumb and wrist  10/10 -    Time 6    Period Weeks    Status New    Target Date 02/12/21                   Plan - 02/07/21 1245     Clinical Impression Statement Pt present at OT eval with diagnosis of L DeQuervain/1st dorsal compartment tenosynovitis  - pt symptoms since January/Febr - pain over distal radius 8-10/10 at eval - pt with sweling over the radial head today and pain with thumb and wrist ROM in all planes- recommend for pt to see DR Candelaria Stagers for diagnostic US and possible shot- pt want to try therapy first-  since eval  contrast and fabricate custom thumb spica for L hand and wrist to wear most all the time  - can do ice massage and AROM pain free 2-3 x day -     Pt was doing great  until last week - pain decreased to  2/10 but today after working again and forgot her splint today at work - pain 9/10 - increase weakness in thumb RA-  pain decrease with contrast - but able to tolerate only 0.5 current  - pt did miss her appt earlier this week - pt to cont to wear prefab thumb spica / or custom at work and home to decrease pain 6th session today of ionto consecatively - swelling  and pain increase today again  -cont pain with wrist end range and thumb RA  -  pt cont with splint and HEP -  pt cont with pain with decrease ROM and strenght limting her functional use of L hand in ADL's and IADlL's - pt can benefit from skilled OT services to decrease pain with increase ROM , strenght and functional use of L hand    OT Occupational Profile and History Problem Focused Assessment - Including review of records relating to presenting problem    Occupational performance deficits (Please refer to evaluation for details): ADL's;IADL's;Work;Rest and Sleep;Play;Leisure;Social Participation    Body Structure / Function / Physical Skills ADL;Flexibility;Sensation;IADL;ROM;UE functional use;Edema;Dexterity;Pain;Strength;Decreased knowledge of precautions    Rehab Potential Fair    Clinical Decision Making Limited treatment options, no task modification necessary    Comorbidities Affecting Occupational Performance: None    Modification or Assistance to Complete Evaluation  No modification of tasks or assist necessary to complete eval    OT Frequency 2x / week    OT Duration 4 weeks    OT Treatment/Interventions Self-care/ADL training;Cryotherapy;Moist Heat;Fluidtherapy;Contrast Bath;Therapeutic exercise;Manual Therapy;Patient/family education;Passive range of motion;DME and/or AE instruction;Splinting    Consulted and Agree with Plan of Care Patient             Patient will benefit from skilled therapeutic intervention in order to improve the following deficits and impairments:   Body Structure / Function / Physical Skills: ADL, Flexibility, Sensation, IADL, ROM, UE functional use, Edema, Dexterity, Pain, Strength, Decreased knowledge of precautions       Visit Diagnosis: Stiffness of left hand, not elsewhere classified  Radial styloid tenosynovitis  Pain in left wrist  Muscle weakness (generalized)  Stiffness of left wrist, not elsewhere classified    Problem List Patient Active Problem List   Diagnosis Date Noted   Other dysphagia    Schatzki's  ring    Screen for colon cancer    Plantar fasciitis 09/06/2019   Spinal stenosis of lumbar region with neurogenic claudication 01/15/2019   Vaginal atrophy 06/24/2017   Multiple allergies 01/13/2017   Major depression, recurrent ([place]) 12/26/2016   Varicose veins of leg with pain, bilateral 12/12/2016   Allergic rhinitis    History of skin cancer     Karen Walker, OTR/L,CLT 02/07/2021, 1:06 PM  [facility] [address], [place], [address] Phone: [phone]   Fax:  [phone]  Name: Karen Walker. MRN: [id number] Date of Birth: May 13, 1970

## 2021-02-08 ENCOUNTER — Encounter: Payer: 59 | Admitting: Occupational Therapy

## 2021-02-11 ENCOUNTER — Ambulatory Visit: Payer: 59 | Admitting: Occupational Therapy

## 2021-02-11 DIAGNOSIS — M25532 Pain in left wrist: Secondary | ICD-10-CM

## 2021-02-11 DIAGNOSIS — M6281 Muscle weakness (generalized): Secondary | ICD-10-CM | POA: Diagnosis not present

## 2021-02-11 DIAGNOSIS — M654 Radial styloid tenosynovitis [de Quervain]: Secondary | ICD-10-CM | POA: Diagnosis not present

## 2021-02-11 DIAGNOSIS — M25642 Stiffness of left hand, not elsewhere classified: Secondary | ICD-10-CM | POA: Diagnosis not present

## 2021-02-11 DIAGNOSIS — M25632 Stiffness of left wrist, not elsewhere classified: Secondary | ICD-10-CM

## 2021-02-11 NOTE — Therapy (Signed)
[facility] [address], [place], [address] Phone: [phone]   Fax:  [phone]  Occupational Therapy Treatment  Patient Details  Name: Karen Walker. MRN: [id number] Date of Birth: 06/27/1970 Referring Provider (OT): DR Wynetta Emery   Encounter Date: 02/11/2021   OT End of Session - 02/11/21 1321     Visit Number 9    Number of Visits 10    Date for OT Re-Evaluation 02/12/21    OT Start Time 1300    OT Stop Time 1401    OT Time Calculation (min) 61 min    Activity Tolerance Patient tolerated treatment well    Behavior During Therapy [facility] for tasks assessed/performed             Past Medical History:  Diagnosis Date   Allergic rhinitis    Anxiety    Breast lump on right side at 8 o'clock position 12/06/2014   Depression    Dysplastic nevus 02/14/2020   Mod to severe - close to margin R foot inf to her med maleolus   History of skin cancer    Hx of migraine headaches    light sensivity, unilateral HA   Urticaria     Past Surgical History:  Procedure Laterality Date   ABDOMINAL HYSTERECTOMY  2014   BREAST CYST ASPIRATION Right 2013   negative. Done at Dr. Dwyane Luo office   COLONOSCOPY WITH PROPOFOL N/A 07/20/2020   Procedure: COLONOSCOPY WITH PROPOFOL;  Surgeon: Virgel Manifold, MD;  Location: [facility] ENDOSCOPY;  Service: Endoscopy;  Laterality: N/A;  SPANISH INTERPRETER   ESOPHAGOGASTRODUODENOSCOPY (EGD) WITH PROPOFOL N/A 07/20/2020   Procedure: ESOPHAGOGASTRODUODENOSCOPY (EGD) WITH PROPOFOL;  Surgeon: Virgel Manifold, MD;  Location: [facility] ENDOSCOPY;  Service: Endoscopy;  Laterality: N/A;   HERNIA REPAIR  2014   hysterectemy     melonoma removal on R medial arch of foot  2008   TOTAL ABDOMINAL HYSTERECTOMY W/ BILATERAL SALPINGOOPHORECTOMY     UMBILICAL HERNIA REPAIR     occurred with hysterectemy    There were no vitals filed for this visit.   Subjective Assessment - 02/11/21 1313      Subjective  I did not work this weekend- Saturday it was still bad- but Sunday it was better- this morning pain was 2/10 - but then increase now after work 7-8/10 - look how my splint look    Pertinent History Was seen by PCP on 10/26/20 - xrays was negative, and then 11/06/20 cont  L  wrist pain and DeQuervain - Prednisone and Voltaren -but not improving -and follow up again with PCP 12/13/20 and refer to OT    Patient Stated Goals I want the pain and swelling better so I can use my L hand and wrist to do my job, take care of my house and kids/grand kids    Currently in Pain? Yes    Pain Score 7     Pain Location Wrist    Pain Orientation Left    Pain Descriptors / Indicators Aching;Throbbing;Tender    Pain Type Acute pain    Pain Onset More than a month ago    Pain Frequency Constant                TEnderness over distal radius head and finkelstein cont to be increase 7-9/10 the last 2 wks after her work Pain coming in 7-8/10 after using a lot at work  Pain with thumb RA  and PA -  resting position  pt show decrease thumb RA and PA with increase pain - thumb weaker the last 1-2 wks Opposition [facility] Wrist flexion, ext, RD ,UD end range pain   Pt report not sleeping with it - better without Soft tissue done over volar and dorsal wrist and hand - MC spreads and CT spreads   Fitted with new prefab thumb spica- broken the previous one from wearing it and work - pt to use out side of work the custom thumb spica  Did call Dr Candelaria Stagers office for appt for pt - pt has appt 22nd Nov           OT Treatments/Exercises (OP) - 02/11/21 0001       Iontophoresis   Type of Iontophoresis Dexamethasone    Location 1st dorsal compartment    Dose 0.9 current, med patch - 22 min      LUE Contrast Bath   Time 8 minutes    Comments decrease pain            pt tolerated little better this date ionto - keep on patch for hour afterwards         OT Education - 02/11/21 1321      Education Details splint wearing and ionto use    Person(s) Educated Patient    Methods Explanation;Demonstration;Tactile cues;Verbal cues;Handout    Comprehension Verbal cues required;Returned demonstration;Verbalized understanding              OT Short Term Goals - 01/01/21 1512       OT SHORT TERM GOAL #1   Title Pt to be independent in HEP and splint wearing to decrease pain on PRWHE with more than 25 points    Baseline pain on PRWHE is 48/50    Time 4    Period Weeks    Status New    Target Date 01/29/21               OT Long Term Goals - 01/01/21 1513       OT LONG TERM GOAL #1   Title Pain decrease in L wrist and thumb for pt to show thumb and wrist AROM WFL to wean out of splint more than 50% of time    Baseline pain in all planes for L wrist and thumb  - pain 8-10/10 - decrease thumb AROM , wrist WNL pain 10/10    Time 5    Period Weeks    Status New    Target Date 02/05/21      OT LONG TERM GOAL #2   Title L grip and prehension strength increase to more than 60% compare to R to wean out of splint and use hand in work 50% without splint    Baseline NT - pain at rest 8/10 and AROM for thumb and wrist  10/10 -    Time 6    Period Weeks    Status New    Target Date 02/12/21                   Plan - 02/11/21 1322     Clinical Impression Statement Pt present at OT eval with diagnosis of L DeQuervain/1st dorsal compartment tenosynovitis  - pt symptoms since January/Febr - pain over distal radius 8-10/10 at eval - pt with sweling over the radial head today and pain with thumb and wrist ROM in all planes- recommend for pt to see DR Candelaria Stagers for diagnostic US and possible shot at eval  but pt wanted to try therapy first-  since eval  contras, iontot and wearing of thumb spica for L hand and wrist to wear most all the time did decrease pt pain until about last week - 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 - in her off days pain decrease but work days increase lately to 7-9/10 again - pt since last week starting to loose thumb strength - discuss with pt again referral to Dr Candelaria Stagers - agree today -pt going to be out of town next week for her grand child birth and then did get her appt with DR K on 02/26/21 - and pt will be off at work until Dec 1st - Today is her 7th session of ionto  - swelling  and pain increase since last week   -cont to have pain with thumb and wrist motion  - pt cont with splint and HEP/Voltaren Vernice Jefferson -  pt cont with pain with decrease ROM and strenght limting her functional use of L hand in ADL's and IADlL's - pt can benefit from skilled OT services to decrease pain with increase ROM , strenght and functional use of L hand    OT Occupational Profile and History Problem Focused Assessment - Including review of records relating to presenting problem    Occupational performance deficits (Please refer to evaluation for details): ADL's;IADL's;Work;Rest and Sleep;Play;Leisure;Social Participation    Body Structure / Function / Physical Skills ADL;Flexibility;Sensation;IADL;ROM;UE functional use;Edema;Dexterity;Pain;Strength;Decreased knowledge of precautions    Rehab Potential Fair    Clinical Decision Making Limited treatment options, no task modification necessary    Comorbidities Affecting Occupational Performance: None    Modification or Assistance to Complete Evaluation  No modification of tasks or assist necessary to complete eval    OT Frequency 2x / week    OT Duration 4 weeks    OT Treatment/Interventions Self-care/ADL training;Cryotherapy;Moist Heat;Fluidtherapy;Contrast Bath;Therapeutic exercise;Manual Therapy;Patient/family education;Passive range of motion;DME and/or AE instruction;Splinting    Consulted and Agree with Plan of Care Patient             Patient will benefit from skilled therapeutic intervention in order to improve the following deficits and  impairments:   Body Structure / Function / Physical Skills: ADL, Flexibility, Sensation, IADL, ROM, UE functional use, Edema, Dexterity, Pain, Strength, Decreased knowledge of precautions       Visit Diagnosis: Stiffness of left hand, not elsewhere classified  Pain in left wrist  Radial styloid tenosynovitis  Muscle weakness (generalized)  Stiffness of left wrist, not elsewhere classified    Problem List Patient Active Problem List   Diagnosis Date Noted   Other dysphagia    Schatzki's ring    Screen for colon cancer    Plantar fasciitis 09/06/2019   Spinal stenosis of lumbar region with neurogenic claudication 01/15/2019   Vaginal atrophy 06/24/2017   Multiple allergies 01/13/2017   Major depression, recurrent ([place]) 12/26/2016   Varicose veins of leg with pain, bilateral 12/12/2016   Allergic rhinitis    History of skin cancer     Rosalyn Gess, OTR/L,CLT 02/11/2021, 7:51 PM   [facility] [address], [place], [address] Phone: [phone]   Fax:  [phone]  Name: Betsabe Iglesia. MRN: [id number] Date of Birth: 14-Jul-1970

## 2021-02-13 ENCOUNTER — Telehealth: Payer: Self-pay

## 2021-02-13 NOTE — Telephone Encounter (Signed)
Left detailed msg on VM per HIPAA Gave lab hours and instructions Recall placed for follow up OV

## 2021-02-13 NOTE — Telephone Encounter (Signed)
-----   Message from Virgel Manifold, MD sent at 01/31/2021  1:04 PM EDT ----- Threasa Beards, this pt saw me in the hospital today and asked about getting her H Pylori breath test done for eradication testing. There is already a pending H Pylori breath test in her chart. Please advise her when she can come to get this done. Please ensure clinic recall for 3-6 months

## 2021-02-15 ENCOUNTER — Other Ambulatory Visit: Payer: Self-pay

## 2021-02-15 ENCOUNTER — Ambulatory Visit: Payer: 59 | Admitting: Occupational Therapy

## 2021-02-15 DIAGNOSIS — M654 Radial styloid tenosynovitis [de Quervain]: Secondary | ICD-10-CM

## 2021-02-15 DIAGNOSIS — M6281 Muscle weakness (generalized): Secondary | ICD-10-CM | POA: Diagnosis not present

## 2021-02-15 DIAGNOSIS — M25632 Stiffness of left wrist, not elsewhere classified: Secondary | ICD-10-CM | POA: Diagnosis not present

## 2021-02-15 DIAGNOSIS — M25532 Pain in left wrist: Secondary | ICD-10-CM

## 2021-02-15 DIAGNOSIS — M25642 Stiffness of left hand, not elsewhere classified: Secondary | ICD-10-CM

## 2021-02-15 NOTE — Therapy (Signed)
[facility] [address] [place], [address] Phone: [phone]   Fax:  [phone]  Occupational Therapy Treatment  Patient Details  Name: Karen Walker. MRN: [id number] Date of Birth: [birth year] Referring Provider (OT): DR Wynetta Emery   Encounter Date: 02/15/2021   OT End of Session - 02/15/21 1220     Visit Number 10    Number of Visits 11    Date for OT Re-Evaluation 02/22/21    OT Start Time 1100    OT Stop Time 1145    OT Time Calculation (min) 45 min    Activity Tolerance Patient tolerated treatment well    Behavior During Therapy [facility] for tasks assessed/performed             Past Medical History:  Diagnosis Date   Allergic rhinitis    Anxiety    Breast lump on right side at 8 o'clock position 12/06/2014   Depression    Dysplastic nevus 02/14/2020   Mod to severe - close to margin R foot inf to her med maleolus   History of skin cancer    Hx of migraine headaches    light sensivity, unilateral HA   Urticaria     Past Surgical History:  Procedure Laterality Date   ABDOMINAL HYSTERECTOMY  2014   BREAST CYST ASPIRATION Right 2013   negative. Done at Dr. Dwyane Luo office   COLONOSCOPY WITH PROPOFOL N/A 07/20/2020   Procedure: COLONOSCOPY WITH PROPOFOL;  Surgeon: Virgel Manifold, MD;  Location: [facility] ENDOSCOPY;  Service: Endoscopy;  Laterality: N/A;  SPANISH INTERPRETER   ESOPHAGOGASTRODUODENOSCOPY (EGD) WITH PROPOFOL N/A 07/20/2020   Procedure: ESOPHAGOGASTRODUODENOSCOPY (EGD) WITH PROPOFOL;  Surgeon: Virgel Manifold, MD;  Location: [facility] ENDOSCOPY;  Service: Endoscopy;  Laterality: N/A;   HERNIA REPAIR  2014   hysterectemy     melonoma removal on R medial arch of foot  2008   TOTAL ABDOMINAL HYSTERECTOMY W/ BILATERAL SALPINGOOPHORECTOMY     UMBILICAL HERNIA REPAIR     occurred with hysterectemy    There were no vitals filed for this visit.   Subjective Assessment - 02/15/21  1217     Subjective  I am off today but pain still feels like it spreads - and some times in the bone - at work with the splint on - pain today 6/10 - but increase to 9-10/10 at work -we were so short staff the lsat 2 wks at work- lloking fw to my vacation next week    Pertinent History Was seen by PCP on 10/26/20 - xrays was negative, and then 11/06/20 cont  L  wrist pain and DeQuervain - Prednisone and Voltaren -but not improving -and follow up again with PCP 12/13/20 and refer to OT    Patient Stated Goals I want the pain and swelling better so I can use my L hand and wrist to do my job, take care of my house and kids/grand kids    Currently in Pain? Yes    Pain Score 6     Pain Location Wrist    Pain Orientation Left    Pain Descriptors / Indicators Aching;Tender;Throbbing    Pain Type Acute pain;Chronic pain    Pain Onset More than a month ago    Pain Frequency Constant              TEnderness over distal radius head and finkelstein cont to be increase 6--9/10 the last 2 wks after her work Pain  coming in 6/10 - but off today Pain with thumb RA  and PA - resting position  pt show decrease thumb RA and PA with increase pain - thumb weaker the last 2 wks  Wrist flexion, ext, RD ,UD end range pain  Pt report feeling pain in spreading  Swelling over distal radius    Soft tissue done over volar and dorsal wrist and hand - MC spreads and CT spreads   Fitted with new prefab thumb spica last visit- broken the previous one from wearing it and work  Fabricated this date forearm base custom thumb spica to immobilize wrist and base of thumb - to wear at work and home  Did call Dr Candelaria Stagers office for appt for pt - pt has appt 22nd Nov             OT Treatments/Exercises (OP) - 02/15/21 0001       Iontophoresis   Type of Iontophoresis Dexamethasone    Location 1st dorsal compartment    Dose 1.5 current, med patch    Time 22      LUE Contrast Bath   Time 8 minutes    Comments  decrease pain                    OT Education - 02/15/21 1220     Education Details splint wearing and ionto use    Person(s) Educated Patient    Methods Explanation;Demonstration;Tactile cues;Verbal cues;Handout    Comprehension Verbal cues required;Returned demonstration;Verbalized understanding              OT Short Term Goals - 02/15/21 1225       OT SHORT TERM GOAL #1   Title Pt to be independent in HEP and splint wearing to decrease pain on PRWHE with more than 25 points    Baseline pain on PRWHE is 48/50  NOW pain still increase about the same    Time 1    Period Weeks    Status On-going    Target Date 02/22/21               OT Long Term Goals - 02/15/21 1225       OT LONG TERM GOAL #1   Title Pain decrease in L wrist and thumb for pt to show thumb and wrist AROM WFL to wean out of splint more than 50% of time    Baseline pain in all planes for L wrist and thumb  - pain 8-10/10 - decrease thumb AROM , wrist WNL pain 10/10  - CONT Same issues - pain 7-9/10    Time 1    Period Weeks    Status On-going    Target Date 02/22/21      OT LONG TERM GOAL #2   Title L grip and prehension strength increase to more than 60% compare to R to wean out of splint and use hand in work 50% without splint    Baseline NT - pain at rest 8/10 and AROM for thumb and wrist  10/10 - SAME    Time 1    Period Weeks    Status On-going    Target Date 02/22/21                   Plan - 02/15/21 1221     Clinical Impression Statement Pt present at OT eval with diagnosis of L DeQuervain/1st dorsal compartment tenosynovitis  - pt symptoms since January/Febr - pain over distal  radius 8-10/10 at eval - pt with  continues sweling over the radial head  - worse the last 2 wks - pain with thumb and wrist ROM in all planes- recommend for pt to see DR Candelaria Stagers for diagnostic US and possible shot at eval but pt wanted to try therapy first-  since eval  contras, iontot and  wearing of thumb spica for L hand and wrist to wear most all the time did decrease pt pain until about 2 wks ago  - 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 - in her off days pain decrease but work days increase lately to 7-9/10 again - pt since last week starting to loose thumb strength - discuss with pt again referral to Dr Candelaria Stagers - agree earlier ths week -pt going to be out of town next week for her grand child birth and then did get her appt with DR Candelaria Stagers on 02/26/21 - and pt will be off at work until Dec 1st - Today is her 8th session of ionto  - swelling  and pain cont to be increase s -cont to have pain with thumb and wrist motion  - Fabricated this date custom forearm base thumb spica to immoblilized wrist and base of thumb at work and home - pt cont with splint and HEP/Voltaren Vernice Jefferson -  pt cont to have pain with decrease ROM and strenght limting her functional use of L hand in ADL's and IADlL's - pt can benefit from skilled OT services to decrease pain with increase ROM , strenght and functional use of L hand    OT Occupational Profile and History Problem Focused Assessment - Including review of records relating to presenting problem    Occupational performance deficits (Please refer to evaluation for details): ADL's;IADL's;Work;Rest and Sleep;Play;Leisure;Social Participation    Body Structure / Function / Physical Skills ADL;Flexibility;Sensation;IADL;ROM;UE functional use;Edema;Dexterity;Pain;Strength;Decreased knowledge of precautions    Rehab Potential Fair    Clinical Decision Making Limited treatment options, no task modification necessary    Comorbidities Affecting Occupational Performance: None    Modification or Assistance to Complete Evaluation  No modification of tasks or assist necessary to complete eval    OT Frequency 1x / week    OT Duration --   1 week   OT Treatment/Interventions Self-care/ADL  training;Cryotherapy;Moist Heat;Fluidtherapy;Contrast Bath;Therapeutic exercise;Manual Therapy;Patient/family education;Passive range of motion;DME and/or AE instruction;Splinting    Consulted and Agree with Plan of Care Patient             Patient will benefit from skilled therapeutic intervention in order to improve the following deficits and impairments:   Body Structure / Function / Physical Skills: ADL, Flexibility, Sensation, IADL, ROM, UE functional use, Edema, Dexterity, Pain, Strength, Decreased knowledge of precautions       Visit Diagnosis: Pain in left wrist  Radial styloid tenosynovitis  Stiffness of left hand, not elsewhere classified  Muscle weakness (generalized)  Stiffness of left wrist, not elsewhere classified    Problem List Patient Active Problem List   Diagnosis Date Noted   Other dysphagia    Schatzki's ring    Screen for colon cancer    Plantar fasciitis 09/06/2019   Spinal stenosis of lumbar region with neurogenic claudication 01/15/2019   Vaginal atrophy 06/24/2017   Multiple allergies 01/13/2017   Major depression, recurrent ([place]) 12/26/2016   Varicose veins of leg with pain, bilateral 12/12/2016   Allergic rhinitis    History of skin cancer  Rosalyn Gess, OTR/L,CLT 02/15/2021, 12:30 PM  [facility] [address] [place], [address] Phone: [phone]   Fax:  [phone]  Name: Karen Walker. MRN: [id number] Date of Birth: [birth year]

## 2021-02-18 ENCOUNTER — Ambulatory Visit: Payer: 59 | Admitting: Occupational Therapy

## 2021-02-18 DIAGNOSIS — M654 Radial styloid tenosynovitis [de Quervain]: Secondary | ICD-10-CM | POA: Diagnosis not present

## 2021-02-18 DIAGNOSIS — M25632 Stiffness of left wrist, not elsewhere classified: Secondary | ICD-10-CM | POA: Diagnosis not present

## 2021-02-18 DIAGNOSIS — M6281 Muscle weakness (generalized): Secondary | ICD-10-CM

## 2021-02-18 DIAGNOSIS — M25642 Stiffness of left hand, not elsewhere classified: Secondary | ICD-10-CM | POA: Diagnosis not present

## 2021-02-18 DIAGNOSIS — M25532 Pain in left wrist: Secondary | ICD-10-CM

## 2021-02-18 NOTE — Therapy (Signed)
[facility] [address] [place], [address] Phone: [phone]   Fax:  [phone]  Occupational Therapy Treatment  Patient Details  Name: Karen Walker. MRN: [id number] Date of Birth: Aug 24, 1970 Referring Provider (OT): DR Wynetta Emery   Encounter Date: 02/18/2021   OT End of Session - 02/18/21 1331     Visit Number 11    Number of Visits 11    Date for OT Re-Evaluation 02/22/21    OT Start Time 1200    OT Stop Time 1248    OT Time Calculation (min) 48 min    Activity Tolerance Patient tolerated treatment well    Behavior During Therapy [facility] for tasks assessed/performed             Past Medical History:  Diagnosis Date   Allergic rhinitis    Anxiety    Breast lump on right side at 8 o'clock position 12/06/2014   Depression    Dysplastic nevus 02/14/2020   Mod to severe - close to margin R foot inf to her med maleolus   History of skin cancer    Hx of migraine headaches    light sensivity, unilateral HA   Urticaria     Past Surgical History:  Procedure Laterality Date   ABDOMINAL HYSTERECTOMY  2014   BREAST CYST ASPIRATION Right 2013   negative. Done at Dr. Dwyane Luo office   COLONOSCOPY WITH PROPOFOL N/A 07/20/2020   Procedure: COLONOSCOPY WITH PROPOFOL;  Surgeon: Virgel Manifold, MD;  Location: [facility] END[facility]COPY;  Service: Endoscopy;  Laterality: N/A;  SPANISH INTERPRETER   ESOPHAGOGASTRODUODENOSCOPY (EGD) WITH PROPOFOL N/A 07/20/2020   Procedure: ESOPHAGOGASTRODUODENOSCOPY (EGD) WITH PROPOFOL;  Surgeon: Virgel Manifold, MD;  Location: [facility] ENDOSCOPY;  Service: Endoscopy;  Laterality: N/A;   HERNIA REPAIR  2014   hysterectemy     melonoma removal on R medial arch of foot  2008   TOTAL ABDOMINAL HYSTERECTOMY W/ BILATERAL SALPINGOOPHORECTOMY     UMBILICAL HERNIA REPAIR     occurred with hysterectemy    There were no vitals filed for this visit.   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 Yes    Pain Score 9                 [facility] OT Assessment - 02/18/21 0001       AROM   Left Wrist Extension 70 Degrees    Left Wrist Flexion 95 Degrees    Left Wrist Radial Deviation 15 Degrees    Left Wrist Ulnar Deviation 30 Degrees      Strength   Right Hand Grip (lbs) 50  Right Hand Lateral Pinch 15 lbs    Right Hand 3 Point Pinch 13 lbs    Left Hand Grip (lbs) 15    Left Hand Lateral Pinch 10 lbs    Left Hand 3 Point Pinch 5 lbs      Left Hand AROM   L Thumb Radial ADduction/ABduction 0-55 35    L Thumb Palmar ADduction/ABduction 0-45 45    L Thumb Opposition to Index --   opposition to 5th               TEnderness over distal radius head and finkelstein cont to be increase 8-9/10 the last 3 wks  Pain coming in 9/10 - but off today Pain with thumb RA  and PA - resting position  pt show decrease thumb RA and PA with increase pain - thumb weaker the last 3 wks Unable to do full RA   Wrist flexion, ext, RD ,UD end range pain  Swelling over distal radius    Soft tissue done over volar and dorsal wrist and hand - MC spreads and CT spreads   Fitted  in past with 2 prefab thumb spica last week one broke from wearing at work   Fabricated last visit another custom forearm base  thumb spica to immobilize  wrist and base of thumb - to wear at work and home Report pain with use - pt fighting the splint -  Did call Dr Candelaria Stagers office for appt for pt - pt has appt 22nd Nov           OT Treatments/Exercises (OP) - 02/18/21 0001       Iontophoresis   Type of Iontophoresis Dexamethasone    Location 1st dorsal compartment    Dose 1.2 current; med patch    Time 20      LUE Contrast Bath   Time 8 minutes    Comments decrease pain                    OT Education - 02/18/21 1331     Education Details splint wearing , MD appt    Person(s) Educated Patient    Methods Explanation;Demonstration;Tactile cues;Verbal cues;Handout    Comprehension Verbal cues required;Returned demonstration;Verbalized understanding              OT Short Term Goals - 02/15/21 1225       OT SHORT TERM GOAL #1   Title Pt to be independent in HEP and splint wearing to decrease pain on PRWHE with more than 25 points    Baseline pain on PRWHE is 48/50  NOW pain still increase about the same    Time 1    Period Weeks    Status On-going    Target Date 02/22/21               OT Long Term Goals - 02/15/21 1225       OT LONG TERM GOAL #1   Title Pain decrease in L wrist and thumb for pt to show thumb and wrist AROM WFL to wean out of splint more than 50% of time    Baseline pain in all planes for L wrist and thumb  - pain 8-10/10 - decrease thumb AROM , wrist WNL pain 10/10  - CONT Same issues - pain 7-9/10    Time 1    Period Weeks    Status On-going    Target Date 02/22/21      OT LONG TERM  GOAL #2   Title L grip and prehension strength increase to more than 60% compare to R to wean out of splint and use hand in work 50% without splint    Baseline NT - pain at rest 8/10 and AROM for thumb and wrist  10/10 - SAME    Time 1    Period Weeks    Status On-going    Target Date 02/22/21                   Plan - 02/18/21 1332     Clinical Impression Statement Pt present at  OT eval with diagnosis of L DeQuervain/1st dorsal compartment tenosynovitis  - pt symptoms since January/Febr - pain over distal radius 8-10/10 at eval - pt with  continues sweling over the radial head  - worse the last 2 wks - pain with thumb and wrist ROM in all planes- recommended for pt to see DR Candelaria Stagers for diagnostic US and possible shot at eval but pt wanted to try therapy first-  since eval pt showed improvement for about 2 wks but the last 3 wks with with increase pain again , edema and increase weakness in thumb RA - 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 - her pain do not decrease anymore on her off days - discuss with pt again referral to Dr Candelaria Stagers - agree last week -pt going to be out of town from [facility] for her grand child birth and then  she has appt with DR Candelaria Stagers on 02/26/21 - pt will be off from work until 29th Nov- Today is her 9th session of ionto  - She has prefab and custom forearm base thumb spicas to immoblilized wrist and base of thumb at work and home - but pt report pain with wearing them - fighting the splints because of to much motion requires by her work - pt cont with splint and HEP/Voltaren Karen Walker -  pt cont to have pain with decrease ROM and strenght limting her functional use of L hand in ADL's and IADlL's -Pt has appt with DR Candelaria Stagers on 22nd Nov.    OT Occupational Profile and History Problem Focused Assessment - Including review of records relating to presenting problem    Occupational performance deficits (Please refer to evaluation for details): ADL's;IADL's;Work;Rest and Sleep;Play;Leisure;Social Participation    Body Structure / Function / Physical Skills ADL;Flexibility;Sensation;IADL;ROM;UE functional use;Edema;Dexterity;Pain;Strength;Decreased knowledge of precautions    Rehab Potential Fair    Clinical Decision Making Limited treatment options, no task modification  necessary    Comorbidities Affecting Occupational Performance: None    Modification or Assistance to Complete Evaluation  No modification of tasks or assist necessary to complete eval    OT Treatment/Interventions Self-care/ADL training;Cryotherapy;Moist Heat;Fluidtherapy;Contrast Bath;Therapeutic exercise;Manual Therapy;Patient/family education;Passive range of motion;DME and/or AE instruction;Splinting    Consulted and Agree with Plan of Care Patient             Patient will benefit from skilled therapeutic intervention in order to improve the following deficits and impairments:   Body Structure / Function / Physical Skills: ADL, Flexibility, Sensation, IADL, ROM, UE functional use, Edema, Dexterity, Pain, Strength, Decreased knowledge of precautions       Visit Diagnosis: Pain in left wrist  Radial styloid tenosynovitis  Stiffness of left hand, not elsewhere classified  Muscle weakness (generalized)  Stiffness of left wrist, not elsewhere classified  Problem List Patient Active Problem List   Diagnosis Date Noted   Other dysphagia    Schatzki's ring    Screen for colon cancer    Plantar fasciitis 09/06/2019   Spinal stenosis of lumbar region with neurogenic claudication 01/15/2019   Vaginal atrophy 06/24/2017   Multiple allergies 01/13/2017   Major depression, recurrent ([place]) 12/26/2016   Varicose veins of leg with pain, bilateral 12/12/2016   Allergic rhinitis    History of skin cancer     Karen Walker, OTR/L,CLT 02/18/2021, 1:39 PM  [facility] [address] [place], [address] Phone: [phone]   Fax:  [phone]  Name: Karen Walker. MRN: [id number] Date of Birth: Jan 19, 1971

## 2021-02-26 DIAGNOSIS — M25532 Pain in left wrist: Secondary | ICD-10-CM | POA: Diagnosis not present

## 2021-02-26 DIAGNOSIS — M65849 Other synovitis and tenosynovitis, unspecified hand: Secondary | ICD-10-CM | POA: Diagnosis not present

## 2021-02-26 DIAGNOSIS — M25432 Effusion, left wrist: Secondary | ICD-10-CM | POA: Diagnosis not present

## 2021-02-26 DIAGNOSIS — M654 Radial styloid tenosynovitis [de Quervain]: Secondary | ICD-10-CM | POA: Diagnosis not present

## 2021-03-22 ENCOUNTER — Telehealth: Payer: Self-pay | Admitting: Family Medicine

## 2021-03-22 ENCOUNTER — Encounter: Payer: Self-pay | Admitting: Family Medicine

## 2021-03-22 NOTE — Telephone Encounter (Signed)
Patient's daughter tested positive for COVID. She will test today. Started feeling sick this AM. If positive will send meds in.

## 2021-03-30 IMAGING — CR LUMBAR SPINE - COMPLETE 4+ VIEW
1 series · 5 of 5 positions shown · non-contrast
Comparison: None.

CLINICAL DATA: Right mid/low back pain the past 4 months. No known
injury.

EXAM:
LUMBAR SPINE - COMPLETE 4+ VIEW

[Series 1: dg lumbar spine complete 4 +v · 0.14mm/px · 5 of 5 slices shown]
[im 1/5]
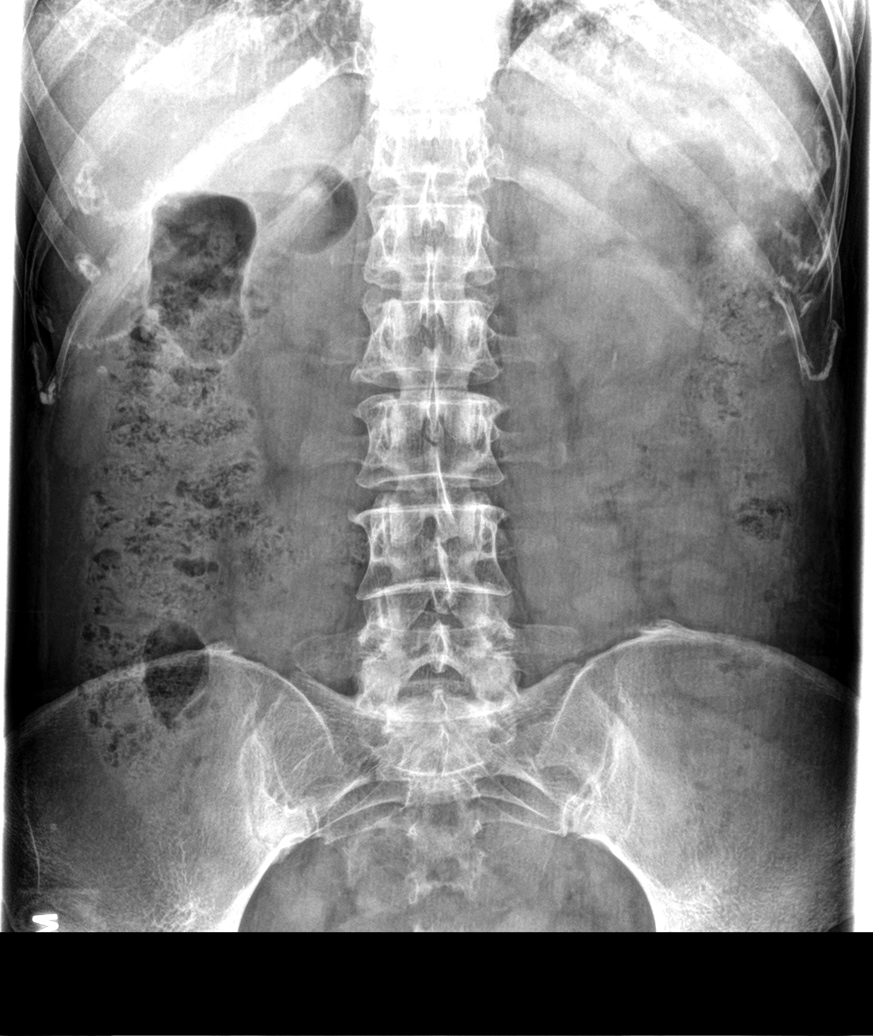
[im 2/5]
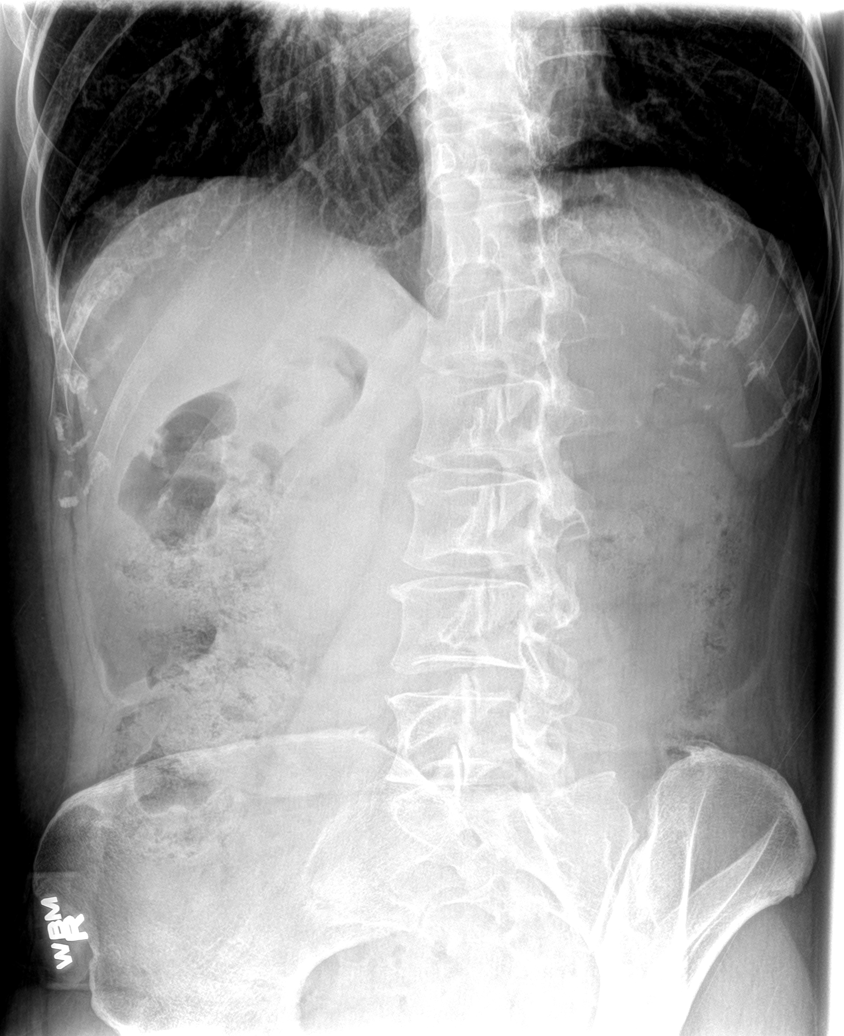
[im 3/5]
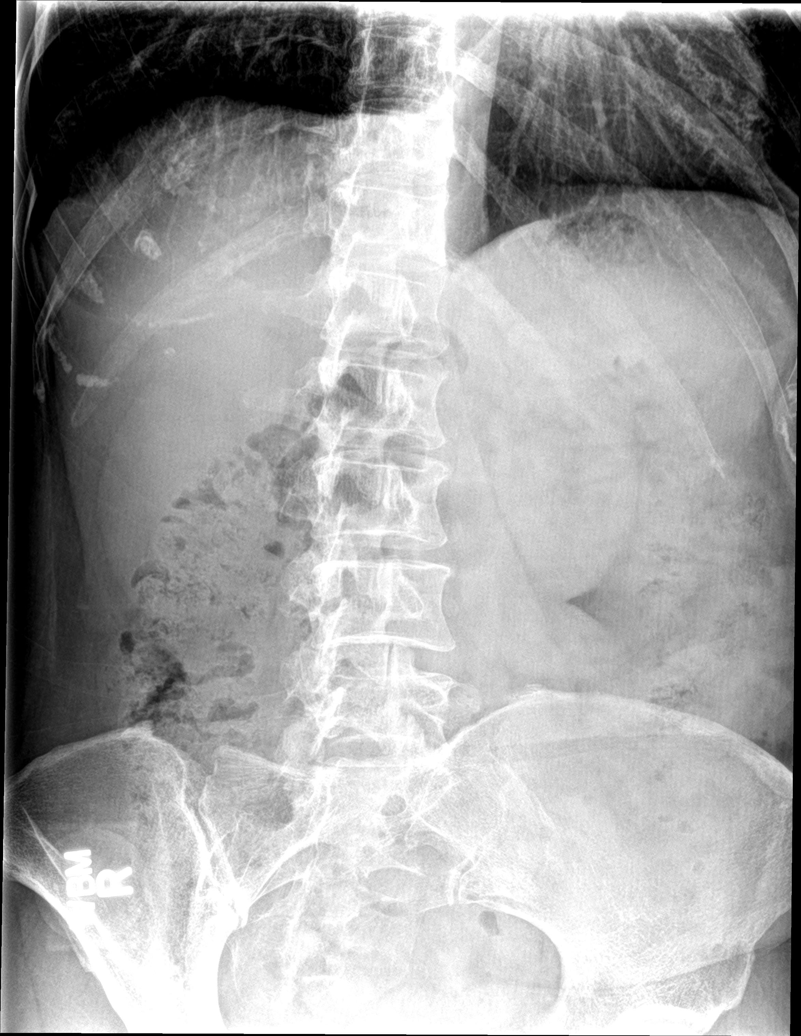
[im 4/5]
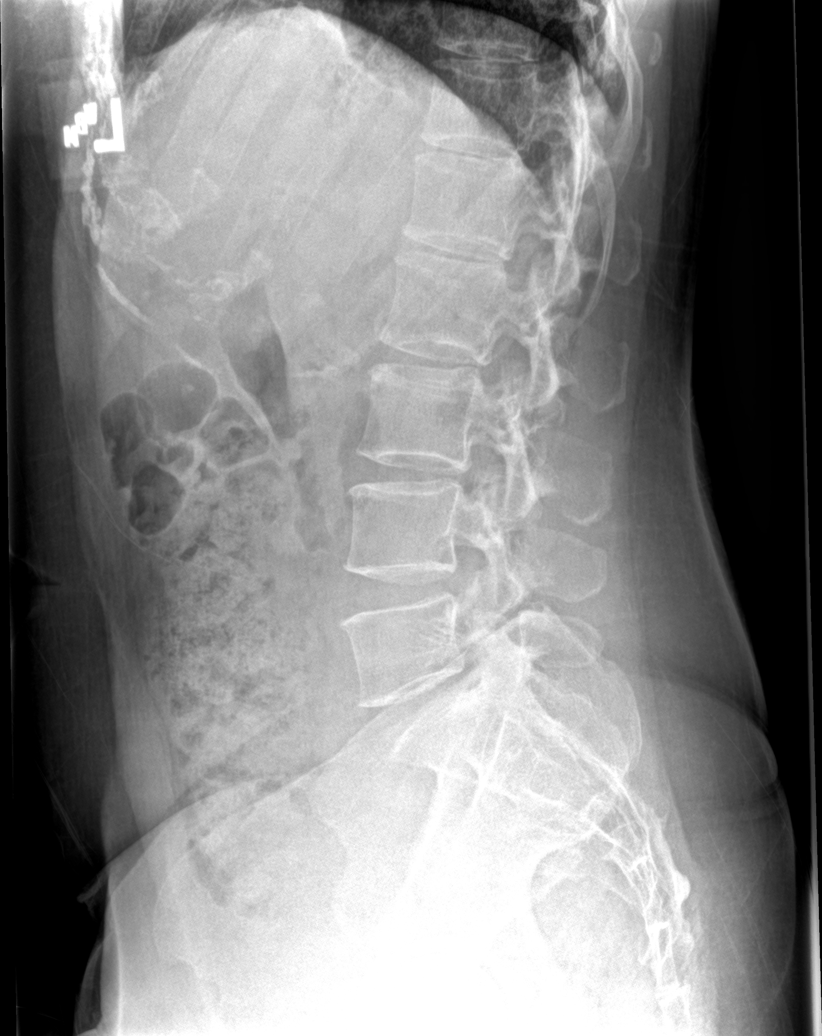
[im 5/5]
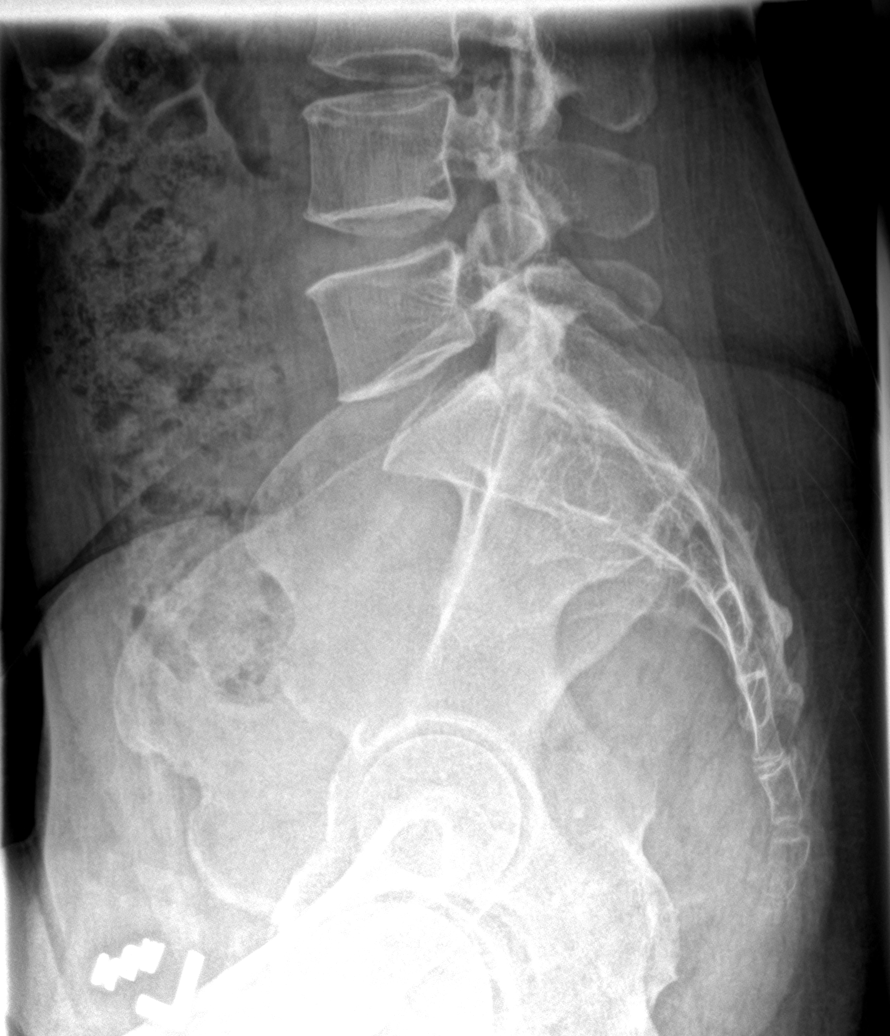

[5 of 5 positions shown; findings below may reference images not displayed]

FINDINGS: There are 5 non rib-bearing lumbar type vertebral bodies.

There is mild straightening of the expected lumbar lordosis. No
anterolisthesis or retrolisthesis.

Lumbar vertebral body heights appear preserved.

Mild multilevel lumbar spine DDD, worse at L1-L2, L2-L3 and L3-L4
with disc space height loss, endplate irregularity and sclerosis.

Limited visualization of the bilateral SI joints is normal.

Moderate to large colonic stool burden without evidence of enteric
obstruction.
IMPRESSION: 1. Mild straightening of the expected lumbar lordosis, nonspecific
though could be seen in the setting of muscle spasm. Otherwise, no
acute findings.
2. Mild multilevel lumbar spine DDD.

## 2021-04-17 ENCOUNTER — Ambulatory Visit: Payer: Self-pay | Admitting: *Deleted

## 2021-04-17 ENCOUNTER — Ambulatory Visit: Payer: 59 | Admitting: Nurse Practitioner

## 2021-04-17 ENCOUNTER — Encounter: Payer: Self-pay | Admitting: Nurse Practitioner

## 2021-04-17 ENCOUNTER — Other Ambulatory Visit: Payer: Self-pay

## 2021-04-17 VITALS — BP 96/62 | HR 76 | Temp 97.9°F | Wt 154.4 lb

## 2021-04-17 DIAGNOSIS — J029 Acute pharyngitis, unspecified: Secondary | ICD-10-CM | POA: Diagnosis not present

## 2021-04-17 DIAGNOSIS — J069 Acute upper respiratory infection, unspecified: Secondary | ICD-10-CM | POA: Diagnosis not present

## 2021-04-17 NOTE — Progress Notes (Signed)
Results discussed with patient during visit.

## 2021-04-17 NOTE — Telephone Encounter (Signed)
°  Chief Complaint: headache Symptoms: headache, sore throat, body aches Frequency: started yesterday Pertinent Negatives: Patient denies fever. Disposition: [] ED /[] Urgent Care (no appt availability in office) / [x] Appointment(In office/virtual)/ []  [facility]/ [] Home Care/ [] Refused Recommended Disposition /[] [facility]/ []  Follow-up with PCP Additional Notes: Patient advised home COVID test due to symptom profile- advised to call back if + COVID.    Reason for Disposition  [1] SEVERE headache (e.g., excruciating) AND [2] not improved after 2 hours of pain medicine  Answer Assessment - Initial Assessment Questions 1. LOCATION: "Where does it hurt?"      forehead 2. ONSET: "When did the headache start?" (Minutes, hours or days)      Started yesterday 3. PATTERN: "Does the pain come and go, or has it been constant since it started?"     constant 4. SEVERITY: "How bad is the pain?" and "What does it keep you from doing?"  (e.g., Scale 1-10; mild, moderate, or severe)   - MILD (1-3): doesn't interfere with normal activities    - MODERATE (4-7): interferes with normal activities or awakens from sleep    - SEVERE (8-10): excruciating pain, unable to do any normal activities        severe 5. RECURRENT SYMPTOM: "Have you ever had headaches before?" If Yes, ask: "When was the last time?" and "What happened that time?"      Sometimes- migraine- but does not continue 6. CAUSE: "What do you think is causing the headache?"     Not sure  7. MIGRAINE: "Have you been diagnosed with migraine headaches?" If Yes, ask: "Is this headache similar?"      Yes- usually goes away 8. HEAD INJURY: "Has there been any recent injury to the head?"      no 9. OTHER SYMPTOMS: "Do you have any other symptoms?" (fever, stiff neck, eye pain, sore throat, cold symptoms)     Sore throat 10. PREGNANCY: "Is there any chance you are pregnant?" "When was your last menstrual period?"       *No  Answer*  Protocols used: Headache-A-AH

## 2021-04-17 NOTE — Progress Notes (Signed)
BP 96/62    Pulse 76    Temp 97.9 F (36.6 C) (Oral)    Wt 154 lb 6.4 oz (70 kg)    SpO2 98%    BMI 26.49 kg/m    Subjective:    Patient ID: Karen Mess., female    DOB: 07-14-1970, 51 y.o.   MRN: [id number]  HPI: Karen Walker is a 51 y.o. female  Chief Complaint  Patient presents with   URI    Pt states she has had a sore throat and headache since yesterday.    UPPER RESPIRATORY TRACT INFECTION Worst symptom: symptoms started yesterday Fever: no Cough: no Shortness of breath: no Wheezing: no Chest pain: no Chest tightness: yes Chest congestion: yes Nasal congestion: no Runny nose: no Post nasal drip: no Sneezing: no Sore throat: yes Swollen glands: yes Sinus pressure: no Headache: yes Face pain: no Toothache: no Ear pain: no bilateral Ear pressure: yes bilateral Eyes red/itching:no Eye drainage/crusting: no  Vomiting: yes- last night Rash: no Fatigue: yes Sick contacts: no Strep contacts: no  Context: stable Recurrent sinusitis: no Relief with OTC cold/cough medications: no  Treatments attempted: none   Relevant past medical, surgical, family and social history reviewed and updated as indicated. Interim medical history since our last visit reviewed. Allergies and medications reviewed and updated.  Review of Systems  Constitutional:  Positive for fatigue. Negative for fever.  HENT:  Positive for sore throat. Negative for congestion, dental problem, ear pain, postnasal drip, rhinorrhea, sinus pressure, sinus pain and sneezing.   Respiratory:  Negative for cough, shortness of breath and wheezing.   Cardiovascular:  Negative for chest pain.  Gastrointestinal:  Positive for vomiting.  Skin:  Negative for rash.  Neurological:  Positive for headaches.   Per HPI unless specifically indicated above     Objective:    BP 96/62    Pulse 76    Temp 97.9 F (36.6 C) (Oral)    Wt 154 lb 6.4 oz (70 kg)    SpO2 98%    BMI 26.49 kg/m   Wt Readings from Last  3 Encounters:  04/17/21 154 lb 6.4 oz (70 kg)  01/09/21 156 lb 3.2 oz (70.9 kg)  12/13/20 155 lb 9.6 oz (70.6 kg)    Physical Exam Vitals and nursing note reviewed.  Constitutional:      General: She is not in acute distress.    Appearance: Normal appearance. She is normal weight. She is ill-appearing. She is not toxic-appearing or diaphoretic.  HENT:     Head: Normocephalic.     Right Ear: External ear normal.     Left Ear: External ear normal.     Nose: Nose normal. No congestion or rhinorrhea.     Right Sinus: No maxillary sinus tenderness or frontal sinus tenderness.     Left Sinus: No maxillary sinus tenderness or frontal sinus tenderness.     Mouth/Throat:     Mouth: Mucous membranes are moist.     Pharynx: Oropharynx is clear. Posterior oropharyngeal erythema present. No oropharyngeal exudate.  Eyes:     General:        Right eye: No discharge.        Left eye: No discharge.     Extraocular Movements: Extraocular movements intact.     Conjunctiva/sclera: Conjunctivae normal.     Pupils: Pupils are equal, round, and reactive to light.  Cardiovascular:     Rate and Rhythm: Normal rate and regular rhythm.  Heart sounds: No murmur heard. Pulmonary:     Effort: Pulmonary effort is normal. No respiratory distress.     Breath sounds: Normal breath sounds. No wheezing or rales.  Musculoskeletal:     Cervical back: Normal range of motion and neck supple.  Skin:    General: Skin is warm and dry.     Capillary Refill: Capillary refill takes less than 2 seconds.  Neurological:     General: No focal deficit present.     Mental Status: She is alert and oriented to person, place, and time. Mental status is at baseline.  Psychiatric:        Mood and Affect: Mood normal.        Behavior: Behavior normal.        Thought Content: Thought content normal.        Judgment: Judgment normal.    Results for orders placed or performed in visit on 04/17/21  Rapid Strep  screen([facility])   Specimen: Other   Other  Result Value Ref Range   Strep Gp A Ag, IA W/Reflex Negative Negative  Culture, Group A Strep   Other  Result Value Ref Range   Strep A Culture WILL FOLLOW   Veritor Flu A/B Waived  Result Value Ref Range   Influenza A Negative Negative   Influenza B Negative Negative      Assessment & Plan:   Problem List Items Addressed This Visit   None Visit Diagnoses     Upper respiratory tract infection, unspecified type    -  Primary   Flu and Strep are neg. COVID sent out. Discussed symptomatic management. Follow up if symptoms worsen or fail to improve.    Sore throat       Relevant Orders   Rapid Strep screen([facility]) (Completed)   Veritor Flu A/B Waived (Completed)   Novel Coronavirus, NAA ([facility])        Follow up plan: Return if symptoms worsen or fail to improve.

## 2021-04-18 LAB — NOVEL CORONAVIRUS, NAA: SARS-CoV-2, NAA: NOT DETECTED

## 2021-04-18 LAB — SARS-COV-2, NAA 2 DAY TAT

## 2021-04-18 NOTE — Progress Notes (Signed)
Hi Jnai.  Your COVID test was negative.

## 2021-04-20 LAB — CULTURE, GROUP A STREP: Strep A Culture: NEGATIVE

## 2021-04-20 LAB — RAPID STREP SCREEN (MED CTR MEBANE ONLY): Strep Gp A Ag, IA W/Reflex: NEGATIVE

## 2021-04-20 LAB — VERITOR FLU A/B WAIVED
Influenza A: NEGATIVE
Influenza B: NEGATIVE

## 2021-04-23 DIAGNOSIS — M25432 Effusion, left wrist: Secondary | ICD-10-CM | POA: Diagnosis not present

## 2021-04-23 DIAGNOSIS — M25532 Pain in left wrist: Secondary | ICD-10-CM | POA: Diagnosis not present

## 2021-04-23 DIAGNOSIS — M654 Radial styloid tenosynovitis [de Quervain]: Secondary | ICD-10-CM | POA: Diagnosis not present

## 2021-04-23 DIAGNOSIS — M65849 Other synovitis and tenosynovitis, unspecified hand: Secondary | ICD-10-CM | POA: Diagnosis not present

## 2021-05-27 DIAGNOSIS — M654 Radial styloid tenosynovitis [de Quervain]: Secondary | ICD-10-CM | POA: Diagnosis not present

## 2021-06-10 ENCOUNTER — Other Ambulatory Visit: Payer: Self-pay

## 2021-06-10 MED ORDER — PREDNISONE 10 MG PO TABS
ORAL_TABLET | ORAL | 0 refills | Status: DC
  Filled 2021-06-10: qty 21, 6d supply, fill #0

## 2021-07-03 IMAGING — MR MR LUMBAR SPINE W/O CM
5 series · 31 of 48 positions shown · non-contrast
Comparison: Lumbar spine x-rays dated September 30, 2018.

CLINICAL DATA: Right-sided low back pain for the past year.

EXAM:
MRI LUMBAR SPINE WITHOUT CONTRAST
TECHNIQUE: Multiplanar, multisequence MR imaging of the lumbar spine was
performed. No intravenous contrast was administered.

[Series 5: T2 · sagittal · 4.0mm · 0.81mm/px · 6 of 15 slices shown (1 of 2)]
[im 1/15]
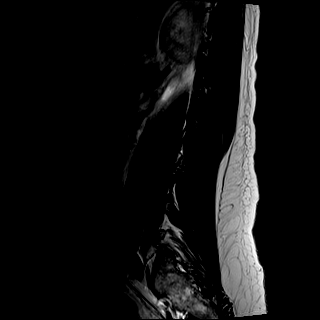
[im 3/15]
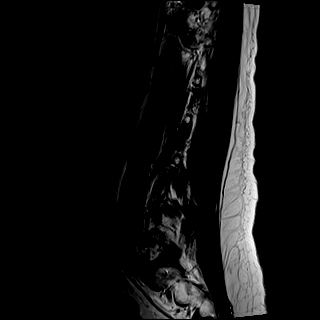
[im 6/15]
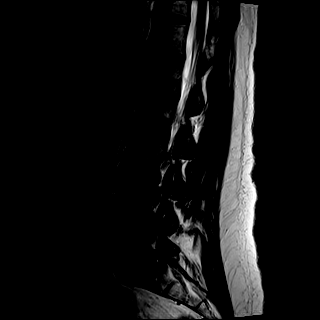
[im 9/15]
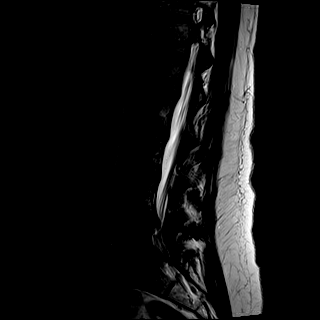
[im 12/15]
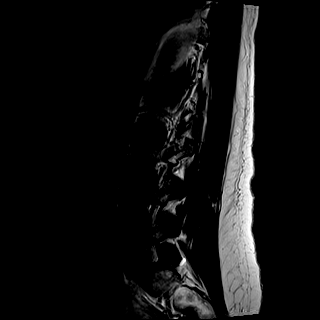
[im 15/15]
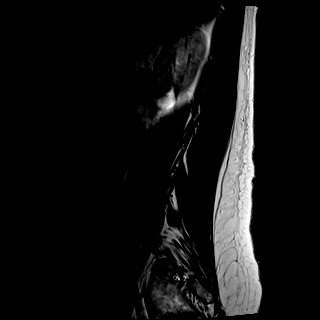

[Series 6: T1 · sagittal · 4.0mm · 0.81mm/px · 6 of 15 slices shown (1 of 2)]
[im 1/15]
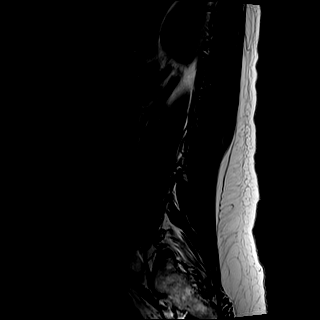
[im 3/15]
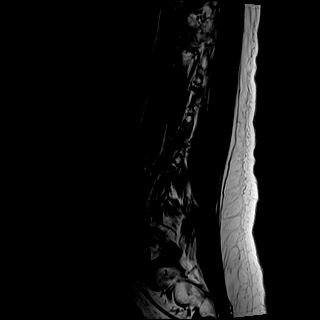
[im 6/15]
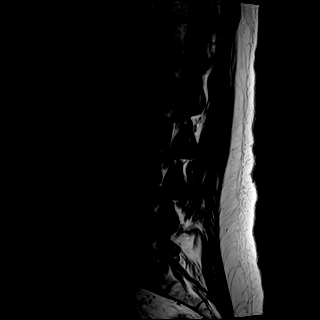
[im 9/15]
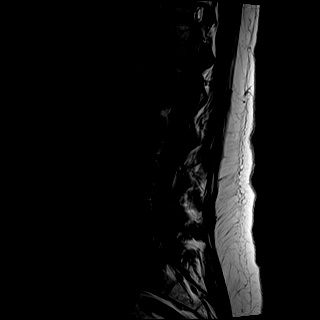
[im 12/15]
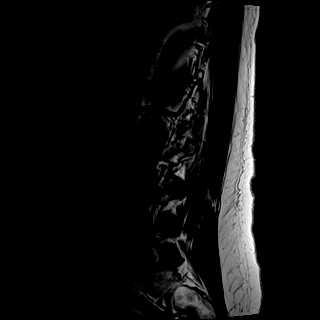
[im 15/15]
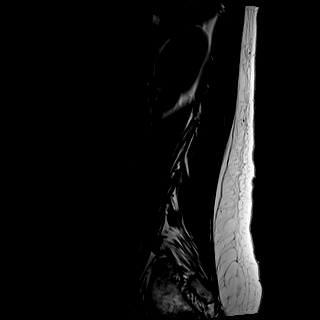

[Series 7: STIR · sagittal · 4.0mm · 0.41mm/px · 1 of 15 slices shown]
[im 1/15]
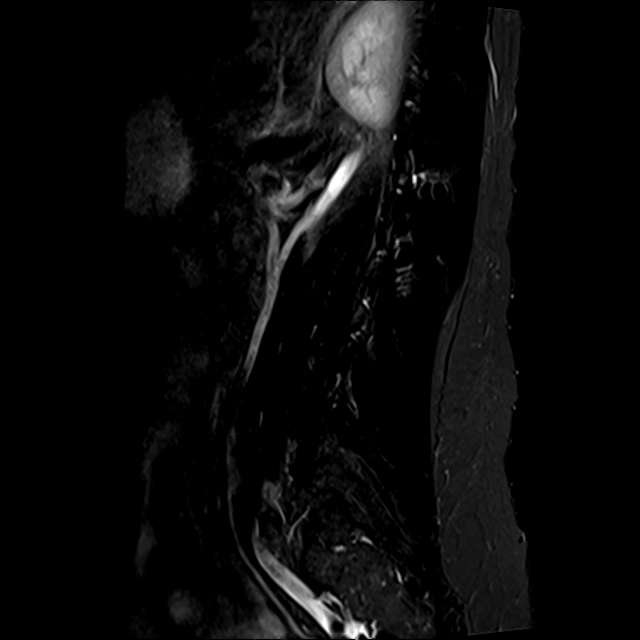

[Series 8: T2 · axial · 4.0mm · 0.78mm/px · z∈[-79,+134]mm · 9 of 36 slices shown (2 of 2)]
[im 1/36]
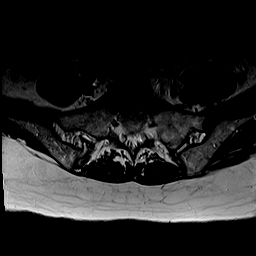
[im 6/36]
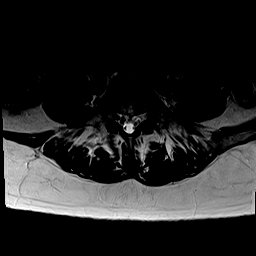
[im 11/36]
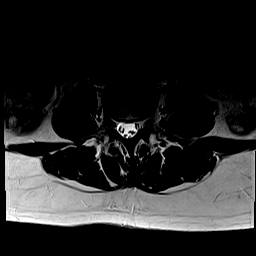
[im 16/36]
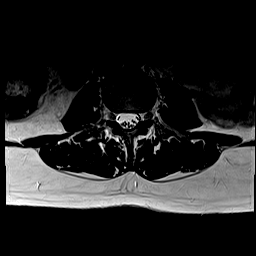
[im 18/36]
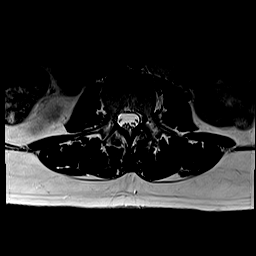
[im 21/36]
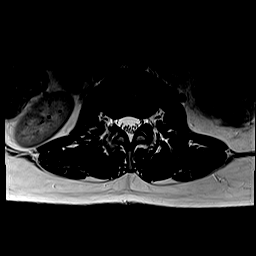
[im 26/36]
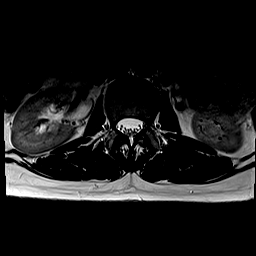
[im 31/36]
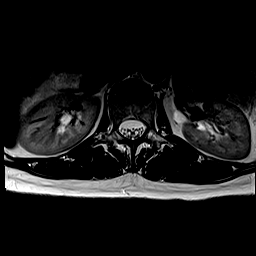
[im 36/36]
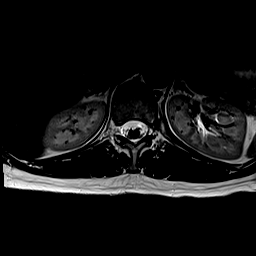

[Series 9: T1 · axial · 4.0mm · 0.39mm/px · z∈[-79,+134]mm · 9 of 36 slices shown (2 of 2)]
[im 1/36]
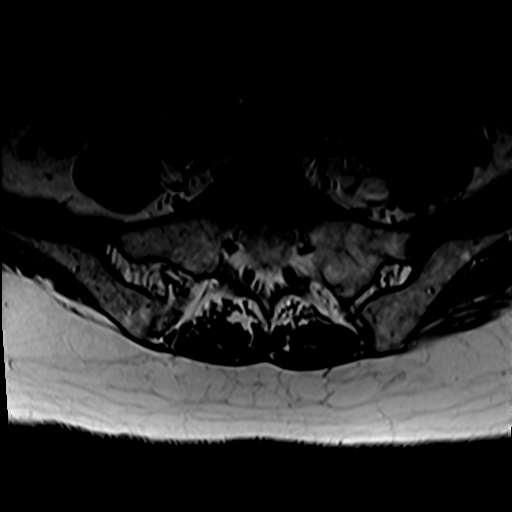
[im 6/36]
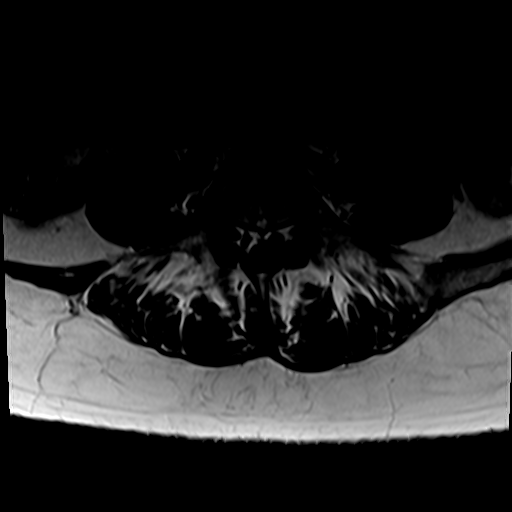
[im 11/36]
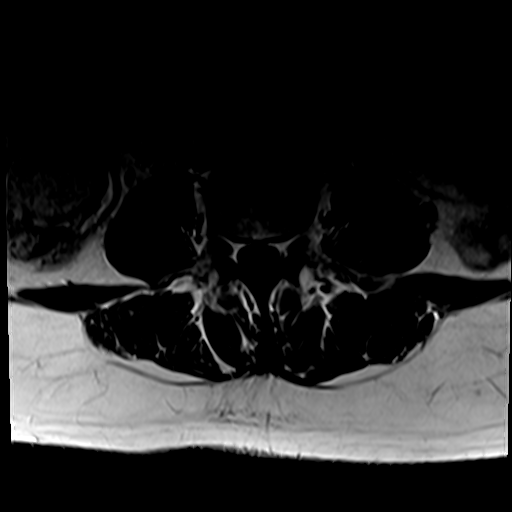
[im 16/36]
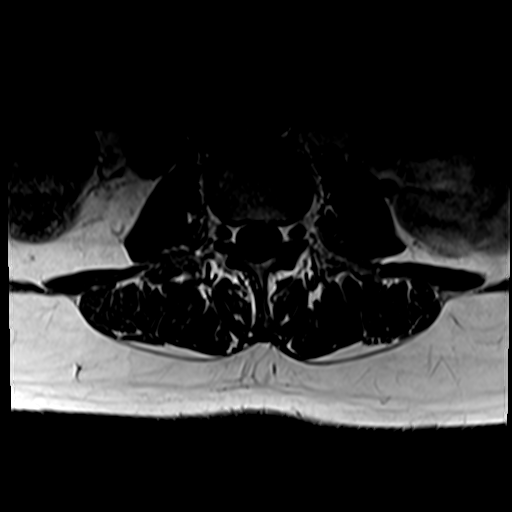
[im 18/36]
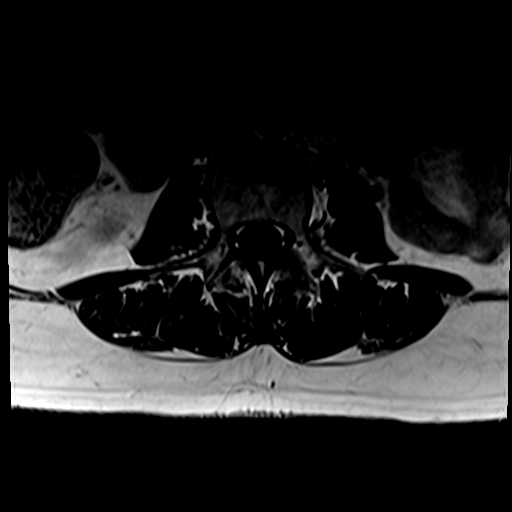
[im 21/36]
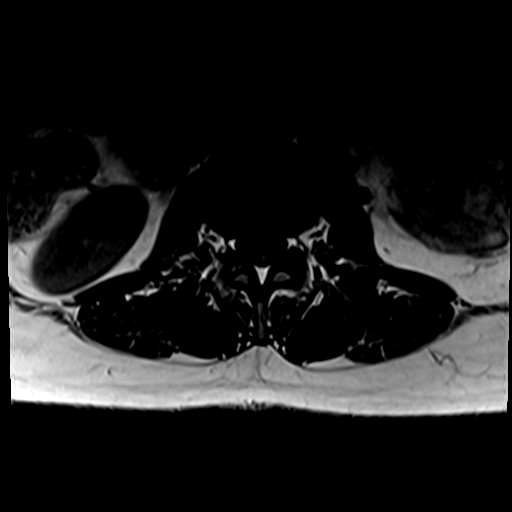
[im 26/36]
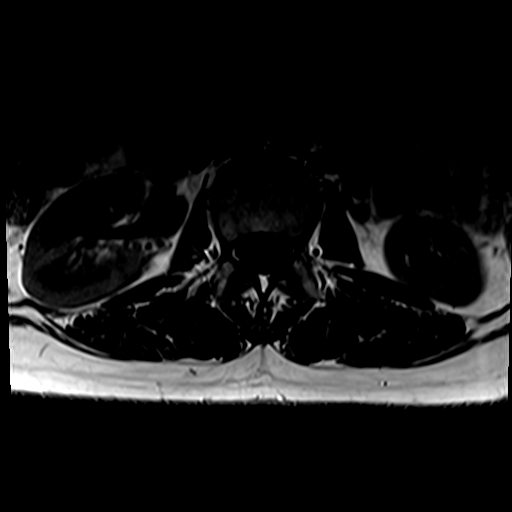
[im 31/36]
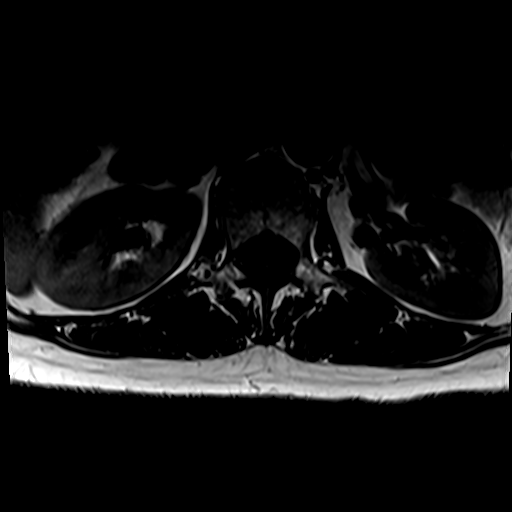
[im 36/36]
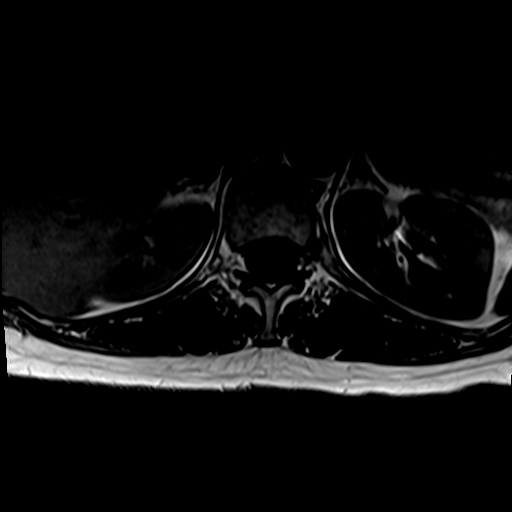

[31 of 48 positions shown; findings below may reference images not displayed]

FINDINGS: Segmentation:  Standard.

Alignment:  Physiologic.

Vertebrae:  No fracture, evidence of discitis, or bone lesion.

Conus medullaris and cauda equina: Conus extends to the L1 level.
Conus and cauda equina appear normal.

Paraspinal and other soft tissues: Negative.

Disc levels:

T12-L1 to L2-L3:  Negative.

L3-L4:  Minimal disc bulging.  No stenosis.

L4-L5: Shallow broad-based posterior disc protrusion with
superimposed right subarticular disc protrusion impinging on the
descending right L5 and S1 nerve roots, which may be conjoined. Mild
spinal canal stenosis. Moderate right and mild left lateral recess
stenosis. No neuroforaminal stenosis.

L5-S1: Shallow broad-based posterior disc protrusion with
superimposed right paracentral and subarticular disc protrusion.
Mild mass effect on the descending right S1 nerve root. Mild right
lateral recess and neuroforaminal stenosis. No spinal canal or left
neuroforaminal stenosis.
IMPRESSION: 1. Right-sided disc protrusions at L4-L5 and L5-S1 with mass effect
on the descending right L5 and S1 nerve roots, which may be
conjoined.
2. Mild spinal canal stenosis at L4-L5.

## 2021-08-01 ENCOUNTER — Encounter: Payer: Self-pay | Admitting: Dermatology

## 2021-08-01 ENCOUNTER — Ambulatory Visit (INDEPENDENT_AMBULATORY_CARE_PROVIDER_SITE_OTHER): Payer: 59 | Admitting: Dermatology

## 2021-08-01 DIAGNOSIS — I8393 Asymptomatic varicose veins of bilateral lower extremities: Secondary | ICD-10-CM | POA: Diagnosis not present

## 2021-08-01 DIAGNOSIS — Z1283 Encounter for screening for malignant neoplasm of skin: Secondary | ICD-10-CM

## 2021-08-01 DIAGNOSIS — L578 Other skin changes due to chronic exposure to nonionizing radiation: Secondary | ICD-10-CM | POA: Diagnosis not present

## 2021-08-01 DIAGNOSIS — Z86018 Personal history of other benign neoplasm: Secondary | ICD-10-CM | POA: Diagnosis not present

## 2021-08-01 DIAGNOSIS — D229 Melanocytic nevi, unspecified: Secondary | ICD-10-CM | POA: Diagnosis not present

## 2021-08-01 DIAGNOSIS — D1800 Hemangioma unspecified site: Secondary | ICD-10-CM

## 2021-08-01 DIAGNOSIS — L821 Other seborrheic keratosis: Secondary | ICD-10-CM

## 2021-08-01 DIAGNOSIS — D2239 Melanocytic nevi of other parts of face: Secondary | ICD-10-CM | POA: Diagnosis not present

## 2021-08-01 DIAGNOSIS — L814 Other melanin hyperpigmentation: Secondary | ICD-10-CM | POA: Diagnosis not present

## 2021-08-01 NOTE — Patient Instructions (Addendum)
Recommend daily broad spectrum sunscreen SPF 30+ to sun-exposed areas, reapply every 2 hours as needed. Call for new or changing lesions.  ?Staying in the shade or wearing long sleeves, sun glasses (UVA+UVB protection) and wide brim hats (4-inch brim around the entire circumference of the hat) are also recommended for sun protection.  ? ? ?BEFORE YOUR APPOINTMENT FOR SCLEROTHERAPY ? ?1. When you telephone for your appointment for the sclerotherapy procedure, please let the receptionist know that you are scheduling for the fifteen (15) minute sclerotherapy procedure not just a regular visit. ? ?2. On the day of the procedure, please cleanse and dry the areas, but do not use any moisturizers or other products on the area(s) to be treated. ? ?3. Bring a pair of comfortable shorts to wear during the procedure. ? ?4. Be sure to bring your recommended graduated compression stockings with you to the office. You will be wearing them home when your visit is over. These compression hose can be purchased at most medical supply stores. ? ?After Your Sclerotherapy Procedure ? ?1. Please wear the graduated compression stockings for 24 hours immediately following the completion of the sclerotherapy procedure. ? ?2. We recommend that you avoid vigorous activity as much as possible for the first twenty-four (24) hours. You can do your "normal" routine, but avoid an above normal amount of time on your feet. Elevating the legs when sitting and avoidance of vigorous leg movements or exercise in the first few days after treatment may improve your results. ? ?3. You may remove the compression dressings (cotton balls) and tape the next morning. ? ?4. Please continue wearing the compression stockings during waking hours for the two (2) weeks following sclerotherapy. ? ?5. If you have any blisters, sores or ulcers or other problems following your procedure please call or return to the office immediately.  ? ? ? ?THE PROCEDURE FEE IS $350.00  PER FIFTEEN (15) MINUTE SESSION. WE REQUIRE THAT THIS PROCEDURE BE PAID FOR IN FULL ON OR BEFORE THE DATE THAT IT IS PERFORMED. WE WILL GIVE YOU A RECEIPT THAT YOU CAN FILE WITH YOUR INSURANCE COMPANY. WE GENERALLY DO NOT FILE THIS PROCEDURE WITH ANY INSURANCE COMPANY EXCEPT UNDER CERTAIN CIRCUMSTANCES WHERE PRIOR AUTHORIZATION HAS BEEN CONFIRMED. THIS PROCEDURE IS GENERALLY CONSIDERED TO BE A COSMETIC PROCEDURE BY INSURANCE COMPANIES. ? ? ? ?Melanoma ABCDEs ? ?Melanoma is the most dangerous type of skin cancer, and is the leading cause of death from skin disease.  You are more likely to develop melanoma if you: ?Have light-colored skin, light-colored eyes, or red or blond hair ?Spend a lot of time in the sun ?Tan regularly, either outdoors or in a tanning bed ?Have had blistering sunburns, especially during childhood ?Have a close family member who has had a melanoma ?Have atypical moles or large birthmarks ? ?Early detection of melanoma is key since treatment is typically straightforward and cure rates are extremely high if we catch it early.  ? ?The first sign of melanoma is often a change in a mole or a new dark spot.  The ABCDE system is a way of remembering the signs of melanoma. ? ?A for asymmetry:  The two halves do not match. ?B for border:  The edges of the growth are irregular. ?C for color:  A mixture of colors are present instead of an even brown color. ?D for diameter:  Melanomas are usually (but not always) greater than 19m - the size of a pencil eraser. ?E for evolution:  The spot keeps changing in size, shape, and color. ? ?Please check your skin once per month between visits. You can use a small mirror in front and a large mirror behind you to keep an eye on the back side or your body.  ? ?If you see any new or changing lesions before your next follow-up, please call to schedule a visit. ? ?Please continue daily skin protection including broad spectrum sunscreen SPF 30+ to sun-exposed areas,  reapplying every 2 hours as needed when you're outdoors.  ? ?Staying in the shade or wearing long sleeves, sun glasses (UVA+UVB protection) and wide brim hats (4-inch brim around the entire circumference of the hat) are also recommended for sun protection.   ? ? ?If You Need Anything After Your Visit ? ?If you have any questions or concerns for your doctor, please call our main line at [phone] and press option 4 to reach your doctor's medical assistant. If no one answers, please leave a voicemail as directed and we will return your call as soon as possible. Messages left after 4 pm will be answered the following business day.  ? ?You may also send [place] a message via MyChart. We typically respond to MyChart messages within 1-2 business days. ? ?For prescription refills, please ask your pharmacy to contact our office. Our fax number is [phone]. ? ?If you have an urgent issue when the clinic is closed that cannot wait until the next business day, you can page your doctor at the number below.   ? ?Please note that while we do our best to be available for urgent issues outside of office hours, we are not available 24/7.  ? ?If you have an urgent issue and are unable to reach [place], you may choose to seek medical care at your doctor's office, retail clinic, urgent care center, or emergency room. ? ?If you have a medical emergency, please immediately call 911 or go to the emergency department. ? ?Pager Numbers ? ?- Dr. Nehemiah Massed: [phone] ? ?- Dr. Laurence Ferrari: [phone] ? ?- Dr. Nicole Kindred: [phone] ? ?In the event of inclement weather, please call our main line at [phone] for an update on the status of any delays or closures. ? ?Dermatology Medication Tips: ?Please keep the boxes that topical medications come in in order to help keep track of the instructions about where and how to use these. Pharmacies typically print the medication instructions only on the boxes and not directly on the medication tubes.  ? ?If  your medication is too expensive, please contact our office at [phone] option 4 or send [place] a message through [place].  ? ?We are unable to tell what your co-pay for medications will be in advance as this is different depending on your insurance coverage. However, we may be able to find a substitute medication at lower cost or fill out paperwork to get insurance to cover a needed medication.  ? ?If a prior authorization is required to get your medication covered by your insurance company, please allow [place] 1-2 business days to complete this process. ? ?Drug prices often vary depending on where the prescription is filled and some pharmacies may offer cheaper prices. ? ?The website www.goodrx.com contains coupons for medications through different pharmacies. The prices here do not account for what the cost may be with help from insurance (it may be cheaper with your insurance), but the website can give you the price if you did not use any insurance.  ?- You can print the  associated coupon and take it with your prescription to the pharmacy.  ?- You may also stop by our office during regular business hours and pick up a GoodRx coupon card.  ?- If you need your prescription sent electronically to a different pharmacy, notify our office through [facility] or by phone at [phone] option 4. ? ? ? ? ?Si Usted Necesita Algo Despu?s de Su Visita ? ?Tambi?n puede enviarnos un mensaje a trav?s de MyChart. Por lo general respondemos a los mensajes de MyChart en el transcurso de 1 a 2 d?as h?biles. ? ?Para renovar recetas, por favor pida a su farmacia que se ponga en contacto con nuestra oficina. Nuestro n?mero de fax es el [phone]. ? ?Si tiene un asunto urgente cuando la cl?nica est? cerrada y que no puede esperar hasta el siguiente d?a h?bil, puede llamar/localizar a su doctor(a) al n?mero que aparece a continuaci?n.  ? ?Por favor, tenga en cuenta que aunque hacemos todo lo posible para estar disponibles para  asuntos urgentes fuera del horario de oficina, no estamos disponibles las 24 horas del d?a, los 7 d?as de la semana.  ? ?Si tiene un problema urgente y no puede comunicarse con nosotros, puede optar por

## 2021-08-01 NOTE — Progress Notes (Signed)
? ?Follow-Up Visit ?  ?Subjective  ?Karen Holding V. is a 51 y.o. female who presents for the following: Annual Exam (Skin cancer screening. Full body. HxDN, severe atypia. C/O of lesions on chest that feel like one is under the skin and the other is raised. Dur: several months). ?The patient presents for Total-Body Skin Exam (TBSE) for skin cancer screening and mole check.  The patient has spots, moles and lesions to be evaluated, some may be new or changing and the patient has concerns that these could be cancer. ? ?The following portions of the chart were reviewed this encounter and updated as appropriate:  Tobacco  Allergies  Meds  Problems  Med Hx  Surg Hx  Fam Hx   ?  ?Review of Systems: No other skin or systemic complaints except as noted in HPI or Assessment and Plan. ? ?Objective  ?Well appearing patient in no apparent distress; mood and affect are within normal limits. ? ?A full examination was performed including scalp, head, eyes, ears, nose, lips, neck, chest, axillae, abdomen, back, buttocks, bilateral upper extremities, bilateral lower extremities, hands, feet, fingers, toes, fingernails, and toenails. All findings within normal limits unless otherwise noted below. ? ?Left Melolabial Fold ?Light brown-flesh colored papule ? ? ?Assessment & Plan  ? ?History of Dysplastic Nevus - 02/14/2020-R foot inf to her med maleolus (mod to severe - close to margin)  ?- No evidence of recurrence today ?- Recommend regular full body skin exams ?- Recommend daily broad spectrum sunscreen SPF 30+ to sun-exposed areas, reapply every 2 hours as needed.  ?- Call if any new or changing lesions are noted between office visits ? ?Lentigines ?- Scattered tan macules ?- Due to sun exposure ?- Benign-appearing, observe ?- Recommend daily broad spectrum sunscreen SPF 30+ to sun-exposed areas, reapply every 2 hours as needed. ?- Call for any changes ? ?Seborrheic Keratoses ?- Stuck-on, waxy, tan-brown papules and/or plaques   ?- Benign-appearing ?- Discussed benign etiology and prognosis. ?- Observe ?- Call for any changes ? ?Melanocytic Nevi ?- Tan-brown and/or pink-flesh-colored symmetric macules and papules ?- Benign appearing on exam today ?- Observation ?- Call clinic for new or changing moles ?- Recommend daily use of broad spectrum spf 30+ sunscreen to sun-exposed areas.  ? ?Hemangiomas ?- Red papules ?- Discussed benign nature ?- Observe ?- Call for any changes ? ?Actinic Damage ?- Chronic condition, secondary to cumulative UV/sun exposure ?- diffuse scaly erythematous macules with underlying dyspigmentation ?- Recommend daily broad spectrum sunscreen SPF 30+ to sun-exposed areas, reapply every 2 hours as needed.  ?- Staying in the shade or wearing long sleeves, sun glasses (UVA+UVB protection) and wide brim hats (4-inch brim around the entire circumference of the hat) are also recommended for sun protection.  ?- Call for new or changing lesions. ? ?Skin cancer screening performed today. ? ?Varicose Veins/Spider Veins ?- Dilated blue, purple or red veins at the lower extremities ?- Reassured ?- Smaller vessels can be treated by sclerotherapy (a procedure to inject a medicine into the veins to make them disappear) if desired, but the treatment is not covered by insurance. Larger vessels may be covered if symptomatic and we would refer to vascular surgeon if treatment desired. ? ?Discussed sclerotherapy for spider vein treatment.  Discussed that it is a cosmetic procedure and is not covered by insurance ($350/treatment).  Treatment consists of injecting the spider veins with a medicine called Asclera to help them disappear.  Multiple treatments are generally necessary to get best  results.  Risks including bruising and persistent discoloration due to post-inflammatory hyperpigmentation.  Sclerotherapy does not prevent the development of new spider veins.  Daily compression hose for two weeks after procedure is recommended.   ?Asclera handout given. ? ?Nevus ?Left Melolabial Fold ?Benign-appearing.  Observation.  Call clinic for new or changing lesions.  Recommend daily use of broad spectrum spf 30+ sunscreen to sun-exposed areas.  ? ?Do not recommend removal due to minimal profile and chance of scarring being more prominent than the mole itself..  ? ?Return in about 1 year (around 08/02/2022) for TBSE, HxDN. ? ?I, Emelia Salisbury, CMA, am acting as scribe for Sarina Ser, MD. ?Documentation: I have reviewed the above documentation for accuracy and completeness, and I agree with the above. ? ?Sarina Ser, MD ? ? ?

## 2021-08-13 ENCOUNTER — Encounter: Payer: Self-pay | Admitting: Dermatology

## 2021-11-25 ENCOUNTER — Encounter: Payer: Self-pay | Admitting: Family Medicine

## 2021-11-25 ENCOUNTER — Ambulatory Visit: Payer: 59 | Admitting: Family Medicine

## 2021-11-25 ENCOUNTER — Other Ambulatory Visit: Payer: Self-pay

## 2021-11-25 ENCOUNTER — Ambulatory Visit
Admission: RE | Admit: 2021-11-25 | Discharge: 2021-11-25 | Disposition: A | Discharge status: Home / Self Care | Payer: 59 | Attending: Family Medicine | Admitting: Family Medicine

## 2021-11-25 ENCOUNTER — Ambulatory Visit
Admission: RE | Admit: 2021-11-25 | Discharge: 2021-11-25 | Disposition: A | Discharge status: Home / Self Care | Payer: 59 | Source: Ambulatory Visit | Attending: Family Medicine | Admitting: Family Medicine

## 2021-11-25 ENCOUNTER — Ambulatory Visit: Payer: Self-pay

## 2021-11-25 VITALS — BP 94/60 | HR 81 | Temp 98.0°F | Wt 158.0 lb

## 2021-11-25 DIAGNOSIS — M549 Dorsalgia, unspecified: Secondary | ICD-10-CM

## 2021-11-25 DIAGNOSIS — M545 Low back pain, unspecified: Secondary | ICD-10-CM | POA: Insufficient documentation

## 2021-11-25 DIAGNOSIS — M47816 Spondylosis without myelopathy or radiculopathy, lumbar region: Secondary | ICD-10-CM | POA: Diagnosis not present

## 2021-11-25 DIAGNOSIS — M5134 Other intervertebral disc degeneration, thoracic region: Secondary | ICD-10-CM | POA: Insufficient documentation

## 2021-11-25 MED ORDER — TRIAMCINOLONE ACETONIDE 40 MG/ML IJ SUSP
40.0000 mg | Freq: Once | INTRAMUSCULAR | Status: AC
  Administered 2021-11-25: 40 mg via INTRAMUSCULAR

## 2021-11-25 MED ORDER — PREDNISONE 10 MG PO TABS
ORAL_TABLET | ORAL | 0 refills | Status: DC
  Filled 2021-11-25: qty 42, 12d supply, fill #0

## 2021-11-25 MED ORDER — CYCLOBENZAPRINE HCL 10 MG PO TABS
10.0000 mg | ORAL_TABLET | Freq: Every day | ORAL | 0 refills | Status: DC
  Filled 2021-11-25: qty 30, 30d supply, fill #0

## 2021-11-25 NOTE — Progress Notes (Signed)
BP 94/60   Pulse 81   Temp 98 F (36.7 C)   Wt 158 lb (71.7 kg)   SpO2 97%   BMI 27.11 kg/m    Subjective:    Patient ID: Karen Walker., female    DOB: 1970/06/25, 51 y.o.   MRN: [id number]  HPI: Karen Walker is a 51 y.o. female  Chief Complaint  Patient presents with   Back Pain    Patient states she has been having right sided back pain. Patient noticed about 2 months she hurt her back at work, has noticed that last week pain started getting worse. Pain is now radiating to her neck and causing headaches, also radiated down her left leg.    BACK PAIN Duration: 2 months, worse on Friday Mechanism of injury: 2 months ago, got hurt at work- was pushing a cart and moved it out of the way and it has been actin gup Location: R>L, low back, and upper back Onset: sudden Severity: moderate Quality: aching  Frequency: constant Radiation: neck and head and R leg Aggravating factors: push, movement Alleviating factors: nothing Status: worse Treatments attempted: rest, ice, heat, APAP, and ibuprofen  Relief with NSAIDs?: mild Nighttime pain:  no Paresthesias / decreased sensation:  no Bowel / bladder incontinence:  no Fevers:  no Dysuria / urinary frequency:  no  Relevant past medical, surgical, family and social history reviewed and updated as indicated. Interim medical history since our last visit reviewed. Allergies and medications reviewed and updated.  Review of Systems  Constitutional: Negative.   Respiratory: Negative.    Cardiovascular: Negative.   Gastrointestinal: Negative.   Musculoskeletal:  Positive for back pain and myalgias. Negative for arthralgias, gait problem, joint swelling, neck pain and neck stiffness.  Skin: Negative.   Neurological: Negative.   Psychiatric/Behavioral: Negative.      Per HPI unless specifically indicated above     Objective:    BP 94/60   Pulse 81   Temp 98 F (36.7 C)   Wt 158 lb (71.7 kg)   SpO2 97%   BMI 27.11  kg/m   Wt Readings from Last 3 Encounters:  11/25/21 158 lb (71.7 kg)  04/17/21 154 lb 6.4 oz (70 kg)  01/09/21 156 lb 3.2 oz (70.9 kg)    Physical Exam Vitals and nursing note reviewed.  Constitutional:      General: She is not in acute distress.    Appearance: Normal appearance. She is normal weight. She is not ill-appearing, toxic-appearing or diaphoretic.  HENT:     Head: Normocephalic and atraumatic.     Right Ear: External ear normal.     Left Ear: External ear normal.     Nose: Nose normal.     Mouth/Throat:     Mouth: Mucous membranes are moist.     Pharynx: Oropharynx is clear.  Eyes:     General: No scleral icterus.       Right eye: No discharge.        Left eye: No discharge.     Extraocular Movements: Extraocular movements intact.     Conjunctiva/sclera: Conjunctivae normal.     Pupils: Pupils are equal, round, and reactive to light.  Cardiovascular:     Rate and Rhythm: Normal rate and regular rhythm.     Pulses: Normal pulses.     Heart sounds: Normal heart sounds. No murmur heard.    No friction rub. No gallop.  Pulmonary:     Effort: Pulmonary effort  is normal. No respiratory distress.     Breath sounds: Normal breath sounds. No stridor. No wheezing, rhonchi or rales.  Chest:     Chest wall: No tenderness.  Musculoskeletal:        General: Normal range of motion.     Cervical back: Normal range of motion and neck supple.  Skin:    General: Skin is warm and dry.     Capillary Refill: Capillary refill takes less than 2 seconds.     Coloration: Skin is not jaundiced or pale.     Findings: No bruising, erythema, lesion or rash.  Neurological:     General: No focal deficit present.     Mental Status: She is alert and oriented to person, place, and time. Mental status is at baseline.  Psychiatric:        Mood and Affect: Mood normal.        Behavior: Behavior normal.        Thought Content: Thought content normal.        Judgment: Judgment normal.      Results for orders placed or performed in visit on 04/17/21  Rapid Strep screen([facility])   Specimen: Other   Other  Result Value Ref Range   Strep Gp A Ag, IA W/Reflex Negative Negative  Novel Coronavirus, NAA ([facility])   Specimen: Nasopharyngeal(NP) swabs in vial transport medium  Result Value Ref Range   SARS-CoV-2, NAA Not Detected Not Detected  Culture, Group A Strep   Other  Result Value Ref Range   Strep A Culture Negative   SARS-COV-2, NAA 2 DAY TAT  Result Value Ref Range   SARS-CoV-2, NAA 2 DAY TAT Performed   Veritor Flu A/B Waived  Result Value Ref Range   Influenza A Negative Negative   Influenza B Negative Negative      Assessment & Plan:   Problem List Items Addressed This Visit   None Visit Diagnoses     Acute right-sided back pain, unspecified back location    -  Primary   Will treat with triamcinalone, prednisone and flexeril. Will get her into PT and get x-ray. Call with any concerns.   Relevant Medications   predniSONE (DELTASONE) 10 MG tablet   cyclobenzaprine (FLEXERIL) 10 MG tablet   triamcinolone acetonide (KENALOG-40) injection 40 mg (Completed)   Other Relevant Orders   DG Lumbar Spine Complete   DG Thoracic Spine W/Swimmers   Ambulatory referral to Physical Therapy        Follow up plan: Return if symptoms worsen or fail to improve.

## 2021-11-25 NOTE — Telephone Encounter (Signed)
     Chief Complaint: Right side back pain, radiates to legs and neck. Symptoms: Above Frequency: 1 week ago Pertinent Negatives: Patient denies weakness Disposition: '[]'$ ED /'[]'$ Urgent Care (no appt availability in office) / '[x]'$ Appointment(In office/virtual)/ '[]'$  [facility]/ '[]'$ Home Care/ '[]'$ Refused Recommended Disposition /'[]'$ [place] [facility]/ '[]'$  Follow-up with PCP Additional Notes: Pain constant.  Reason for Disposition  [1] Pain radiates into the thigh or further down the leg AND [2] both legs  Answer Assessment - Initial Assessment Questions 1. ONSET: "When did the pain begin?"      1 week ago 2. LOCATION: "Where does it hurt?" (upper, mid or lower back)     Right side 3. SEVERITY: "How bad is the pain?"  (e.g., Scale 1-10; mild, moderate, or severe)   - MILD (1-3): Doesn't interfere with normal activities.    - MODERATE (4-7): Interferes with normal activities or awakens from sleep.    - SEVERE (8-10): Excruciating pain, unable to do any normal activities.      Pressure - 7 4. PATTERN: "Is the pain constant?" (e.g., yes, no; constant, intermittent)      Constant 5. RADIATION: "Does the pain shoot into your legs or somewhere else?"     Legs and neck 6. CAUSE:  "What do you think is causing the back pain?"      Maybe an injury 7. BACK OVERUSE:  "Any recent lifting of heavy objects, strenuous work or exercise?"     Yes 8. MEDICINES: "What have you taken so far for the pain?" (e.g., nothing, acetaminophen, NSAIDS)     Tylenol 9. NEUROLOGIC SYMPTOMS: "Do you have any weakness, numbness, or problems with bowel/bladder control?"     No 10. OTHER SYMPTOMS: "Do you have any other symptoms?" (e.g., fever, abdomen pain, burning with urination, blood in urine)       No 11. PREGNANCY: "Is there any chance you are pregnant?" "When was your last menstrual period?"       No  Protocols used: Back Pain-A-AH

## 2021-11-28 ENCOUNTER — Ambulatory Visit
Admission: RE | Admit: 2021-11-28 | Discharge: 2021-11-28 | Disposition: A | Discharge status: Home / Self Care | Payer: 59 | Source: Ambulatory Visit | Attending: Family Medicine | Admitting: Family Medicine

## 2021-11-28 DIAGNOSIS — Z1231 Encounter for screening mammogram for malignant neoplasm of breast: Secondary | ICD-10-CM | POA: Insufficient documentation

## 2021-12-23 ENCOUNTER — Ambulatory Visit: Payer: 59 | Attending: Family Medicine | Admitting: Physical Therapy

## 2021-12-23 DIAGNOSIS — R278 Other lack of coordination: Secondary | ICD-10-CM | POA: Insufficient documentation

## 2021-12-23 DIAGNOSIS — M549 Dorsalgia, unspecified: Secondary | ICD-10-CM | POA: Diagnosis not present

## 2021-12-23 DIAGNOSIS — R2689 Other abnormalities of gait and mobility: Secondary | ICD-10-CM | POA: Insufficient documentation

## 2021-12-23 DIAGNOSIS — M533 Sacrococcygeal disorders, not elsewhere classified: Secondary | ICD-10-CM | POA: Diagnosis not present

## 2021-12-23 NOTE — Patient Instructions (Signed)
  Lengthen Back rib by L  shoulder ( winging)    Lie on R  side , pillow between knees and under head  Pull  L arm overhead over mattress, grab the edge of mattress,pull it upward, drawing elbow away from ears  Breathing 10 reps  Brushing arm with 3/4 turn onto pillow behind back  Lying on R  side ,Pillow/ Block between knees     dragging top forearm across ribs below breast rotating 3/4 turn,  rotating  _L_ only this week  and then back to other palm , maintain top palm on body whole top and not lift shoulder  ___

## 2021-12-23 NOTE — Therapy (Unsigned)
OUTPATIENT PHYSICAL THERAPY EVALUATION   Patient Name: Karen Walker. MRN: [id number] DOB:April 19, 1970, 51 y.o., female Today's Date: 12/24/2021   PT End of Session - 12/23/21 1026     Visit Number 1    Number of Visits 10    Date for PT Re-Evaluation 03/03/22    PT Start Time 9741    PT Stop Time 1105    PT Time Calculation (min) 42 min             Past Medical History:  Diagnosis Date   Allergic rhinitis    Anxiety    Breast lump on right side at 8 o'clock position 12/06/2014   Depression    Dysplastic nevus 02/14/2020   Mod to severe - close to margin R foot inf to her med maleolus   History of skin cancer    Hx of migraine headaches    light sensivity, unilateral HA   Urticaria    Past Surgical History:  Procedure Laterality Date   BREAST CYST ASPIRATION Right 2013   negative. Done at Dr. Dwyane Luo office   COLONOSCOPY WITH PROPOFOL N/A 07/20/2020   Procedure: COLONOSCOPY WITH PROPOFOL;  Surgeon: Virgel Manifold, MD;  Location: [facility] ENDOSCOPY;  Service: Endoscopy;  Laterality: N/A;  SPANISH INTERPRETER   ESOPHAGOGASTRODUODENOSCOPY (EGD) WITH PROPOFOL N/A 07/20/2020   Procedure: ESOPHAGOGASTRODUODENOSCOPY (EGD) WITH PROPOFOL;  Surgeon: Virgel Manifold, MD;  Location: [facility] ENDOSCOPY;  Service: Endoscopy;  Laterality: N/A;   HERNIA REPAIR  2014   hysterectemy     melonoma removal on R medial arch of foot  2008   TOTAL ABDOMINAL HYSTERECTOMY W/ BILATERAL SALPINGOOPHORECTOMY  2014   TOTAL ABDOMINAL HYSTERECTOMY W/ BILATERAL SALPINGOOPHORECTOMY     UMBILICAL HERNIA REPAIR     occurred with hysterectemy   Patient Active Problem List   Diagnosis Date Noted   Other dysphagia    Schatzki's ring    Screen for colon cancer    Plantar fasciitis 09/06/2019   Spinal stenosis of lumbar region with neurogenic claudication 01/15/2019   Vaginal atrophy 06/24/2017   Multiple allergies 01/13/2017   Major depression, recurrent ([place]) 12/26/2016   Varicose veins of  leg with pain, bilateral 12/12/2016   Allergic rhinitis    History of skin cancer     PCP: Park Liter DO  REFERRING PROVIDER: Park Liter DO  REFERRING DIAG: Acute right-sided back pain, unspecified back location  Rationale for Evaluation and Treatment Rehabilitation  THERAPY DIAG:  Other abnormalities of gait and mobility - Plan: PT plan of care cert/re-cert  Acute right-sided back pain, unspecified back location - Plan: PT plan of care cert/re-cert  Other lack of coordination - Plan: PT plan of care cert/re-cert  Sacrococcygeal disorders, not elsewhere classified - Plan: PT plan of care cert/re-cert  ONSET DATE:  over 3 months   SUBJECTIVE:  SUBJECTIVE STATEMENT: Over the past 3 months, pt experiences R midback pain and sometimes it goes up her R neck and goes down back of her R leg and it goes numb like pins and needles.  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  6-7/10 on average. At worst 10/10 and she can not sleep. Pt was prescribed steroids medicaiton for the pain but it made her stomach hurt so she stopped taking them. Pt also tried the muscle relaxer but it made breathe fast so she stopped taking them. Relieving factors: pt takes Tylenol and Advil and her husband massages her.     Pain is worst with lifting and pushing at works carts and trays. Pain also hurts around the hips when lying on her side.      One time, pt got a shot for the pain in [place] but it did not help.   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  Pt explained this situation has been going on for 2 years. Pt was scheduled to meet with  [facility] President last week and has a follow up appt this afternoon. She had already notified her supervisors.   PERTINENT HISTORY:  Hysterectomy, R foot surgery, 5 vaginal deliveries,    PAIN:  Are you having pain? Yes: see above  PRECAUTIONS: None  WEIGHT BEARING RESTRICTIONS No  FALLS:  Has patient fallen in last 6 months? No  LIVING ENVIRONMENT: Lives with: lives with their family Lives in: House/apartment   OCCUPATION:  works in [employer] , food prep   PLOF: Independent  PATIENT GOALS   Feel better   OBJECTIVE:    [facility] PT Assessment - 12/24/21 1052       Observation/Other Assessments   Observations forward head posture      Coordination   Coordination and Movement Description upper trap overuse, limited depression of scapular mobility downward adduction B      Other:   Other/Comments simulated work tasks with pushing. pulling carts,      AROM   Overall AROM Comments L sidebend and rotation of the trunk caused R radiating pain down the back of her foot,      Palpation   Palpation comment L posterior intercostal/ by medial/ scapular mm tightness,             [facility] Adult PT Treatment/Exercise - 12/24/21 1056       Therapeutic Activites    Other Therapeutic Activities active listening, provided compassion and resources for her stress related work      Neuro Re-ed    Neuro Re-ed Details  cued for feet on ground for proper sitting posture, cued for R thoracic mobilty               HOME EXERCISE PROGRAM: See pt instruction section    ASSESSMENT:  CLINICAL IMPRESSION:   Pt is a 51 yo  who presents with R midback that radiates to her neck and also reports B sciatica which impact QOL, ADL, fitness, and community activities.   Pt's musculoskeletal assessment revealed tightness to  at L T/J junction, forward head, limited spinal /pelvic mobility and R sciatica pain with L sidebend/ rotation, dyscoordination and strength of pelvic floor mm,  poor body mechanics which places strain on the abdominal/pelvic floor mm. These are deficits that  indicate an ineffective intraabdominal pressure system associated with increased risk for pt's Sx.   Pt was provided education on etiology of Sx with anatomy, physiology explanation with images along with the benefits of customized pelvic PT Tx based on pt's medical conditions and musculoskeletal deficits.  Explained the physiology of deep core mm coordination and roles of pelvic floor function for postural control to minimize spinal pain.   Regional interdependent approaches will yield greater benefits in pt's POC. Pt would benefit from a biopsychosocial approach to yield optimal outcomes. Pt is undergoing extreme stress at work. 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  Pt explained this situation has been going on for 2 years. Pt was scheduled to meet with [facility] President last week and has a follow up appt this afternoon. She had already notified her supervisors.  Plan to address mm asymmetries and add deep core strengthening at next session.  Pt benefits from skilled PT.    OBJECTIVE IMPAIRMENTS decreased activity tolerance, decreased coordination, decreased endurance, decreased mobility, difficulty walking, decreased ROM, decreased strength, decreased safety awareness, hypomobility, increased muscle spasms, impaired flexibility, improper body mechanics, postural dysfunction, and pain   ACTIVITY LIMITATIONS  self-care,  home chores, work tasks    PARTICIPATION LIMITATIONS:  community, home   PERSONAL FACTORS   Stress and physical labor , hysterectomy are also affecting patient's functional outcome.    REHAB POTENTIAL: Good   CLINICAL DECISION MAKING: Evolving/moderate complexity   EVALUATION COMPLEXITY: Moderate    PATIENT EDUCATION:    Education details: Showed pt anatomy images. Explained muscles  attachments/ connection, physiology of deep core system/ spinal- thoracic-pelvis-lower kinetic chain as they relate to pt's presentation, Sx, and past Hx. Explained what and how these areas of deficits need to be restored to balance and function    See Therapeutic activity / neuromuscular re-education section  Answered pt's questions.   Person educated: Patient Education method: Explanation, Demonstration, Tactile cues, Verbal cues, and Handouts Education comprehension: verbalized understanding, returned demonstration, verbal cues required, tactile cues required, and needs further education     PLAN: PT FREQUENCY: 1x/week   PT DURATION: 10 weeks   PLANNED INTERVENTIONS: Therapeutic exercises, Therapeutic activity, Neuromuscular re-education, Balance training, Gait training, Patient/Family education, Self Care, Joint mobilization, Spinal mobilization, Moist heat, Taping, and Manual therapy.   PLAN FOR NEXT SESSION: See clinical impression for plan     GOALS: Goals reviewed with patient? Yes  SHORT TERM GOALS: Target date: 01/21/2022    Pt will demo IND with HEP                    Baseline: Not IND            Goal status: INITIAL   LONG TERM GOALS: Target date: 03/03/2022    1.Pt will demo proper deep core coordination without chest breathing and optimal excursion of diaphragm/pelvic floor in order to promote spinal stability and pelvic floor function  Baseline: dyscoordination Goal status: INITIAL  2.  Pt will demo > 5 pt change on FOTO  to improve QOL and function  Lumbar FOTO baseline  Goal status: INITIAL  3.  Pt will demo proper body mechanics in against gravity tasks and ADLs  work tasks, fitness  to minimize straining pelvic floor / back                  Baseline: not IND, improper form that places strain  on pelvic floor                Goal status: INITIAL    4. Pt will demo no RLE radiating pain with L sidebend and rotation in order to perform work  activities with lifting, bending, cooking  Baseline: L sidebend and rotation of the trunk caused R radiating pain down the back of her foot,  Goal status: INITIAL    5. Pt will report no episode of 10/10 pain that prevents her from sleeping across one month in order to indicate improved management of pain and improve sleep quality  Baseline: 10/10 pain that prevents her from sleeping Goal status: INITIAL       [name], PT 12/24/2021, 1:35 PM

## 2022-01-01 ENCOUNTER — Ambulatory Visit: Payer: 59 | Admitting: Physical Therapy

## 2022-01-01 DIAGNOSIS — M533 Sacrococcygeal disorders, not elsewhere classified: Secondary | ICD-10-CM | POA: Diagnosis not present

## 2022-01-01 DIAGNOSIS — R278 Other lack of coordination: Secondary | ICD-10-CM

## 2022-01-01 DIAGNOSIS — R2689 Other abnormalities of gait and mobility: Secondary | ICD-10-CM | POA: Diagnosis not present

## 2022-01-01 DIAGNOSIS — M549 Dorsalgia, unspecified: Secondary | ICD-10-CM

## 2022-01-01 NOTE — Patient Instructions (Signed)
   Side of hip stretch:  Reclined twist for hips and side of the hips/ legs  Lay on your back, knees bend Scoot hips to the R , leave shoulders in place Drop knees to the L side resting onto pillows to keep leg at the same width of hips Pillow under L thigh to minimize too much strain   __  Deep core level 1-2

## 2022-01-01 NOTE — Therapy (Signed)
OUTPATIENT PHYSICAL THERAPY Treatment    Patient Name: Basia Mcginty. MRN: [id number] DOB:10-03-1970, 51 y.o., female Today's Date: 01/01/2022   PT End of Session - 01/01/22 1115     Visit Number 2    Number of Visits 10    Date for PT Re-Evaluation 03/03/22    PT Start Time 1110    PT Stop Time 1145    PT Time Calculation (min) 35 min             Past Medical History:  Diagnosis Date   Allergic rhinitis    Anxiety    Breast lump on right side at 8 o'clock position 12/06/2014   Depression    Dysplastic nevus 02/14/2020   Mod to severe - close to margin R foot inf to her med maleolus   History of skin cancer    Hx of migraine headaches    light sensivity, unilateral HA   Urticaria    Past Surgical History:  Procedure Laterality Date   BREAST CYST ASPIRATION Right 2013   negative. Done at Dr. Dwyane Luo office   COLONOSCOPY WITH PROPOFOL N/A 07/20/2020   Procedure: COLONOSCOPY WITH PROPOFOL;  Surgeon: Virgel Manifold, MD;  Location: [facility] ENDOSCOPY;  Service: Endoscopy;  Laterality: N/A;  SPANISH INTERPRETER   ESOPHAGOGASTRODUODENOSCOPY (EGD) WITH PROPOFOL N/A 07/20/2020   Procedure: ESOPHAGOGASTRODUODENOSCOPY (EGD) WITH PROPOFOL;  Surgeon: Virgel Manifold, MD;  Location: [facility] ENDOSCOPY;  Service: Endoscopy;  Laterality: N/A;   HERNIA REPAIR  2014   hysterectemy     melonoma removal on R medial arch of foot  2008   TOTAL ABDOMINAL HYSTERECTOMY W/ BILATERAL SALPINGOOPHORECTOMY  2014   TOTAL ABDOMINAL HYSTERECTOMY W/ BILATERAL SALPINGOOPHORECTOMY     UMBILICAL HERNIA REPAIR     occurred with hysterectemy   Patient Active Problem List   Diagnosis Date Noted   Other dysphagia    Schatzki's ring    Screen for colon cancer    Plantar fasciitis 09/06/2019   Spinal stenosis of lumbar region with neurogenic claudication 01/15/2019   Vaginal atrophy 06/24/2017   Multiple allergies 01/13/2017   Major depression, recurrent ([place]) 12/26/2016   Varicose veins of  leg with pain, bilateral 12/12/2016   Allergic rhinitis    History of skin cancer     PCP: Park Liter DO  REFERRING PROVIDER: Park Liter DO  REFERRING DIAG: Acute right-sided back pain, unspecified back location  Rationale for Evaluation and Treatment Rehabilitation  THERAPY DIAG:  Acute right-sided back pain, unspecified back location  Other abnormalities of gait and mobility  Other lack of coordination  Sacrococcygeal disorders, not elsewhere classified  ONSET DATE:  over 3 months   SUBJECTIVE:  SUBJECTIVE TODAY 01/01/22      Midback pain today is 8-9/10 by R shoulder. The stretches helped.      B hips have been hurting 7-8/10     Pt also reports pain with sexual intercourse.     SUBJECTIVE STATEMENT  EVAL 12/23/21  Over the past 3 months, pt experiences R midback pain and sometimes it goes up her R neck and goes down back of her R leg and it goes numb like pins and needles.  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  6-7/10 on average. At worst 10/10 and she can not sleep. Pt was prescribed steroids medicaiton for the pain but it made her stomach hurt so she stopped taking them. Pt also tried the muscle relaxer but it made breathe fast so she stopped taking them. Relieving factors: pt takes Tylenol and Advil and her husband massages her.     Pain is worst with lifting and pushing at works carts and trays. Pain also hurts around the hips when lying on her side.      One time, pt got a shot for the pain in [place] but it did not help.   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  Pt explained this situation has  been going on for 2 years. Pt was scheduled to meet with [facility] President last week and has a follow up appt this afternoon. She had already notified her supervisors.   PERTINENT HISTORY:  Hysterectomy, R foot surgery, 5 vaginal deliveries,    PAIN:  Are you having pain? Yes: see above  PRECAUTIONS: None  WEIGHT BEARING RESTRICTIONS No  FALLS:  Has patient fallen in last 6 months? No  LIVING ENVIRONMENT: Lives with: lives with their family Lives in: House/apartment   OCCUPATION:  works in [employer] , food prep   PLOF: Independent  PATIENT GOALS   Feel better   OBJECTIVE:     [facility] PT Assessment - 01/01/22 1118       AROM   Overall AROM Comments L sideflexion/ rotation caused radiating pain ( post Tx: no pain)      Strength   Overall Strength Comments L hip abd 4-/5, R hip abd 5/5      Palpation   SI assessment  L ASIS more anterior    Palpation comment tightness / hypomobile L2, intercostals, medial/ supra scap mm B  , L glut mm , L lower kinetic chain             [facility] Adult PT Treatment/Exercise - 01/01/22 1409       Neuro Re-ed    Neuro Re-ed Details  cued for supine reclined twist and deep core level 1-2      Modalities   Modalities Moist Heat      Moist Heat Therapy   Number Minutes Moist Heat 5 Minutes    Moist Heat Location --   thoracic and lumbar, supported butterfly pose ( unbilled)     Manual Therapy   Manual therapy comments STM/ MWM at areas noted in assessment               HOME EXERCISE PROGRAM: See pt instruction section    ASSESSMENT:  CLINICAL IMPRESSION:  Pt demo'd levelled pelvic girdle from last session. Pt 's thoracic region remained  hypomobility and pt had L radiating pain from low back with sideflexion and rotation. These areas improved post Tx manual Tx. Pt reported no pain with L sideflexion/ rotation post Tx. Progressed deep core HEP with cues. Plan to add multidifis strengthening at next session and assess  lower kinetic chain chain. Pt benefits from skilled PT.    OBJECTIVE IMPAIRMENTS decreased activity tolerance, decreased coordination, decreased endurance, decreased mobility, difficulty walking, decreased ROM, decreased strength, decreased safety awareness, hypomobility, increased muscle spasms, impaired flexibility, improper body mechanics, postural dysfunction, and pain   ACTIVITY LIMITATIONS  self-care,  home chores, work tasks    PARTICIPATION LIMITATIONS:  community, home   PERSONAL FACTORS   Stress and physical labor , hysterectomy are also affecting patient's functional outcome.    REHAB POTENTIAL: Good   CLINICAL DECISION MAKING: Evolving/moderate complexity   EVALUATION COMPLEXITY: Moderate    PATIENT EDUCATION:    Education details: Showed pt anatomy images. Explained muscles attachments/ connection, physiology of deep core system/ spinal- thoracic-pelvis-lower kinetic chain as they relate to pt's presentation, Sx, and past Hx. Explained what and how these areas of deficits need to be restored to balance and function    See Therapeutic activity / neuromuscular re-education section  Answered pt's questions.   Person educated: Patient Education method: Explanation, Demonstration, Tactile cues, Verbal cues, and Handouts Education comprehension: verbalized understanding, returned demonstration, verbal cues required, tactile cues required, and needs further education     PLAN: PT FREQUENCY: 1x/week   PT DURATION: 10 weeks   PLANNED INTERVENTIONS: Therapeutic exercises, Therapeutic activity, Neuromuscular re-education, Balance training, Gait training, Patient/Family education, Self Care, Joint mobilization, Spinal mobilization, Moist heat, Taping, and Manual therapy.   PLAN FOR NEXT SESSION: See clinical impression for plan     GOALS: Goals reviewed with patient? Yes  SHORT TERM GOALS: Target date: 01/21/2022    Pt will demo IND with HEP                     Baseline: Not IND            Goal status: INITIAL   LONG TERM GOALS: Target date: 03/03/2022    1.Pt will demo proper deep core coordination without chest breathing and optimal excursion of diaphragm/pelvic floor in order to promote spinal stability and pelvic floor function  Baseline: dyscoordination Goal status: INITIAL  2.  Pt will demo > 5 pt change on FOTO  to improve QOL and function  Lumbar FOTO baseline  Goal status: INITIAL  3.  Pt will demo proper body mechanics in against gravity tasks and ADLs  work tasks, fitness  to minimize straining pelvic floor / back                  Baseline: not IND, improper form that places strain on pelvic floor                Goal status: INITIAL    4. Pt will demo no RLE radiating pain with L sidebend and rotation in order to perform work activities with lifting, bending, cooking  Baseline: L sidebend and rotation of the trunk caused R radiating pain down the back of her foot,  Goal status: INITIAL    5. Pt will report no episode of 10/10 pain that prevents her from sleeping across one month in order to indicate improved management of pain and improve sleep quality  Baseline: 10/10 pain that prevents her from sleeping Goal status: INITIAL  [name], PT 01/01/2022, 11:16 AM

## 2022-01-07 ENCOUNTER — Ambulatory Visit: Payer: 59 | Admitting: Physical Therapy

## 2022-01-07 ENCOUNTER — Ambulatory Visit: Payer: 59 | Attending: Family Medicine | Admitting: Physical Therapy

## 2022-01-07 DIAGNOSIS — R2689 Other abnormalities of gait and mobility: Secondary | ICD-10-CM | POA: Diagnosis not present

## 2022-01-07 DIAGNOSIS — M533 Sacrococcygeal disorders, not elsewhere classified: Secondary | ICD-10-CM | POA: Diagnosis not present

## 2022-01-07 DIAGNOSIS — M549 Dorsalgia, unspecified: Secondary | ICD-10-CM | POA: Insufficient documentation

## 2022-01-07 DIAGNOSIS — R278 Other lack of coordination: Secondary | ICD-10-CM | POA: Diagnosis not present

## 2022-01-07 NOTE — Patient Instructions (Signed)
   Side of hip stretch:  Reclined twist for hips and side of the hips/ legs  Lay on your back, knees bend Scoot hips to the R , leave shoulders in place Rock knees to center and at 45 deg 10 reps  Then  Drop knees to the L side resting onto pillows to keep leg at the same width of hips Pillow under L thigh to minimize too much strain   Switch sides  ___ Do these stretches especially on days you have worked a shift that involved lots of turning

## 2022-01-07 NOTE — Therapy (Addendum)
OUTPATIENT PHYSICAL THERAPY Treatment    Patient Name: Karen Walker. MRN: [id number] DOB:[birth year], 51 y.o., female Today's Date: 01/07/2022   PT End of Session - 01/07/22 1337     Visit Number 3    Number of Visits 10    Date for PT Re-Evaluation 03/03/22    PT Start Time 4235    PT Stop Time 3614    PT Time Calculation (min) 43 min             Past Medical History:  Diagnosis Date   Allergic rhinitis    Anxiety    Breast lump on right side at 8 o'clock position 12/06/2014   Depression    Dysplastic nevus 02/14/2020   Mod to severe - close to margin R foot inf to her med maleolus   History of skin cancer    Hx of migraine headaches    light sensivity, unilateral HA   Urticaria    Past Surgical History:  Procedure Laterality Date   BREAST CYST ASPIRATION Right 2013   negative. Done at Dr. Dwyane Luo office   COLONOSCOPY WITH PROPOFOL N/A 07/20/2020   Procedure: COLONOSCOPY WITH PROPOFOL;  Surgeon: Virgel Manifold, MD;  Location: [facility] ENDOSCOPY;  Service: Endoscopy;  Laterality: N/A;  SPANISH INTERPRETER   ESOPHAGOGASTRODUODENOSCOPY (EGD) WITH PROPOFOL N/A 07/20/2020   Procedure: ESOPHAGOGASTRODUODENOSCOPY (EGD) WITH PROPOFOL;  Surgeon: Virgel Manifold, MD;  Location: [facility] ENDOSCOPY;  Service: Endoscopy;  Laterality: N/A;   HERNIA REPAIR  2014   hysterectemy     melonoma removal on R medial arch of foot  2008   TOTAL ABDOMINAL HYSTERECTOMY W/ BILATERAL SALPINGOOPHORECTOMY  2014   TOTAL ABDOMINAL HYSTERECTOMY W/ BILATERAL SALPINGOOPHORECTOMY     UMBILICAL HERNIA REPAIR     occurred with hysterectemy   Patient Active Problem List   Diagnosis Date Noted   Other dysphagia    Schatzki's ring    Screen for colon cancer    Plantar fasciitis 09/06/2019   Spinal stenosis of lumbar region with neurogenic claudication 01/15/2019   Vaginal atrophy 06/24/2017   Multiple allergies 01/13/2017   Major depression, recurrent ([place]) 12/26/2016   Varicose veins of  leg with pain, bilateral 12/12/2016   Allergic rhinitis    History of skin cancer     PCP: Park Liter DO  REFERRING PROVIDER: Park Liter DO  REFERRING DIAG: Acute right-sided back pain, unspecified back location  Rationale for Evaluation and Treatment Rehabilitation  THERAPY DIAG:  Acute right-sided back pain, unspecified back location  Other abnormalities of gait and mobility  Other lack of coordination  Sacrococcygeal disorders, not elsewhere classified  ONSET DATE:  over 3 months   SUBJECTIVE:  SUBJECTIVE TODAY 01/07/22  Pt is able to sleep with less pain ( 4/5-10/ level) and it no longer wakes up in the middle of night.    L hip and LBP is better and the radiating pain only occurs a little bit.  She feels her midback pain is getting better a little.   SUBJECTIVE STATEMENT  EVAL 12/23/21  Over the past 3 months, pt experiences R midback pain and sometimes it goes up her R neck and goes down back of her R leg and it goes numb like pins and needles.  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  6-7/10 on average. At worst 10/10 and she can not sleep. Pt was prescribed steroids medicaiton for the pain but it made her stomach hurt so she stopped taking them. Pt also tried the muscle relaxer but it made breathe fast so she stopped taking them. Relieving factors: pt takes Tylenol and Advil and her husband massages her.     Pain is worst with lifting and pushing at works carts and trays. Pain also hurts around the hips when lying on her side.      One time, pt got a shot for the pain in [place] but it did not help.   Pt was tearful expressing work stress related to bullying and racism from another employee  towards her and another Medical sales representative.  Pt explained this situation has been going on for 2 years. Pt was scheduled to meet with [facility] President last week and has a follow up appt this afternoon. She had already notified her supervisors.   PERTINENT HISTORY:  Hysterectomy, R foot surgery, 5 vaginal deliveries,    PAIN:  Are you having pain? Yes: see above  PRECAUTIONS: None  WEIGHT BEARING RESTRICTIONS No  FALLS:  Has patient fallen in last 6 months? No  LIVING ENVIRONMENT: Lives with: lives with their family Lives in: House/apartment   OCCUPATION:  works in [employer] , food prep   PLOF: Independent  PATIENT GOALS   Feel better   OBJECTIVE:    [facility] PT Assessment - 01/07/22 1340       Palpation   Spinal mobility convex T7, deviated to R, intercostals / interspinal/ paraspinal, SA/ teres major/ minor tightness    SI assessment  L iliac crest and R shoulder higher standing,  L ASIS, malleoli higher in  higher                                                                     ( post Tx: levelled pelvis/ shoulder)             [facility] Adult PT Treatment/Exercise - 01/07/22 1456       Modalities   Modalities Moist Heat      Moist Heat Therapy   Number Minutes Moist Heat 5 Minutes    Moist Heat Location --   thoracic/ supine relcined twist , supported ( 5 min unbilled)     Manual Therapy   Manual therapy comments STM/MWM along areas noted in assessment to promote mobility at spine and minimize asymmetry of shoulder/ pelvis  HOME EXERCISE PROGRAM: See pt instruction section    ASSESSMENT:  CLINICAL IMPRESSION:  Pt is able to sleep with less pain ( 4/5-10/ level) and it no longer wakes up in the middle of night. This is a major improvement.   Addressed pt's asymmetrical presentation of higher L iliac crest / R shoulder ) which is likely due to her reported of lots of twist movements at work today. Pt demo'd hypomobility/ convex curve at  T7 to R. Pt demo'd improved medial alignment and levelled pelvic / shoulder  post Tx. Plan to add multifidis / scapular retraction resistance band strengthening at next session.   Pt benefits from skilled PT.    OBJECTIVE IMPAIRMENTS decreased activity tolerance, decreased coordination, decreased endurance, decreased mobility, difficulty walking, decreased ROM, decreased strength, decreased safety awareness, hypomobility, increased muscle spasms, impaired flexibility, improper body mechanics, postural dysfunction, and pain   ACTIVITY LIMITATIONS  self-care,  home chores, work tasks    PARTICIPATION LIMITATIONS:  community, home   PERSONAL FACTORS   Stress and physical labor , hysterectomy are also affecting patient's functional outcome.    REHAB POTENTIAL: Good   CLINICAL DECISION MAKING: Evolving/moderate complexity   EVALUATION COMPLEXITY: Moderate    PATIENT EDUCATION:    Education details: Showed pt anatomy images. Explained muscles attachments/ connection, physiology of deep core system/ spinal- thoracic-pelvis-lower kinetic chain as they relate to pt's presentation, Sx, and past Hx. Explained what and how these areas of deficits need to be restored to balance and function    See Therapeutic activity / neuromuscular re-education section  Answered pt's questions.   Person educated: Patient Education method: Explanation, Demonstration, Tactile cues, Verbal cues, and Handouts Education comprehension: verbalized understanding, returned demonstration, verbal cues required, tactile cues required, and needs further education     PLAN: PT FREQUENCY: 1x/week   PT DURATION: 10 weeks   PLANNED INTERVENTIONS: Therapeutic exercises, Therapeutic activity, Neuromuscular re-education, Balance training, Gait training, Patient/Family education, Self Care, Joint mobilization, Spinal mobilization, Moist heat, Taping, and Manual therapy.   PLAN FOR NEXT SESSION: See clinical impression for  plan     GOALS: Goals reviewed with patient? Yes  SHORT TERM GOALS: Target date: 01/21/2022    Pt will demo IND with HEP                    Baseline: Not IND            Goal status: INITIAL   LONG TERM GOALS: Target date: 03/03/2022    1.Pt will demo proper deep core coordination without chest breathing and optimal excursion of diaphragm/pelvic floor in order to promote spinal stability and pelvic floor function  Baseline: dyscoordination Goal status: INITIAL  2.  Pt will demo > 5 pt change on FOTO  to improve QOL and function  Lumbar FOTO baseline  Goal status: INITIAL  3.  Pt will demo proper body mechanics in against gravity tasks and ADLs  work tasks, fitness  to minimize straining pelvic floor / back                  Baseline: not IND, improper form that places strain on pelvic floor                Goal status: INITIAL    4. Pt will demo no RLE radiating pain with L sidebend and rotation in order to perform work activities with lifting, bending, cooking  Baseline: L sidebend and rotation of the trunk caused R radiating  pain down the back of her foot,  Goal status: INITIAL    5. Pt will report no episode of 10/10 pain that prevents her from sleeping across one month in order to indicate improved management of pain and improve sleep quality  Baseline: 10/10 pain that prevents her from sleeping Goal status: INITIAL       [name], PT 01/07/2022, 2:58 PM

## 2022-01-09 ENCOUNTER — Ambulatory Visit (INDEPENDENT_AMBULATORY_CARE_PROVIDER_SITE_OTHER): Payer: Self-pay | Admitting: Dermatology

## 2022-01-09 DIAGNOSIS — I8393 Asymptomatic varicose veins of bilateral lower extremities: Secondary | ICD-10-CM

## 2022-01-09 NOTE — Progress Notes (Signed)
   Follow-Up Visit   Subjective  Karen Walker. is a 51 y.o. female who presents for the following: Varicose Veins (Patient here for sclerotherapy for unwanted veins ).  The following portions of the chart were reviewed this encounter and updated as appropriate:   Tobacco  Allergies  Meds  Problems  Med Hx  Surg Hx  Fam Hx     Review of Systems:  No other skin or systemic complaints except as noted in HPI or Assessment and Plan.  Objective  Well appearing patient in no apparent distress; mood and affect are within normal limits.  A focused examination was performed including bilateral lower legs. Relevant physical exam findings are noted in the Assessment and Plan.  Right Lower Leg - Anterior - Dilated blue, purple or red veins at the lower extremities                            Assessment & Plan  Asymptomatic varicose veins of both lower extremities Right Lower Leg - Anterior  Multiple treatment sessions may be needed.   Sclerotherapy - Right Lower Leg - Anterior The patient presents for desired sclerotherapy for desired treatment of desired treatment of small to medium blue  varicosities of the lower legs.  Procedure: The patient was counseled and understands about the effects, side effects and potential risks and complications of the sclerotherapy procedure. The patient was given the opportunity to ask questions. Asclera (polidocanol) 1% (total 2cc) was injected into the varices. In order to ensure correct placement of the catheter in the vein, I drew back slightly to give moderate blood show in the syringe. If there was any evidence or suspicion of extravasation of sclerosant, the area was immediately diluted with a large volume of 0.9% saline. A pressure dressing was applied immediately to the injected sites. The patient tolerated the procedure well without complication. The patient was instructed in post-operative compression stocking use. The  patient understands to call or return immediately if any problems noted.  Return in about 3 months (around 04/11/2022) for sclerotherapy .  IMarye Round, CMA, am acting as scribe for Sarina Ser, MD .  Documentation: I have reviewed the above documentation for accuracy and completeness, and I agree with the above.  Sarina Ser, MD

## 2022-01-09 NOTE — Patient Instructions (Signed)
Due to recent changes in healthcare laws, you may see results of your pathology and/or laboratory studies on MyChart before the doctors have had a chance to review them. We understand that in some cases there may be results that are confusing or concerning to you. Please understand that not all results are received at the same time and often the doctors may need to interpret multiple results in order to provide you with the best plan of care or course of treatment. Therefore, we ask that you please give us 2 business days to thoroughly review all your results before contacting the office for clarification. Should we see a critical lab result, you will be contacted sooner.   If You Need Anything After Your Visit  If you have any questions or concerns for your doctor, please call our main line at [phone] and press option 4 to reach your doctor's medical assistant. If no one answers, please leave a voicemail as directed and we will return your call as soon as possible. Messages left after 4 pm will be answered the following business day.   You may also send us a message via MyChart. We typically respond to MyChart messages within 1-2 business days.  For prescription refills, please ask your pharmacy to contact our office. Our fax number is [phone].  If you have an urgent issue when the clinic is closed that cannot wait until the next business day, you can page your doctor at the number below.    Please note that while we do our best to be available for urgent issues outside of office hours, we are not available 24/7.   If you have an urgent issue and are unable to reach us, you may choose to seek medical care at your doctor's office, retail clinic, urgent care center, or emergency room.  If you have a medical emergency, please immediately call 911 or go to the emergency department.  Pager Numbers  - Dr. Kowalski: [phone]  - Dr. Moye: [phone]  - Dr. Stewart:  [phone]  In the event of inclement weather, please call our main line at [phone] for an update on the status of any delays or closures.  Dermatology Medication Tips: Please keep the boxes that topical medications come in in order to help keep track of the instructions about where and how to use these. Pharmacies typically print the medication instructions only on the boxes and not directly on the medication tubes.   If your medication is too expensive, please contact our office at [phone] option 4 or send us a message through MyChart.   We are unable to tell what your co-pay for medications will be in advance as this is different depending on your insurance coverage. However, we may be able to find a substitute medication at lower cost or fill out paperwork to get insurance to cover a needed medication.   If a prior authorization is required to get your medication covered by your insurance company, please allow us 1-2 business days to complete this process.  Drug prices often vary depending on where the prescription is filled and some pharmacies may offer cheaper prices.  The website www.goodrx.com contains coupons for medications through different pharmacies. The prices here do not account for what the cost may be with help from insurance (it may be cheaper with your insurance), but the website can give you the price if you did not use any insurance.  - You can print the associated coupon and take it with   your prescription to the pharmacy.  - You may also stop by our office during regular business hours and pick up a GoodRx coupon card.  - If you need your prescription sent electronically to a different pharmacy, notify our office through [place] [facility] [place] MyChart or by phone at [phone] option 4.     Si Usted Necesita Algo Despus de Su Visita  Tambin puede enviarnos un mensaje a travs de MyChart. Por lo general respondemos a los mensajes de MyChart en el transcurso de 1 a 2  das hbiles.  Para renovar recetas, por favor pida a su farmacia que se ponga en contacto con nuestra oficina. Nuestro nmero de fax es el [phone].  Si tiene un asunto urgente cuando la clnica est cerrada y que no puede esperar hasta el siguiente da hbil, puede llamar/localizar a su doctor(a) al nmero que aparece a continuacin.   Por favor, tenga en cuenta que aunque hacemos todo lo posible para estar disponibles para asuntos urgentes fuera del horario de oficina, no estamos disponibles las 24 horas del da, los 7 das de la semana.   Si tiene un problema urgente y no puede comunicarse con nosotros, puede optar por buscar atencin mdica  en el consultorio de su doctor(a), en una clnica privada, en un centro de atencin urgente o en una sala de emergencias.  Si tiene una emergencia mdica, por favor llame inmediatamente al 911 o vaya a la sala de emergencias.  Nmeros de bper  - Dr. Kowalski: [phone]  - Dra. Moye: [phone]  - Dra. Stewart: [phone]  En caso de inclemencias del tiempo, por favor llame a nuestra lnea principal al [phone] para una actualizacin sobre el estado de cualquier retraso o cierre.  Consejos para la medicacin en dermatologa: Por favor, guarde las cajas en las que vienen los medicamentos de uso tpico para ayudarle a seguir las instrucciones sobre dnde y cmo usarlos. Las farmacias generalmente imprimen las instrucciones del medicamento slo en las cajas y no directamente en los tubos del medicamento.   Si su medicamento es muy caro, por favor, pngase en contacto con nuestra oficina llamando al [phone] y presione la opcin 4 o envenos un mensaje a travs de MyChart.   No podemos decirle cul ser su copago por los medicamentos por adelantado ya que esto es diferente dependiendo de la cobertura de su seguro. Sin embargo, es posible que podamos encontrar un medicamento sustituto a menor costo o llenar un formulario para que el  seguro cubra el medicamento que se considera necesario.   Si se requiere una autorizacin previa para que su compaa de seguros cubra su medicamento, por favor permtanos de 1 a 2 das hbiles para completar este proceso.  Los precios de los medicamentos varan con frecuencia dependiendo del lugar de dnde se surte la receta y alguna farmacias pueden ofrecer precios ms baratos.  El sitio web www.goodrx.com tiene cupones para medicamentos de diferentes farmacias. Los precios aqu no tienen en cuenta lo que podra costar con la ayuda del seguro (puede ser ms barato con su seguro), pero el sitio web puede darle el precio si no utiliz ningn seguro.  - Puede imprimir el cupn correspondiente y llevarlo con su receta a la farmacia.  - Tambin puede pasar por nuestra oficina durante el horario de atencin regular y recoger una tarjeta de cupones de GoodRx.  - Si necesita que su receta se enve electrnicamente a una farmacia diferente, informe a nuestra oficina a travs de MyChart de [place]   o por telfono llamando al [phone] y presione la opcin 4.  

## 2022-01-10 ENCOUNTER — Encounter: Payer: Self-pay | Admitting: Family Medicine

## 2022-01-10 ENCOUNTER — Other Ambulatory Visit: Payer: Self-pay

## 2022-01-10 ENCOUNTER — Ambulatory Visit (INDEPENDENT_AMBULATORY_CARE_PROVIDER_SITE_OTHER): Payer: 59 | Admitting: Family Medicine

## 2022-01-10 VITALS — BP 88/60 | HR 81 | Temp 97.9°F | Ht 64.0 in | Wt 158.1 lb

## 2022-01-10 DIAGNOSIS — R8281 Pyuria: Secondary | ICD-10-CM

## 2022-01-10 DIAGNOSIS — Z23 Encounter for immunization: Secondary | ICD-10-CM | POA: Diagnosis not present

## 2022-01-10 DIAGNOSIS — Z0000 Encounter for general adult medical examination without abnormal findings: Secondary | ICD-10-CM

## 2022-01-10 DIAGNOSIS — Z1231 Encounter for screening mammogram for malignant neoplasm of breast: Secondary | ICD-10-CM | POA: Diagnosis not present

## 2022-01-10 LAB — URINALYSIS, ROUTINE W REFLEX MICROSCOPIC
Bilirubin, UA: NEGATIVE
Glucose, UA: NEGATIVE
Ketones, UA: NEGATIVE
Nitrite, UA: POSITIVE — AB
Specific Gravity, UA: >1.03 — ABNORMAL HIGH (ref 1.005–1.030)
Urobilinogen, Ur: 0.2 mg/dL (ref 0.2–1.0)
pH, UA: 6 (ref 5.0–7.5)

## 2022-01-10 LAB — MICROSCOPIC EXAMINATION

## 2022-01-10 MED ORDER — TRIAMCINOLONE ACETONIDE 0.5 % EX OINT
1.0000 | TOPICAL_OINTMENT | Freq: Two times a day (BID) | CUTANEOUS | 6 refills | Status: DC
  Filled 2022-01-10: qty 30, 30d supply, fill #0
  Filled 2022-08-01: qty 30, 30d supply, fill #1

## 2022-01-10 MED ORDER — VALACYCLOVIR HCL 500 MG PO TABS
500.0000 mg | ORAL_TABLET | Freq: Two times a day (BID) | ORAL | 12 refills | Status: DC
  Filled 2022-01-10: qty 10, 5d supply, fill #0
  Filled 2022-05-05: qty 10, 5d supply, fill #1
  Filled 2022-06-30: qty 10, 5d supply, fill #2
  Filled 2022-08-01: qty 10, 5d supply, fill #3
  Filled 2022-12-17: qty 10, 5d supply, fill #4
  Filled 2023-01-02: qty 10, 5d supply, fill #5

## 2022-01-10 NOTE — Addendum Note (Signed)
Addended by: Valerie Roys on: 01/10/2022 02:03 PM   Modules accepted: Orders

## 2022-01-10 NOTE — Progress Notes (Signed)
BP (!) 88/60   Pulse 81   Temp 97.9 F (36.6 C)   Ht '5\' 4"'$  (1.626 m)   Wt 158 lb 1.6 oz (71.7 kg)   SpO2 98%   BMI 27.14 kg/m    Subjective:    Patient ID: Karen Walker., female    DOB: July 08, 1970, 50 y.o.   MRN: [id number]  HPI: Karen Walker is a 51 y.o. female presenting on 01/10/2022 for comprehensive medical examination. Current medical complaints include:none  She currently lives with: kids and husband Menopausal Symptoms: no  Depression Screen done today and results listed below:     01/10/2022    1:25 PM 11/25/2021   11:07 AM 01/09/2021   10:22 AM 10/26/2020   10:21 AM 07/03/2020    4:38 PM  Depression screen PHQ 2/9  Decreased Interest 0 2 0  0  Down, Depressed, Hopeless 0 2 0 0 0  PHQ - 2 Score 0 4 0 0 0  Altered sleeping 0 1 0 0 0  Tired, decreased energy 0 1 0 0 0  Change in appetite 0 2 0 0 0  Feeling bad or failure about yourself  0 2 0 0 0  Trouble concentrating 0 1 0 0 1  Moving slowly or fidgety/restless 0 0 0 0 0  Suicidal thoughts 0 0 0 0 0  PHQ-9 Score 0 11 0 0 1  Difficult doing work/chores Not difficult at all Somewhat difficult Not difficult at all Not difficult at all Not difficult at all    Past Medical History:  Past Medical History:  Diagnosis Date   Allergic rhinitis    Anxiety    Breast lump on right side at 8 o'clock position 12/06/2014   Depression    Dysplastic nevus 02/14/2020   Mod to severe - close to margin R foot inf to her med maleolus   History of skin cancer    Hx of migraine headaches    light sensivity, unilateral HA   Urticaria     Surgical History:  Past Surgical History:  Procedure Laterality Date   BREAST CYST ASPIRATION Right 2013   negative. Done at Dr. Dwyane Luo office   COLONOSCOPY WITH PROPOFOL N/A 07/20/2020   Procedure: COLONOSCOPY WITH PROPOFOL;  Surgeon: Virgel Manifold, MD;  Location: [facility] ENDOSCOPY;  Service: Endoscopy;  Laterality: N/A;  SPANISH INTERPRETER   ESOPHAGOGASTRODUODENOSCOPY (EGD)  WITH PROPOFOL N/A 07/20/2020   Procedure: ESOPHAGOGASTRODUODENOSCOPY (EGD) WITH PROPOFOL;  Surgeon: Virgel Manifold, MD;  Location: [facility] ENDOSCOPY;  Service: Endoscopy;  Laterality: N/A;   HERNIA REPAIR  2014   hysterectemy     melonoma removal on R medial arch of foot  2008   TOTAL ABDOMINAL HYSTERECTOMY W/ BILATERAL SALPINGOOPHORECTOMY  2014   TOTAL ABDOMINAL HYSTERECTOMY W/ BILATERAL SALPINGOOPHORECTOMY     UMBILICAL HERNIA REPAIR     occurred with hysterectemy    Medications:  Current Outpatient Medications on File Prior to Visit  Medication Sig   cyclobenzaprine (FLEXERIL) 10 MG tablet Take 1 tablet (10 mg total) by mouth at bedtime. (Patient not taking: Reported on 01/10/2022)   [DISCONTINUED] albuterol (VENTOLIN HFA) 108 (90 Base) MCG/ACT inhaler Inhale 2 puffs into the lungs every 6 (six) hours as needed for wheezing or shortness of breath. (Patient not taking: Reported on 07/03/2020)   No current facility-administered medications on file prior to visit.    Allergies:  Allergies  Allergen Reactions   Codeine Other (See Comments)   Pollen Extract  Social History:  Social History   Socioeconomic History   Marital status: Married    Spouse name: Not on file   Number of children: Not on file   Years of education: Not on file   Highest education level: Not on file  Occupational History   Not on file  Tobacco Use   Smoking status: Never   Smokeless tobacco: Never  Vaping Use   Vaping Use: Never used  Substance and Sexual Activity   Alcohol use: No   Drug use: No   Sexual activity: Yes    Birth control/protection: Surgical  Other Topics Concern   Not on file  Social History Narrative   Not on file   Social Determinants of Health   Financial Resource Strain: Not on file  Food Insecurity: Not on file  Transportation Needs: Not on file  Physical Activity: Not on file  Stress: Not on file  Social Connections: Not on file  Intimate Partner Violence: Not  on file   Social History   Tobacco Use  Smoking Status Never  Smokeless Tobacco Never   Social History   Substance and Sexual Activity  Alcohol Use No    Family History:  Family History  Problem Relation Age of Onset   Stroke Father    Asthma Daughter    Urticaria Son    Food Allergy Son        red dye, dairy   Allergic rhinitis Neg Hx    Angioedema Neg Hx    Eczema Neg Hx     Past medical history, surgical history, medications, allergies, family history and social history reviewed with patient today and changes made to appropriate areas of the chart.   Review of Systems  Constitutional: Negative.   HENT:  Positive for hearing loss. Negative for congestion, ear discharge, ear pain, nosebleeds, sinus pain, sore throat and tinnitus.   Eyes:  Positive for blurred vision. Negative for double vision, photophobia, pain, discharge and redness.  Respiratory: Negative.  Negative for stridor.   Cardiovascular: Negative.   Gastrointestinal:  Positive for constipation. Negative for abdominal pain, blood in stool, diarrhea, heartburn, melena, nausea and vomiting.       + bloating  Genitourinary: Negative.   Musculoskeletal: Negative.   Skin: Negative.   Neurological:  Positive for dizziness and headaches. Negative for tingling, tremors, sensory change, speech change, focal weakness, seizures, loss of consciousness and weakness.  Endo/Heme/Allergies:  Positive for environmental allergies. Negative for polydipsia. Does not bruise/bleed easily.  Psychiatric/Behavioral: Negative.     All other ROS negative except what is listed above and in the HPI.      Objective:    BP (!) 88/60   Pulse 81   Temp 97.9 F (36.6 C)   Ht '5\' 4"'$  (1.626 m)   Wt 158 lb 1.6 oz (71.7 kg)   SpO2 98%   BMI 27.14 kg/m   Wt Readings from Last 3 Encounters:  01/10/22 158 lb 1.6 oz (71.7 kg)  11/25/21 158 lb (71.7 kg)  04/17/21 154 lb 6.4 oz (70 kg)    Physical Exam  Results for orders placed or  performed in visit on 04/17/21  Rapid Strep screen([facility])   Specimen: Other   Other  Result Value Ref Range   Strep Gp A Ag, IA W/Reflex Negative Negative  Novel Coronavirus, NAA ([facility])   Specimen: Nasopharyngeal(NP) swabs in vial transport medium  Result Value Ref Range   SARS-CoV-2, NAA Not Detected Not Detected  Culture, Group A  Strep   Other  Result Value Ref Range   Strep A Culture Negative   SARS-COV-2, NAA 2 DAY TAT  Result Value Ref Range   SARS-CoV-2, NAA 2 DAY TAT Performed   Veritor Flu A/B Waived  Result Value Ref Range   Influenza A Negative Negative   Influenza B Negative Negative      Assessment & Plan:   Problem List Items Addressed This Visit   None Visit Diagnoses     Routine general medical examination at a health care facility    -  Primary   Vaccines updated. Screening labs checked today. Pap N/A. Mammo and colonscopy up to date. Continue diet and exercise. Call with any concerns.    Relevant Orders   CBC with Differential/Platelet   Comprehensive metabolic panel   Lipid Panel w/o Chol/HDL Ratio   Urinalysis, Routine w reflex microscopic   TSH   Encounter for screening mammogram for malignant neoplasm of breast       Screening for breast cancer   Relevant Orders   MM 3D SCREEN BREAST BILATERAL        Follow up plan: Return in about 1 year (around 01/11/2023), or physical.   LABORATORY TESTING:  - Pap smear: not applicable  IMMUNIZATIONS:   - Tdap: Tetanus vaccination status reviewed: last tetanus booster within 10 years. - Influenza: Up to date - Pneumovax: Not applicable - Prevnar: Not applicable - COVID: Up to date - HPV: Not applicable - Shingrix vaccine: Administered today  SCREENING: -Mammogram: Up to date  - Colonoscopy: Up to date   PATIENT COUNSELING:   Advised to take 1 mg of folate supplement per day if capable of pregnancy.   Sexuality: Discussed sexually transmitted diseases, partner selection, use of  condoms, avoidance of unintended pregnancy  and contraceptive alternatives.   Advised to avoid cigarette smoking.  I discussed with the patient that most people either abstain from alcohol or drink within safe limits (<=14/week and <=4 drinks/occasion for males, <=7/weeks and <= 3 drinks/occasion for females) and that the risk for alcohol disorders and other health effects rises proportionally with the number of drinks per week and how often a drinker exceeds daily limits.  Discussed cessation/primary prevention of drug use and availability of treatment for abuse.   Diet: Encouraged to adjust caloric intake to maintain  or achieve ideal body weight, to reduce intake of dietary saturated fat and total fat, to limit sodium intake by avoiding high sodium foods and not adding table salt, and to maintain adequate dietary potassium and calcium preferably from fresh fruits, vegetables, and low-fat dairy products.    stressed the importance of regular exercise  Injury prevention: Discussed safety belts, safety helmets, smoke detector, smoking near bedding or upholstery.   Dental health: Discussed importance of regular tooth brushing, flossing, and dental visits.    NEXT PREVENTATIVE PHYSICAL DUE IN 1 YEAR. Return in about 1 year (around 01/11/2023), or physical.

## 2022-01-11 LAB — CBC WITH DIFFERENTIAL/PLATELET
Basophils Absolute: 0 10*3/uL (ref 0.0–0.2)
Basos: 0 %
EOS (ABSOLUTE): 0.1 10*3/uL (ref 0.0–0.4)
Eos: 2 %
Hematocrit: 38 % (ref 34.0–46.6)
Hemoglobin: 12.5 g/dL (ref 11.1–15.9)
Immature Grans (Abs): 0 10*3/uL (ref 0.0–0.1)
Immature Granulocytes: 0 %
Lymphocytes Absolute: 1.8 10*3/uL (ref 0.7–3.1)
Lymphs: 38 %
MCH: 29.2 pg (ref 26.6–33.0)
MCHC: 32.9 g/dL (ref 31.5–35.7)
MCV: 89 fL (ref 79–97)
Monocytes Absolute: 0.2 10*3/uL (ref 0.1–0.9)
Monocytes: 5 %
Neutrophils Absolute: 2.6 10*3/uL (ref 1.4–7.0)
Neutrophils: 55 %
Platelets: 200 10*3/uL (ref 150–450)
RBC: 4.28 x10E6/uL (ref 3.77–5.28)
RDW: 12.8 % (ref 11.7–15.4)
WBC: 4.7 10*3/uL (ref 3.4–10.8)

## 2022-01-11 LAB — COMPREHENSIVE METABOLIC PANEL
ALT: 16 IU/L (ref 0–32)
AST: 17 IU/L (ref 0–40)
Albumin/Globulin Ratio: 1.7 (ref 1.2–2.2)
Albumin: 4.3 g/dL (ref 3.8–4.9)
Alkaline Phosphatase: 60 IU/L (ref 44–121)
BUN/Creatinine Ratio: 19 (ref 9–23)
BUN: 14 mg/dL (ref 6–24)
Bilirubin Total: 0.4 mg/dL (ref 0.0–1.2)
CO2: 27 mmol/L (ref 20–29)
Calcium: 9.3 mg/dL (ref 8.7–10.2)
Chloride: 101 mmol/L (ref 96–106)
Creatinine, Ser: 0.73 mg/dL (ref 0.57–1.00)
Globulin, Total: 2.5 g/dL (ref 1.5–4.5)
Glucose: 104 mg/dL — ABNORMAL HIGH (ref 70–99)
Potassium: 3.6 mmol/L (ref 3.5–5.2)
Sodium: 143 mmol/L (ref 134–144)
Total Protein: 6.8 g/dL (ref 6.0–8.5)
eGFR: 100 mL/min/{1.73_m2} (ref >59)

## 2022-01-11 LAB — LIPID PANEL W/O CHOL/HDL RATIO
Cholesterol, Total: 190 mg/dL (ref 100–199)
HDL: 62 mg/dL (ref >39)
LDL Chol Calc (NIH): 113 mg/dL — ABNORMAL HIGH (ref 0–99)
Triglycerides: 83 mg/dL (ref 0–149)
VLDL Cholesterol Cal: 15 mg/dL (ref 5–40)

## 2022-01-11 LAB — TSH: TSH: 1.51 u[IU]/mL (ref 0.450–4.500)

## 2022-01-13 ENCOUNTER — Encounter: Payer: 59 | Admitting: Physical Therapy

## 2022-01-13 ENCOUNTER — Other Ambulatory Visit: Payer: Self-pay | Admitting: Family Medicine

## 2022-01-13 ENCOUNTER — Other Ambulatory Visit: Payer: Self-pay

## 2022-01-13 MED ORDER — SULFAMETHOXAZOLE-TRIMETHOPRIM 800-160 MG PO TABS
1.0000 | ORAL_TABLET | Freq: Two times a day (BID) | ORAL | 0 refills | Status: DC
  Filled 2022-01-13: qty 14, 7d supply, fill #0

## 2022-01-14 ENCOUNTER — Encounter: Payer: Self-pay | Admitting: Dermatology

## 2022-01-14 LAB — URINE CULTURE

## 2022-01-15 ENCOUNTER — Encounter: Payer: 59 | Admitting: Physical Therapy

## 2022-01-16 ENCOUNTER — Ambulatory Visit: Payer: 59 | Admitting: Nurse Practitioner

## 2022-01-20 ENCOUNTER — Encounter: Payer: 59 | Admitting: Physical Therapy

## 2022-01-21 ENCOUNTER — Ambulatory Visit: Payer: 59 | Admitting: Physical Therapy

## 2022-01-21 ENCOUNTER — Encounter: Payer: 59 | Admitting: Physical Therapy

## 2022-01-21 DIAGNOSIS — M533 Sacrococcygeal disorders, not elsewhere classified: Secondary | ICD-10-CM | POA: Diagnosis not present

## 2022-01-21 DIAGNOSIS — R2689 Other abnormalities of gait and mobility: Secondary | ICD-10-CM | POA: Diagnosis not present

## 2022-01-21 DIAGNOSIS — R278 Other lack of coordination: Secondary | ICD-10-CM | POA: Diagnosis not present

## 2022-01-21 DIAGNOSIS — M549 Dorsalgia, unspecified: Secondary | ICD-10-CM
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
## 2022-01-21 NOTE — Patient Instructions (Addendum)
Only this week to lower R shoulder    Table top position   Toes tucked, shoulders down and back, on forearms , hands shoulder width apart, fingers straight, elbow back , squeeze imaginary pencils in armpit, shoulder down and away from ears  L hand under shoulder,  R hand at the hips and then rotate to look at behind   10 reps    __ Stand by the wall   Perpendicular   R arm swiping half arc, elbow bent, lower elbow  Hips are squared, Feet  face straight   10 reps   ___  Standing with wider feet under hips

## 2022-01-23 ENCOUNTER — Encounter: Payer: Self-pay | Admitting: Family Medicine

## 2022-01-23 ENCOUNTER — Other Ambulatory Visit: Payer: Self-pay

## 2022-01-23 ENCOUNTER — Encounter: Payer: 59 | Admitting: Physical Therapy

## 2022-01-23 ENCOUNTER — Ambulatory Visit: Payer: 59 | Admitting: Family Medicine

## 2022-01-23 VITALS — BP 96/66 | HR 84 | Temp 98.0°F | Wt 157.8 lb

## 2022-01-23 DIAGNOSIS — H5789 Other specified disorders of eye and adnexa: Secondary | ICD-10-CM | POA: Diagnosis not present

## 2022-01-23 DIAGNOSIS — N952 Postmenopausal atrophic vaginitis: Secondary | ICD-10-CM | POA: Diagnosis not present

## 2022-01-23 DIAGNOSIS — F331 Major depressive disorder, recurrent, moderate: Secondary | ICD-10-CM

## 2022-01-23 MED ORDER — ESCITALOPRAM OXALATE 5 MG PO TABS
ORAL_TABLET | ORAL | 2 refills | Status: DC
  Filled 2022-01-23: qty 30, 30d supply, fill #0

## 2022-01-23 MED ORDER — ERYTHROMYCIN 5 MG/GM OP OINT
1.0000 | TOPICAL_OINTMENT | Freq: Two times a day (BID) | OPHTHALMIC | 0 refills | Status: DC
  Filled 2022-01-23: qty 3.5, 7d supply, fill #0

## 2022-01-23 MED ORDER — PREMARIN 0.625 MG/GM VA CREA
1.0000 | TOPICAL_CREAM | Freq: Every day | VAGINAL | 12 refills | Status: DC
  Filled 2022-01-23: qty 30, 30d supply, fill #0
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
## 2022-01-23 NOTE — Assessment & Plan Note (Signed)
Having more issues. Will get her started on premarin. Recheck 4-6 weeks to see how she's doing.

## 2022-01-23 NOTE — Assessment & Plan Note (Signed)
Not doing well again. Will restart her lexapro and recheck 4-6 weeks. Call with any concerns.

## 2022-01-27 ENCOUNTER — Encounter: Payer: 59 | Admitting: Physical Therapy

## 2022-01-28 ENCOUNTER — Encounter: Payer: 59 | Admitting: Physical Therapy

## 2022-01-29 ENCOUNTER — Ambulatory Visit: Payer: 59 | Admitting: Physical Therapy

## 2022-01-29 DIAGNOSIS — R2689 Other abnormalities of gait and mobility: Secondary | ICD-10-CM

## 2022-01-29 DIAGNOSIS — R278 Other lack of coordination: Secondary | ICD-10-CM

## 2022-01-29 DIAGNOSIS — M533 Sacrococcygeal disorders, not elsewhere classified: Secondary | ICD-10-CM

## 2022-01-29 DIAGNOSIS — M549 Dorsalgia, unspecified: Secondary | ICD-10-CM

## 2022-01-29 NOTE — Therapy (Signed)
OUTPATIENT PHYSICAL THERAPY Treatment    Patient Name: Karen Walker. MRN: [id number] DOB:1971-02-22, 51 y.o., female Today's Date: 01/29/2022   PT End of Session - 01/29/22 0944     Visit Number 5    Number of Visits 10    Date for PT Re-Evaluation 03/03/22    PT Start Time 0940    PT Stop Time 1018    PT Time Calculation (min) 38 min    Activity Tolerance Patient tolerated treatment well;No increased pain    Behavior During Therapy [facility] for tasks assessed/performed             Past Medical History:  Diagnosis Date   Allergic rhinitis    Anxiety    Breast lump on right side at 8 o'clock position 12/06/2014   Depression    Dysplastic nevus 02/14/2020   Mod to severe - close to margin R foot inf to her med maleolus   History of skin cancer    Hx of migraine headaches    light sensivity, unilateral HA   Urticaria    Past Surgical History:  Procedure Laterality Date   BREAST CYST ASPIRATION Right 2013   negative. Done at Dr. Dwyane Luo office   COLONOSCOPY WITH PROPOFOL N/A 07/20/2020   Procedure: COLONOSCOPY WITH PROPOFOL;  Surgeon: Virgel Manifold, MD;  Location: [facility] ENDOSCOPY;  Service: Endoscopy;  Laterality: N/A;  SPANISH INTERPRETER   ESOPHAGOGASTRODUODENOSCOPY (EGD) WITH PROPOFOL N/A 07/20/2020   Procedure: ESOPHAGOGASTRODUODENOSCOPY (EGD) WITH PROPOFOL;  Surgeon: Virgel Manifold, MD;  Location: [facility] ENDOSCOPY;  Service: Endoscopy;  Laterality: N/A;   HERNIA REPAIR  2014   hysterectemy     melonoma removal on R medial arch of foot  2008   TOTAL ABDOMINAL HYSTERECTOMY W/ BILATERAL SALPINGOOPHORECTOMY  2014   TOTAL ABDOMINAL HYSTERECTOMY W/ BILATERAL SALPINGOOPHORECTOMY     UMBILICAL HERNIA REPAIR     occurred with hysterectemy   Patient Active Problem List   Diagnosis Date Noted   Other dysphagia    Schatzki's ring    Screen for colon cancer    Plantar fasciitis 09/06/2019   Spinal stenosis of lumbar region with neurogenic claudication  01/15/2019   Vaginal atrophy 06/24/2017   Multiple allergies 01/13/2017   Major depression, recurrent ([place]) 12/26/2016   Varicose veins of leg with pain, bilateral 12/12/2016   Allergic rhinitis    History of skin cancer     PCP: Park Liter DO  REFERRING PROVIDER: Park Liter DO  REFERRING DIAG: Acute right-sided back pain, unspecified back location  Rationale for Evaluation and Treatment Rehabilitation  THERAPY DIAG:  Acute right-sided back pain, unspecified back location  Other abnormalities of gait and mobility  Other lack of coordination  Sacrococcygeal disorders, not elsewhere classified  ONSET DATE:  over 3 months   SUBJECTIVE:  SUBJECTIVE TODAY 01/29/22   Pt reports pain has decreased from 10/10 to 7/10.  Her back and midback pain are improving.    SUBJECTIVE STATEMENT  EVAL 12/23/21  Over the past 3 months, pt experiences R midback pain and sometimes it goes up her R neck and goes down back of her R leg and it goes numb like pins and needles.  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  6-7/10 on average. At worst 10/10 and she can not sleep. Pt was prescribed steroids medicaiton for the pain but it made her stomach hurt so she stopped taking them. Pt also tried the muscle relaxer but it made breathe fast so she stopped taking them. Relieving factors: pt takes Tylenol and Advil and her husband massages her.     Pain is worst with lifting and pushing at works carts and trays. Pain also hurts around the hips when lying on her side.      One time, pt got a shot for the pain in [place] but it did not help.   Pt was tearful expressing work stress related to bullying and racism from another  employee towards her and another Medical sales representative.  Pt explained this situation has been going on for 2 years. Pt was scheduled to meet with [facility] President last week and has a follow up appt this afternoon. She had already notified her supervisors.   PERTINENT HISTORY:  Hysterectomy, R foot surgery, 5 vaginal deliveries,    PAIN:  Are you having pain? Yes: see above  PRECAUTIONS: None  WEIGHT BEARING RESTRICTIONS No  FALLS:  Has patient fallen in last 6 months? No  LIVING ENVIRONMENT: Lives with: lives with their family Lives in: House/apartment   OCCUPATION:  works in [employer] , food prep   PLOF: Independent  PATIENT GOALS   Feel better   OBJECTIVE:    [facility] PT Assessment - 01/29/22 1155       Coordination   Coordination and Movement Description pelvic pertubation with deep core level 2             [facility] Adult PT Treatment/Exercise - 01/29/22 1155       Neuro Re-ed    Neuro Re-ed Details  cued for new cervical/scapular strengthening in gravilty eliminated position with green band , cued for technique for deep core level 2      Exercises   Other Exercises  see pt instructions              HOME EXERCISE PROGRAM: See pt instruction section    ASSESSMENT:  CLINICAL IMPRESSION:  Pt reports her pain has been improving from 10/10 to 7/10 this past week.  Reviewed  deep core HEP and r provided cues for more pelvic stability in Level 2. Initiated scapular / cervical retraction strengthening in against gravity position which pt required cues for technique and alignment. Anticipate pt will continue to maintain more upright posture and deep core strengthening with these new HEP.  Plan to add multidifis strengthening at next session. Pt benefits from skilled PT.    OBJECTIVE IMPAIRMENTS decreased activity tolerance, decreased coordination, decreased endurance, decreased mobility, difficulty walking, decreased ROM, decreased strength, decreased safety  awareness, hypomobility, increased muscle spasms, impaired flexibility, improper body mechanics, postural dysfunction, and pain   ACTIVITY LIMITATIONS  self-care,  home chores, work tasks    PARTICIPATION LIMITATIONS:  community, home   PERSONAL FACTORS   Stress and physical labor , hysterectomy are also affecting patient's functional outcome.    REHAB POTENTIAL: Good   CLINICAL DECISION MAKING: Evolving/moderate complexity   EVALUATION COMPLEXITY: Moderate    PATIENT EDUCATION:    Education details: Showed pt anatomy images. Explained muscles attachments/ connection, physiology of deep core system/ spinal- thoracic-pelvis-lower kinetic chain as they relate to pt's presentation, Sx, and past Hx. Explained what and how these areas of deficits need to be restored to balance and function    See Therapeutic activity / neuromuscular re-education section  Answered pt's questions.   Person educated: Patient Education method: Explanation, Demonstration, Tactile cues, Verbal cues, and Handouts Education comprehension: verbalized understanding, returned demonstration, verbal cues required, tactile cues required, and needs further education     PLAN: PT FREQUENCY: 1x/week   PT DURATION: 10 weeks   PLANNED INTERVENTIONS: Therapeutic exercises, Therapeutic activity, Neuromuscular re-education, Balance training, Gait training, Patient/Family education, Self Care, Joint mobilization, Spinal mobilization, Moist heat, Taping, and Manual therapy.   PLAN FOR NEXT SESSION: See clinical impression for plan     GOALS: Goals reviewed with patient? Yes  SHORT TERM GOALS: Target date: 01/21/2022    Pt will demo IND with HEP                    Baseline: Not IND            Goal status: INITIAL   LONG TERM GOALS: Target date: 03/03/2022    1.Pt will demo proper deep core coordination without chest breathing and optimal excursion of diaphragm/pelvic floor in order to promote spinal  stability and pelvic floor function  Baseline: dyscoordination Goal status: INITIAL  2.  Pt will demo > 5 pt change on FOTO  to improve QOL and function  Lumbar FOTO baseline  Goal status: INITIAL  3.  Pt will demo proper body mechanics in against gravity tasks and ADLs  work tasks, fitness  to minimize straining pelvic floor / back                  Baseline: not IND, improper form that places strain on pelvic floor                Goal status: INITIAL    4. Pt will demo no RLE radiating pain with L sidebend and rotation in order to perform work activities with lifting, bending, cooking  Baseline: L sidebend and rotation of the trunk caused R radiating pain down the back of her foot,  Goal status: INITIAL    5. Pt will report no episode of 10/10 pain that prevents her from sleeping across one month in order to indicate improved management of pain and improve sleep quality  Baseline: 10/10 pain that prevents her from sleeping Goal status: INITIAL       [name], PT 01/29/2022, 9:45 AM

## 2022-01-29 NOTE — Patient Instructions (Signed)
Lying on back, knees bent    band under ballmounds  while laying on back w/ knees bent  "W" exercise  10 reps x 2 sets   Band is placed under feet, knees bent, feet are hip width apart Hold band with thumbs point out, keep upper arm and elbow touching the bed the whole time  - inhale and then exhale pull bands by bending elbows hands move in a "w"  (feel shoulder blades squeezing)   ________________

## 2022-01-30 ENCOUNTER — Encounter: Payer: 59 | Admitting: Physical Therapy

## 2022-01-30 ENCOUNTER — Other Ambulatory Visit: Payer: Self-pay

## 2022-02-03 ENCOUNTER — Encounter (INDEPENDENT_AMBULATORY_CARE_PROVIDER_SITE_OTHER): Payer: Self-pay

## 2022-02-03 ENCOUNTER — Encounter: Payer: 59 | Admitting: Physical Therapy

## 2022-02-04 ENCOUNTER — Encounter: Payer: 59 | Admitting: Physical Therapy

## 2022-02-05 ENCOUNTER — Ambulatory Visit: Payer: 59 | Attending: Family Medicine | Admitting: Physical Therapy

## 2022-02-05 DIAGNOSIS — M533 Sacrococcygeal disorders, not elsewhere classified: Secondary | ICD-10-CM | POA: Diagnosis not present

## 2022-02-05 DIAGNOSIS — R278 Other lack of coordination: Secondary | ICD-10-CM | POA: Diagnosis not present

## 2022-02-05 DIAGNOSIS — M549 Dorsalgia, unspecified: Secondary | ICD-10-CM | POA: Diagnosis not present

## 2022-02-05 DIAGNOSIS — R2689 Other abnormalities of gait and mobility: Secondary | ICD-10-CM | POA: Diagnosis not present

## 2022-02-05 NOTE — Therapy (Signed)
OUTPATIENT PHYSICAL THERAPY Treatment    Patient Name: Timmy Cleverly. MRN: [id number] DOB:08/05/1970, 51 y.o., female Today's Date: 02/05/2022   PT End of Session - 02/05/22 0950     Visit Number 6    Number of Visits 10    Date for PT Re-Evaluation 03/03/22    PT Start Time 0847    PT Stop Time 0932    PT Time Calculation (min) 45 min    Activity Tolerance Patient tolerated treatment well;No increased pain    Behavior During Therapy [facility] for tasks assessed/performed             Past Medical History:  Diagnosis Date   Allergic rhinitis    Anxiety    Breast lump on right side at 8 o'clock position 12/06/2014   Depression    Dysplastic nevus 02/14/2020   Mod to severe - close to margin R foot inf to her med maleolus   History of skin cancer    Hx of migraine headaches    light sensivity, unilateral HA   Urticaria    Past Surgical History:  Procedure Laterality Date   BREAST CYST ASPIRATION Right 2013   negative. Done at Dr. Dwyane Luo office   COLONOSCOPY WITH PROPOFOL N/A 07/20/2020   Procedure: COLONOSCOPY WITH PROPOFOL;  Surgeon: Virgel Manifold, MD;  Location: [facility] ENDOSCOPY;  Service: Endoscopy;  Laterality: N/A;  SPANISH INTERPRETER   ESOPHAGOGASTRODUODENOSCOPY (EGD) WITH PROPOFOL N/A 07/20/2020   Procedure: ESOPHAGOGASTRODUODENOSCOPY (EGD) WITH PROPOFOL;  Surgeon: Virgel Manifold, MD;  Location: [facility] ENDOSCOPY;  Service: Endoscopy;  Laterality: N/A;   HERNIA REPAIR  2014   hysterectemy     melonoma removal on R medial arch of foot  2008   TOTAL ABDOMINAL HYSTERECTOMY W/ BILATERAL SALPINGOOPHORECTOMY  2014   TOTAL ABDOMINAL HYSTERECTOMY W/ BILATERAL SALPINGOOPHORECTOMY     UMBILICAL HERNIA REPAIR     occurred with hysterectemy   Patient Active Problem List   Diagnosis Date Noted   Other dysphagia    Schatzki's ring    Screen for colon cancer    Plantar fasciitis 09/06/2019   Spinal stenosis of lumbar region with neurogenic claudication 01/15/2019    Vaginal atrophy 06/24/2017   Multiple allergies 01/13/2017   Major depression, recurrent ([place]) 12/26/2016   Varicose veins of leg with pain, bilateral 12/12/2016   Allergic rhinitis    History of skin cancer     PCP: Park Liter DO  REFERRING PROVIDER: Park Liter DO  REFERRING DIAG: Acute right-sided back pain, unspecified back location  Rationale for Evaluation and Treatment Rehabilitation  THERAPY DIAG:  Acute right-sided back pain, unspecified back location  Other abnormalities of gait and mobility  Other lack of coordination  Sacrococcygeal disorders, not elsewhere classified  ONSET DATE:  over 3 months   SUBJECTIVE:  SUBJECTIVE TODAY 02/05/22   Pt reports she only no migraines across 2 weeks when typically she gets 2 per week. Pt also has been sleeping much better through the night without pain.    SUBJECTIVE STATEMENT  EVAL 12/23/21  Over the past 3 months, pt experiences R midback pain and sometimes it goes up her R neck and goes down back of her R leg and it goes numb like pins and needles.  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  6-7/10 on average. At worst 10/10 and she can not sleep. Pt was prescribed steroids medicaiton for the pain but it made her stomach hurt so she stopped taking them. Pt also tried the muscle relaxer but it made breathe fast so she stopped taking them. Relieving factors: pt takes Tylenol and Advil and her husband massages her.     Pain is worst with lifting and pushing at works carts and trays. Pain also hurts around the hips when lying on her side.      One time, pt got a shot for the pain in [place] but it did not help.   Pt was tearful expressing work stress  related to bullying and racism from another employee towards her and another Medical sales representative.  Pt explained this situation has been going on for 2 years. Pt was scheduled to meet with [facility] President last week and has a follow up appt this afternoon. She had already notified her supervisors.   PERTINENT HISTORY:  Hysterectomy, R foot surgery, 5 vaginal deliveries,    PAIN:  Are you having pain? Yes: see above  PRECAUTIONS: None  WEIGHT BEARING RESTRICTIONS No  FALLS:  Has patient fallen in last 6 months? No  LIVING ENVIRONMENT: Lives with: lives with their family Lives in: House/apartment   OCCUPATION:  works in [employer] , food prep   PLOF: Independent  PATIENT GOALS   Feel better   OBJECTIVE:    [facility] PT Assessment - 02/05/22 0952       Palpation   Spinal mobility tenderness/ hypomobility at C7-T1, intercostals T7-10 R, medial scap/ suprascapular, interspinals, paraspinals R , occiput, UE chain             [facility] Adult PT Treatment/Exercise - 02/05/22 0954       Neuro Re-ed    Neuro Re-ed Details  cued for 6 directions of neck      Manual Therapy   Manual therapy comments STM/MWM along areas noted in assessment to promote mobility at spine and minimize asymmetry of shoulder/ spine, increase mobility at R cervical spine and UE chain , thoracic                 HOME EXERCISE PROGRAM: See pt instruction section    ASSESSMENT:  CLINICAL IMPRESSION:   Pt reports she only no migraines across 2 weeks when typically she gets 2 per week. Pt also has been sleeping much better through the night without pain. These improvements include progress with more medially aligned spine. Focused today on improving mobility at C/T junction, intercostals, thoracic area on R and UE chain. Pt showed improved mobility and anticipate today's focus will help medially align C/T/ junction.  Plan to add multidifis strengthening at next session. Pt benefits from skilled PT.     OBJECTIVE IMPAIRMENTS decreased activity tolerance, decreased coordination, decreased endurance, decreased mobility, difficulty walking, decreased ROM,  decreased strength, decreased safety awareness, hypomobility, increased muscle spasms, impaired flexibility, improper body mechanics, postural dysfunction, and pain   ACTIVITY LIMITATIONS  self-care,  home chores, work tasks    PARTICIPATION LIMITATIONS:  community, home   [place] and physical labor , hysterectomy are also affecting patient's functional outcome.    REHAB POTENTIAL: Good   CLINICAL DECISION MAKING: Evolving/moderate complexity   EVALUATION COMPLEXITY: Moderate    PATIENT EDUCATION:    Education details: Showed pt anatomy images. Explained muscles attachments/ connection, physiology of deep core system/ spinal- thoracic-pelvis-lower kinetic chain as they relate to pt's presentation, Sx, and past Hx. Explained what and how these areas of deficits need to be restored to balance and function    See Therapeutic activity / neuromuscular re-education section  Answered pt's questions.   Person educated: Patient Education method: Explanation, Demonstration, Tactile cues, Verbal cues, and Handouts Education comprehension: verbalized understanding, returned demonstration, verbal cues required, tactile cues required, and needs further education     PLAN: PT FREQUENCY: 1x/week   PT DURATION: 10 weeks   PLANNED INTERVENTIONS: Therapeutic exercises, Therapeutic activity, Neuromuscular re-education, Balance training, Gait training, Patient/Family education, Self Care, Joint mobilization, Spinal mobilization, Moist heat, Taping, and Manual therapy.   PLAN FOR NEXT SESSION: See clinical impression for plan     GOALS: Goals reviewed with patient? Yes  SHORT TERM GOALS: Target date: 01/21/2022    Pt will demo IND with HEP                    Baseline: Not IND            Goal status: INITIAL   LONG  TERM GOALS: Target date: 03/03/2022    1.Pt will demo proper deep core coordination without chest breathing and optimal excursion of diaphragm/pelvic floor in order to promote spinal stability and pelvic floor function  Baseline: dyscoordination Goal status: INITIAL  2.  Pt will demo > 5 pt change on FOTO  to improve QOL and function  Lumbar FOTO baseline  Goal status: INITIAL  3.  Pt will demo proper body mechanics in against gravity tasks and ADLs  work tasks, fitness  to minimize straining pelvic floor / back                  Baseline: not IND, improper form that places strain on pelvic floor                Goal status: INITIAL    4. Pt will demo no RLE radiating pain with L sidebend and rotation in order to perform work activities with lifting, bending, cooking  Baseline: L sidebend and rotation of the trunk caused R radiating pain down the back of her foot,  Goal status: INITIAL    5. Pt will report no episode of 10/10 pain that prevents her from sleeping across one month in order to indicate improved management of pain and improve sleep quality  Baseline: 10/10 pain that prevents her from sleeping Goal status: INITIAL       [name], PT 02/05/2022, 9:51 AM

## 2022-02-06 ENCOUNTER — Encounter: Payer: 59 | Admitting: Physical Therapy

## 2022-02-10 ENCOUNTER — Encounter: Payer: 59 | Admitting: Physical Therapy

## 2022-02-12 ENCOUNTER — Ambulatory Visit: Payer: 59 | Admitting: Physical Therapy

## 2022-02-12 ENCOUNTER — Encounter: Payer: 59 | Admitting: Physical Therapy

## 2022-02-12 DIAGNOSIS — M549 Dorsalgia, unspecified: Secondary | ICD-10-CM | POA: Diagnosis not present

## 2022-02-12 DIAGNOSIS — M533 Sacrococcygeal disorders, not elsewhere classified: Secondary | ICD-10-CM | POA: Diagnosis not present

## 2022-02-12 DIAGNOSIS — R278 Other lack of coordination: Secondary | ICD-10-CM | POA: Diagnosis not present

## 2022-02-12 DIAGNOSIS — R2689 Other abnormalities of gait and mobility: Secondary | ICD-10-CM | POA: Diagnosis not present

## 2022-02-12 NOTE — Therapy (Signed)
OUTPATIENT PHYSICAL THERAPY Treatment    Patient Name: Karen Walker. MRN: [id number] DOB:20-Apr-1970, 51 y.o., female Today's Date: 02/12/2022   PT End of Session - 02/12/22 0813     Visit Number 7    Number of Visits 10    Date for PT Re-Evaluation 03/03/22    PT Start Time 0812    PT Stop Time 8295    PT Time Calculation (min) 43 min    Activity Tolerance Patient tolerated treatment well;No increased pain    Behavior During Therapy [facility] for tasks assessed/performed             Past Medical History:  Diagnosis Date   Allergic rhinitis    Anxiety    Breast lump on right side at 8 o'clock position 12/06/2014   Depression    Dysplastic nevus 02/14/2020   Mod to severe - close to margin R foot inf to her med maleolus   History of skin cancer    Hx of migraine headaches    light sensivity, unilateral HA   Urticaria    Past Surgical History:  Procedure Laterality Date   BREAST CYST ASPIRATION Right 2013   negative. Done at Dr. Dwyane Luo office   COLONOSCOPY WITH PROPOFOL N/A 07/20/2020   Procedure: COLONOSCOPY WITH PROPOFOL;  Surgeon: Virgel Manifold, MD;  Location: [facility] ENDOSCOPY;  Service: Endoscopy;  Laterality: N/A;  SPANISH INTERPRETER   ESOPHAGOGASTRODUODENOSCOPY (EGD) WITH PROPOFOL N/A 07/20/2020   Procedure: ESOPHAGOGASTRODUODENOSCOPY (EGD) WITH PROPOFOL;  Surgeon: Virgel Manifold, MD;  Location: [facility] ENDOSCOPY;  Service: Endoscopy;  Laterality: N/A;   HERNIA REPAIR  2014   hysterectemy     melonoma removal on R medial arch of foot  2008   TOTAL ABDOMINAL HYSTERECTOMY W/ BILATERAL SALPINGOOPHORECTOMY  2014   TOTAL ABDOMINAL HYSTERECTOMY W/ BILATERAL SALPINGOOPHORECTOMY     UMBILICAL HERNIA REPAIR     occurred with hysterectemy   Patient Active Problem List   Diagnosis Date Noted   Other dysphagia    Schatzki's ring    Screen for colon cancer    Plantar fasciitis 09/06/2019   Spinal stenosis of lumbar region with neurogenic claudication 01/15/2019    Vaginal atrophy 06/24/2017   Multiple allergies 01/13/2017   Major depression, recurrent ([place]) 12/26/2016   Varicose veins of leg with pain, bilateral 12/12/2016   Allergic rhinitis    History of skin cancer     PCP: Park Liter DO  REFERRING PROVIDER: Park Liter DO  REFERRING DIAG: Acute right-sided back pain, unspecified back location  Rationale for Evaluation and Treatment Rehabilitation  THERAPY DIAG:  Acute right-sided back pain, unspecified back location  Other abnormalities of gait and mobility  Other lack of coordination  Sacrococcygeal disorders, not elsewhere classified  ONSET DATE:  over 3 months   SUBJECTIVE:  SUBJECTIVE TODAY 02/12/22   Pt reports radiating pain now only goes to mid posterior thigh. She feels pain at neck .   SUBJECTIVE STATEMENT  EVAL 12/23/21  Over the past 3 months, pt experiences R midback pain and sometimes it goes up her R neck and goes down back of her R leg and it goes numb like pins and needles.  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  6-7/10 on average. At worst 10/10 and she can not sleep. Pt was prescribed steroids medicaiton for the pain but it made her stomach hurt so she stopped taking them. Pt also tried the muscle relaxer but it made breathe fast so she stopped taking them. Relieving factors: pt takes Tylenol and Advil and her husband massages her.     Pain is worst with lifting and pushing at works carts and trays. Pain also hurts around the hips when lying on her side.      One time, pt got a shot for the pain in [place] but it did not help.   Pt was tearful expressing work stress related to bullying and racism from another employee towards her  and another Medical sales representative.  Pt explained this situation has been going on for 2 years. Pt was scheduled to meet with [facility] President last week and has a follow up appt this afternoon. She had already notified her supervisors.   PERTINENT HISTORY:  Hysterectomy, R foot surgery, 5 vaginal deliveries,    PAIN:  Are you having pain? Yes: see above  PRECAUTIONS: None  WEIGHT BEARING RESTRICTIONS No  FALLS:  Has patient fallen in last 6 months? No  LIVING ENVIRONMENT: Lives with: lives with their family Lives in: House/apartment   OCCUPATION:  works in [employer] , food prep   PLOF: Independent  PATIENT GOALS   Feel better   OBJECTIVE:     [facility] PT Assessment - 02/12/22 0915       Coordination   Coordination and Movement Description upper trap overuse with scapular coordination      Palpation   Palpation comment hypomobility at C/T, T3-5 R deviation , tightness along interspinals/ intercostals R             [facility] Adult PT Treatment/Exercise - 02/12/22 0913       Neuro Re-ed    Neuro Re-ed Details  cued for cervico-scapular isometric HEP      Modalities   Modalities Moist Heat      Moist Heat Therapy   Number Minutes Moist Heat 5 Minutes    Moist Heat Location --   cervico-thoracic, L lower trunk rotation to lengthen R thoracic ( unbilled)     Manual Therapy   Manual therapy comments STM/MWM along areas noted in assessment to minimize C/T junction hypomobilie and medially realign at T3-5               HOME EXERCISE PROGRAM: See pt instruction section    ASSESSMENT:  CLINICAL IMPRESSION:   Further manual Tx was provided to will help medially align C/T/ junction. Added isometrics strengthening. Pt required cues for less upper trap and more coordination of scapular/ cervical retraction.   Plan to add multidifis strengthening at next session. Pt benefits from skilled PT.    OBJECTIVE IMPAIRMENTS decreased activity tolerance, decreased  coordination, decreased endurance, decreased mobility, difficulty walking, decreased ROM, decreased strength, decreased safety  awareness, hypomobility, increased muscle spasms, impaired flexibility, improper body mechanics, postural dysfunction, and pain   ACTIVITY LIMITATIONS  self-care,  home chores, work tasks    PARTICIPATION LIMITATIONS:  community, home   [place] and physical labor , hysterectomy are also affecting patient's functional outcome.    REHAB POTENTIAL: Good   CLINICAL DECISION MAKING: Evolving/moderate complexity   EVALUATION COMPLEXITY: Moderate    PATIENT EDUCATION:    Education details: Showed pt anatomy images. Explained muscles attachments/ connection, physiology of deep core system/ spinal- thoracic-pelvis-lower kinetic chain as they relate to pt's presentation, Sx, and past Hx. Explained what and how these areas of deficits need to be restored to balance and function    See Therapeutic activity / neuromuscular re-education section  Answered pt's questions.   Person educated: Patient Education method: Explanation, Demonstration, Tactile cues, Verbal cues, and Handouts Education comprehension: verbalized understanding, returned demonstration, verbal cues required, tactile cues required, and needs further education     PLAN: PT FREQUENCY: 1x/week   PT DURATION: 10 weeks   PLANNED INTERVENTIONS: Therapeutic exercises, Therapeutic activity, Neuromuscular re-education, Balance training, Gait training, Patient/Family education, Self Care, Joint mobilization, Spinal mobilization, Moist heat, Taping, and Manual therapy.   PLAN FOR NEXT SESSION: See clinical impression for plan     GOALS: Goals reviewed with patient? Yes  SHORT TERM GOALS: Target date: 01/21/2022    Pt will demo IND with HEP                    Baseline: Not IND            Goal status: INITIAL   LONG TERM GOALS: Target date: 03/03/2022    1.Pt will demo proper  deep core coordination without chest breathing and optimal excursion of diaphragm/pelvic floor in order to promote spinal stability and pelvic floor function  Baseline: dyscoordination Goal status: INITIAL  2.  Pt will demo > 5 pt change on FOTO  to improve QOL and function  Lumbar FOTO baseline  Goal status: INITIAL  3.  Pt will demo proper body mechanics in against gravity tasks and ADLs  work tasks, fitness  to minimize straining pelvic floor / back                  Baseline: not IND, improper form that places strain on pelvic floor                Goal status: INITIAL    4. Pt will demo no RLE radiating pain with L sidebend and rotation in order to perform work activities with lifting, bending, cooking  Baseline: L sidebend and rotation of the trunk caused R radiating pain down the back of her foot,  Goal status: INITIAL    5. Pt will report no episode of 10/10 pain that prevents her from sleeping across one month in order to indicate improved management of pain and improve sleep quality  Baseline: 10/10 pain that prevents her from sleeping Goal status: INITIAL       [name], PT 02/12/2022, 8:14 AM

## 2022-02-12 NOTE — Patient Instructions (Addendum)
Double chin, lil Cricket   On belly, palms under armpits, elbows pointed to ceiling  Inhale: lengthen crown of the head away from shoulders Exhale, feel belly hug in and press palms into the floor, squeezing elbows/shoulder blades towards each other while chest lifts about 5 cm off of the floor. You should feel the hinging movement at mid back and not the low back.   20 rep __    [place]ust pose  Pillow under hips if needed for decreased low back pain  Palms face midline by hips  Finger tips shooting straight down  Imagine holding pencil under your armpits Draw shoulders away from ears Inhale Exhale lift chest up slightly without feeling it in your back. The bend happens in the midback  (keep chin tucked)

## 2022-02-17 ENCOUNTER — Ambulatory Visit: Payer: 59 | Admitting: Physical Therapy

## 2022-03-04 ENCOUNTER — Ambulatory Visit: Payer: 59 | Admitting: Family Medicine

## 2022-03-05 ENCOUNTER — Ambulatory Visit: Payer: 59 | Admitting: Physical Therapy

## 2022-03-14 ENCOUNTER — Ambulatory Visit: Payer: 59 | Admitting: Family Medicine

## 2022-04-30 ENCOUNTER — Ambulatory Visit: Payer: Commercial Managed Care - PPO | Attending: Family Medicine | Admitting: Physical Therapy

## 2022-04-30 DIAGNOSIS — M533 Sacrococcygeal disorders, not elsewhere classified: Secondary | ICD-10-CM | POA: Diagnosis not present

## 2022-04-30 DIAGNOSIS — M549 Dorsalgia, unspecified: Secondary | ICD-10-CM | POA: Diagnosis not present

## 2022-04-30 DIAGNOSIS — R278 Other lack of coordination: Secondary | ICD-10-CM | POA: Insufficient documentation

## 2022-04-30 DIAGNOSIS — R2689 Other abnormalities of gait and mobility: Secondary | ICD-10-CM | POA: Insufficient documentation

## 2022-04-30 NOTE — Therapy (Signed)
OUTPATIENT PHYSICAL THERAPY Treatment / Recert    Patient Name: Karen Walker. MRN: [id number] DOB:[birth year], 52 y.o., female Today's Date: 04/30/2022   PT End of Session - 04/30/22 0939     Visit Number 8    Number of Visits 10    Date for PT Re-Evaluation 07/09/22    PT Start Time 0935    PT Stop Time 1015    PT Time Calculation (min) 40 min    Activity Tolerance Patient tolerated treatment well;No increased pain    Behavior During Therapy [facility] for tasks assessed/performed             Past Medical History:  Diagnosis Date   Allergic rhinitis    Anxiety    Breast lump on right side at 8 o'clock position 12/06/2014   Depression    Dysplastic nevus 02/14/2020   Mod to severe - close to margin R foot inf to her med maleolus   History of skin cancer    Hx of migraine headaches    light sensivity, unilateral HA   Urticaria    Past Surgical History:  Procedure Laterality Date   BREAST CYST ASPIRATION Right 2013   negative. Done at Dr. Dwyane Luo office   COLONOSCOPY WITH PROPOFOL N/A 07/20/2020   Procedure: COLONOSCOPY WITH PROPOFOL;  Surgeon: Virgel Manifold, MD;  Location: [facility] ENDOSCOPY;  Service: Endoscopy;  Laterality: N/A;  SPANISH INTERPRETER   ESOPHAGOGASTRODUODENOSCOPY (EGD) WITH PROPOFOL N/A 07/20/2020   Procedure: ESOPHAGOGASTRODUODENOSCOPY (EGD) WITH PROPOFOL;  Surgeon: Virgel Manifold, MD;  Location: [facility] ENDOSCOPY;  Service: Endoscopy;  Laterality: N/A;   HERNIA REPAIR  2014   hysterectemy     melonoma removal on R medial arch of foot  2008   TOTAL ABDOMINAL HYSTERECTOMY W/ BILATERAL SALPINGOOPHORECTOMY  2014   TOTAL ABDOMINAL HYSTERECTOMY W/ BILATERAL SALPINGOOPHORECTOMY     UMBILICAL HERNIA REPAIR     occurred with hysterectemy   Patient Active Problem List   Diagnosis Date Noted   Other dysphagia    Schatzki's ring    Screen for colon cancer    Plantar fasciitis 09/06/2019   Spinal stenosis of lumbar region with neurogenic claudication  01/15/2019   Vaginal atrophy 06/24/2017   Multiple allergies 01/13/2017   Major depression, recurrent ([place]) 12/26/2016   Varicose veins of leg with pain, bilateral 12/12/2016   Allergic rhinitis    History of skin cancer     PCP: Park Liter DO  REFERRING PROVIDER: Park Liter DO  REFERRING DIAG: Acute right-sided back pain, unspecified back location  Rationale for Evaluation and Treatment Rehabilitation  THERAPY DIAG:  Other abnormalities of gait and mobility - Plan: PT plan of care cert/re-cert  Acute right-sided back pain, unspecified back location - Plan: PT plan of care cert/re-cert  Other lack of coordination - Plan: PT plan of care cert/re-cert  Sacrococcygeal disorders, not elsewhere classified - Plan: PT plan of care cert/re-cert  ONSET DATE:  over 3 months   SUBJECTIVE:  SUBJECTIVE TODAY   Pt reports LBP radiating pain has been occurring less and is improved by 75%. Pt notices it when pushing cart at work  Within the past 3 weeks, pt notice numbness along her pinky and ring fingers on B UE and at wrists after working her shift.  She has pain in B hands all day long.  7/10   SUBJECTIVE STATEMENT  EVAL 12/23/21  Over the past 3 months, pt experiences R midback pain and sometimes it goes up her R neck and goes down back of her R leg and it goes numb like pins and needles.  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  6-7/10 on average. At worst 10/10 and she can not sleep. Pt was prescribed steroids medicaiton for the pain but it made her stomach hurt so she stopped taking them. Pt also tried the muscle relaxer but it made breathe fast so she stopped taking them. Relieving factors: pt takes Tylenol  and Advil and her husband massages her.     Pain is worst with lifting and pushing at works carts and trays. Pain also hurts around the hips when lying on her side.      One time, pt got a shot for the pain in [place] but it did not help.   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  Pt explained this situation has been going on for 2 years. Pt was scheduled to meet with [facility] President last week and has a follow up appt this afternoon. She had already notified her supervisors.   PERTINENT HISTORY:  Hysterectomy, R foot surgery, 5 vaginal deliveries,    PAIN:  Are you having pain? Yes: see above  PRECAUTIONS: None  WEIGHT BEARING RESTRICTIONS No  FALLS:  Has patient fallen in last 6 months? No  LIVING ENVIRONMENT: Lives with: lives with their family Lives in: House/apartment   OCCUPATION:  works in [employer] , food prep   PLOF: Independent  PATIENT GOALS   Feel better   OBJECTIVE:   [facility] PT Assessment - 04/30/22 0947       Sensation   Additional Comments dermatome C2, C7, T1 less on RUE      Other:   Other/Comments overuse of uper body, shoulders with pushing cart at work      Palpation   Spinal mobility radiating pain to back of knee with R/L rotation and sidebend    SI assessment  levelled shoulders/ pelvis.  C/T hypomobile,    Palpation comment hypomobility at C/T, T3-5             [facility] PT Assessment - 04/30/22 0947       Sensation   Additional Comments dermatome C2, C7, T1 less on RUE      Other:   Other/Comments overuse of uper body, shoulders with pushing cart at work      Palpation   Spinal mobility radiating pain to back of knee with R/L rotation and sidebend    SI assessment  levelled shoulders/ pelvis.  C/T hypomobile,    Palpation comment hypomobility at C/T, T3-5             [facility] PT Assessment - 04/30/22 0947       Sensation    Additional Comments dermatome C2, C7, T1 less on  RUE      Other:   Other/Comments overuse of uper body, shoulders with pushing cart at work      Palpation   Spinal mobility radiating pain to back of knee with R/L rotation and sidebend    SI assessment  levelled shoulders/ pelvis.  C/T hypomobile,    Palpation comment hypomobility at C/T, T3-5             [facility] PT Assessment - 04/30/22 0947       Sensation   Additional Comments dermatome C2, C7, T1 less on RUE      Other:   Other/Comments overuse of uper body, shoulders with pushing cart at work      Palpation   Spinal mobility radiating pain to back of knee with R/L rotation and sidebend    SI assessment  levelled shoulders/ pelvis.  C/T hypomobile,    Palpation comment hypomobility at C/T, T3-5             [facility] Adult PT Treatment/Exercise - 04/30/22 1811       Therapeutic Activites    Other Therapeutic Activities pushing carts at work with lower kinetic chain showed body mechanics      Neuro Re-ed    Neuro Re-ed Details  cued for multidifis , lower knietic chain activation when pushing carts at work      Manual Therapy   Manual therapy comments STM/MWM along areas noted in assessment to minimize C/T junction hypomobilie and medially realign at L T3-5              [place]: See pt instruction section    ASSESSMENT:  CLINICAL IMPRESSION:  Pt has achieved 2/6 goals and demo'd improved pelvic and shoulder alignment and less forward head.   Pt reports LBP radiating pain has been occurring less and is improved by 75%. Pt notices it when pushing cart at work and thus, addressed body mechanics with use of lower knietic chain for propulsion and less upper body and shoulders.    Within the past 3 weeks, pt notice numbness along her pinky and ring fingers on B UE and at wrists after working her shift.  She has pain in B hands all day long.  7/10   Today's assessed showed remaining hypomobility at  C/T junction.  Further manual Tx was provided to will help medially align C/T/ junction. Added multidifis strengthening but it caused pain at wrists and elbows despite cues for not holding band with flexed fingers. Plan to provide alternatives. Plan to educate for use of ice and heat after working at wrist and elbow joints. Pt continues to benefit from skilled PT to achieve remaining goals.    OBJECTIVE IMPAIRMENTS decreased activity tolerance, decreased coordination, decreased endurance, decreased mobility, difficulty walking, decreased ROM, decreased strength, decreased safety awareness, hypomobility, increased muscle spasms, impaired flexibility, improper body mechanics, postural dysfunction, and pain   ACTIVITY LIMITATIONS  self-care,  home chores, work tasks    PARTICIPATION LIMITATIONS:  community, home   PERSONAL FACTORS   Stress and physical labor , hysterectomy are also affecting patient's functional outcome.    REHAB POTENTIAL: Good   CLINICAL DECISION MAKING: Evolving/moderate complexity   EVALUATION COMPLEXITY: Moderate    PATIENT EDUCATION:    Education details: Showed pt anatomy images. Explained muscles attachments/ connection, physiology of deep core system/ spinal- thoracic-pelvis-lower kinetic chain as they relate to pt's presentation, Sx, and past Hx. Explained what and how these areas of deficits need to be restored  to balance and function    See Therapeutic activity / neuromuscular re-education section  Answered pt's questions.   Person educated: Patient Education method: Explanation, Demonstration, Tactile cues, Verbal cues, and Handouts Education comprehension: verbalized understanding, returned demonstration, verbal cues required, tactile cues required, and needs further education     PLAN: PT FREQUENCY: 1x/week   PT DURATION: 10 weeks   PLANNED INTERVENTIONS: Therapeutic exercises, Therapeutic activity, Neuromuscular re-education, Balance training, Gait  training, Patient/Family education, Self Care, Joint mobilization, Spinal mobilization, Moist heat, Taping, and Manual therapy.   PLAN FOR NEXT SESSION: See clinical impression for plan     GOALS: Goals reviewed with patient? Yes  SHORT TERM GOALS: Target date: 01/21/2022    Pt will demo IND with HEP                    Baseline: Not IND            Goal status: MET   LONG TERM GOALS: Target date: 07/09/2022      1.Pt will demo proper deep core coordination without chest breathing and optimal excursion of diaphragm/pelvic floor in order to promote spinal stability and pelvic floor function  Baseline: dyscoordination Goal status: MET  2.  Pt will demo > 5 pt change on FOTO  to improve QOL and function  Lumbar FOTO baseline  Goal status: ongoing   3.  Pt will demo proper body mechanics in against gravity tasks and ADLs  work tasks, fitness  to minimize straining pelvic floor / back                  Baseline: not IND, improper form that places strain on pelvic floor                Goal status:Partially MET    4. Pt will demo no RLE radiating pain with L sidebend and rotation in order to perform work activities with lifting, bending, cooking  Baseline: L sidebend and rotation of the trunk caused R radiating pain down the back of her foot,  (04/30/22: B radiating to back of knee with rotation and sidebend in  both directions)  Goal status: ON going      5. Pt will report no episode of 10/10 pain that prevents her from sleeping across one month in order to indicate improved management of pain and improve sleep quality  Baseline: 10/10 pain that prevents her from sleeping Goal status:  MET without LBP pain but now experiences numbness /. Tingling in BUE which interrupts her sleep ( 04/30/22)   6.  Pt will report decreased  numbness along pinky and ring finger from 7/10 to < 3/10 in order to perform work tasks  Baseline: 7/10  Goal status: NEW       [name],  PT 04/30/2022, 6:23 PM

## 2022-04-30 NOTE — Patient Instructions (Signed)
Neck roll with towel  Angel wings , dragging arms on bed , up to shoulders only and drag low by ribs  10 reps  __   Pushing cart with leg/ foot, shoulders squeezing down back

## 2022-05-07 ENCOUNTER — Ambulatory Visit: Payer: Commercial Managed Care - PPO | Admitting: Physical Therapy

## 2022-05-14 ENCOUNTER — Ambulatory Visit: Payer: Commercial Managed Care - PPO | Admitting: Physical Therapy

## 2022-05-15 ENCOUNTER — Ambulatory Visit: Payer: 59 | Admitting: Dermatology

## 2022-05-21 ENCOUNTER — Ambulatory Visit: Payer: Commercial Managed Care - PPO | Attending: Family Medicine | Admitting: Physical Therapy

## 2022-05-21 DIAGNOSIS — R2689 Other abnormalities of gait and mobility: Secondary | ICD-10-CM | POA: Diagnosis not present

## 2022-05-21 DIAGNOSIS — R278 Other lack of coordination: Secondary | ICD-10-CM | POA: Diagnosis not present

## 2022-05-21 DIAGNOSIS — M533 Sacrococcygeal disorders, not elsewhere classified: Secondary | ICD-10-CM | POA: Insufficient documentation

## 2022-05-21 DIAGNOSIS — M549 Dorsalgia, unspecified: Secondary | ICD-10-CM | POA: Insufficient documentation

## 2022-05-21 NOTE — Therapy (Signed)
OUTPATIENT PHYSICAL THERAPY Treatment / Recert    Patient Name: Karen Walker. MRN: [id number] DOB:02/28/1971, 52 y.o., female Today's Date: 05/21/2022   PT End of Session - 05/21/22 0844     Visit Number 9    Number of Visits 10    Date for PT Re-Evaluation 07/09/22    PT Start Time 0828    PT Stop Time 0900    PT Time Calculation (min) 32 min    Activity Tolerance Patient tolerated treatment well;No increased pain    Behavior During Therapy [facility] for tasks assessed/performed             Past Medical History:  Diagnosis Date   Allergic rhinitis    Anxiety    Breast lump on right side at 8 o'clock position 12/06/2014   Depression    Dysplastic nevus 02/14/2020   Mod to severe - close to margin R foot inf to her med maleolus   History of skin cancer    Hx of migraine headaches    light sensivity, unilateral HA   Urticaria    Past Surgical History:  Procedure Laterality Date   BREAST CYST ASPIRATION Right 2013   negative. Done at Dr. Dwyane Luo office   COLONOSCOPY WITH PROPOFOL N/A 07/20/2020   Procedure: COLONOSCOPY WITH PROPOFOL;  Surgeon: Virgel Manifold, MD;  [facility] ENDOSCOPY;  Service: Endoscopy;  Laterality: N/A;  SPANISH INTERPRETER   ESOPHAGOGASTRODUODENOSCOPY (EGD) WITH PROPOFOL N/A 07/20/2020   Procedure: ESOPHAGOGASTRODUODENOSCOPY (EGD) WITH PROPOFOL;  Surgeon: Virgel Manifold, MD;  Location: [facility] ENDOSCOPY;  Service: Endoscopy;  Laterality: N/A;   HERNIA REPAIR  2014   hysterectemy     melonoma removal on R medial arch of foot  2008   TOTAL ABDOMINAL HYSTERECTOMY W/ BILATERAL SALPINGOOPHORECTOMY  2014   TOTAL ABDOMINAL HYSTERECTOMY W/ BILATERAL SALPINGOOPHORECTOMY     UMBILICAL HERNIA REPAIR     occurred with hysterectemy   Patient Active Problem List   Diagnosis Date Noted   Other dysphagia    Schatzki's ring    Screen for colon cancer    Plantar fasciitis 09/06/2019   Spinal stenosis of lumbar region with neurogenic claudication  01/15/2019   Vaginal atrophy 06/24/2017   Multiple allergies 01/13/2017   Major depression, recurrent ([place]) 12/26/2016   Varicose veins of leg with pain, bilateral 12/12/2016   Allergic rhinitis    History of skin cancer     PCP: Park Liter DO  REFERRING PROVIDER: Park Liter DO  REFERRING DIAG: Acute right-sided back pain, unspecified back location  Rationale for Evaluation and Treatment Rehabilitation  THERAPY DIAG:  Other lack of coordination  Acute right-sided back pain, unspecified back location  Other abnormalities of gait and mobility  Sacrococcygeal disorders, not elsewhere classified  ONSET DATE:  over 3 months   SUBJECTIVE:  SUBJECTIVE TODAY   Pt reports LBP radiating pain has been occurring less , only 1-2 x a week now  SUBJECTIVE STATEMENT  EVAL 12/23/21  Over the past 3 months, pt experiences R midback pain and sometimes it goes up her R neck and goes down back of her R leg and it goes numb like pins and needles.  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  6-7/10 on average. At worst 10/10 and she can not sleep. Pt was prescribed steroids medicaiton for the pain but it made her stomach hurt so she stopped taking them. Pt also tried the muscle relaxer but it made breathe fast so she stopped taking them. Relieving factors: pt takes Tylenol and Advil and her husband massages her.     Pain is worst with lifting and pushing at works carts and trays. Pain also hurts around the hips when lying on her side.      One time, pt got a shot for the pain in [place] but it did not help.   Pt was tearful expressing work stress related to bullying and racism from another employee towards her and another  Medical sales representative.  Pt explained this situation has been going on for 2 years. Pt was scheduled to meet with [facility] President last week and has a follow up appt this afternoon. She had already notified her supervisors.   PERTINENT HISTORY:  Hysterectomy, R foot surgery, 5 vaginal deliveries,    PAIN:  Are you having pain? Yes: see above  PRECAUTIONS: None  WEIGHT BEARING RESTRICTIONS No  FALLS:  Has patient fallen in last 6 months? No  LIVING ENVIRONMENT: Lives with: lives with their family Lives in: House/apartment   OCCUPATION:  works in [employer] , food prep   PLOF: Independent  PATIENT GOALS   Feel better   OBJECTIVE:    [facility] PT Assessment - 05/21/22 0909       Observation/Other Assessments   Observations signficantly less forward head posture/ thoracic kyphosis      Coordination   Coordination and Movement Description poor propioception of BLE in backward stepping, front knee above ankle ( anterior WBing),  poor diassociation betwene trunk and BLE in standing             [facility] Adult PT Treatment/Exercise - 05/21/22 0906       Therapeutic Activites    Other Therapeutic Activities downgraded standing to seated multifidis due to difficulty with diassociaiton in standing      Neuro Re-ed    Neuro Re-ed Details  excessive cues for alignment and technique for diassociation of trunk and pelvis/BLE with multifidis and thoracolumbar strenghtening              HOME EXERCISE PROGRAM: See pt instruction section    ASSESSMENT:  CLINICAL IMPRESSION:  Pt shows signficnatly less forward head and thoracic kyphosis posture.  Progressed to multidifis and thoracolumbar strengthening.   Downgraded standing to seated multifidis due to difficulty with diassociaiton in standing downgraded standing to seated multifidis due to difficulty with diassociaiton in standing.  Provided cues  for alignment due to poor propioception of BLE in backward stepping, front knee  above ankle ( anterior WBing),  poor diassociation between trunk and BLE in standing.   Pt demo'd improved technique and awareness of alignment post training.    Pt continues to benefit from skilled PT to  achieve remaining goals.    OBJECTIVE IMPAIRMENTS decreased activity tolerance, decreased coordination, decreased endurance, decreased mobility, difficulty walking, decreased ROM, decreased strength, decreased safety awareness, hypomobility, increased muscle spasms, impaired flexibility, improper body mechanics, postural dysfunction, and pain   ACTIVITY LIMITATIONS  self-care,  home chores, work tasks    PARTICIPATION LIMITATIONS:  community, home   PERSONAL FACTORS   Stress and physical labor , hysterectomy are also affecting patient's functional outcome.    REHAB POTENTIAL: Good   CLINICAL DECISION MAKING: Evolving/moderate complexity   EVALUATION COMPLEXITY: Moderate    PATIENT EDUCATION:    Education details: Showed pt anatomy images. Explained muscles attachments/ connection, physiology of deep core system/ spinal- thoracic-pelvis-lower kinetic chain as they relate to pt's presentation, Sx, and past Hx. Explained what and how these areas of deficits need to be restored to balance and function    See Therapeutic activity / neuromuscular re-education section  Answered pt's questions.   Person educated: Patient Education method: Explanation, Demonstration, Tactile cues, Verbal cues, and Handouts Education comprehension: verbalized understanding, returned demonstration, verbal cues required, tactile cues required, and needs further education     PLAN: PT FREQUENCY: 1x/week   PT DURATION: 10 weeks   PLANNED INTERVENTIONS: Therapeutic exercises, Therapeutic activity, Neuromuscular re-education, Balance training, Gait training, Patient/Family education, Self Care, Joint mobilization, Spinal mobilization, Moist heat, Taping, and Manual therapy.   PLAN FOR NEXT SESSION: See  clinical impression for plan     GOALS: Goals reviewed with patient? Yes  SHORT TERM GOALS: Target date: 01/21/2022    Pt will demo IND with HEP                    Baseline: Not IND            Goal status: MET   LONG TERM GOALS: Target date: 07/09/2022      1.Pt will demo proper deep core coordination without chest breathing and optimal excursion of diaphragm/pelvic floor in order to promote spinal stability and pelvic floor function  Baseline: dyscoordination Goal status: MET  2.  Pt will demo > 5 pt change on FOTO  to improve QOL and function  Lumbar FOTO baseline  Goal status: ongoing   3.  Pt will demo proper body mechanics in against gravity tasks and ADLs  work tasks, fitness  to minimize straining pelvic floor / back                  Baseline: not IND, improper form that places strain on pelvic floor                Goal status:Partially MET    4. Pt will demo no RLE radiating pain with L sidebend and rotation in order to perform work activities with lifting, bending, cooking  Baseline: L sidebend and rotation of the trunk caused R radiating pain down the back of her foot,  (04/30/22: B radiating to back of knee with rotation and sidebend in  both directions)  Goal status: ON going      5. Pt will report no episode of 10/10 pain that prevents her from sleeping across one month in order to indicate improved management of pain and improve sleep quality  Baseline: 10/10 pain that prevents her from sleeping Goal status:  MET without LBP pain but now experiences numbness /. Tingling in BUE which interrupts her sleep ( 04/30/22)   6.  Pt will report decreased  numbness along pinky and ring finger from 7/10  to < 3/10 in order to perform work tasks  Baseline: 7/10  Goal status: NEW       [name], PT 05/21/2022, 9:11 AM

## 2022-05-21 NOTE — Patient Instructions (Signed)
Multifidis twist  Blue  Band is on doorknob: sit facing perpendicular to door , sit halfway towards front of chair, firm through 4 points of contact at buttocks and feet. Feet are placed hip with apart.   Twisting trunk without moving the hips and knees Hold band at the level of ribcage, elbows bent,shoulder blades roll back and down like squeezing a pencil under armpit   Exhale twist,.10-15 deg away from door without moving your hips/ knees, press more weight on the side of the sitting bones/ foot opp of your direction of turn as your counterweight. Continue to maintain equal weight through legs.  Keep knee unlocked.  30 reps   __   SKI against chair   In front of chair, knee against seat to line knee above ankle,  back foot hip width apart, push off through ballmounds,  opp arm up, thumbs up, chin tuck, shoulders down  Push off through ballmounds , step back to under hip width   2 min   x 3

## 2022-06-03 ENCOUNTER — Ambulatory Visit
Admission: RE | Admit: 2022-06-03 | Discharge: 2022-06-03 | Disposition: A | Discharge status: Home / Self Care | Payer: PRIVATE HEALTH INSURANCE | Source: Ambulatory Visit | Attending: Nurse Practitioner | Admitting: Nurse Practitioner

## 2022-06-03 ENCOUNTER — Encounter: Payer: Self-pay | Admitting: Nurse Practitioner

## 2022-06-03 ENCOUNTER — Ambulatory Visit: Payer: Commercial Managed Care - PPO | Admitting: Nurse Practitioner

## 2022-06-03 ENCOUNTER — Ambulatory Visit
Admission: RE | Admit: 2022-06-03 | Discharge: 2022-06-03 | Disposition: A | Discharge status: Home / Self Care | Payer: PRIVATE HEALTH INSURANCE | Source: Ambulatory Visit

## 2022-06-03 VITALS — BP 96/66 | HR 73 | Temp 97.7°F | Ht 64.02 in | Wt 158.2 lb

## 2022-06-03 DIAGNOSIS — M79631 Pain in right forearm: Secondary | ICD-10-CM | POA: Insufficient documentation

## 2022-06-03 NOTE — Assessment & Plan Note (Signed)
Acute for 4 days after injury at workplace.  Will obtain imaging to r/o fracture due to trauma to area.  Mild bruising and tenderness to site. Recommend applying ice at home 4-5 times a day for 20 minutes to area + may alternate Tylenol and Motrin.  Voltaren gel applied to site would offer benefit, discussed with patient.  If fracture will send to ortho.  Suspect more bruising vs fracture.

## 2022-06-03 NOTE — Progress Notes (Signed)
BP 96/66   Pulse 73   Temp 97.7 F (36.5 C) (Oral)   Ht 5' 4.02" (1.626 m)   Wt 158 lb 3.2 oz (71.8 kg)   SpO2 98%   BMI 27.14 kg/m    Subjective:    Patient ID: Karen Mess., female    DOB: Oct 14, 1970, 52 y.o.   MRN: [id number]  HPI: Karen Walker is a 52 y.o. female  Chief Complaint  Patient presents with   Bruise    Bruise on right forearm, happened Saturday at work, states that the entire right arm hurts.   ARM PAIN Has incident at work on Saturday, at [facility].  Was a rack from cooler, there was another one beside it that was overloaded -- when she went to grab her rack the other one fell on her arm.  Bruised forearm right.  She reports pain now to lower arm that radiates up to shoulder.  They did a report at work.   Duration: days Location: right forearm Mechanism of injury: trauma Onset: sudden Severity: 7/10  Quality:  sharp, aching, shooting, and throbbing Frequency: intermittent Radiation: yes up to shoulder Aggravating factors: movement, squeezing, bending with arm  Alleviating factors: resting  Status: fluctuating Treatments attempted: resting, Tylenol and Ibuprofen Relief with NSAIDs?:  No NSAIDs Taken Swelling: yes mild to forearm where bruise is Redness: no  Warmth: no Trauma: yes Chest pain: no  Shortness of breath: no  Fever: no Decreased sensation: no Paresthesias: no Weakness: no   Relevant past medical, surgical, family and social history reviewed and updated as indicated. Interim medical history since our last visit reviewed. Allergies and medications reviewed and updated.  Review of Systems  Constitutional:  Negative for activity change, appetite change, diaphoresis, fatigue and fever.  Respiratory:  Negative for cough, chest tightness and shortness of breath.   Cardiovascular:  Negative for chest pain, palpitations and leg swelling.  Endocrine: Negative for polyuria.  Musculoskeletal:  Positive for arthralgias.  Neurological: Negative.    Psychiatric/Behavioral: Negative.      Per HPI unless specifically indicated above     Objective:    BP 96/66   Pulse 73   Temp 97.7 F (36.5 C) (Oral)   Ht 5' 4.02" (1.626 m)   Wt 158 lb 3.2 oz (71.8 kg)   SpO2 98%   BMI 27.14 kg/m   Wt Readings from Last 3 Encounters:  06/03/22 158 lb 3.2 oz (71.8 kg)  01/23/22 157 lb 12.8 oz (71.6 kg)  01/10/22 158 lb 1.6 oz (71.7 kg)    Physical Exam Vitals and nursing note reviewed.  Constitutional:      General: She is awake. She is not in acute distress.    Appearance: She is well-developed and well-groomed. She is not ill-appearing or toxic-appearing.  HENT:     Head: Normocephalic.     Right Ear: Hearing and external ear normal.     Left Ear: Hearing and external ear normal.  Eyes:     General: Lids are normal.        Right eye: No discharge.        Left eye: No discharge.     Conjunctiva/sclera: Conjunctivae normal.     Pupils: Pupils are equal, round, and reactive to light.  Neck:     Vascular: No carotid bruit.  Cardiovascular:     Rate and Rhythm: Normal rate and regular rhythm.     Heart sounds: Normal heart sounds. No murmur heard.  No gallop.  Pulmonary:     Effort: Pulmonary effort is normal. No accessory muscle usage or respiratory distress.     Breath sounds: Normal breath sounds.  Abdominal:     General: Bowel sounds are normal.     Palpations: Abdomen is soft.  Musculoskeletal:     Right forearm: Swelling (mild to middle anterior aspect), edema (mild to middle anterior aspect) and tenderness present. No lacerations or bony tenderness.     Left forearm: Normal.       Arms:     Cervical back: Normal range of motion and neck supple.     Right lower leg: No edema.     Left lower leg: No edema.     Comments: Tenderness to forearm with flexion and extension of wrist + lateral movements.  Skin:    General: Skin is warm and dry.  Neurological:     Mental Status: She is alert and oriented to person, place,  and time.  Psychiatric:        Attention and Perception: Attention normal.        Mood and Affect: Mood normal.        Speech: Speech normal.        Behavior: Behavior normal. Behavior is cooperative.        Thought Content: Thought content normal.    Results for orders placed or performed in visit on 01/10/22  Urine Culture   Specimen: Urine   UR  Result Value Ref Range   Urine Culture, Routine Final report (A)    Organism ID, Bacteria Escherichia coli (A)    ORGANISM ID, BACTERIA Comment    Antimicrobial Susceptibility Comment   Microscopic Examination   Urine  Result Value Ref Range   WBC, UA 6-10 (A) 0 - 5 /hpf   RBC, Urine 0-2 0 - 2 /hpf   Epithelial Cells (non renal) 0-10 0 - 10 /hpf   Mucus, UA Present (A) Not Estab.   Bacteria, UA Many (A) None seen/Few  CBC with Differential/Platelet  Result Value Ref Range   WBC 4.7 3.4 - 10.8 x10E3/uL   RBC 4.28 3.77 - 5.28 x10E6/uL   Hemoglobin 12.5 11.1 - 15.9 g/dL   Hematocrit 38.0 34.0 - 46.6 %   MCV 89 79 - 97 fL   MCH 29.2 26.6 - 33.0 pg   MCHC 32.9 31.5 - 35.7 g/dL   RDW 12.8 11.7 - 15.4 %   Platelets 200 150 - 450 x10E3/uL   Neutrophils 55 Not Estab. %   Lymphs 38 Not Estab. %   Monocytes 5 Not Estab. %   Eos 2 Not Estab. %   Basos 0 Not Estab. %   Neutrophils Absolute 2.6 1.4 - 7.0 x10E3/uL   Lymphocytes Absolute 1.8 0.7 - 3.1 x10E3/uL   Monocytes Absolute 0.2 0.1 - 0.9 x10E3/uL   EOS (ABSOLUTE) 0.1 0.0 - 0.4 x10E3/uL   Basophils Absolute 0.0 0.0 - 0.2 x10E3/uL   Immature Granulocytes 0 Not Estab. %   Immature Grans (Abs) 0.0 0.0 - 0.1 x10E3/uL  Comprehensive metabolic panel  Result Value Ref Range   Glucose 104 (H) 70 - 99 mg/dL   BUN 14 6 - 24 mg/dL   Creatinine, Ser 0.73 0.57 - 1.00 mg/dL   eGFR 100 >59 mL/min/1.73   BUN/Creatinine Ratio 19 9 - 23   Sodium 143 134 - 144 mmol/L   Potassium 3.6 3.5 - 5.2 mmol/L   Chloride 101 96 - 106 mmol/L   CO2  27 20 - 29 mmol/L   Calcium 9.3 8.7 - 10.2 mg/dL    Total Protein 6.8 6.0 - 8.5 g/dL   Albumin 4.3 3.8 - 4.9 g/dL   Globulin, Total 2.5 1.5 - 4.5 g/dL   Albumin/Globulin Ratio 1.7 1.2 - 2.2   Bilirubin Total 0.4 0.0 - 1.2 mg/dL   Alkaline Phosphatase 60 44 - 121 IU/L   AST 17 0 - 40 IU/L   ALT 16 0 - 32 IU/L  Lipid Panel w/o Chol/HDL Ratio  Result Value Ref Range   Cholesterol, Total 190 100 - 199 mg/dL   Triglycerides 83 0 - 149 mg/dL   HDL 62 >39 mg/dL   VLDL Cholesterol Cal 15 5 - 40 mg/dL   LDL Chol Calc (NIH) 113 (H) 0 - 99 mg/dL  Urinalysis, Routine w reflex microscopic  Result Value Ref Range   Specific Gravity, UA >1.030 (H) 1.005 - 1.030   pH, UA 6.0 5.0 - 7.5   Color, UA Yellow Yellow   Appearance Ur Cloudy (A) Clear   Leukocytes,UA 1+ (A) Negative   Protein,UA Trace (A) Negative/Trace   Glucose, UA Negative Negative   Ketones, UA Negative Negative   RBC, UA 1+ (A) Negative   Bilirubin, UA Negative Negative   Urobilinogen, Ur 0.2 0.2 - 1.0 mg/dL   Nitrite, UA Positive (A) Negative   Microscopic Examination See below:   TSH  Result Value Ref Range   TSH 1.510 0.450 - 4.500 uIU/mL      Assessment & Plan:   Problem List Items Addressed This Visit       Other   Right forearm pain - Primary    Acute for 4 days after injury at workplace.  Will obtain imaging to r/o fracture due to trauma to area.  Mild bruising and tenderness to site. Recommend applying ice at home 4-5 times a day for 20 minutes to area + may alternate Tylenol and Motrin.  Voltaren gel applied to site would offer benefit, discussed with patient.  If fracture will send to ortho.  Suspect more bruising vs fracture.      Relevant Orders   DG Forearm Right     Follow up plan: Return if symptoms worsen or fail to improve.

## 2022-06-03 NOTE — Patient Instructions (Signed)
Voltaren gel over the counter -- rub on area Alternate Ibuprofen and Tylenol  Acute Pain, Adult Acute pain is a type of sudden pain that may last for just a few days or for as long as three months. It is often related to an illness, injury, or a medical procedure. Acute pain may be mild, moderate, or severe. Pain can make it hard for you to do your daily activities. It can cause anxiety and lead to other problems if it is not treated. Treatment may not take all the pain away, but it may lessen the pain so you can move around and tolerate it. Pain is best treated with medicines and other therapies such as distraction, meditation, oils from plants (aromatherapy), heat, and ice. Treatment depends on the cause of the pain and how severe it is. Acute pain usually goes away once your injury has healed or you are no longer ill. Follow these instructions at home: Medicines  Take over-the-counter and prescription medicines only as told by your health care provider. Take the lowest dose of medicine for the shortest amount of time needed to relieve the pain. If you are taking prescription pain medicine: Do not stop taking the medicine suddenly. Talk to your health care provider about how and when to stop taking prescription medicine. Do not take more pills than told by your health care provider even if your pain is severe. Do not take other over-the-counter pain medicines in addition to prescription pain medicine unless told by your health care provider. Keep your medicine in a safe place, away from children or anyone who could use it in a way that it was not prescribed. Ask your health care provider if the medicine prescribed to you requires you to avoid driving or using machinery. Managing pain, stiffness, and swelling     If told, put ice on the affected area. Put ice in a plastic bag. Place a towel between your skin and the bag. Leave the ice on for 20 minutes, 2-3 times a day. If told, apply heat  to the affected area as often as told by your health care provider. Use the heat source that your health care provider recommends, such as a moist heat pack or a heating pad. Place a towel between your skin and the heat source. Leave the heat on for 20-30 minutes. If your skin turns bright red, remove the ice or heat right away to prevent skin damage. The risk of damage is higher if you cannot feel pain, heat, or cold. Managing constipation Your medicines may cause constipation. To prevent or treat constipation, you may need to: Drink enough fluid to keep your urine pale yellow. Take over-the-counter or prescription medicines. Eat foods that are high in fiber, such as beans, whole grains, and fresh fruits and vegetables. Limit foods that are high in fat and processed sugars, such as fried or sweet foods. Activity Rest as told by your health care provider. Return to your normal activities as told by your health care provider. Ask your health care provider what activities are safe for you. Ask your health care provider if doing physical therapy exercises to improve movement and strength can help you manage your pain. General instructions Check your pain level as told by your health care provider. Ask your health care provider if distraction, relaxation, or aromatherapy can help you manage your pain. Keep all follow-up visits. Your health care provider will monitor your pain level. Contact a health care provider if: Your pain is  not controlled by medicine. Your pain does not improve or gets worse. You have side effects from pain medicines. Get help right away if: You have severe pain. You have trouble breathing. You faint, or another person sees you faint. You have chest pain or pressure that lasts for more than a few minutes, or if you have other symptoms along with chest pain, including: Pain or discomfort in one or both arms, your back, neck, jaw, or stomach. Shortness of breath. A cold  sweat. Nausea. Feeling light-headed. These symptoms may be an emergency. Get help right away. Call 911. Do not wait to see if the symptoms will go away. Do not drive yourself to the hospital. This information is not intended to replace advice given to you by your health care provider. Make sure you discuss any questions you have with your health care provider. Document Revised: 10/16/2021 Document Reviewed: 10/16/2021 Elsevier Patient Education  [place].

## 2022-06-06 NOTE — Progress Notes (Signed)
Contacted via MyChart   Good morning, your imaging returned and no fracture noted.  Some swelling present as we know, but everything else looks good.  Continue current treatment regimen.:)

## 2022-06-24 ENCOUNTER — Other Ambulatory Visit: Payer: Self-pay | Admitting: Family Medicine

## 2022-06-24 ENCOUNTER — Other Ambulatory Visit: Payer: Self-pay

## 2022-06-24 MED ORDER — MONTELUKAST SODIUM 10 MG PO TABS
10.0000 mg | ORAL_TABLET | Freq: Every day | ORAL | 3 refills | Status: DC
  Filled 2022-06-24: qty 30, 30d supply, fill #0
  Filled 2022-08-01: qty 30, 30d supply, fill #1
  Filled 2023-01-02: qty 30, 30d supply, fill #2

## 2022-06-24 MED ORDER — CETIRIZINE HCL 10 MG PO TABS
10.0000 mg | ORAL_TABLET | Freq: Every day | ORAL | 11 refills | Status: DC
  Filled 2022-06-24 – 2023-01-02 (×3): qty 30, 30d supply, fill #0

## 2022-06-30 ENCOUNTER — Encounter: Payer: Self-pay | Admitting: Nurse Practitioner

## 2022-06-30 ENCOUNTER — Other Ambulatory Visit: Payer: Self-pay

## 2022-06-30 ENCOUNTER — Ambulatory Visit: Payer: Commercial Managed Care - PPO | Admitting: Nurse Practitioner

## 2022-06-30 VITALS — BP 105/69 | HR 87 | Temp 98.8°F | Wt 154.1 lb

## 2022-06-30 DIAGNOSIS — J0110 Acute frontal sinusitis, unspecified: Secondary | ICD-10-CM | POA: Diagnosis not present

## 2022-06-30 DIAGNOSIS — R0981 Nasal congestion: Secondary | ICD-10-CM | POA: Diagnosis not present

## 2022-06-30 MED ORDER — AMOXICILLIN 500 MG PO CAPS
500.0000 mg | ORAL_CAPSULE | Freq: Two times a day (BID) | ORAL | 0 refills | Status: AC
  Filled 2022-06-30: qty 20, 10d supply, fill #0

## 2022-06-30 NOTE — Progress Notes (Signed)
BP 105/69   Pulse 87   Temp 98.8 F (37.1 C) (Oral)   Wt 154 lb 1.6 oz (69.9 kg)   SpO2 98%   BMI 26.44 kg/m    Subjective:    Patient ID: Karen Mess., female    DOB: Apr 28, 1970, 52 y.o.   MRN: [id number]  HPI: Karen Walker is a 52 y.o. female  Chief Complaint  Patient presents with   URI   UPPER RESPIRATORY TRACT INFECTION Worst symptom: symptoms started on Saturday night Fever: no Cough: yes Shortness of breath: yes Wheezing: yes Chest pain: yes, with cough Chest tightness: no Chest congestion: no Nasal congestion: yes Runny nose: yes Post nasal drip: yes Sneezing: yes Sore throat: yes Swollen glands: no Sinus pressure: yes Headache: yes Face pain: yes Toothache: yes Ear pain: yes bilateral Ear pressure: yes bilateral Eyes red/itching:yes Eye drainage/crusting: yes  Vomiting: no Rash: no Fatigue: yes Sick contacts: yes Strep contacts: no  Context: stable Recurrent sinusitis: no Relief with OTC cold/cough medications: yes  Treatments attempted: cold/sinus   Relevant past medical, surgical, family and social history reviewed and updated as indicated. Interim medical history since our last visit reviewed. Allergies and medications reviewed and updated.  Review of Systems  Constitutional:  Positive for fatigue. Negative for fever.  HENT:  Positive for congestion, dental problem, ear pain, postnasal drip, rhinorrhea, sinus pressure, sinus pain, sneezing and sore throat.   Respiratory:  Positive for cough and shortness of breath. Negative for wheezing.   Cardiovascular:  Negative for chest pain.  Gastrointestinal:  Negative for vomiting.  Skin:  Negative for rash.  Neurological:  Positive for headaches.    Per HPI unless specifically indicated above     Objective:    BP 105/69   Pulse 87   Temp 98.8 F (37.1 C) (Oral)   Wt 154 lb 1.6 oz (69.9 kg)   SpO2 98%   BMI 26.44 kg/m   Wt Readings from Last 3 Encounters:  06/30/22 154 lb 1.6  oz (69.9 kg)  06/03/22 158 lb 3.2 oz (71.8 kg)  01/23/22 157 lb 12.8 oz (71.6 kg)    Physical Exam Vitals and nursing note reviewed.  Constitutional:      General: She is not in acute distress.    Appearance: Normal appearance. She is normal weight. She is not ill-appearing, toxic-appearing or diaphoretic.  HENT:     Head: Normocephalic.     Right Ear: External ear normal. A middle ear effusion is present. Tympanic membrane is erythematous. Tympanic membrane is not bulging.     Left Ear: External ear normal. A middle ear effusion is present. Tympanic membrane is erythematous. Tympanic membrane is not bulging.     Nose:     Right Sinus: Maxillary sinus tenderness and frontal sinus tenderness present.     Left Sinus: Maxillary sinus tenderness and frontal sinus tenderness present.     Mouth/Throat:     Mouth: Mucous membranes are moist.     Pharynx: Oropharynx is clear. Posterior oropharyngeal erythema present. No oropharyngeal exudate.  Eyes:     General:        Right eye: No discharge.        Left eye: No discharge.     Extraocular Movements: Extraocular movements intact.     Conjunctiva/sclera: Conjunctivae normal.     Pupils: Pupils are equal, round, and reactive to light.  Cardiovascular:     Rate and Rhythm: Normal rate and regular rhythm.  Heart sounds: No murmur heard. Pulmonary:     Effort: Pulmonary effort is normal. No respiratory distress.     Breath sounds: Normal breath sounds. No wheezing or rales.  Musculoskeletal:     Cervical back: Normal range of motion and neck supple.  Skin:    General: Skin is warm and dry.     Capillary Refill: Capillary refill takes less than 2 seconds.  Neurological:     General: No focal deficit present.     Mental Status: She is alert and oriented to person, place, and time. Mental status is at baseline.  Psychiatric:        Mood and Affect: Mood normal.        Behavior: Behavior normal.        Thought Content: Thought content  normal.        Judgment: Judgment normal.     Results for orders placed or performed in visit on 01/10/22  Urine Culture   Specimen: Urine   UR  Result Value Ref Range   Urine Culture, Routine Final report (A)    Organism ID, Bacteria Escherichia coli (A)    ORGANISM ID, BACTERIA Comment    Antimicrobial Susceptibility Comment   Microscopic Examination   Urine  Result Value Ref Range   WBC, UA 6-10 (A) 0 - 5 /hpf   RBC, Urine 0-2 0 - 2 /hpf   Epithelial Cells (non renal) 0-10 0 - 10 /hpf   Mucus, UA Present (A) Not Estab.   Bacteria, UA Many (A) None seen/Few  CBC with Differential/Platelet  Result Value Ref Range   WBC 4.7 3.4 - 10.8 x10E3/uL   RBC 4.28 3.77 - 5.28 x10E6/uL   Hemoglobin 12.5 11.1 - 15.9 g/dL   Hematocrit 38.0 34.0 - 46.6 %   MCV 89 79 - 97 fL   MCH 29.2 26.6 - 33.0 pg   MCHC 32.9 31.5 - 35.7 g/dL   RDW 12.8 11.7 - 15.4 %   Platelets 200 150 - 450 x10E3/uL   Neutrophils 55 Not Estab. %   Lymphs 38 Not Estab. %   Monocytes 5 Not Estab. %   Eos 2 Not Estab. %   Basos 0 Not Estab. %   Neutrophils Absolute 2.6 1.4 - 7.0 x10E3/uL   Lymphocytes Absolute 1.8 0.7 - 3.1 x10E3/uL   Monocytes Absolute 0.2 0.1 - 0.9 x10E3/uL   EOS (ABSOLUTE) 0.1 0.0 - 0.4 x10E3/uL   Basophils Absolute 0.0 0.0 - 0.2 x10E3/uL   Immature Granulocytes 0 Not Estab. %   Immature Grans (Abs) 0.0 0.0 - 0.1 x10E3/uL  Comprehensive metabolic panel  Result Value Ref Range   Glucose 104 (H) 70 - 99 mg/dL   BUN 14 6 - 24 mg/dL   Creatinine, Ser 0.73 0.57 - 1.00 mg/dL   eGFR 100 >59 mL/min/1.73   BUN/Creatinine Ratio 19 9 - 23   Sodium 143 134 - 144 mmol/L   Potassium 3.6 3.5 - 5.2 mmol/L   Chloride 101 96 - 106 mmol/L   CO2 27 20 - 29 mmol/L   Calcium 9.3 8.7 - 10.2 mg/dL   Total Protein 6.8 6.0 - 8.5 g/dL   Albumin 4.3 3.8 - 4.9 g/dL   Globulin, Total 2.5 1.5 - 4.5 g/dL   Albumin/Globulin Ratio 1.7 1.2 - 2.2   Bilirubin Total 0.4 0.0 - 1.2 mg/dL   Alkaline Phosphatase 60 44 -  121 IU/L   AST 17 0 - 40 IU/L   ALT 16 0 -  32 IU/L  Lipid Panel w/o Chol/HDL Ratio  Result Value Ref Range   Cholesterol, Total 190 100 - 199 mg/dL   Triglycerides 83 0 - 149 mg/dL   HDL 62 >39 mg/dL   VLDL Cholesterol Cal 15 5 - 40 mg/dL   LDL Chol Calc (NIH) 113 (H) 0 - 99 mg/dL  Urinalysis, Routine w reflex microscopic  Result Value Ref Range   Specific Gravity, UA >1.030 (H) 1.005 - 1.030   pH, UA 6.0 5.0 - 7.5   Color, UA Yellow Yellow   Appearance Ur Cloudy (A) Clear   Leukocytes,UA 1+ (A) Negative   Protein,UA Trace (A) Negative/Trace   Glucose, UA Negative Negative   Ketones, UA Negative Negative   RBC, UA 1+ (A) Negative   Bilirubin, UA Negative Negative   Urobilinogen, Ur 0.2 0.2 - 1.0 mg/dL   Nitrite, UA Positive (A) Negative   Microscopic Examination See below:   TSH  Result Value Ref Range   TSH 1.510 0.450 - 4.500 uIU/mL      Assessment & Plan:   Problem List Items Addressed This Visit   None Visit Diagnoses     Congestion of nasal sinus    -  Primary   Relevant Orders   Novel Coronavirus, NAA ([facility])   Veritor Flu A/B Waived   Rapid Strep screen([facility])        Follow up plan: No follow-ups on file.

## 2022-07-01 LAB — NOVEL CORONAVIRUS, NAA: SARS-CoV-2, NAA: DETECTED — AB

## 2022-07-01 NOTE — Progress Notes (Signed)
Please let patient know that she is COVID positive.  I recommend she stay well hydrated and rest.  No need for medication treatment at this time.

## 2022-07-01 NOTE — Progress Notes (Signed)
Results discussed With patient during visit.

## 2022-07-03 ENCOUNTER — Ambulatory Visit: Payer: Self-pay | Admitting: *Deleted

## 2022-07-03 LAB — VERITOR FLU A/B WAIVED
Influenza A: NEGATIVE
Influenza B: NEGATIVE

## 2022-07-03 LAB — CULTURE, GROUP A STREP: Strep A Culture: NEGATIVE

## 2022-07-03 LAB — RAPID STREP SCREEN (MED CTR MEBANE ONLY): Strep Gp A Ag, IA W/Reflex: NEGATIVE

## 2022-07-03 NOTE — Telephone Encounter (Signed)
Summary: COVID Advice   Pt is calling to report that she tested positve for COVID on 06/30/22 reports congestion, with body aches, headache, runny nose. And is not feeling better. Please advice       Chief Complaint: persistent sx of covid. Taking amoxicillin  Symptoms: shortness of breath with exertion. Dry cough, sneezing runny nose clear drainage, dizziness, feeling weak, headache, no appetite Frequency: since 06/30/22 Pertinent Negatives: Patient denies severe chest pain no difficulty breathing no fever Disposition: [] ED /[] Urgent Care (no appt availability in office) / [] Appointment(In office/virtual)/ []  [facility]/ [] Home Care/ [] Refused Recommended Disposition /[] [facility]/ [x]  Follow-up with PCP Additional Notes:   Last OV 06/30/22. Covid positive and still taking amoxicillin. Sx not getting better. Recommended UC/ED if sx worsen. Please advise if additional medication can be given. Please advise for work note and if appt needed again.     Reason for Disposition  [1] MILD difficulty breathing (e.g., minimal/no SOB at rest, SOB with walking, pulse <100) AND [2] new-onset  Answer Assessment - Initial Assessment Questions 1. COVID-19 DIAGNOSIS: "How do you know that you have COVID?" (e.g., positive lab test or self-test, diagnosed by doctor or NP/PA, symptoms after exposure).     Covid positive 06/30/22 2. COVID-19 EXPOSURE: "Was there any known exposure to COVID before the symptoms began?" CDC Definition of close contact: within 6 feet (2 meters) for a total of 15 minutes or more over a 24-hour period.      Positive covid last seen 06/30/22 3. ONSET: "When did the COVID-19 symptoms start?"      06/29/22 4. WORST SYMPTOM: "What is your worst symptom?" (e.g., cough, fever, shortness of breath, muscle aches)     Shortness of breath with exertion dry cough, sneezing, no appetite  weakness , dizziness headache  5. COUGH: "Do you have a cough?" If Yes, ask: "How  bad is the cough?"       Dry cough  6. FEVER: "Do you have a fever?" If Yes, ask: "What is your temperature, how was it measured, and when did it start?"     No  7. RESPIRATORY STATUS: "Describe your breathing?" (e.g., normal; shortness of breath, wheezing, unable to speak)      Shortness of breath with exertion 8. BETTER-SAME-WORSE: "Are you getting better, staying the same or getting worse compared to yesterday?"  If getting worse, ask, "In what way?"     Staying the same  9. OTHER SYMPTOMS: "Do you have any other symptoms?"  (e.g., chills, fatigue, headache, loss of smell or taste, muscle pain, sore throat)     Headache sneezing  10. HIGH RISK DISEASE: "Do you have any chronic medical problems?" (e.g., asthma, heart or lung disease, weak immune system, obesity, etc.)       na 11. VACCINE: "Have you had the COVID-19 vaccine?" If Yes, ask: "Which one, how many shots, when did you get it?"       na 12. PREGNANCY: "Is there any chance you are pregnant?" "When was your last menstrual period?"       na 13. O2 SATURATION MONITOR:  "Do you use an oxygen saturation monitor (pulse oximeter) at home?" If Yes, ask "What is your reading (oxygen level) today?" "What is your usual oxygen saturation reading?" (e.g., 95%)       na  Answer Assessment - Initial Assessment Questions 1. COVID-19 ONSET: "When did the symptoms of COVID-19 first start?"     06/29/22 2. DIAGNOSIS CONFIRMATION: "How  were you diagnosed?" (e.g., COVID-19 oral or nasal viral test; COVID-19 antibody test; doctor visit)     Dr visit 06/30/22 3. MAIN SYMPTOM:  "What is your main concern or symptom right now?" (e.g., breathing difficulty, cough, fatigue. loss of smell)     Shortness of breath with exertion. Dry cough, sneezing runny nose.  4. SYMPTOM ONSET: "When did the  na  start?"     na 5. BETTER-SAME-WORSE: "Are you getting better, staying the same, or getting worse over the last 1 to 2 weeks?"     Same or worse 6. RECENT MEDICAL  VISIT: "Have you been seen by a healthcare provider (doctor, NP, PA) for these persisting COVID-19 symptoms?" If Yes, ask: "When were you seen?" (e.g., date)     Yes 06/30/22 7. COUGH: "Do you have a cough?" If Yes, ask: "How bad is the cough?"       Dry cough 8. FEVER: "Do you have a fever?" If Yes, ask: "What is your temperature, how was it measured, and when did it start?"     na 9. BREATHING DIFFICULTY: "Are you having any trouble breathing?" If Yes, ask: "How bad is your breathing?" (e.g., mild, moderate, severe)    - MILD: No SOB at rest, mild SOB with walking, speaks normally in sentences, can lie down, no retractions, pulse < 100.    - MODERATE: SOB at rest, SOB with minimal exertion and prefers to sit, cannot lie down flat, speaks in phrases, mild retractions, audible wheezing, pulse 100-120.    - SEVERE: Very SOB at rest, speaks in single words, struggling to breathe, sitting hunched forward, retractions, pulse > 120.       Short of breath with exertion 10. OTHER SYMPTOMS: "Do you have any other symptoms?"  (e.g., fatigue, headache, muscle pain, weakness)       Headache, dizziness, weakness, no appetite, sneezing runny nose  11. HIGH RISK DISEASE: "Do you have any chronic medical problems?" (e.g., asthma, heart or lung disease, weak immune system, obesity, etc.)       Na  12. VACCINE: "Have you gotten the COVID-19 vaccine?" If Yes, ask: "Which one, how many shots, when did you get it?"       na 13. PREGNANCY: "Is there any chance you are pregnant?" "When was your last menstrual period?"       na 14. O2 SATURATION MONITOR:  "Do you use an oxygen saturation monitor (pulse oximeter) at home?" If Yes, ask "What is your reading (oxygen level) today?" "What is your usual oxygen saturation reading?" (e.g., 95%)       na  Protocols used: Coronavirus (COVID-19) Diagnosed or Suspected-[facility], Coronavirus (COVID-19) Persisting Symptoms Follow-up Call-[facility]

## 2022-07-03 NOTE — Telephone Encounter (Signed)
Attempted to reach pt, left VM to call back

## 2022-07-04 NOTE — Telephone Encounter (Signed)
Patient has a virus that has to run its course.  The antibiotic is not helping because she is has COVID.  I recommend over the counter symptom management with mucinex to help with symptoms.

## 2022-07-04 NOTE — Telephone Encounter (Signed)
Patient notified of Karen's message.  

## 2022-07-25 ENCOUNTER — Telehealth: Payer: Self-pay | Admitting: Family Medicine

## 2022-07-25 NOTE — Telephone Encounter (Unsigned)
Copied from CRM [id number]. Topic: General - Inquiry >> Jul 25, 2022  1:32 PM Yolanda T wrote: Reason for CRM: patient is requesting provider schedule her for a mammogram as she has felt a lump on the outer side of her right breast closer to her armpit. Please advise patient

## 2022-07-28 NOTE — Telephone Encounter (Signed)
Patient was notified of Dr Henriette Combs recommendations via MyChart. Advised patient to give our office a call back or send a message if she has any questions.

## 2022-07-28 NOTE — Telephone Encounter (Signed)
No appointment, but please fins out what o'clock the lump is so I can order the correct mammo

## 2022-08-01 ENCOUNTER — Other Ambulatory Visit: Payer: Self-pay

## 2022-08-04 ENCOUNTER — Other Ambulatory Visit: Payer: Self-pay

## 2022-08-04 ENCOUNTER — Encounter: Payer: Self-pay | Admitting: Family Medicine

## 2022-08-04 ENCOUNTER — Ambulatory Visit: Payer: Commercial Managed Care - PPO | Admitting: Family Medicine

## 2022-08-04 VITALS — BP 91/62 | HR 80 | Temp 97.7°F | Wt 159.8 lb

## 2022-08-04 DIAGNOSIS — U099 Post covid-19 condition, unspecified: Secondary | ICD-10-CM

## 2022-08-04 DIAGNOSIS — R519 Headache, unspecified: Secondary | ICD-10-CM | POA: Diagnosis not present

## 2022-08-04 DIAGNOSIS — N6313 Unspecified lump in the right breast, lower outer quadrant: Secondary | ICD-10-CM | POA: Diagnosis not present

## 2022-08-04 DIAGNOSIS — Z1231 Encounter for screening mammogram for malignant neoplasm of breast: Secondary | ICD-10-CM

## 2022-08-04 MED ORDER — NORTRIPTYLINE HCL 25 MG PO CAPS
25.0000 mg | ORAL_CAPSULE | Freq: Every day | ORAL | 1 refills | Status: DC
  Filled 2022-08-04: qty 30, 30d supply, fill #0

## 2022-08-04 MED ORDER — TRIAMCINOLONE ACETONIDE 40 MG/ML IJ SUSP
40.0000 mg | Freq: Once | INTRAMUSCULAR | Status: AC
  Administered 2022-08-04: 40 mg via INTRAMUSCULAR

## 2022-08-04 MED ORDER — PREDNISONE 10 MG PO TABS
ORAL_TABLET | ORAL | 1 refills | Status: AC
  Filled 2022-08-04: qty 42, 12d supply, fill #0

## 2022-08-04 NOTE — Progress Notes (Signed)
BP 91/62   Pulse 80   Temp 97.7 F (36.5 C) (Oral)   Wt 159 lb 12.8 oz (72.5 kg)   SpO2 96%   BMI 27.42 kg/m    Subjective:    Patient ID: Karen Walker., female    DOB: 10-02-1970, 52 y.o.   MRN: [id number]  HPI: Karen Walker is a 52 y.o. female  Chief Complaint  Patient presents with   Lingering COVID     Patient says she is having some issues with headaches, nasal congestion, tension in her shoulders and neck and watery eyes. Patient says she has been trying Zyrtec and Singulair at night. Patient says she does not feel like it is helping as much.    BREAST PAIN Duration :couple of weeks Location: R breast about 8-9:00 Onset: sudden Severity: moderate Quality: aching and sore Frequency: intermittent Redness: no Swelling: no Trauma: no trauma Breastfeeding: no Associated with menstral cycle: no Nipple discharge: no Breast lump: yes Status: stable Treatments attempted: none Previous mammogram: yes  UPPER RESPIRATORY TRACT INFECTION- had COVID at the end of March, feels like she never got better Worst symptom: headache Fever: no Cough: yes Shortness of breath: yes Wheezing: no Chest pain: yes Chest tightness: yes Chest congestion: no Nasal congestion: yes Runny nose: yes Post nasal drip: yes Sneezing: yes Sore throat: no Swollen glands: no Sinus pressure: yes Headache: yes Face pain: yes Toothache: no Ear pain: no  Ear pressure: yes bilateral Eyes red/itching:yes Eye drainage/crusting: yes  Vomiting: no Rash: no Fatigue: yes Sick contacts: no Strep contacts: no  Context: stable Recurrent sinusitis: no Relief with OTC cold/cough medications: no  Treatments attempted: zyrtec, singulair    Relevant past medical, surgical, family and social history reviewed and updated as indicated. Interim medical history since our last visit reviewed. Allergies and medications reviewed and updated.  Review of Systems  Constitutional: Negative.   HENT:   Positive for congestion, postnasal drip, rhinorrhea, sinus pressure and sneezing. Negative for dental problem, drooling, ear discharge, ear pain, facial swelling, hearing loss, mouth sores, nosebleeds, sinus pain, sore throat, tinnitus, trouble swallowing and voice change.   Respiratory:  Positive for cough, chest tightness, shortness of breath and wheezing. Negative for choking and stridor.   Cardiovascular: Negative.   Gastrointestinal: Negative.   Musculoskeletal: Negative.   Psychiatric/Behavioral: Negative.      Per HPI unless specifically indicated above     Objective:    BP 91/62   Pulse 80   Temp 97.7 F (36.5 C) (Oral)   Wt 159 lb 12.8 oz (72.5 kg)   SpO2 96%   BMI 27.42 kg/m   Wt Readings from Last 3 Encounters:  08/04/22 159 lb 12.8 oz (72.5 kg)  06/30/22 154 lb 1.6 oz (69.9 kg)  06/03/22 158 lb 3.2 oz (71.8 kg)    Physical Exam Vitals and nursing note reviewed.  Constitutional:      General: She is not in acute distress.    Appearance: Normal appearance. She is normal weight. She is not ill-appearing, toxic-appearing or diaphoretic.  HENT:     Head: Normocephalic and atraumatic.     Right Ear: Tympanic membrane, ear canal and external ear normal.     Left Ear: Tympanic membrane, ear canal and external ear normal.     Nose: Rhinorrhea present. No congestion.     Mouth/Throat:     Mouth: Mucous membranes are moist.     Pharynx: Oropharynx is clear. No oropharyngeal exudate or posterior oropharyngeal  erythema.  Eyes:     General: No scleral icterus.       Right eye: No discharge.        Left eye: No discharge.     Extraocular Movements: Extraocular movements intact.     Conjunctiva/sclera: Conjunctivae normal.     Pupils: Pupils are equal, round, and reactive to light.  Cardiovascular:     Rate and Rhythm: Normal rate and regular rhythm.     Pulses: Normal pulses.     Heart sounds: Normal heart sounds. No murmur heard.    No friction rub. No gallop.   Pulmonary:     Effort: Pulmonary effort is normal. No respiratory distress.     Breath sounds: Normal breath sounds. No stridor. No wheezing, rhonchi or rales.  Chest:     Chest wall: No tenderness.  Musculoskeletal:        General: Normal range of motion.     Cervical back: Normal range of motion and neck supple.  Skin:    General: Skin is warm and dry.     Capillary Refill: Capillary refill takes less than 2 seconds.     Coloration: Skin is not jaundiced or pale.     Findings: No bruising, erythema, lesion or rash.  Neurological:     General: No focal deficit present.     Mental Status: She is alert and oriented to person, place, and time. Mental status is at baseline.  Psychiatric:        Mood and Affect: Mood normal.        Behavior: Behavior normal.        Thought Content: Thought content normal.        Judgment: Judgment normal.     Results for orders placed or performed in visit on 06/30/22  Novel Coronavirus, NAA ([facility])   Specimen: Nasopharyngeal(NP) swabs in vial transport medium  Result Value Ref Range   SARS-CoV-2, NAA Detected (A) Not Detected  Rapid Strep screen([facility])   Specimen: Other   Other  Result Value Ref Range   Strep Gp A Ag, IA W/Reflex Negative Negative  Culture, Group A Strep   Other  Result Value Ref Range   Strep A Culture Negative   Veritor Flu A/B Waived  Result Value Ref Range   Influenza A Negative Negative   Influenza B Negative Negative      Assessment & Plan:   Problem List Items Addressed This Visit   None Visit Diagnoses     Acute nonintractable headache, unspecified headache type    -  Primary   ?Post-covid. Will treat with steroid taper and nortritpyline. Recheck 1 month. Call with any concerns.   Relevant Medications   nortriptyline (PAMELOR) 25 MG capsule   triamcinolone acetonide (KENALOG-40) injection 40 mg   Encounter for screening mammogram for malignant neoplasm of breast       Mammogram and [place]  ordered today.   Relevant Orders   MM 3D DIAGNOSTIC MAMMOGRAM UNILATERAL RIGHT BREAST   US LIMITED ULTRASOUND INCLUDING AXILLA RIGHT BREAST   MM 3D DIAGNOSTIC MAMMOGRAM BILATERAL BREAST   Mass of lower outer quadrant of right breast       Mammogram and [place] ordered today.   Relevant Orders   MM 3D DIAGNOSTIC MAMMOGRAM UNILATERAL RIGHT BREAST   US LIMITED ULTRASOUND INCLUDING AXILLA RIGHT BREAST   MM 3D DIAGNOSTIC MAMMOGRAM BILATERAL BREAST   Post-COVID syndrome       Likely complicated by seasonal allergies. Will treat with nortriptyline and  steroid taper. Recheck 1 month. Call with any concerns.        Follow up plan: Return in about 4 weeks (around 09/01/2022).

## 2022-08-04 NOTE — Patient Instructions (Addendum)
Patient is scheduled for Monday May 6th at 10:00 AM at [facility] at [place] location. If patient has any questions regarding her scheduled appointment. Patient will need to reach out to their office directly at [phone] to discuss.

## 2022-08-07 ENCOUNTER — Ambulatory Visit: Payer: Commercial Managed Care - PPO | Admitting: Dermatology

## 2022-08-07 VITALS — BP 105/68 | HR 84

## 2022-08-07 DIAGNOSIS — D229 Melanocytic nevi, unspecified: Secondary | ICD-10-CM

## 2022-08-07 DIAGNOSIS — L814 Other melanin hyperpigmentation: Secondary | ICD-10-CM | POA: Diagnosis not present

## 2022-08-07 DIAGNOSIS — D489 Neoplasm of uncertain behavior, unspecified: Secondary | ICD-10-CM

## 2022-08-07 DIAGNOSIS — I8393 Asymptomatic varicose veins of bilateral lower extremities: Secondary | ICD-10-CM

## 2022-08-07 DIAGNOSIS — Z86018 Personal history of other benign neoplasm: Secondary | ICD-10-CM | POA: Diagnosis not present

## 2022-08-07 DIAGNOSIS — L821 Other seborrheic keratosis: Secondary | ICD-10-CM | POA: Diagnosis not present

## 2022-08-07 DIAGNOSIS — Z1283 Encounter for screening for malignant neoplasm of skin: Secondary | ICD-10-CM | POA: Diagnosis not present

## 2022-08-07 DIAGNOSIS — L578 Other skin changes due to chronic exposure to nonionizing radiation: Secondary | ICD-10-CM

## 2022-08-07 DIAGNOSIS — X32XXXA Exposure to sunlight, initial encounter: Secondary | ICD-10-CM

## 2022-08-07 DIAGNOSIS — D1801 Hemangioma of skin and subcutaneous tissue: Secondary | ICD-10-CM

## 2022-08-07 DIAGNOSIS — D2272 Melanocytic nevi of left lower limb, including hip: Secondary | ICD-10-CM

## 2022-08-07 DIAGNOSIS — W908XXA Exposure to other nonionizing radiation, initial encounter: Secondary | ICD-10-CM

## 2022-08-07 NOTE — Progress Notes (Signed)
Follow-Up Visit   Subjective  Karen Walker. is a 52 y.o. female who presents for the following: Skin Cancer Screening and Full Body Skin Exam Hx of dysplastic nevi,  Bump at left posterior leg    The patient presents for Total-Body Skin Exam (TBSE) for skin cancer screening and mole check. The patient has spots, moles and lesions to be evaluated, some may be new or changing and the patient has concerns that these could be cancer.    The following portions of the chart were reviewed this encounter and updated as appropriate: medications, allergies, medical history  Review of Systems:  No other skin or systemic complaints except as noted in HPI or Assessment and Plan.  Objective  Well appearing patient in no apparent distress; mood and affect are within normal limits.  A full examination was performed including scalp, head, eyes, ears, nose, lips, neck, chest, axillae, abdomen, back, buttocks, bilateral upper extremities, bilateral lower extremities, hands, feet, fingers, toes, fingernails, and toenails. All findings within normal limits unless otherwise noted below.   Relevant physical exam findings are noted in the Assessment and Plan.  left middle medial calf 0.6 cm irregular brown macule          Assessment & Plan   LENTIGINES, SEBORRHEIC KERATOSES, HEMANGIOMAS - Benign normal skin lesions - Benign-appearing - Call for any changes  MELANOCYTIC NEVI - Tan-brown and/or pink-flesh-colored symmetric macules and papules - Benign appearing on exam today - Observation - Call clinic for new or changing moles - Recommend daily use of broad spectrum spf 30+ sunscreen to sun-exposed areas.   Varicose Veins/Spider Veins - Dilated blue, purple or red veins at the lower extremities - Reassured - Smaller vessels can be treated by sclerotherapy (a procedure to inject a medicine into the veins to make them disappear) if desired, but the treatment is not covered by insurance.  Larger vessels may be covered if symptomatic and we would refer to vascular surgeon if treatment desired.   ACTINIC DAMAGE - Chronic condition, secondary to cumulative UV/sun exposure - diffuse scaly erythematous macules with underlying dyspigmentation - Recommend daily broad spectrum sunscreen SPF 30+ to sun-exposed areas, reapply every 2 hours as needed.  - Staying in the shade or wearing long sleeves, sun glasses (UVA+UVB protection) and wide brim hats (4-inch brim around the entire circumference of the hat) are also recommended for sun protection.  - Call for new or changing lesions.  HISTORY OF DYSPLASTIC NEVUS 2021 Close to margin right foot inferior to medial maleolus  No evidence of recurrence today Recommend regular full body skin exams Recommend daily broad spectrum sunscreen SPF 30+ to sun-exposed areas, reapply every 2 hours as needed.  Call if any new or changing lesions are noted between office visits  SKIN CANCER SCREENING PERFORMED TODAY.    Neoplasm of uncertain behavior left middle medial calf  Epidermal / dermal shaving  Lesion diameter (cm):  0.6 Informed consent: discussed and consent obtained   Timeout: patient name, date of birth, surgical site, and procedure verified   Procedure prep:  Patient was prepped and draped in usual sterile fashion Prep type:  Isopropyl alcohol Anesthesia: the lesion was anesthetized in a standard fashion   Anesthetic:  1% lidocaine w/ epinephrine 1-100,000 buffered w/ 8.4% NaHCO3 Instrument used: flexible razor blade   Hemostasis achieved with: pressure, aluminum chloride and electrodesiccation   Outcome: patient tolerated procedure well   Post-procedure details: sterile dressing applied and wound care instructions given   Dressing  type: bandage and petrolatum    Specimen 1 - Surgical pathology Differential Diagnosis: Irregular nevus r/o dysplasia  Check Margins: No  Irregular nevus r/o dysplasia   Return in about 1  year (around 08/07/2023) for TBSE.  IAsher Muir, CMA, am acting as scribe for Armida Sans, MD.   Documentation: I have reviewed the above documentation for accuracy and completeness, and I agree with the above.  Armida Sans, MD

## 2022-08-07 NOTE — Patient Instructions (Addendum)
Biopsy Wound Care Instructions  Leave the original bandage on for 24 hours if possible.  If the bandage becomes soaked or soiled before that time, it is OK to remove it and examine the wound.  A small amount of post-operative bleeding is normal.  If excessive bleeding occurs, remove the bandage, place gauze over the site and apply continuous pressure (no peeking) over the area for 30 minutes. If this does not work, please call our clinic as soon as possible or page your doctor if it is after hours.   Once a day, cleanse the wound with soap and water. It is fine to shower. If a thick crust develops you may use a Q-tip dipped into dilute hydrogen peroxide (mix 1:1 with water) to dissolve it.  Hydrogen peroxide can slow the healing process, so use it only as needed.    After washing, apply petroleum jelly (Vaseline) or an antibiotic ointment if your doctor prescribed one for you, followed by a bandage.    For best healing, the wound should be covered with a layer of ointment at all times. If you are not able to keep the area covered with a bandage to hold the ointment in place, this may mean re-applying the ointment several times a day.  Continue this wound care until the wound has healed and is no longer open.   Itching and mild discomfort is normal during the healing process. However, if you develop pain or severe itching, please call our office.   If you have any discomfort, you can take Tylenol (acetaminophen) or ibuprofen as directed on the bottle. (Please do not take these if you have an allergy to them or cannot take them for another reason).  Some redness, tenderness and white or yellow material in the wound is normal healing.  If the area becomes very sore and red, or develops a thick yellow-green material (pus), it may be infected; please notify us.    If you have stitches, return to clinic as directed to have the stitches removed. You will continue wound care for 2-3 days after the stitches  are removed.   Wound healing continues for up to one year following surgery. It is not unusual to experience pain in the scar from time to time during the interval.  If the pain becomes severe or the scar thickens, you should notify the office.    A slight amount of redness in a scar is expected for the first six months.  After six months, the redness will fade and the scar will soften and fade.  The color difference becomes less noticeable with time.  If there are any problems, return for a post-op surgery check at your earliest convenience.  To improve the appearance of the scar, you can use silicone scar gel, cream, or sheets (such as Mederma or Serica) every night for up to one year. These are available over the counter (without a prescription).  Please call our office at [phone] for any questions or concerns.       Melanoma ABCDEs  Melanoma is the most dangerous type of skin cancer, and is the leading cause of death from skin disease.  You are more likely to develop melanoma if you: Have light-colored skin, light-colored eyes, or red or blond hair Spend a lot of time in the sun Tan regularly, either outdoors or in a tanning bed Have had blistering sunburns, especially during childhood Have a close family member who has had a melanoma Have atypical moles   or large birthmarks  Early detection of melanoma is key since treatment is typically straightforward and cure rates are extremely high if we catch it early.   The first sign of melanoma is often a change in a mole or a new dark spot.  The ABCDE system is a way of remembering the signs of melanoma.  A for asymmetry:  The two halves do not match. B for border:  The edges of the growth are irregular. C for color:  A mixture of colors are present instead of an even brown color. D for diameter:  Melanomas are usually (but not always) greater than 6mm - the size of a pencil eraser. E for evolution:  The spot keeps changing in  size, shape, and color.  Please check your skin once per month between visits. You can use a small mirror in front and a large mirror behind you to keep an eye on the back side or your body.   If you see any new or changing lesions before your next follow-up, please call to schedule a visit.  Please continue daily skin protection including broad spectrum sunscreen SPF 30+ to sun-exposed areas, reapplying every 2 hours as needed when you're outdoors.   Staying in the shade or wearing long sleeves, sun glasses (UVA+UVB protection) and wide brim hats (4-inch brim around the entire circumference of the hat) are also recommended for sun protection.      Due to recent changes in healthcare laws, you may see results of your pathology and/or laboratory studies on MyChart before the doctors have had a chance to review them. We understand that in some cases there may be results that are confusing or concerning to you. Please understand that not all results are received at the same time and often the doctors may need to interpret multiple results in order to provide you with the best plan of care or course of treatment. Therefore, we ask that you please give us 2 business days to thoroughly review all your results before contacting the office for clarification. Should we see a critical lab result, you will be contacted sooner.   If You Need Anything After Your Visit  If you have any questions or concerns for your doctor, please call our main line at [phone] and press option 4 to reach your doctor's medical assistant. If no one answers, please leave a voicemail as directed and we will return your call as soon as possible. Messages left after 4 pm will be answered the following business day.   You may also send us a message via MyChart. We typically respond to MyChart messages within 1-2 business days.  For prescription refills, please ask your pharmacy to contact our office. Our fax number is  [phone].  If you have an urgent issue when the clinic is closed that cannot wait until the next business day, you can page your doctor at the number below.    Please note that while we do our best to be available for urgent issues outside of office hours, we are not available 24/7.   If you have an urgent issue and are unable to reach us, you may choose to seek medical care at your doctor's office, retail clinic, urgent care center, or emergency room.  If you have a medical emergency, please immediately call 911 or go to the emergency department.  Pager Numbers  - Dr. Kowalski: [phone]  - Dr. Moye: [phone]  - Dr. Stewart: [phone]  In the event   of inclement weather, please call our main line at [phone] for an update on the status of any delays or closures.  Dermatology Medication Tips: Please keep the boxes that topical medications come in in order to help keep track of the instructions about where and how to use these. Pharmacies typically print the medication instructions only on the boxes and not directly on the medication tubes.   If your medication is too expensive, please contact our office at [phone] option 4 or send us a message through MyChart.   We are unable to tell what your co-pay for medications will be in advance as this is different depending on your insurance coverage. However, we may be able to find a substitute medication at lower cost or fill out paperwork to get insurance to cover a needed medication.   If a prior authorization is required to get your medication covered by your insurance company, please allow us 1-2 business days to complete this process.  Drug prices often vary depending on where the prescription is filled and some pharmacies may offer cheaper prices.  The website www.goodrx.com contains coupons for medications through different pharmacies. The prices here do not account for what the cost may be with help from  insurance (it may be cheaper with your insurance), but the website can give you the price if you did not use any insurance.  - You can print the associated coupon and take it with your prescription to the pharmacy.  - You may also stop by our office during regular business hours and pick up a GoodRx coupon card.  - If you need your prescription sent electronically to a different pharmacy, notify our office through [facility] MyChart or by phone at [phone] option 4.     Si Usted Necesita Algo Despus de Su Visita  Tambin puede enviarnos un mensaje a travs de MyChart. Por lo general respondemos a los mensajes de MyChart en el transcurso de 1 a 2 das hbiles.  Para renovar recetas, por favor pida a su farmacia que se ponga en contacto con nuestra oficina. Nuestro nmero de fax es el [phone].  Si tiene un asunto urgente cuando la clnica est cerrada y que no puede esperar hasta el siguiente da hbil, puede llamar/localizar a su doctor(a) al nmero que aparece a continuacin.   Por favor, tenga en cuenta que aunque hacemos todo lo posible para estar disponibles para asuntos urgentes fuera del horario de oficina, no estamos disponibles las 24 horas del da, los 7 das de la semana.   Si tiene un problema urgente y no puede comunicarse con nosotros, puede optar por buscar atencin mdica  en el consultorio de su doctor(a), en una clnica privada, en un centro de atencin urgente o en una sala de emergencias.  Si tiene una emergencia mdica, por favor llame inmediatamente al 911 o vaya a la sala de emergencias.  Nmeros de bper  - Dr. Kowalski: [phone]  - Dra. Moye: [phone]  - Dra. Stewart: [phone]  En caso de inclemencias del tiempo, por favor llame a nuestra lnea principal al [phone] para una actualizacin sobre el estado de cualquier retraso o cierre.  Consejos para la medicacin en dermatologa: Por favor, guarde las cajas en las que vienen los  medicamentos de uso tpico para ayudarle a seguir las instrucciones sobre dnde y cmo usarlos. Las farmacias generalmente imprimen las instrucciones del medicamento slo en las cajas y no directamente en los tubos del medicamento.   Si su medicamento   es muy caro, por favor, pngase en contacto con nuestra oficina llamando al [phone] y presione la opcin 4 o envenos un mensaje a travs de MyChart.   No podemos decirle cul ser su copago por los medicamentos por adelantado ya que esto es diferente dependiendo de la cobertura de su seguro. Sin embargo, es posible que podamos encontrar un medicamento sustituto a menor costo o llenar un formulario para que el seguro cubra el medicamento que se considera necesario.   Si se requiere una autorizacin previa para que su compaa de seguros cubra su medicamento, por favor permtanos de 1 a 2 das hbiles para completar este proceso.  Los precios de los medicamentos varan con frecuencia dependiendo del lugar de dnde se surte la receta y alguna farmacias pueden ofrecer precios ms baratos.  El sitio web www.goodrx.com tiene cupones para medicamentos de diferentes farmacias. Los precios aqu no tienen en cuenta lo que podra costar con la ayuda del seguro (puede ser ms barato con su seguro), pero el sitio web puede darle el precio si no utiliz ningn seguro.  - Puede imprimir el cupn correspondiente y llevarlo con su receta a la farmacia.  - Tambin puede pasar por nuestra oficina durante el horario de atencin regular y recoger una tarjeta de cupones de GoodRx.  - Si necesita que su receta se enve electrnicamente a una farmacia diferente, informe a nuestra oficina a travs de MyChart de [place] o por telfono llamando al [phone] y presione la opcin 4.  

## 2022-08-11 ENCOUNTER — Ambulatory Visit
Admission: RE | Admit: 2022-08-11 | Discharge: 2022-08-11 | Disposition: A | Discharge status: Home / Self Care | Payer: Commercial Managed Care - PPO | Source: Ambulatory Visit | Attending: Family Medicine | Admitting: Family Medicine

## 2022-08-11 DIAGNOSIS — N6313 Unspecified lump in the right breast, lower outer quadrant: Secondary | ICD-10-CM

## 2022-08-11 DIAGNOSIS — R92321 Mammographic fibroglandular density, right breast: Secondary | ICD-10-CM | POA: Diagnosis not present

## 2022-08-11 DIAGNOSIS — Z1231 Encounter for screening mammogram for malignant neoplasm of breast: Secondary | ICD-10-CM | POA: Insufficient documentation

## 2022-08-11 DIAGNOSIS — N644 Mastodynia: Secondary | ICD-10-CM | POA: Diagnosis not present

## 2022-08-14 ENCOUNTER — Encounter: Payer: Self-pay | Admitting: Dermatology

## 2022-08-15 ENCOUNTER — Encounter: Payer: Self-pay | Admitting: Family Medicine

## 2022-08-15 ENCOUNTER — Ambulatory Visit: Payer: Commercial Managed Care - PPO | Admitting: Family Medicine

## 2022-08-15 VITALS — BP 94/61 | HR 92 | Temp 97.8°F | Wt 158.3 lb

## 2022-08-15 DIAGNOSIS — R42 Dizziness and giddiness: Secondary | ICD-10-CM | POA: Diagnosis not present

## 2022-08-15 DIAGNOSIS — R519 Headache, unspecified: Secondary | ICD-10-CM | POA: Diagnosis not present

## 2022-08-15 NOTE — Progress Notes (Signed)
BP 94/61   Pulse 92   Temp 97.8 F (36.6 C) (Oral)   Wt 158 lb 4.8 oz (71.8 kg)   SpO2 96%   BMI 27.16 kg/m    Subjective:    Patient ID: Karen Rolling., female    DOB: 1970-07-10, 52 y.o.   MRN: [id number]  HPI: Karen Walker is a 52 y.o. female  Chief Complaint  Patient presents with   Dizziness    Patient says she is almost finished with her steroid prescription and says when she takes the prescription, she notices some dizziness.    DIZZINESS Duration: since starting prednisone and only with taking prednisone Description of symptoms: lightheadedness Duration of episode: minutes Dizziness frequency: with taking medicine Provoking factors: none Aggravating factors:  taking prednisone Triggered by rolling over in bed: no Triggered by bending over: no Aggravated by head movement: no Aggravated by exertion, coughing, loud noises: no Recent head injury: no Recent or current viral symptoms: no History of vasovagal episodes: no Nausea: yes Vomiting: no Tinnitus: no Hearing loss: no Aural fullness: yes Headache: no- have resolved with her medicine.  Photophobia/phonophobia: no Unsteady gait: no Postural instability: no Diplopia, dysarthria, dysphagia or weakness: no Related to exertion: no Pallor: no Diaphoresis: no Dyspnea: no Chest pain: no  Relevant past medical, surgical, family and social history reviewed and updated as indicated. Interim medical history since our last visit reviewed. Allergies and medications reviewed and updated.  Review of Systems  Constitutional: Negative.   Respiratory: Negative.    Cardiovascular: Negative.   Gastrointestinal: Negative.   Musculoskeletal: Negative.   Neurological:  Positive for dizziness. Negative for tremors, seizures, syncope, facial asymmetry, speech difficulty, weakness, light-headedness, numbness and headaches.  Psychiatric/Behavioral: Negative.      Per HPI unless specifically indicated above      Objective:    BP 94/61   Pulse 92   Temp 97.8 F (36.6 C) (Oral)   Wt 158 lb 4.8 oz (71.8 kg)   SpO2 96%   BMI 27.16 kg/m   Wt Readings from Last 3 Encounters:  08/15/22 158 lb 4.8 oz (71.8 kg)  08/04/22 159 lb 12.8 oz (72.5 kg)  06/30/22 154 lb 1.6 oz (69.9 kg)    Physical Exam Vitals and nursing note reviewed.  Constitutional:      General: She is not in acute distress.    Appearance: Normal appearance. She is not ill-appearing, toxic-appearing or diaphoretic.  HENT:     Head: Normocephalic and atraumatic.     Right Ear: External ear normal.     Left Ear: External ear normal.     Nose: Nose normal.     Mouth/Throat:     Mouth: Mucous membranes are moist.     Pharynx: Oropharynx is clear.  Eyes:     General: No scleral icterus.       Right eye: No discharge.        Left eye: No discharge.     Extraocular Movements: Extraocular movements intact.     Conjunctiva/sclera: Conjunctivae normal.     Pupils: Pupils are equal, round, and reactive to light.  Cardiovascular:     Rate and Rhythm: Normal rate and regular rhythm.     Pulses: Normal pulses.     Heart sounds: Normal heart sounds. No murmur heard.    No friction rub. No gallop.  Pulmonary:     Effort: Pulmonary effort is normal. No respiratory distress.     Breath sounds: Normal breath sounds. No  stridor. No wheezing, rhonchi or rales.  Chest:     Chest wall: No tenderness.  Musculoskeletal:        General: Normal range of motion.     Cervical back: Normal range of motion and neck supple.  Skin:    General: Skin is warm and dry.     Capillary Refill: Capillary refill takes less than 2 seconds.     Coloration: Skin is not jaundiced or pale.     Findings: No bruising, erythema, lesion or rash.  Neurological:     General: No focal deficit present.     Mental Status: She is alert and oriented to person, place, and time. Mental status is at baseline.  Psychiatric:        Mood and Affect: Mood normal.         Behavior: Behavior normal.        Thought Content: Thought content normal.        Judgment: Judgment normal.     Results for orders placed or performed in visit on 06/30/22  Novel Coronavirus, NAA ([facility])   Specimen: Nasopharyngeal(NP) swabs in vial transport medium  Result Value Ref Range   SARS-CoV-2, NAA Detected (A) Not Detected  Rapid Strep screen([facility])   Specimen: Other   Other  Result Value Ref Range   Strep Gp A Ag, IA W/Reflex Negative Negative  Culture, Group A Strep   Other  Result Value Ref Range   Strep A Culture Negative   Veritor Flu A/B Waived  Result Value Ref Range   Influenza A Negative Negative   Influenza B Negative Negative      Assessment & Plan:   Problem List Items Addressed This Visit   None Visit Diagnoses     Acute nonintractable headache, unspecified headache type    -  Primary   Resolved with medicine. Feeling well. Continue nortriptyline. Refills given. Call with any concerns.   Dizziness       Seems to be linked to the prednisone. Has 2 doses left. Finish medicine. Call if not totally resolved after finishes her medicine.        Follow up plan: Return October, for physical.

## 2022-08-20 ENCOUNTER — Telehealth: Payer: Self-pay

## 2022-08-20 NOTE — Telephone Encounter (Signed)
Patient informed of pathology results 

## 2022-08-20 NOTE — Telephone Encounter (Signed)
-----   Message from Deirdre Evener, MD sent at 08/19/2022 11:23 AM EDT ----- Diagnosis Skin , left middle medial calf DYSPLASTIC JUNCTIONAL LENTIGINOUS NEVUS WITH MODERATE TO SEVERE ATYPIA, CLOSE TO MARGIN, SEE DESCRIPTION  Moderate to Severe dysplastic Margins clear, but "close to margin" May need additional procedure Recheck next visit

## 2022-08-25 ENCOUNTER — Other Ambulatory Visit: Payer: Self-pay

## 2022-08-25 ENCOUNTER — Ambulatory Visit: Payer: Commercial Managed Care - PPO | Admitting: Family Medicine

## 2022-08-25 VITALS — BP 104/71 | HR 85 | Temp 98.1°F | Ht 64.96 in | Wt 154.6 lb

## 2022-08-25 DIAGNOSIS — G43001 Migraine without aura, not intractable, with status migrainosus: Secondary | ICD-10-CM

## 2022-08-25 MED ORDER — CYCLOBENZAPRINE HCL 10 MG PO TABS
10.0000 mg | ORAL_TABLET | Freq: Three times a day (TID) | ORAL | 0 refills | Status: DC | PRN
Start: 2022-08-25 — End: 2022-12-12
  Filled 2022-08-25: qty 30, 10d supply, fill #0

## 2022-08-25 MED ORDER — KETOROLAC TROMETHAMINE 60 MG/2ML IM SOLN
30.0000 mg | Freq: Once | INTRAMUSCULAR | Status: AC
Start: 2022-08-25 — End: 2022-08-25
  Administered 2022-08-25: 30 mg via INTRAMUSCULAR

## 2022-08-25 MED ORDER — SUMATRIPTAN SUCCINATE 25 MG PO TABS
25.0000 mg | ORAL_TABLET | ORAL | 0 refills | Status: DC | PRN
Start: 2022-08-25 — End: 2023-01-12
  Filled 2022-08-25: qty 18, 30d supply, fill #0

## 2022-08-25 MED ORDER — NORTRIPTYLINE HCL 50 MG PO CAPS
50.0000 mg | ORAL_CAPSULE | Freq: Every day | ORAL | 3 refills | Status: DC
Start: 2022-08-25 — End: 2022-09-02
  Filled 2022-08-25: qty 30, 30d supply, fill #0

## 2022-08-25 NOTE — Patient Instructions (Addendum)
Take Nortriptyline nightly for prevention of headache Take Sumatriptan (Imitrex) at the start of the headache to stop it.  Take Flexeril for muscle pain relief tonight Okay for Ibuprofen every 6-8 hours as needed

## 2022-08-25 NOTE — Progress Notes (Signed)
BP 104/71   Pulse 85   Temp 98.1 F (36.7 C) (Oral)   Ht 5' 4.96" (1.65 m)   Wt 154 lb 9.6 oz (70.1 kg)   SpO2 96%   BMI 25.76 kg/m    Subjective:    Patient ID: Karen Rolling., female    DOB: 08-02-1970, 52 y.o.   MRN: [id number]  HPI: Karen Walker is a 52 y.o. female  Chief Complaint  Patient presents with   Headache    Started yesterday. Right side and radiated to back of neck, hurts to lay down on pillow, also having nausea.   MIGRAINES She is taking Nortriptyline, she recently missed x3 days of her dose. She describes it as a migraine that started on her right side and radiates to the back of her head and the left side. She complains of nausea, neck tightness, and teeth pain associated with her migraine. She admits that her headaches are daily and yesterday was the worse.   Duration:1 days Onset: gradual Severity: 8/10 Quality: pressure-like and throbbing Frequency: constant Location: right and left temporal, back of head Radiation: yes started on right but at night when lying down it radiates to the left side and back of her head. Alleviating factors: cold bath helped a little, tried ibuprofen in the past but sometimes causes nausea Aggravating factors: light, sound Headache status at time of visit: current headache Treatments attempted: cold bath    Aura: No Nausea:  yes Vomiting: no Photophobia:  yes Phonophobia:  no Effect on social functioning:  yes Numbers of missed days of school/work each month: None Confusion:  yes Gait disturbance/ataxia:  yes Behavioral changes:  yes irritable.  Fevers:  no  Relevant past medical, surgical, family and social history reviewed and updated as indicated. Interim medical history since our last visit reviewed. Allergies and medications reviewed and updated.  Review of Systems  Constitutional:  Negative for fever.  Eyes:  Positive for photophobia.  Respiratory: Negative.    Cardiovascular: Negative.    Gastrointestinal:  Negative for nausea and vomiting.  Musculoskeletal:  Positive for neck pain. Negative for gait problem.  Neurological:  Positive for headaches. Negative for dizziness, speech difficulty, weakness and light-headedness.  Psychiatric/Behavioral:  Positive for confusion.     Per HPI unless specifically indicated above     Objective:    BP 104/71   Pulse 85   Temp 98.1 F (36.7 C) (Oral)   Ht 5' 4.96" (1.65 m)   Wt 154 lb 9.6 oz (70.1 kg)   SpO2 96%   BMI 25.76 kg/m   Wt Readings from Last 3 Encounters:  08/25/22 154 lb 9.6 oz (70.1 kg)  08/15/22 158 lb 4.8 oz (71.8 kg)  08/04/22 159 lb 12.8 oz (72.5 kg)    Physical Exam Vitals and nursing note reviewed.  Constitutional:      General: She is awake. She is not in acute distress.    Appearance: Normal appearance. She is well-developed and well-groomed. She is not ill-appearing.  HENT:     Head: Normocephalic and atraumatic.     Right Ear: Hearing and external ear normal. No drainage.     Left Ear: Hearing and external ear normal. No drainage.     Nose: Nose normal.  Eyes:     General: Lids are normal.        Right eye: No discharge.        Left eye: No discharge.     Extraocular Movements: Extraocular movements  intact.     Conjunctiva/sclera: Conjunctivae normal.  Cardiovascular:     Rate and Rhythm: Normal rate and regular rhythm.     Pulses:          Carotid pulses are 2+ on the right side and 2+ on the left side.    Heart sounds: Normal heart sounds, S1 normal and S2 normal. No murmur heard.    No gallop.  Pulmonary:     Effort: Pulmonary effort is normal. No accessory muscle usage or respiratory distress.     Breath sounds: Normal breath sounds.  Musculoskeletal:        General: Normal range of motion.     Cervical back: Full passive range of motion without pain and normal range of motion.     Right lower leg: No edema.     Left lower leg: No edema.  Skin:    General: Skin is warm and dry.      Capillary Refill: Capillary refill takes less than 2 seconds.  Neurological:     Mental Status: She is alert and oriented to person, place, and time.     Cranial Nerves: No cranial nerve deficit, dysarthria or facial asymmetry.     Sensory: Sensation is intact.     Motor: No weakness or tremor.  Psychiatric:        Attention and Perception: Attention normal.        Mood and Affect: Affect is tearful.        Speech: Speech normal.        Behavior: Behavior normal. Behavior is cooperative.        Thought Content: Thought content normal.     Results for orders placed or performed in visit on 06/30/22  Novel Coronavirus, NAA ([facility])   Specimen: Nasopharyngeal(NP) swabs in vial transport medium  Result Value Ref Range   SARS-CoV-2, NAA Detected (A) Not Detected  Rapid Strep screen([facility])   Specimen: Other   Other  Result Value Ref Range   Strep Gp A Ag, IA W/Reflex Negative Negative  Culture, Group A Strep   Other  Result Value Ref Range   Strep A Culture Negative   Veritor Flu A/B Waived  Result Value Ref Range   Influenza A Negative Negative   Influenza B Negative Negative      Assessment & Plan:   Problem List Items Addressed This Visit     Migraine without aura and with status migrainosus, not intractable - Primary    Acute, ongoing. 30mg  Toradol given today. Recommend Sumatriptan 25mg  and Flexeril 10mg  PRN for headache and neck pain. Increased Nortriptyline to 50mg  nightly.  F/u in one week, if no improvement will consider adding CGRP agent for better management.       Relevant Medications   SUMAtriptan (IMITREX) 25 MG tablet   cyclobenzaprine (FLEXERIL) 10 MG tablet   nortriptyline (PAMELOR) 50 MG capsule     Follow up plan: Return in about 1 week (around 09/01/2022) for headache.

## 2022-08-25 NOTE — Assessment & Plan Note (Signed)
Acute, ongoing. 30mg  Toradol given today. Recommend Sumatriptan 25mg  and Flexeril 10mg  PRN for headache and neck pain. Increased Nortriptyline to 50mg  nightly.  F/u in one week, if no improvement will consider adding CGRP agent for better management.

## 2022-08-26 ENCOUNTER — Other Ambulatory Visit: Payer: Self-pay

## 2022-09-02 ENCOUNTER — Encounter: Payer: Self-pay | Admitting: Family Medicine

## 2022-09-02 ENCOUNTER — Ambulatory Visit: Payer: Commercial Managed Care - PPO | Admitting: Family Medicine

## 2022-09-02 ENCOUNTER — Other Ambulatory Visit: Payer: Self-pay

## 2022-09-02 VITALS — BP 99/66 | HR 98 | Temp 97.9°F | Wt 161.0 lb

## 2022-09-02 DIAGNOSIS — K117 Disturbances of salivary secretion: Secondary | ICD-10-CM

## 2022-09-02 DIAGNOSIS — G43001 Migraine without aura, not intractable, with status migrainosus: Secondary | ICD-10-CM | POA: Diagnosis not present

## 2022-09-02 DIAGNOSIS — T50905A Adverse effect of unspecified drugs, medicaments and biological substances, initial encounter: Secondary | ICD-10-CM | POA: Diagnosis not present

## 2022-09-02 MED ORDER — NORTRIPTYLINE HCL 25 MG PO CAPS
25.0000 mg | ORAL_CAPSULE | Freq: Every day | ORAL | 0 refills | Status: DC
Start: 2022-09-02 — End: 2023-01-12
  Filled 2022-09-02: qty 60, 60d supply, fill #0

## 2022-09-02 MED ORDER — NORTRIPTYLINE HCL 25 MG PO CAPS
25.0000 mg | ORAL_CAPSULE | Freq: Every day | ORAL | 0 refills | Status: DC
Start: 2022-09-02 — End: 2022-09-02
  Filled 2022-09-02: qty 30, 30d supply, fill #0

## 2022-09-02 MED ORDER — NURTEC 75 MG PO TBDP
75.0000 mg | ORAL_TABLET | Freq: Every day | ORAL | 0 refills | Status: DC | PRN
Start: 2022-09-02 — End: 2023-01-12
  Filled 2022-09-02: qty 8, 8d supply, fill #0

## 2022-09-02 NOTE — Patient Instructions (Addendum)
Try lemon drop candies for dry mouth Take Sumatriptan for headache as needed (To abort the headache) Take Nurtec dissolvable tablet as needed if Sumatriptan does not work on a different day (to abort the headache)  Do not take Sumatriptan and Nurtec together

## 2022-09-02 NOTE — Assessment & Plan Note (Addendum)
Acute, ongoing. Recommend use of 25mg  Sumatriptan PRN, if no relief try 75mg  Nurtec PRN. Decreased Nortriptyline from 50 mg to 25 mg nightly with Flexeril 10mg  PRN.

## 2022-09-02 NOTE — Progress Notes (Signed)
BP 99/66   Pulse 98   Temp 97.9 F (36.6 C) (Oral)   Wt 161 lb (73 kg)   SpO2 97%   BMI 26.82 kg/m    Subjective:    Patient ID: Karen Walker., female    DOB: December 07, 1970, 52 y.o.   MRN: [id number]  HPI: Karen Walker is a 52 y.o. female  Chief Complaint  Patient presents with   Headache    Pt states her headaches are better, but still having bilateral pressure near her temples daily    MIGRAINES She is taking Nortriptyline 50 mg nightly, she says the Nortriptyline is making her feel more tired at work, feels like she cannot move like she used to before and is causing her to have dry mouth. She is taking 10 mg Flexeril PRN x1-2/day and admits this has provided relief for her. She has not taken the Sumatriptan 25 mg PRN.   Duration: days Onset: gradual Severity: 3/10 Quality: aching Frequency: constant Location: Bilateral temple Headache duration: 1 week  Radiation: no Time of day headache occurs:  Alleviating factors: Flexeril and Nortriptyline Aggravating factors: None Headache status at time of visit: current headache Treatments attempted: Treatments attempted:  Nortriptyline    Aura: no Nausea:  no Vomiting: no Photophobia:  no Phonophobia:  no Effect on social functioning:  no Numbers of missed days of school/work each month: None Confusion:  No Gait disturbance/ataxia:  no Behavioral changes:  no Fevers:  no   Relevant past medical, surgical, family and social history reviewed and updated as indicated. Interim medical history since our last visit reviewed. Allergies and medications reviewed and updated.  Review of Systems  Constitutional:  Negative for fever.  HENT:         Dry mouth  Respiratory: Negative.    Cardiovascular: Negative.   Gastrointestinal:  Negative for nausea and vomiting.  Musculoskeletal:  Negative for gait problem, neck pain and neck stiffness.  Neurological:  Positive for headaches. Negative for dizziness, seizures, syncope,  speech difficulty, weakness, light-headedness and numbness.       Bilateral temporal pressure    Per HPI unless specifically indicated above     Objective:    BP 99/66   Pulse 98   Temp 97.9 F (36.6 C) (Oral)   Wt 161 lb (73 kg)   SpO2 97%   BMI 26.82 kg/m   Wt Readings from Last 3 Encounters:  09/02/22 161 lb (73 kg)  08/25/22 154 lb 9.6 oz (70.1 kg)  08/15/22 158 lb 4.8 oz (71.8 kg)    Physical Exam Vitals and nursing note reviewed.  Constitutional:      General: She is awake. She is not in acute distress.    Appearance: Normal appearance. She is well-developed and well-groomed. She is not ill-appearing.  HENT:     Head: Normocephalic and atraumatic.     Comments: Bilateral tenderness in temporal region    Right Ear: Hearing and external ear normal. No drainage.     Left Ear: Hearing and external ear normal. No drainage.     Nose: Nose normal.     Mouth/Throat:     Mouth: Mucous membranes are dry.  Eyes:     General: Lids are normal.        Right eye: No discharge.        Left eye: No discharge.     Conjunctiva/sclera: Conjunctivae normal.  Cardiovascular:     Rate and Rhythm: Normal rate and regular rhythm.  Heart sounds: Normal heart sounds, S1 normal and S2 normal. No murmur heard.    No gallop.  Pulmonary:     Effort: Pulmonary effort is normal. No accessory muscle usage or respiratory distress.     Breath sounds: Normal breath sounds.  Musculoskeletal:        General: Normal range of motion.     Cervical back: Full passive range of motion without pain and normal range of motion.     Right lower leg: No edema.     Left lower leg: No edema.  Skin:    General: Skin is warm and dry.     Capillary Refill: Capillary refill takes less than 2 seconds.  Neurological:     Mental Status: She is alert and oriented to person, place, and time.     Cranial Nerves: Cranial nerves 2-12 are intact.     Sensory: Sensation is intact.     Motor: Motor function is  intact. No weakness.     Coordination: Finger-Nose-Finger Test normal.  Psychiatric:        Attention and Perception: Attention normal.        Mood and Affect: Mood normal.        Speech: Speech normal.        Behavior: Behavior normal. Behavior is cooperative.        Thought Content: Thought content normal.     Results for orders placed or performed in visit on 06/30/22  Novel Coronavirus, NAA ([facility])   Specimen: Nasopharyngeal(NP) swabs in vial transport medium  Result Value Ref Range   SARS-CoV-2, NAA Detected (A) Not Detected  Rapid Strep screen([facility])   Specimen: Other   Other  Result Value Ref Range   Strep Gp A Ag, IA W/Reflex Negative Negative  Culture, Group A Strep   Other  Result Value Ref Range   Strep A Culture Negative   Veritor Flu A/B Waived  Result Value Ref Range   Influenza A Negative Negative   Influenza B Negative Negative      Assessment & Plan:   Problem List Items Addressed This Visit     Migraine without aura and with status migrainosus, not intractable - Primary    Acute, ongoing. Recommend use of 25mg  Sumatriptan PRN, if no relief try 75mg  Nurtec PRN. Decreased Nortriptyline from 50 mg to 25 mg nightly with Flexeril 10mg  PRN.       Relevant Medications   Rimegepant Sulfate (NURTEC) 75 MG TBDP   nortriptyline (PAMELOR) 25 MG capsule   Other Visit Diagnoses     Drug-induced xerostomia       Acute, ongoing. Recommend lemon drop candy to help with this. Decreased Nortriptylin dosage to 25mg , believed to be contributor for this.        Follow up plan: Return in about 1 month (around 10/03/2022), or if symptoms worsen or fail to improve, for migraines.

## 2022-09-03 ENCOUNTER — Telehealth: Payer: Self-pay

## 2022-09-03 NOTE — Telephone Encounter (Signed)
PA for Nurtec initiated and submitted via Cover My Meds. Key: [id number]

## 2022-09-09 ENCOUNTER — Other Ambulatory Visit: Payer: Self-pay

## 2022-09-12 ENCOUNTER — Other Ambulatory Visit: Payer: Self-pay

## 2022-10-25 IMAGING — CR DG CHEST 2V
1 series · 2 of 2 positions shown · non-contrast
Comparison: Radiograph 02/18/2012

CLINICAL DATA: Shortness of breath post COVID

EXAM:
CHEST - 2 VIEW

[Series 1: dg chest 2 view · 0.14mm/px · 2 of 2 slices shown]
[im 1/2]
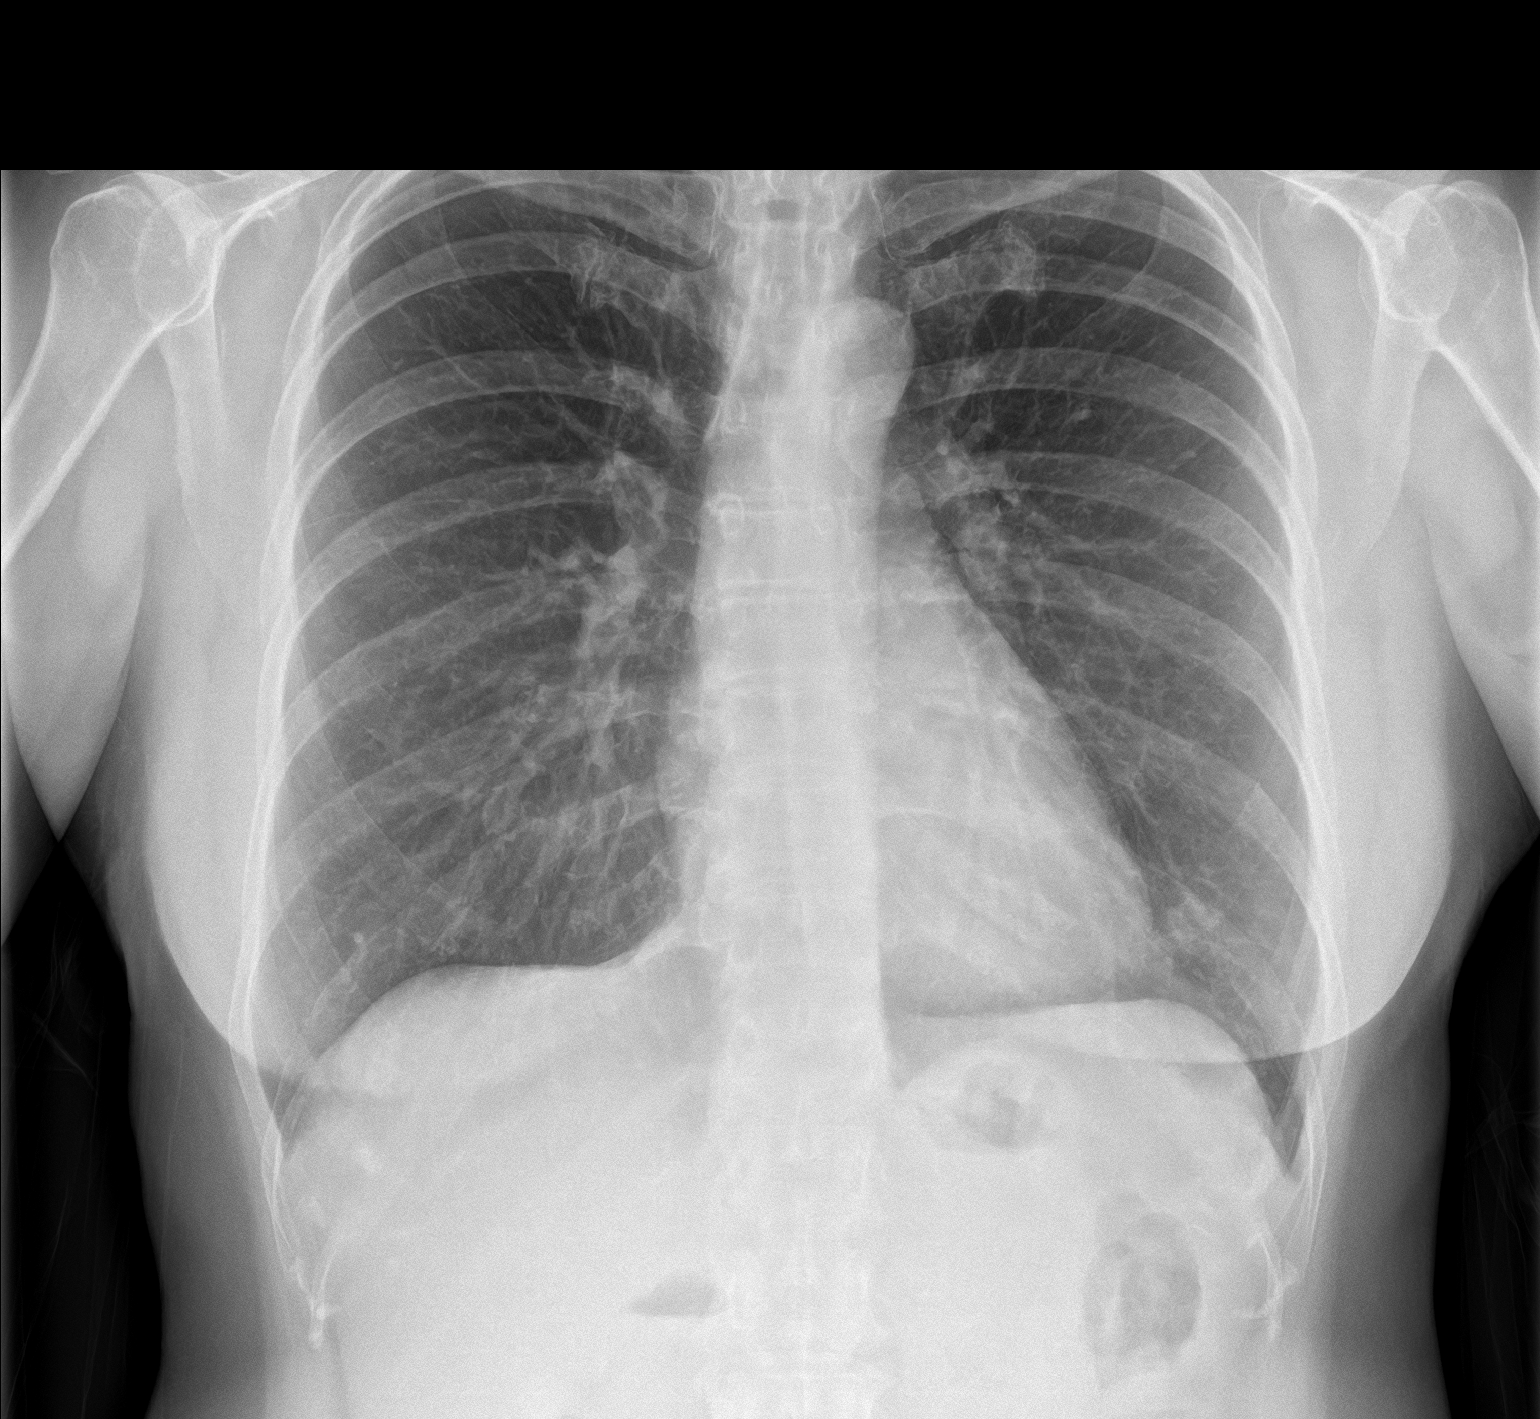
[im 2/2]
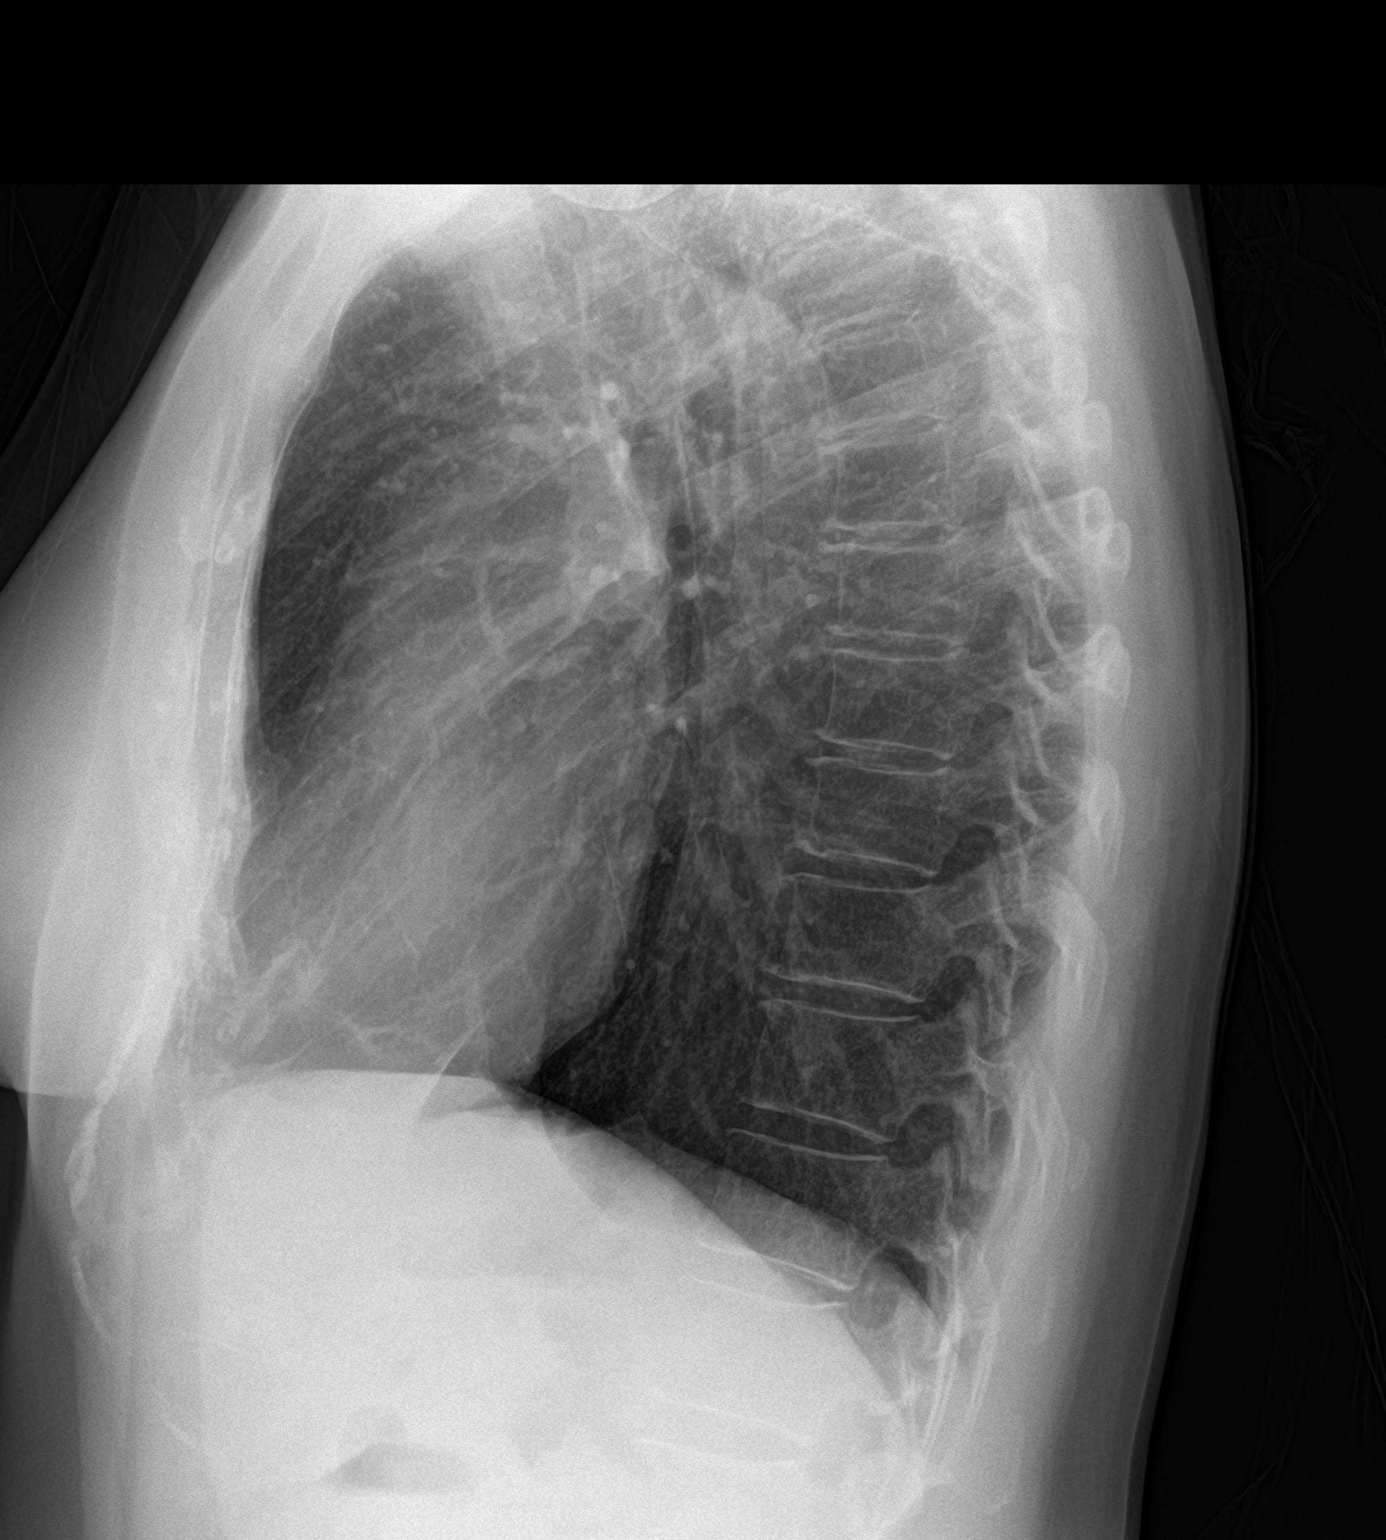

[2 of 2 positions shown; findings below may reference images not displayed]

FINDINGS: Mild airways thickening and some streaky opacities which are present
in the periphery of the left lung base. Could reflect some residual
infectious or inflammatory sequela or atelectatic change. The
cardiomediastinal contours are unremarkable. No acute osseous or
soft tissue abnormality.
IMPRESSION: Mild airways thickening and some streaky opacities in the periphery
of the left lung base. Could reflect some residual infectious or
inflammatory sequela or atelectatic change.

These results will be called to the ordering clinician or
representative by the Radiologist Assistant, and communication
documented in the PACS or [REDACTED].

## 2022-12-12 ENCOUNTER — Other Ambulatory Visit: Payer: Self-pay

## 2022-12-12 ENCOUNTER — Emergency Department: Payer: Commercial Managed Care - PPO

## 2022-12-12 ENCOUNTER — Emergency Department
Admission: EM | Admit: 2022-12-12 | Discharge: 2022-12-12 | Disposition: A | Discharge status: Home / Self Care | Payer: Commercial Managed Care - PPO | Attending: Emergency Medicine | Admitting: Emergency Medicine

## 2022-12-12 ENCOUNTER — Ambulatory Visit (INDEPENDENT_AMBULATORY_CARE_PROVIDER_SITE_OTHER): Payer: Commercial Managed Care - PPO | Admitting: Family Medicine

## 2022-12-12 ENCOUNTER — Ambulatory Visit: Payer: Self-pay | Admitting: *Deleted

## 2022-12-12 VITALS — HR 95 | Wt 157.4 lb

## 2022-12-12 DIAGNOSIS — R0789 Other chest pain: Secondary | ICD-10-CM | POA: Diagnosis not present

## 2022-12-12 DIAGNOSIS — R079 Chest pain, unspecified: Secondary | ICD-10-CM | POA: Diagnosis not present

## 2022-12-12 DIAGNOSIS — M5442 Lumbago with sciatica, left side: Secondary | ICD-10-CM | POA: Insufficient documentation

## 2022-12-12 DIAGNOSIS — M546 Pain in thoracic spine: Secondary | ICD-10-CM | POA: Diagnosis not present

## 2022-12-12 DIAGNOSIS — G43001 Migraine without aura, not intractable, with status migrainosus: Secondary | ICD-10-CM

## 2022-12-12 DIAGNOSIS — Y92410 Unspecified street and highway as the place of occurrence of the external cause: Secondary | ICD-10-CM | POA: Diagnosis not present

## 2022-12-12 DIAGNOSIS — M79631 Pain in right forearm: Secondary | ICD-10-CM

## 2022-12-12 DIAGNOSIS — M542 Cervicalgia: Secondary | ICD-10-CM | POA: Diagnosis not present

## 2022-12-12 DIAGNOSIS — M545 Low back pain, unspecified: Secondary | ICD-10-CM | POA: Diagnosis not present

## 2022-12-12 MED ORDER — CYCLOBENZAPRINE HCL 10 MG PO TABS
10.0000 mg | ORAL_TABLET | Freq: Three times a day (TID) | ORAL | 0 refills | Status: DC | PRN
Start: 2022-12-12 — End: 2023-01-01
  Filled 2022-12-12: qty 30, 10d supply, fill #0

## 2022-12-12 NOTE — Progress Notes (Signed)
Went to hospital, was not seen.
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
## 2022-12-12 NOTE — Discharge Instructions (Addendum)
Please use ibuprofen (Motrin) up to 800 mg every 8 hours, naproxen (Naprosyn) up to 500 mg every 12 hours, and/or acetaminophen (Tylenol) up to 4 g/day for any continued pain.  Please do not use this medication regimen for longer than 7 days
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
## 2022-12-14 ENCOUNTER — Other Ambulatory Visit: Payer: Self-pay

## 2022-12-15 ENCOUNTER — Other Ambulatory Visit: Payer: Self-pay

## 2022-12-15 NOTE — Group Note (Deleted)

## 2022-12-17 ENCOUNTER — Other Ambulatory Visit: Payer: Self-pay | Admitting: Family Medicine

## 2022-12-17 ENCOUNTER — Other Ambulatory Visit: Payer: Self-pay

## 2022-12-18 ENCOUNTER — Other Ambulatory Visit: Payer: Self-pay

## 2022-12-18 ENCOUNTER — Ambulatory Visit: Payer: Commercial Managed Care - PPO | Admitting: Pediatrics

## 2022-12-18 ENCOUNTER — Encounter: Payer: Self-pay | Admitting: Pediatrics

## 2022-12-18 ENCOUNTER — Ambulatory Visit: Payer: Self-pay

## 2022-12-18 VITALS — BP 99/64 | HR 84 | Temp 98.0°F | Ht 64.0 in | Wt 160.2 lb

## 2022-12-18 DIAGNOSIS — K219 Gastro-esophageal reflux disease without esophagitis: Secondary | ICD-10-CM

## 2022-12-18 DIAGNOSIS — T7840XA Allergy, unspecified, initial encounter: Secondary | ICD-10-CM

## 2022-12-18 DIAGNOSIS — F331 Major depressive disorder, recurrent, moderate: Secondary | ICD-10-CM | POA: Diagnosis not present

## 2022-12-18 MED ORDER — OMEPRAZOLE 40 MG PO CPDR
40.0000 mg | DELAYED_RELEASE_CAPSULE | Freq: Every day | ORAL | 3 refills | Status: DC | PRN
Start: 2022-12-18 — End: 2023-01-12
  Filled 2022-12-18: qty 30, 30d supply, fill #0

## 2022-12-18 MED ORDER — TRIAMCINOLONE ACETONIDE 0.5 % EX OINT
1.0000 | TOPICAL_OINTMENT | Freq: Two times a day (BID) | CUTANEOUS | 0 refills | Status: AC
  Filled 2022-12-18 – 2023-01-02 (×2): qty 30, 15d supply, fill #0

## 2022-12-18 MED ORDER — DIPHENHYDRAMINE HCL 25 MG PO CAPS
25.0000 mg | ORAL_CAPSULE | Freq: Four times a day (QID) | ORAL | 0 refills | Status: AC | PRN
  Filled 2022-12-18: qty 30, 8d supply, fill #0

## 2022-12-18 NOTE — Telephone Encounter (Signed)
Medication not on list. Requested Prescriptions  Refused Prescriptions Disp Refills   omeprazole (PRILOSEC) 20 MG capsule 28 capsule 0    Sig: Take 1 capsule (20 mg total) by mouth in the morning and at bedtime for 14 days.     There is no refill protocol information for this order

## 2022-12-18 NOTE — Patient Instructions (Signed)
Benodryl cada 6-8 horas, mande la crema [employer] medicina para el reflujo

## 2022-12-18 NOTE — Telephone Encounter (Signed)
     Chief Complaint: Red rash all over after eating a taco yesterday. Very itchy, sore throat. Symptoms: Above Frequency: Yesterday Pertinent Negatives: Patient denies SOB or difficulty swallowing. Disposition: [] ED /[] Urgent Care (no appt availability in office) / [x] Appointment(In office/virtual)/ []  [place] [facility]/ [] Home Care/ [] Refused Recommended Disposition /[] [place] [facility]/ []  Follow-up with PCP Additional Notes: Agrees with appointment. Instructed to call back for worsening of symptoms.  Reason for Disposition  SEVERE itching (i.e., interferes with sleep, normal activities or school)  Answer Assessment - Initial Assessment Questions 1. APPEARANCE of RASH: "Describe the rash." (e.g., spots, blisters, raised areas, skin peeling, scaly)     Big red patches 2. SIZE: "How big are the spots?" (e.g., tip of pen, eraser, coin; inches, centimeters)     Large 3. LOCATION: "Where is the rash located?"     All over, neck, ears 4. COLOR: "What color is the rash?" (Note: It is difficult to assess rash color in people with darker-colored skin. When this situation occurs, simply ask the caller to describe what they see.)     Red 5. ONSET: "When did the rash begin?"     Yesterday 6. FEVER: "Do you have a fever?" If Yes, ask: "What is your temperature, how was it measured, and when did it start?"     No 7. ITCHING: "Does the rash itch?" If Yes, ask: "How bad is the itch?" (Scale 1-10; or mild, moderate, severe)     Severe 8. CAUSE: "What do you think is causing the rash?"     Taco 9. MEDICINE FACTORS: "Have you started any new medicines within the last 2 weeks?" (e.g., antibiotics)      No 10. OTHER SYMPTOMS: "Do you have any other symptoms?" (e.g., dizziness, headache, sore throat, joint pain)       Sore throat 11. PREGNANCY: "Is there any chance you are pregnant?" "When was your last menstrual period?"       No  Protocols used: Rash or Redness - [facility]
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
## 2022-12-19 ENCOUNTER — Other Ambulatory Visit: Payer: Self-pay

## 2022-12-22 ENCOUNTER — Encounter: Payer: Self-pay | Admitting: Family Medicine

## 2022-12-30 ENCOUNTER — Other Ambulatory Visit: Payer: Self-pay

## 2023-01-01 ENCOUNTER — Other Ambulatory Visit: Payer: Self-pay

## 2023-01-01 ENCOUNTER — Ambulatory Visit: Payer: Commercial Managed Care - PPO | Admitting: Family Medicine

## 2023-01-01 ENCOUNTER — Encounter: Payer: Self-pay | Admitting: Family Medicine

## 2023-01-01 VITALS — BP 104/70 | HR 85 | Temp 97.7°F | Ht 64.0 in | Wt 160.4 lb

## 2023-01-01 DIAGNOSIS — S134XXD Sprain of ligaments of cervical spine, subsequent encounter: Secondary | ICD-10-CM

## 2023-01-01 DIAGNOSIS — M4722 Other spondylosis with radiculopathy, cervical region: Secondary | ICD-10-CM | POA: Diagnosis not present

## 2023-01-01 DIAGNOSIS — Z23 Encounter for immunization: Secondary | ICD-10-CM

## 2023-01-01 MED ORDER — CYCLOBENZAPRINE HCL 10 MG PO TABS
10.0000 mg | ORAL_TABLET | Freq: Every evening | ORAL | 2 refills | Status: DC | PRN
  Filled 2023-01-01: qty 30, 30d supply, fill #0
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
## 2023-01-01 NOTE — Assessment & Plan Note (Signed)
No better. Now out of work. Will get her set up with PT and treat with flexeril. Recheck in 3-4 weeks. Call with any concerns.

## 2023-01-02 ENCOUNTER — Other Ambulatory Visit: Payer: Self-pay

## 2023-01-06 ENCOUNTER — Ambulatory Visit: Payer: Commercial Managed Care - PPO | Admitting: Physical Therapy

## 2023-01-08 ENCOUNTER — Ambulatory Visit: Payer: Commercial Managed Care - PPO

## 2023-01-12 ENCOUNTER — Other Ambulatory Visit: Payer: Self-pay

## 2023-01-12 ENCOUNTER — Ambulatory Visit (INDEPENDENT_AMBULATORY_CARE_PROVIDER_SITE_OTHER): Payer: Commercial Managed Care - PPO | Admitting: Family Medicine

## 2023-01-12 ENCOUNTER — Encounter: Payer: Self-pay | Admitting: Family Medicine

## 2023-01-12 VITALS — BP 108/67 | HR 79 | Ht 64.0 in | Wt 160.6 lb

## 2023-01-12 DIAGNOSIS — Z0000 Encounter for general adult medical examination without abnormal findings: Secondary | ICD-10-CM | POA: Diagnosis not present

## 2023-01-12 DIAGNOSIS — K219 Gastro-esophageal reflux disease without esophagitis: Secondary | ICD-10-CM | POA: Diagnosis not present

## 2023-01-12 DIAGNOSIS — Z1231 Encounter for screening mammogram for malignant neoplasm of breast: Secondary | ICD-10-CM | POA: Diagnosis not present

## 2023-01-12 DIAGNOSIS — G43001 Migraine without aura, not intractable, with status migrainosus: Secondary | ICD-10-CM | POA: Diagnosis not present

## 2023-01-12 MED ORDER — OMEPRAZOLE 40 MG PO CPDR
40.0000 mg | DELAYED_RELEASE_CAPSULE | Freq: Every day | ORAL | 1 refills | Status: DC | PRN
Start: 2023-01-12 — End: 2024-01-14
  Filled 2023-01-12: qty 90, 90d supply, fill #0
  Filled 2023-08-10: qty 90, 90d supply, fill #1

## 2023-01-12 MED ORDER — SUMATRIPTAN SUCCINATE 25 MG PO TABS
25.0000 mg | ORAL_TABLET | ORAL | 0 refills | Status: DC
  Filled 2023-01-12: qty 9, 30d supply, fill #0

## 2023-01-12 MED ORDER — MONTELUKAST SODIUM 10 MG PO TABS
10.0000 mg | ORAL_TABLET | Freq: Every day | ORAL | 1 refills | Status: DC
  Filled 2023-01-12 – 2023-08-10 (×2): qty 90, 90d supply, fill #0

## 2023-01-12 MED ORDER — NORTRIPTYLINE HCL 25 MG PO CAPS
25.0000 mg | ORAL_CAPSULE | Freq: Every day | ORAL | 1 refills | Status: DC
  Filled 2023-01-12: qty 30, 30d supply, fill #0

## 2023-01-12 MED ORDER — DICLOFENAC SODIUM 1 % EX GEL
4.0000 g | Freq: Four times a day (QID) | CUTANEOUS | 2 refills | Status: DC
  Filled 2023-01-12: qty 300, 20d supply, fill #0
  Filled 2023-08-10: qty 400, 27d supply, fill #1

## 2023-01-12 MED ORDER — NURTEC 75 MG PO TBDP
75.0000 mg | ORAL_TABLET | Freq: Every day | ORAL | 3 refills | Status: DC | PRN
Start: 2023-01-12 — End: 2023-12-31
  Filled 2023-01-12: qty 8, 15d supply, fill #0

## 2023-01-12 MED ORDER — CETIRIZINE HCL 10 MG PO TABS
10.0000 mg | ORAL_TABLET | Freq: Every day | ORAL | 3 refills | Status: DC
  Filled 2023-01-12 – 2023-08-10 (×2): qty 90, 90d supply, fill #0

## 2023-01-12 NOTE — Assessment & Plan Note (Signed)
Under good control on current regimen. Continue current regimen. Continue to monitor. Call with any concerns. Refills given.   

## 2023-01-12 NOTE — Progress Notes (Signed)
BP 108/67   Pulse 79   Ht 5\' 4"  (1.626 m)   Wt 160 lb 9.6 oz (72.8 kg)   SpO2 98%   BMI 27.57 kg/m    Subjective:    Patient ID: Karen Rolling., female    DOB: [birth year], 51 y.o.   MRN: [id number]  HPI: Karen Walker is a 52 y.o. female presenting on 01/12/2023 for comprehensive medical examination. Current medical complaints include:  Neck is still hurting. Has not been able to do PT yet.   She currently lives with: husband and kids Menopausal Symptoms: no  Depression Screen done today and results listed below:     01/12/2023    1:45 PM 01/01/2023    3:55 PM 12/18/2022    3:40 PM 09/02/2022    1:29 PM 08/25/2022    1:32 PM  Depression screen PHQ 2/9  Decreased Interest 1 1 1 1 1   Down, Depressed, Hopeless 1 1 1 1 1   PHQ - 2 Score 2 2 2 2 2   Altered sleeping 1 1 1 1 1   Tired, decreased energy 1 1 1 1 1   Change in appetite 1 0 1 1 1   Feeling bad or failure about yourself  1 0 1 1 1   Trouble concentrating 1 0 1 1 1   Moving slowly or fidgety/restless 1 0 1 1 1   Suicidal thoughts 1 0 1 0 1  PHQ-9 Score 9 4 9 8 9   Difficult doing work/chores Not difficult at all Somewhat difficult Somewhat difficult Somewhat difficult Not difficult at all     Past Medical History:  Past Medical History:  Diagnosis Date   Allergic rhinitis    Anxiety    Breast lump on right side at 8 o'clock position 12/06/2014   Depression    Dysplastic nevus 02/14/2020   Mod to severe - close to margin R foot inf to her med maleolus   Dysplastic nevus 08/07/2022   L middle medical calf - moderate to severe, recheck at next visit   History of skin cancer    Hx of migraine headaches    light sensivity, unilateral HA   Plantar fasciitis 09/06/2019   Urticaria     Surgical History:  Past Surgical History:  Procedure Laterality Date   BREAST CYST ASPIRATION Right 2013   negative. Done at Dr. Rutherford Nail office   COLONOSCOPY WITH PROPOFOL N/A 07/20/2020   Procedure: COLONOSCOPY WITH PROPOFOL;   Surgeon: Pasty Spillers, MD;  Location: [facility] ENDOSCOPY;  Service: Endoscopy;  Laterality: N/A;  SPANISH INTERPRETER   ESOPHAGOGASTRODUODENOSCOPY (EGD) WITH PROPOFOL N/A 07/20/2020   Procedure: ESOPHAGOGASTRODUODENOSCOPY (EGD) WITH PROPOFOL;  Surgeon: Pasty Spillers, MD;  Location: [facility] ENDOSCOPY;  Service: Endoscopy;  Laterality: N/A;   HERNIA REPAIR  2014   hysterectemy     melonoma removal on R medial arch of foot  2008   TOTAL ABDOMINAL HYSTERECTOMY W/ BILATERAL SALPINGOOPHORECTOMY  2014   TOTAL ABDOMINAL HYSTERECTOMY W/ BILATERAL SALPINGOOPHORECTOMY     UMBILICAL HERNIA REPAIR     occurred with hysterectemy    Medications:  Current Outpatient Medications on File Prior to Visit  Medication Sig   cyclobenzaprine (FLEXERIL) 10 MG tablet Take 1 tablet (10 mg total) by mouth at bedtime as needed for muscle spasms.   diphenhydrAMINE (BENADRYL ALLERGY) 25 mg capsule Take 1 capsule (25 mg total) by mouth every 6 (six) hours as needed.   triamcinolone ointment (KENALOG) 0.5 % Apply 1 Application topically 2 (two) times daily.  valACYclovir (VALTREX) 500 MG tablet Take 1 tablet (500 mg total) by mouth 2 (two) times daily.   [DISCONTINUED] albuterol (VENTOLIN HFA) 108 (90 Base) MCG/ACT inhaler Inhale 2 puffs into the lungs every 6 (six) hours as needed for wheezing or shortness of breath. (Patient not taking: Reported on 07/03/2020)   No current facility-administered medications on file prior to visit.    Allergies:  Allergies  Allergen Reactions   Codeine Other (See Comments)   Pollen Extract     Social History:  Social History   Socioeconomic History   Marital status: Married    Spouse name: Not on file   Number of children: Not on file   Years of education: Not on file   Highest education level: Not on file  Occupational History   Not on file  Tobacco Use   Smoking status: Never   Smokeless tobacco: Never  Vaping Use   Vaping status: Never Used  Substance and  Sexual Activity   Alcohol use: No   Drug use: No   Sexual activity: Yes    Birth control/protection: Surgical  Other Topics Concern   Not on file  Social History Narrative   Not on file   Social Determinants of Health   Financial Resource Strain: Not on file  Food Insecurity: Not on file  Transportation Needs: Not on file  Physical Activity: Not on file  Stress: Not on file  Social Connections: Not on file  Intimate Partner Violence: Not on file   Social History   Tobacco Use  Smoking Status Never  Smokeless Tobacco Never   Social History   Substance and Sexual Activity  Alcohol Use No    Family History:  Family History  Problem Relation Age of Onset   Stroke Father    Asthma Daughter    Urticaria Son    Food Allergy Son        red dye, dairy   Allergic rhinitis Neg Hx    Angioedema Neg Hx    Eczema Neg Hx     Past medical history, surgical history, medications, allergies, family history and social history reviewed with patient today and changes made to appropriate areas of the chart.   Review of Systems  Constitutional:  Positive for diaphoresis. Negative for chills, fever, malaise/fatigue and weight loss.  HENT: Negative.    Eyes: Negative.   Respiratory:  Positive for cough and wheezing. Negative for hemoptysis, sputum production and shortness of breath.   Cardiovascular:  Positive for chest pain (sharp short chest pains occasionally) and leg swelling. Negative for palpitations, orthopnea, claudication and PND.  Gastrointestinal:  Positive for heartburn. Negative for abdominal pain, blood in stool, constipation, diarrhea, melena, nausea and vomiting.  Genitourinary: Negative.   Musculoskeletal: Negative.   Skin: Negative.   Neurological:  Positive for tingling (her arms at night). Negative for dizziness, tremors, sensory change, speech change, focal weakness, seizures, loss of consciousness, weakness and headaches.  Endo/Heme/Allergies: Negative.    Psychiatric/Behavioral: Negative.     All other ROS negative except what is listed above and in the HPI.      Objective:    BP 108/67   Pulse 79   Ht 5\' 4"  (1.626 m)   Wt 160 lb 9.6 oz (72.8 kg)   SpO2 98%   BMI 27.57 kg/m   Wt Readings from Last 3 Encounters:  01/12/23 160 lb 9.6 oz (72.8 kg)  01/01/23 160 lb 6.4 oz (72.8 kg)  12/18/22 160 lb 3.2 oz (72.7  kg)    Physical Exam Vitals and nursing note reviewed.  Constitutional:      General: She is not in acute distress.    Appearance: Normal appearance. She is not ill-appearing, toxic-appearing or diaphoretic.  HENT:     Head: Normocephalic and atraumatic.     Right Ear: Tympanic membrane, ear canal and external ear normal. There is no impacted cerumen.     Left Ear: Tympanic membrane, ear canal and external ear normal. There is no impacted cerumen.     Nose: Nose normal. No congestion or rhinorrhea.     Mouth/Throat:     Mouth: Mucous membranes are moist.     Pharynx: Oropharynx is clear. No oropharyngeal exudate or posterior oropharyngeal erythema.  Eyes:     General: No scleral icterus.       Right eye: No discharge.        Left eye: No discharge.     Extraocular Movements: Extraocular movements intact.     Conjunctiva/sclera: Conjunctivae normal.     Pupils: Pupils are equal, round, and reactive to light.  Neck:     Vascular: No carotid bruit.  Cardiovascular:     Rate and Rhythm: Normal rate and regular rhythm.     Pulses: Normal pulses.     Heart sounds: No murmur heard.    No friction rub. No gallop.  Pulmonary:     Effort: Pulmonary effort is normal. No respiratory distress.     Breath sounds: Normal breath sounds. No stridor. No wheezing, rhonchi or rales.  Chest:     Chest wall: No tenderness.  Abdominal:     General: Abdomen is flat. Bowel sounds are normal. There is no distension.     Palpations: Abdomen is soft. There is no mass.     Tenderness: There is no abdominal tenderness. There is no right  CVA tenderness, left CVA tenderness, guarding or rebound.     Hernia: No hernia is present.  Genitourinary:    Comments: Breast and pelvic exams deferred with shared decision making Musculoskeletal:        General: No swelling, tenderness, deformity or signs of injury.     Cervical back: Normal range of motion and neck supple. No rigidity. No muscular tenderness.     Right lower leg: No edema.     Left lower leg: No edema.  Lymphadenopathy:     Cervical: No cervical adenopathy.  Skin:    General: Skin is warm and dry.     Capillary Refill: Capillary refill takes less than 2 seconds.     Coloration: Skin is not jaundiced or pale.     Findings: No bruising, erythema, lesion or rash.  Neurological:     General: No focal deficit present.     Mental Status: She is alert and oriented to person, place, and time. Mental status is at baseline.     Cranial Nerves: No cranial nerve deficit.     Sensory: No sensory deficit.     Motor: No weakness.     Coordination: Coordination normal.     Gait: Gait normal.     Deep Tendon Reflexes: Reflexes normal.  Psychiatric:        Mood and Affect: Mood normal.        Behavior: Behavior normal.        Thought Content: Thought content normal.        Judgment: Judgment normal.     Results for orders placed or performed in visit on 06/30/22  Novel Coronavirus, NAA ([facility])   Specimen: Nasopharyngeal(NP) swabs in vial transport medium  Result Value Ref Range   SARS-CoV-2, NAA Detected (A) Not Detected  Rapid Strep screen([facility])   Specimen: Other   Other  Result Value Ref Range   Strep Gp A Ag, IA W/Reflex Negative Negative  Culture, Group A Strep   Other  Result Value Ref Range   Strep A Culture Negative   Veritor Flu A/B Waived  Result Value Ref Range   Influenza A Negative Negative   Influenza B Negative Negative      Assessment & Plan:   Problem List Items Addressed This Visit       Cardiovascular and Mediastinum    Migraine without aura and with status migrainosus, not intractable    Under good control on current regimen. Continue current regimen. Continue to monitor. Call with any concerns. Refills given.        Relevant Medications   SUMAtriptan (IMITREX) 25 MG tablet   Rimegepant Sulfate (NURTEC) 75 MG TBDP   nortriptyline (PAMELOR) 25 MG capsule   Other Visit Diagnoses     Routine general medical examination at a health care facility    -  Primary   Vaccines up to date. Screening labs checked today. Pap N/A. Mammo and colonoscopy up to date. Continue diet and exercise. Call with any concerns.   Relevant Orders   CBC with Differential/Platelet   Comprehensive metabolic panel   Lipid Panel w/o Chol/HDL Ratio   TSH   Gastroesophageal reflux disease without esophagitis       Under good control on current regimen. Continue current regimen. Continue to monitor. Call with any concerns. Refills given. Labs drawn today.   Relevant Medications   omeprazole (PRILOSEC) 40 MG capsule   Encounter for screening mammogram for malignant neoplasm of breast       Mammo ordered today.   Relevant Orders   MM 3D SCREENING MAMMOGRAM BILATERAL BREAST        Follow up plan: Return in about 6 months (around 07/13/2023).   LABORATORY TESTING:  - Pap smear: not applicable  IMMUNIZATIONS:   - Tdap: Tetanus vaccination status reviewed: last tetanus booster within 10 years. - Influenza: Up to date - Pneumovax: Not applicable - Prevnar: Not applicable - COVID: Refused - HPV: Not applicable - Shingrix vaccine: Up to date  SCREENING: -Mammogram: Ordered today  - Colonoscopy: Up to date    PATIENT COUNSELING:   Advised to take 1 mg of folate supplement per day if capable of pregnancy.   Sexuality: Discussed sexually transmitted diseases, partner selection, use of condoms, avoidance of unintended pregnancy  and contraceptive alternatives.   Advised to avoid cigarette smoking.  I discussed with the  patient that most people either abstain from alcohol or drink within safe limits (<=14/week and <=4 drinks/occasion for males, <=7/weeks and <= 3 drinks/occasion for females) and that the risk for alcohol disorders and other health effects rises proportionally with the number of drinks per week and how often a drinker exceeds daily limits.  Discussed cessation/primary prevention of drug use and availability of treatment for abuse.   Diet: Encouraged to adjust caloric intake to maintain  or achieve ideal body weight, to reduce intake of dietary saturated fat and total fat, to limit sodium intake by avoiding high sodium foods and not adding table salt, and to maintain adequate dietary potassium and calcium preferably from fresh fruits, vegetables, and low-fat dairy products.    stressed the importance of regular  exercise  Injury prevention: Discussed safety belts, safety helmets, smoke detector, smoking near bedding or upholstery.   Dental health: Discussed importance of regular tooth brushing, flossing, and dental visits.    NEXT PREVENTATIVE PHYSICAL DUE IN 1 YEAR. Return in about 6 months (around 07/13/2023).

## 2023-01-13 ENCOUNTER — Other Ambulatory Visit: Payer: Self-pay

## 2023-01-13 LAB — COMPREHENSIVE METABOLIC PANEL
ALT: 19 [IU]/L (ref 0–32)
AST: 18 [IU]/L (ref 0–40)
Albumin: 3.8 g/dL (ref 3.8–4.9)
Alkaline Phosphatase: 60 [IU]/L (ref 44–121)
BUN/Creatinine Ratio: 18 (ref 9–23)
BUN: 12 mg/dL (ref 6–24)
Bilirubin Total: 0.4 mg/dL (ref 0.0–1.2)
CO2: 25 mmol/L (ref 20–29)
Calcium: 9 mg/dL (ref 8.7–10.2)
Chloride: 106 mmol/L (ref 96–106)
Creatinine, Ser: 0.67 mg/dL (ref 0.57–1.00)
Globulin, Total: 2.6 g/dL (ref 1.5–4.5)
Glucose: 103 mg/dL — ABNORMAL HIGH (ref 70–99)
Potassium: 4.1 mmol/L (ref 3.5–5.2)
Sodium: 142 mmol/L (ref 134–144)
Total Protein: 6.4 g/dL (ref 6.0–8.5)
eGFR: 105 mL/min/{1.73_m2} (ref >59)

## 2023-01-13 LAB — CBC WITH DIFFERENTIAL/PLATELET
Basophils Absolute: 0 10*3/uL (ref 0.0–0.2)
Basos: 0 %
EOS (ABSOLUTE): 0.1 10*3/uL (ref 0.0–0.4)
Eos: 2 %
Hematocrit: 36.8 % (ref 34.0–46.6)
Hemoglobin: 12.1 g/dL (ref 11.1–15.9)
Immature Grans (Abs): 0 10*3/uL (ref 0.0–0.1)
Immature Granulocytes: 0 %
Lymphocytes Absolute: 1.8 10*3/uL (ref 0.7–3.1)
Lymphs: 38 %
MCH: 29.4 pg (ref 26.6–33.0)
MCHC: 32.9 g/dL (ref 31.5–35.7)
MCV: 89 fL (ref 79–97)
Monocytes Absolute: 0.3 10*3/uL (ref 0.1–0.9)
Monocytes: 5 %
Neutrophils Absolute: 2.5 10*3/uL (ref 1.4–7.0)
Neutrophils: 55 %
Platelets: 198 10*3/uL (ref 150–450)
RBC: 4.12 x10E6/uL (ref 3.77–5.28)
RDW: 12.3 % (ref 11.7–15.4)
WBC: 4.7 10*3/uL (ref 3.4–10.8)

## 2023-01-13 LAB — LIPID PANEL W/O CHOL/HDL RATIO
Cholesterol, Total: 185 mg/dL (ref 100–199)
HDL: 49 mg/dL (ref >39)
LDL Chol Calc (NIH): 103 mg/dL — ABNORMAL HIGH (ref 0–99)
Triglycerides: 193 mg/dL — ABNORMAL HIGH (ref 0–149)
VLDL Cholesterol Cal: 33 mg/dL (ref 5–40)

## 2023-01-13 LAB — TSH: TSH: 2.53 u[IU]/mL (ref 0.450–4.500)

## 2023-01-21 ENCOUNTER — Encounter: Payer: Self-pay | Admitting: Dermatology

## 2023-01-21 ENCOUNTER — Ambulatory Visit (INDEPENDENT_AMBULATORY_CARE_PROVIDER_SITE_OTHER): Payer: Self-pay | Admitting: Dermatology

## 2023-01-21 DIAGNOSIS — I781 Nevus, non-neoplastic: Secondary | ICD-10-CM

## 2023-01-21 DIAGNOSIS — I8393 Asymptomatic varicose veins of bilateral lower extremities: Secondary | ICD-10-CM

## 2023-01-21 NOTE — Progress Notes (Signed)
   Follow-Up Visit   Subjective  Karen Frappier Trostel V. is a 52 y.o. female who presents for the following: Sclerotherapy   The following portions of the chart were reviewed this encounter and updated as appropriate: medications, allergies, medical history  Review of Systems:  No other skin or systemic complaints except as noted in HPI or Assessment and Plan.  Objective  Well appearing patient in no apparent distress; mood and affect are within normal limits.  A focused examination was performed of the following areas: Legs   Relevant exam findings are noted in the Assessment and Plan.                   Assessment & Plan   Spider veins of both lower extremities legs  Asclera 1% NDC 13244-010-27  Lot # [id number]  Exp: 01/2024   The patient presents for desired sclerotherapy for treatment of small to medium purple/red/blue varicosities of the legs.  Procedure: The patient was counseled and understands about the effects, side effects and potential risks and complications of the sclerotherapy procedure. The patient was given the opportunity to ask questions. Asclera (polidocanol) 1% (total 2cc) was injected into the varices. In order to ensure correct placement of the catheter in the vein, I drew back slightly to give moderate blood show in the syringe. If there was any evidence or suspicion of extravasation of sclerosant, the area was immediately diluted with a large volume of 0.9% saline. A pressure dressing was applied immediately to the injected sites. The patient tolerated the procedure well without complication. The patient was instructed in post-operative compression stocking use. The patient understands to call or return immediately if any problems noted.  Botox 28 units. Frown complex 20. DAO's 4 each side. Quote  $364  Return in 6 months (on 07/22/2023) for recheck legs, S/P Sclerotherapy .  I, Lawson Radar, CMA, am acting as scribe for Armida Sans,  MD.   Documentation: I have reviewed the above documentation for accuracy and completeness, and I agree with the above.  Armida Sans, MD

## 2023-01-21 NOTE — Patient Instructions (Addendum)
BEFORE YOUR APPOINTMENT FOR SCLEROTHERAPY  1. When you telephone for your appointment for the sclerotherapy procedure, please let the receptionist know that you are scheduling for the fifteen (15) minute sclerotherapy procedure not just a regular visit.  2. On the day of the procedure, please cleanse and dry the areas, but do not use any moisturizers or other products on the area(s) to be treated.  3. Bring a pair of comfortable shorts to wear during the procedure.  4. Be sure to bring your recommended graduated compression stockings with you to the office. You will be wearing them home when your visit is over. These compression hose can be purchased at most medical supply stores.  After Your Sclerotherapy Procedure  1. Please wear the graduated compression stockings for 24 hours immediately following the completion of the sclerotherapy procedure.  2. We recommend that you avoid vigorous activity as much as possible for the first twenty-four (24) hours. You can do your "normal" routine, but avoid an above normal amount of time on your feet. Elevating the legs when sitting and avoidance of vigorous leg movements or exercise in the first few days after treatment may improve your results.  3. You may remove the compression dressings (cotton balls) and tape the next morning.  4. Please continue wearing the compression stockings during waking hours for the two (2) weeks following sclerotherapy.  5. If you have any blisters, sores or ulcers or other problems following your procedure please call or return to the office immediately.     THE PROCEDURE FEE IS $350.00 PER FIFTEEN (15) MINUTE SESSION. WE REQUIRE THAT THIS PROCEDURE BE PAID FOR IN FULL ON OR BEFORE THE DATE THAT IT IS PERFORMED. WE WILL GIVE YOU A RECEIPT THAT YOU CAN FILE WITH YOUR INSURANCE COMPANY. WE GENERALLY DO NOT FILE THIS PROCEDURE WITH ANY INSURANCE COMPANY EXCEPT UNDER CERTAIN CIRCUMSTANCES WHERE PRIOR AUTHORIZATION HAS BEEN  CONFIRMED. THIS PROCEDURE IS GENERALLY CONSIDERED TO BE A COSMETIC PROCEDURE BY INSURANCE COMPANIES.  Botox 28 units. Frown complex 20. DAO's 4 each side. Quote  $364  Due to recent changes in healthcare laws, you may see results of your pathology and/or laboratory studies on MyChart before the doctors have had a chance to review them. We understand that in some cases there may be results that are confusing or concerning to you. Please understand that not all results are received at the same time and often the doctors may need to interpret multiple results in order to provide you with the best plan of care or course of treatment. Therefore, we ask that you please give [place] 2 business days to thoroughly review all your results before contacting the office for clarification. Should we see a critical lab result, you will be contacted sooner.   If You Need Anything After Your Visit  If you have any questions or concerns for your doctor, please call our main line at [phone] and press option 4 to reach your doctor's medical assistant. If no one answers, please leave a voicemail as directed and we will return your call as soon as possible. Messages left after 4 pm will be answered the following business day.   You may also send [place] a message via MyChart. We typically respond to MyChart messages within 1-2 business days.  For prescription refills, please ask your pharmacy to contact our office. Our fax number is [phone].  If you have an urgent issue when the clinic is closed that cannot wait until the next business  day, you can page your doctor at the number below.    Please note that while we do our best to be available for urgent issues outside of office hours, we are not available 24/7.   If you have an urgent issue and are unable to reach [place], you may choose to seek medical care at your doctor's office, retail clinic, urgent care center, or emergency room.  If you have a medical emergency,  please immediately call 911 or go to the emergency department.  Pager Numbers  - Dr. Gwen Pounds: [phone]  - Dr. Roseanne Reno: [phone]  - Dr. Katrinka Blazing: [phone]   In the event of inclement weather, please call our main line at [phone] for an update on the status of any delays or closures.  Dermatology Medication Tips: Please keep the boxes that topical medications come in in order to help keep track of the instructions about where and how to use these. Pharmacies typically print the medication instructions only on the boxes and not directly on the medication tubes.   If your medication is too expensive, please contact our office at [phone] option 4 or send [place] a message through MyChart.   We are unable to tell what your co-pay for medications will be in advance as this is different depending on your insurance coverage. However, we may be able to find a substitute medication at lower cost or fill out paperwork to get insurance to cover a needed medication.   If a prior authorization is required to get your medication covered by your insurance company, please allow [place] 1-2 business days to complete this process.  Drug prices often vary depending on where the prescription is filled and some pharmacies may offer cheaper prices.  The website www.goodrx.com contains coupons for medications through different pharmacies. The prices here do not account for what the cost may be with help from insurance (it may be cheaper with your insurance), but the website can give you the price if you did not use any insurance.  - You can print the associated coupon and take it with your prescription to the pharmacy.  - You may also stop by our office during regular business hours and pick up a GoodRx coupon card.  - If you need your prescription sent electronically to a different pharmacy, notify our office through [facility] or by phone at [phone] option 4.     Si Usted Necesita  Algo Despus de Su Visita  Tambin puede enviarnos un mensaje a travs de Clinical cytogeneticist. Por lo general respondemos a los mensajes de MyChart en el transcurso de 1 a 2 das hbiles.  Para renovar recetas, por favor pida a su farmacia que se ponga en contacto con nuestra oficina. Annie Sable de fax es [place] [phone].  Si tiene un asunto urgente cuando la clnica est cerrada y que no puede esperar hasta el siguiente da hbil, puede llamar/localizar a su doctor(a) al nmero que aparece a continuacin.   Por favor, tenga en cuenta que aunque hacemos todo lo posible para estar disponibles para asuntos urgentes fuera del horario de [place], no estamos disponibles las 24 horas del da, los 7 [address]  [address] de la [place].   Si tiene un problema urgente y no puede comunicarse con nosotros, puede optar por buscar atencin mdica  en el consultorio de su doctor(a), en una clnica privada, en un centro de atencin urgente o en una sala de emergencias.  Si tiene una emergencia mdica, por favor llame inmediatamente al 911 o  vaya a la sala de emergencias.  Nmeros de bper  - Dr. Gwen Pounds: [phone]  - Dra. Roseanne Reno: [phone]  - Dr. Katrinka Blazing: [phone]   En caso de inclemencias del tiempo, por favor llame a Lacy Duverney principal al [phone] para una actualizacin sobre el [place] de cualquier retraso o cierre.  Consejos para la medicacin en dermatologa: Por favor, guarde las cajas en las que vienen los medicamentos de uso tpico para ayudarle a seguir las instrucciones sobre dnde y cmo usarlos. Las farmacias generalmente imprimen las instrucciones del medicamento slo en las cajas y no directamente en los tubos del [place].   Si su medicamento es muy caro, por favor, pngase en contacto con Rolm Gala llamando al [phone] y presione la opcin 4 o envenos un mensaje a travs de Clinical cytogeneticist.   No podemos decirle cul ser su copago por los medicamentos por adelantado ya que esto es  diferente dependiendo de la cobertura de su seguro. Sin embargo, es posible que podamos encontrar un medicamento sustituto a Audiological scientist un formulario para que el seguro cubra el medicamento que se considera necesario.   Si se requiere una autorizacin previa para que su compaa de seguros [place] su medicamento, por favor permtanos de 1 a 2 das hbiles para completar [address]  Los precios de los medicamentos varan con frecuencia dependiendo del Environmental consultant de dnde se surte la receta y alguna farmacias pueden ofrecer precios ms baratos.  El sitio web www.goodrx.com tiene cupones para medicamentos de Health and safety inspector. Los precios aqu no tienen en cuenta lo que podra costar con la ayuda del seguro (puede ser ms barato con su seguro), pero el sitio web puede darle el precio si no utiliz Tourist information centre manager.  - Puede imprimir el cupn correspondiente y llevarlo con su receta a la farmacia.  - Tambin puede pasar por nuestra oficina durante el horario de atencin regular y Education officer, museum una tarjeta de cupones de GoodRx.  - Si necesita que su receta se enve electrnicamente a una farmacia diferente, informe a nuestra oficina a travs de MyChart de [place] o por telfono llamando al [phone] y presione la opcin 4.

## 2023-01-29 ENCOUNTER — Ambulatory Visit: Payer: Commercial Managed Care - PPO | Admitting: Family Medicine

## 2023-02-03 ENCOUNTER — Ambulatory Visit: Payer: Commercial Managed Care - PPO | Attending: Family Medicine

## 2023-02-03 DIAGNOSIS — S134XXD Sprain of ligaments of cervical spine, subsequent encounter: Secondary | ICD-10-CM | POA: Insufficient documentation

## 2023-02-03 DIAGNOSIS — M4722 Other spondylosis with radiculopathy, cervical region: Secondary | ICD-10-CM | POA: Insufficient documentation

## 2023-02-03 DIAGNOSIS — M542 Cervicalgia: Secondary | ICD-10-CM | POA: Diagnosis not present

## 2023-02-03 DIAGNOSIS — M6281 Muscle weakness (generalized): Secondary | ICD-10-CM | POA: Diagnosis not present

## 2023-02-03 DIAGNOSIS — R42 Dizziness and giddiness: Secondary | ICD-10-CM | POA: Diagnosis not present
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
## 2023-02-05 ENCOUNTER — Ambulatory Visit: Payer: Commercial Managed Care - PPO

## 2023-02-05 DIAGNOSIS — R42 Dizziness and giddiness: Secondary | ICD-10-CM | POA: Diagnosis not present

## 2023-02-05 DIAGNOSIS — M542 Cervicalgia: Secondary | ICD-10-CM | POA: Diagnosis not present

## 2023-02-05 DIAGNOSIS — S134XXD Sprain of ligaments of cervical spine, subsequent encounter: Secondary | ICD-10-CM | POA: Diagnosis not present

## 2023-02-05 DIAGNOSIS — M6281 Muscle weakness (generalized): Secondary | ICD-10-CM

## 2023-02-05 DIAGNOSIS — M4722 Other spondylosis with radiculopathy, cervical region: Secondary | ICD-10-CM | POA: Diagnosis not present
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
## 2023-02-11 ENCOUNTER — Ambulatory Visit: Payer: Commercial Managed Care - PPO | Admitting: Physical Therapy

## 2023-02-12 ENCOUNTER — Ambulatory Visit: Payer: Commercial Managed Care - PPO | Admitting: Family Medicine

## 2023-02-12 ENCOUNTER — Ambulatory Visit: Payer: Commercial Managed Care - PPO | Attending: Family Medicine

## 2023-02-12 DIAGNOSIS — M542 Cervicalgia: Secondary | ICD-10-CM | POA: Diagnosis not present

## 2023-02-12 DIAGNOSIS — M549 Dorsalgia, unspecified: Secondary | ICD-10-CM | POA: Insufficient documentation

## 2023-02-12 DIAGNOSIS — R42 Dizziness and giddiness: Secondary | ICD-10-CM | POA: Diagnosis not present

## 2023-02-12 DIAGNOSIS — R278 Other lack of coordination: Secondary | ICD-10-CM | POA: Diagnosis not present

## 2023-02-12 DIAGNOSIS — M6281 Muscle weakness (generalized): Secondary | ICD-10-CM | POA: Diagnosis not present
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
## 2023-02-17 ENCOUNTER — Ambulatory Visit: Payer: Commercial Managed Care - PPO

## 2023-02-17 DIAGNOSIS — M542 Cervicalgia: Secondary | ICD-10-CM | POA: Diagnosis not present

## 2023-02-17 DIAGNOSIS — M6281 Muscle weakness (generalized): Secondary | ICD-10-CM

## 2023-02-17 DIAGNOSIS — M549 Dorsalgia, unspecified: Secondary | ICD-10-CM | POA: Diagnosis not present

## 2023-02-17 DIAGNOSIS — R42 Dizziness and giddiness: Secondary | ICD-10-CM | POA: Diagnosis not present

## 2023-02-17 DIAGNOSIS — R278 Other lack of coordination: Secondary | ICD-10-CM | POA: Diagnosis not present
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
## 2023-02-19 ENCOUNTER — Telehealth: Payer: Self-pay

## 2023-02-19 ENCOUNTER — Ambulatory Visit: Payer: Commercial Managed Care - PPO

## 2023-02-19 NOTE — Telephone Encounter (Signed)
Patient Name: Karen Walker. MRN: [id number] DOB:1971/02/26, 52 y.o., female Today's Date: 02/19/2023  Phone call to patient as she did not show for her scheduled time today at 10:15. Left VM with details of missed visit and informed of date/time of next scheduled visit.    Lenda Kelp, PT 02/19/2023, 2:14 PM

## 2023-02-24 ENCOUNTER — Ambulatory Visit: Payer: Commercial Managed Care - PPO

## 2023-02-24 DIAGNOSIS — M6281 Muscle weakness (generalized): Secondary | ICD-10-CM | POA: Diagnosis not present

## 2023-02-24 DIAGNOSIS — M549 Dorsalgia, unspecified: Secondary | ICD-10-CM | POA: Diagnosis not present

## 2023-02-24 DIAGNOSIS — M542 Cervicalgia: Secondary | ICD-10-CM

## 2023-02-24 DIAGNOSIS — R42 Dizziness and giddiness: Secondary | ICD-10-CM | POA: Diagnosis not present

## 2023-02-24 DIAGNOSIS — R278 Other lack of coordination: Secondary | ICD-10-CM | POA: Diagnosis not present
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
## 2023-02-26 ENCOUNTER — Ambulatory Visit: Payer: Commercial Managed Care - PPO

## 2023-02-26 DIAGNOSIS — M6281 Muscle weakness (generalized): Secondary | ICD-10-CM | POA: Diagnosis not present

## 2023-02-26 DIAGNOSIS — R42 Dizziness and giddiness: Secondary | ICD-10-CM | POA: Diagnosis not present

## 2023-02-26 DIAGNOSIS — R278 Other lack of coordination: Secondary | ICD-10-CM

## 2023-02-26 DIAGNOSIS — M542 Cervicalgia: Secondary | ICD-10-CM

## 2023-02-26 DIAGNOSIS — M549 Dorsalgia, unspecified: Secondary | ICD-10-CM | POA: Diagnosis not present
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
## 2023-03-03 ENCOUNTER — Ambulatory Visit: Payer: Commercial Managed Care - PPO

## 2023-03-03 DIAGNOSIS — M549 Dorsalgia, unspecified: Secondary | ICD-10-CM

## 2023-03-03 DIAGNOSIS — R278 Other lack of coordination: Secondary | ICD-10-CM

## 2023-03-03 DIAGNOSIS — M6281 Muscle weakness (generalized): Secondary | ICD-10-CM | POA: Diagnosis not present

## 2023-03-03 DIAGNOSIS — M542 Cervicalgia: Secondary | ICD-10-CM

## 2023-03-03 DIAGNOSIS — R42 Dizziness and giddiness: Secondary | ICD-10-CM
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
## 2023-03-11 ENCOUNTER — Ambulatory Visit: Payer: Commercial Managed Care - PPO | Attending: Family Medicine

## 2023-03-11 DIAGNOSIS — M542 Cervicalgia: Secondary | ICD-10-CM | POA: Diagnosis not present

## 2023-03-11 DIAGNOSIS — R278 Other lack of coordination: Secondary | ICD-10-CM

## 2023-03-11 DIAGNOSIS — M6281 Muscle weakness (generalized): Secondary | ICD-10-CM | POA: Diagnosis not present

## 2023-03-11 DIAGNOSIS — M549 Dorsalgia, unspecified: Secondary | ICD-10-CM

## 2023-03-11 DIAGNOSIS — R2689 Other abnormalities of gait and mobility: Secondary | ICD-10-CM | POA: Diagnosis not present

## 2023-03-11 DIAGNOSIS — R42 Dizziness and giddiness: Secondary | ICD-10-CM
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
## 2023-03-17 ENCOUNTER — Ambulatory Visit: Payer: Commercial Managed Care - PPO

## 2023-03-17 DIAGNOSIS — M6281 Muscle weakness (generalized): Secondary | ICD-10-CM

## 2023-03-17 DIAGNOSIS — R2689 Other abnormalities of gait and mobility: Secondary | ICD-10-CM | POA: Diagnosis not present

## 2023-03-17 DIAGNOSIS — R42 Dizziness and giddiness: Secondary | ICD-10-CM | POA: Diagnosis not present

## 2023-03-17 DIAGNOSIS — M542 Cervicalgia: Secondary | ICD-10-CM

## 2023-03-17 DIAGNOSIS — R278 Other lack of coordination: Secondary | ICD-10-CM

## 2023-03-17 DIAGNOSIS — M549 Dorsalgia, unspecified: Secondary | ICD-10-CM | POA: Diagnosis not present

## 2023-03-19 ENCOUNTER — Ambulatory Visit: Payer: Commercial Managed Care - PPO

## 2023-03-19 DIAGNOSIS — M6281 Muscle weakness (generalized): Secondary | ICD-10-CM | POA: Diagnosis not present

## 2023-03-19 DIAGNOSIS — R42 Dizziness and giddiness: Secondary | ICD-10-CM

## 2023-03-19 DIAGNOSIS — R278 Other lack of coordination: Secondary | ICD-10-CM | POA: Diagnosis not present

## 2023-03-19 DIAGNOSIS — R2689 Other abnormalities of gait and mobility: Secondary | ICD-10-CM | POA: Diagnosis not present

## 2023-03-19 DIAGNOSIS — M549 Dorsalgia, unspecified: Secondary | ICD-10-CM | POA: Diagnosis not present

## 2023-03-19 DIAGNOSIS — M542 Cervicalgia: Secondary | ICD-10-CM | POA: Diagnosis not present
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
## 2023-03-24 ENCOUNTER — Ambulatory Visit: Payer: Commercial Managed Care - PPO

## 2023-03-24 DIAGNOSIS — R42 Dizziness and giddiness: Secondary | ICD-10-CM

## 2023-03-24 DIAGNOSIS — M549 Dorsalgia, unspecified: Secondary | ICD-10-CM | POA: Diagnosis not present

## 2023-03-24 DIAGNOSIS — R278 Other lack of coordination: Secondary | ICD-10-CM

## 2023-03-24 DIAGNOSIS — M542 Cervicalgia: Secondary | ICD-10-CM | POA: Diagnosis not present

## 2023-03-24 DIAGNOSIS — M6281 Muscle weakness (generalized): Secondary | ICD-10-CM

## 2023-03-24 DIAGNOSIS — R2689 Other abnormalities of gait and mobility: Secondary | ICD-10-CM | POA: Diagnosis not present
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
## 2023-03-26 ENCOUNTER — Ambulatory Visit: Payer: Commercial Managed Care - PPO

## 2023-03-26 DIAGNOSIS — R278 Other lack of coordination: Secondary | ICD-10-CM | POA: Diagnosis not present

## 2023-03-26 DIAGNOSIS — R2689 Other abnormalities of gait and mobility: Secondary | ICD-10-CM | POA: Diagnosis not present

## 2023-03-26 DIAGNOSIS — M549 Dorsalgia, unspecified: Secondary | ICD-10-CM

## 2023-03-26 DIAGNOSIS — M6281 Muscle weakness (generalized): Secondary | ICD-10-CM

## 2023-03-26 DIAGNOSIS — M542 Cervicalgia: Secondary | ICD-10-CM | POA: Diagnosis not present

## 2023-03-26 DIAGNOSIS — R42 Dizziness and giddiness: Secondary | ICD-10-CM | POA: Diagnosis not present
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
## 2023-03-31 ENCOUNTER — Ambulatory Visit: Payer: Commercial Managed Care - PPO

## 2023-03-31 DIAGNOSIS — M6281 Muscle weakness (generalized): Secondary | ICD-10-CM

## 2023-03-31 DIAGNOSIS — M549 Dorsalgia, unspecified: Secondary | ICD-10-CM | POA: Diagnosis not present

## 2023-03-31 DIAGNOSIS — M542 Cervicalgia: Secondary | ICD-10-CM

## 2023-03-31 DIAGNOSIS — R42 Dizziness and giddiness: Secondary | ICD-10-CM | POA: Diagnosis not present

## 2023-03-31 DIAGNOSIS — R278 Other lack of coordination: Secondary | ICD-10-CM | POA: Diagnosis not present

## 2023-03-31 DIAGNOSIS — R2689 Other abnormalities of gait and mobility: Secondary | ICD-10-CM | POA: Diagnosis not present

## 2023-04-06 ENCOUNTER — Ambulatory Visit: Payer: Commercial Managed Care - PPO

## 2023-04-06 DIAGNOSIS — M549 Dorsalgia, unspecified: Secondary | ICD-10-CM

## 2023-04-06 DIAGNOSIS — R278 Other lack of coordination: Secondary | ICD-10-CM

## 2023-04-06 DIAGNOSIS — M6281 Muscle weakness (generalized): Secondary | ICD-10-CM | POA: Diagnosis not present

## 2023-04-06 DIAGNOSIS — R42 Dizziness and giddiness: Secondary | ICD-10-CM

## 2023-04-06 DIAGNOSIS — M542 Cervicalgia: Secondary | ICD-10-CM

## 2023-04-06 DIAGNOSIS — R2689 Other abnormalities of gait and mobility: Secondary | ICD-10-CM | POA: Diagnosis not present
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
## 2023-04-07 ENCOUNTER — Other Ambulatory Visit: Payer: Self-pay

## 2023-04-09 ENCOUNTER — Ambulatory Visit: Payer: Commercial Managed Care - PPO | Attending: Family Medicine

## 2023-04-09 ENCOUNTER — Other Ambulatory Visit: Payer: Self-pay

## 2023-04-09 DIAGNOSIS — R2689 Other abnormalities of gait and mobility: Secondary | ICD-10-CM | POA: Insufficient documentation

## 2023-04-09 DIAGNOSIS — M542 Cervicalgia: Secondary | ICD-10-CM

## 2023-04-09 DIAGNOSIS — M549 Dorsalgia, unspecified: Secondary | ICD-10-CM

## 2023-04-09 DIAGNOSIS — M6281 Muscle weakness (generalized): Secondary | ICD-10-CM

## 2023-04-09 DIAGNOSIS — R278 Other lack of coordination: Secondary | ICD-10-CM

## 2023-04-09 DIAGNOSIS — R42 Dizziness and giddiness: Secondary | ICD-10-CM | POA: Diagnosis not present

## 2023-04-09 MED ORDER — MAGNESIUM GLYCINATE 100 MG PO CAPS
4.0000 | ORAL_CAPSULE | Freq: Every evening | ORAL | 3 refills | Status: AC
  Filled 2023-08-10: qty 360, 90d supply, fill #0
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
## 2023-04-13 ENCOUNTER — Ambulatory Visit: Payer: Commercial Managed Care - PPO

## 2023-04-15 ENCOUNTER — Telehealth: Payer: Self-pay | Admitting: Physical Therapy

## 2023-04-15 ENCOUNTER — Ambulatory Visit: Payer: Commercial Managed Care - PPO | Admitting: Physical Therapy

## 2023-04-15 NOTE — Telephone Encounter (Signed)
 PT reached out to patient via phone. Pt reports that daughter had surgery yesterday, and she is caring for her; unable to make it to scheduled PT treatment. Pt plans on attending next scheduled PT treatment on 1/13 at 845AM.   Massie Dollar PT, DPT  Physical Therapist - [place]  [facility]  9:20 AM 04/15/23

## 2023-04-20 ENCOUNTER — Other Ambulatory Visit: Payer: Self-pay

## 2023-04-20 ENCOUNTER — Ambulatory Visit: Payer: Commercial Managed Care - PPO | Admitting: Physical Therapy

## 2023-04-20 DIAGNOSIS — R42 Dizziness and giddiness: Secondary | ICD-10-CM | POA: Diagnosis not present

## 2023-04-20 DIAGNOSIS — R2689 Other abnormalities of gait and mobility: Secondary | ICD-10-CM | POA: Diagnosis not present

## 2023-04-20 DIAGNOSIS — M542 Cervicalgia: Secondary | ICD-10-CM | POA: Diagnosis not present

## 2023-04-20 DIAGNOSIS — M6281 Muscle weakness (generalized): Secondary | ICD-10-CM

## 2023-04-20 DIAGNOSIS — M549 Dorsalgia, unspecified: Secondary | ICD-10-CM | POA: Diagnosis not present

## 2023-04-20 DIAGNOSIS — R278 Other lack of coordination: Secondary | ICD-10-CM | POA: Diagnosis not present
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
## 2023-04-22 ENCOUNTER — Ambulatory Visit: Payer: Commercial Managed Care - PPO | Admitting: Physical Therapy

## 2023-04-22 DIAGNOSIS — R42 Dizziness and giddiness: Secondary | ICD-10-CM

## 2023-04-22 DIAGNOSIS — M542 Cervicalgia: Secondary | ICD-10-CM

## 2023-04-22 DIAGNOSIS — M549 Dorsalgia, unspecified: Secondary | ICD-10-CM | POA: Diagnosis not present

## 2023-04-22 DIAGNOSIS — R278 Other lack of coordination: Secondary | ICD-10-CM | POA: Diagnosis not present

## 2023-04-22 DIAGNOSIS — R2689 Other abnormalities of gait and mobility: Secondary | ICD-10-CM | POA: Diagnosis not present

## 2023-04-22 DIAGNOSIS — M6281 Muscle weakness (generalized): Secondary | ICD-10-CM
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
## 2023-04-27 ENCOUNTER — Ambulatory Visit: Payer: Commercial Managed Care - PPO | Admitting: Physical Therapy

## 2023-04-27 DIAGNOSIS — M549 Dorsalgia, unspecified: Secondary | ICD-10-CM | POA: Diagnosis not present

## 2023-04-27 DIAGNOSIS — R42 Dizziness and giddiness: Secondary | ICD-10-CM | POA: Diagnosis not present

## 2023-04-27 DIAGNOSIS — M542 Cervicalgia: Secondary | ICD-10-CM | POA: Diagnosis not present

## 2023-04-27 DIAGNOSIS — R278 Other lack of coordination: Secondary | ICD-10-CM | POA: Diagnosis not present

## 2023-04-27 DIAGNOSIS — R2689 Other abnormalities of gait and mobility: Secondary | ICD-10-CM | POA: Diagnosis not present

## 2023-04-27 DIAGNOSIS — M6281 Muscle weakness (generalized): Secondary | ICD-10-CM
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
## 2023-04-29 ENCOUNTER — Ambulatory Visit: Payer: Commercial Managed Care - PPO | Admitting: Physical Therapy

## 2023-04-29 DIAGNOSIS — M549 Dorsalgia, unspecified: Secondary | ICD-10-CM | POA: Diagnosis not present

## 2023-04-29 DIAGNOSIS — R2689 Other abnormalities of gait and mobility: Secondary | ICD-10-CM | POA: Diagnosis not present

## 2023-04-29 DIAGNOSIS — M6281 Muscle weakness (generalized): Secondary | ICD-10-CM | POA: Diagnosis not present

## 2023-04-29 DIAGNOSIS — R278 Other lack of coordination: Secondary | ICD-10-CM | POA: Diagnosis not present

## 2023-04-29 DIAGNOSIS — M542 Cervicalgia: Secondary | ICD-10-CM

## 2023-04-29 DIAGNOSIS — R42 Dizziness and giddiness: Secondary | ICD-10-CM | POA: Diagnosis not present
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
## 2023-05-04 ENCOUNTER — Ambulatory Visit: Payer: Commercial Managed Care - PPO | Admitting: Physical Therapy

## 2023-05-04 DIAGNOSIS — R2689 Other abnormalities of gait and mobility: Secondary | ICD-10-CM | POA: Diagnosis not present

## 2023-05-04 DIAGNOSIS — H1013 Acute atopic conjunctivitis, bilateral: Secondary | ICD-10-CM | POA: Diagnosis not present

## 2023-05-04 DIAGNOSIS — R278 Other lack of coordination: Secondary | ICD-10-CM | POA: Diagnosis not present

## 2023-05-04 DIAGNOSIS — R42 Dizziness and giddiness: Secondary | ICD-10-CM | POA: Diagnosis not present

## 2023-05-04 DIAGNOSIS — M542 Cervicalgia: Secondary | ICD-10-CM

## 2023-05-04 DIAGNOSIS — M549 Dorsalgia, unspecified: Secondary | ICD-10-CM | POA: Diagnosis not present

## 2023-05-04 DIAGNOSIS — H2513 Age-related nuclear cataract, bilateral: Secondary | ICD-10-CM | POA: Diagnosis not present

## 2023-05-04 DIAGNOSIS — H524 Presbyopia: Secondary | ICD-10-CM | POA: Diagnosis not present

## 2023-05-04 DIAGNOSIS — M6281 Muscle weakness (generalized): Secondary | ICD-10-CM | POA: Diagnosis not present
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
## 2023-05-06 ENCOUNTER — Ambulatory Visit: Payer: Commercial Managed Care - PPO | Admitting: Physical Therapy

## 2023-05-06 ENCOUNTER — Telehealth: Payer: Self-pay

## 2023-05-06 NOTE — Telephone Encounter (Signed)
Pt did not arrive for scheduled appointment. No telephone call nor message preceeded this absence. Author attempted to contact pt via telephone number listed in chart. Pt reports she had a different day written down for this appointment. Pt made aware of next appointment.   9:18 AM, 05/06/23 Rosamaria Lints, PT, DPT Physical Therapist - [place] [facility]  Outpatient Physical Therapy- Main [facility] [phone]

## 2023-05-11 ENCOUNTER — Ambulatory Visit: Payer: Commercial Managed Care - PPO | Attending: Family Medicine

## 2023-05-11 DIAGNOSIS — M6281 Muscle weakness (generalized): Secondary | ICD-10-CM | POA: Diagnosis not present

## 2023-05-11 DIAGNOSIS — R42 Dizziness and giddiness: Secondary | ICD-10-CM | POA: Insufficient documentation

## 2023-05-11 DIAGNOSIS — M542 Cervicalgia: Secondary | ICD-10-CM | POA: Insufficient documentation

## 2023-05-11 DIAGNOSIS — M549 Dorsalgia, unspecified: Secondary | ICD-10-CM | POA: Insufficient documentation

## 2023-05-11 DIAGNOSIS — R278 Other lack of coordination: Secondary | ICD-10-CM | POA: Diagnosis not present
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
## 2023-05-13 ENCOUNTER — Other Ambulatory Visit: Payer: Self-pay

## 2023-05-13 ENCOUNTER — Ambulatory Visit: Payer: Commercial Managed Care - PPO | Admitting: Physical Therapy

## 2023-05-13 DIAGNOSIS — M549 Dorsalgia, unspecified: Secondary | ICD-10-CM | POA: Diagnosis not present

## 2023-05-13 DIAGNOSIS — M6281 Muscle weakness (generalized): Secondary | ICD-10-CM | POA: Diagnosis not present

## 2023-05-13 DIAGNOSIS — M542 Cervicalgia: Secondary | ICD-10-CM

## 2023-05-13 DIAGNOSIS — R42 Dizziness and giddiness: Secondary | ICD-10-CM | POA: Diagnosis not present

## 2023-05-13 DIAGNOSIS — R278 Other lack of coordination: Secondary | ICD-10-CM | POA: Diagnosis not present
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
## 2023-05-14 ENCOUNTER — Other Ambulatory Visit: Payer: Self-pay

## 2023-05-14 MED ORDER — B-2 100 MG PO TABS
400.0000 mg | ORAL_TABLET | Freq: Every day | ORAL | 3 refills | Status: AC
  Filled 2023-05-14: qty 400, 90d supply, fill #0
  Filled 2023-08-10: qty 400, 90d supply, fill #1
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
## 2023-05-18 ENCOUNTER — Ambulatory Visit: Payer: Commercial Managed Care - PPO

## 2023-05-18 DIAGNOSIS — M549 Dorsalgia, unspecified: Secondary | ICD-10-CM | POA: Diagnosis not present

## 2023-05-18 DIAGNOSIS — R42 Dizziness and giddiness: Secondary | ICD-10-CM | POA: Diagnosis not present

## 2023-05-18 DIAGNOSIS — M6281 Muscle weakness (generalized): Secondary | ICD-10-CM

## 2023-05-18 DIAGNOSIS — M542 Cervicalgia: Secondary | ICD-10-CM | POA: Diagnosis not present

## 2023-05-18 DIAGNOSIS — R278 Other lack of coordination: Secondary | ICD-10-CM | POA: Diagnosis not present

## 2023-05-20 ENCOUNTER — Ambulatory Visit: Payer: Commercial Managed Care - PPO | Admitting: Physical Therapy

## 2023-05-20 DIAGNOSIS — M542 Cervicalgia: Secondary | ICD-10-CM | POA: Diagnosis not present

## 2023-05-20 DIAGNOSIS — M549 Dorsalgia, unspecified: Secondary | ICD-10-CM | POA: Diagnosis not present

## 2023-05-20 DIAGNOSIS — M6281 Muscle weakness (generalized): Secondary | ICD-10-CM | POA: Diagnosis not present

## 2023-05-20 DIAGNOSIS — R42 Dizziness and giddiness: Secondary | ICD-10-CM | POA: Diagnosis not present

## 2023-05-20 DIAGNOSIS — R278 Other lack of coordination: Secondary | ICD-10-CM | POA: Diagnosis not present
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
## 2023-05-25 ENCOUNTER — Ambulatory Visit: Payer: Commercial Managed Care - PPO

## 2023-05-25 DIAGNOSIS — M542 Cervicalgia: Secondary | ICD-10-CM

## 2023-05-25 DIAGNOSIS — M549 Dorsalgia, unspecified: Secondary | ICD-10-CM | POA: Diagnosis not present

## 2023-05-25 DIAGNOSIS — M6281 Muscle weakness (generalized): Secondary | ICD-10-CM | POA: Diagnosis not present

## 2023-05-25 DIAGNOSIS — R42 Dizziness and giddiness: Secondary | ICD-10-CM | POA: Diagnosis not present

## 2023-05-25 DIAGNOSIS — R278 Other lack of coordination: Secondary | ICD-10-CM | POA: Diagnosis not present
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
## 2023-05-26 ENCOUNTER — Encounter: Payer: Self-pay | Admitting: Family Medicine

## 2023-05-26 ENCOUNTER — Ambulatory Visit: Payer: Commercial Managed Care - PPO | Admitting: Family Medicine

## 2023-05-26 VITALS — BP 105/70 | HR 79 | Ht 64.0 in | Wt 168.0 lb

## 2023-05-26 DIAGNOSIS — M4722 Other spondylosis with radiculopathy, cervical region: Secondary | ICD-10-CM

## 2023-05-26 NOTE — Assessment & Plan Note (Signed)
Has been doing PT for 5 months without any benefit. X-rays in September showed cervical DJD and thoracic spondylosis. We will send to PM&R and ger MRI of thorax and cervical. Await results. Treat as needed. Hold on gabapentin for this time due to side effects.
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
## 2023-05-27 ENCOUNTER — Ambulatory Visit: Payer: Commercial Managed Care - PPO
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
## 2023-05-29 ENCOUNTER — Ambulatory Visit
Admission: RE | Admit: 2023-05-29 | Discharge: 2023-05-29 | Disposition: A | Discharge status: Home / Self Care | Payer: Commercial Managed Care - PPO | Source: Ambulatory Visit | Attending: Family Medicine | Admitting: Family Medicine

## 2023-05-29 DIAGNOSIS — M40204 Unspecified kyphosis, thoracic region: Secondary | ICD-10-CM | POA: Diagnosis not present

## 2023-05-29 DIAGNOSIS — M5021 Other cervical disc displacement,  high cervical region: Secondary | ICD-10-CM | POA: Diagnosis not present

## 2023-05-29 DIAGNOSIS — M4802 Spinal stenosis, cervical region: Secondary | ICD-10-CM | POA: Diagnosis not present

## 2023-05-29 DIAGNOSIS — M549 Dorsalgia, unspecified: Secondary | ICD-10-CM | POA: Diagnosis not present

## 2023-05-29 DIAGNOSIS — M5134 Other intervertebral disc degeneration, thoracic region: Secondary | ICD-10-CM | POA: Diagnosis not present

## 2023-05-29 DIAGNOSIS — M47812 Spondylosis without myelopathy or radiculopathy, cervical region: Secondary | ICD-10-CM | POA: Diagnosis not present

## 2023-05-29 DIAGNOSIS — M5124 Other intervertebral disc displacement, thoracic region: Secondary | ICD-10-CM | POA: Diagnosis not present

## 2023-05-29 DIAGNOSIS — M4722 Other spondylosis with radiculopathy, cervical region: Secondary | ICD-10-CM | POA: Diagnosis not present

## 2023-05-29 DIAGNOSIS — M4312 Spondylolisthesis, cervical region: Secondary | ICD-10-CM | POA: Diagnosis not present

## 2023-06-01 ENCOUNTER — Ambulatory Visit: Payer: Commercial Managed Care - PPO | Admitting: Physical Therapy

## 2023-06-03 ENCOUNTER — Encounter: Payer: Self-pay | Admitting: Pediatrics

## 2023-06-03 ENCOUNTER — Encounter: Payer: Commercial Managed Care - PPO | Admitting: Physical Therapy

## 2023-06-03 ENCOUNTER — Other Ambulatory Visit: Payer: Self-pay

## 2023-06-03 ENCOUNTER — Ambulatory Visit: Payer: Commercial Managed Care - PPO | Admitting: Pediatrics

## 2023-06-03 VITALS — BP 100/66 | HR 91 | Temp 98.7°F | Resp 14 | Wt 162.0 lb

## 2023-06-03 DIAGNOSIS — J069 Acute upper respiratory infection, unspecified: Secondary | ICD-10-CM

## 2023-06-03 DIAGNOSIS — Z1330 Encounter for screening examination for mental health and behavioral disorders, unspecified: Secondary | ICD-10-CM | POA: Diagnosis not present

## 2023-06-03 DIAGNOSIS — R6889 Other general symptoms and signs: Secondary | ICD-10-CM

## 2023-06-03 MED ORDER — ONDANSETRON HCL 4 MG PO TABS
4.0000 mg | ORAL_TABLET | Freq: Three times a day (TID) | ORAL | 0 refills | Status: AC | PRN
  Filled 2023-06-03: qty 20, 7d supply, fill #0

## 2023-06-03 NOTE — Progress Notes (Signed)
 Office Visit  BP 100/66 (BP Location: Left Arm, Patient Position: Sitting, Cuff Size: Normal)   Pulse 91   Temp 98.7 F (37.1 C) (Oral)   Resp 14   Wt 162 lb (73.5 kg)   SpO2 98%   BMI 27.81 kg/m    Subjective:    Patient ID: Karen Rolling., female    DOB: 07-26-1970, 53 y.o.   MRN: [id number]  HPI: Karen Walker is a 53 y.o. female  Chief Complaint  Patient presents with   URI    Started Monday with flu like symptoms. Headaches, nausea, upset stomach, eye/face pain, throat and ear pain.     Discussed the use of AI scribe software for clinical note transcription with the patient, who gave verbal consent to proceed.  History of Present Illness   Karen Lan V. "Keziah Avis" is a 53 year old female who presents with congestion, headache, and nausea.  She has been experiencing significant nasal congestion, headache, and ear pain, which have persisted. Additionally, she has a cough and sore throat. Nausea has been present without improvement, occurring both yesterday and upon waking today. No vomiting is reported.  She has a history of bacterial sinus infections, which she mentions in the context of her current symptoms. She has been managing her symptoms at home with DayQuil and Tamiflu.  In her social history, she works at a hospital and notes that many people around her have been sick, suggesting a contagious environment. She worked over the weekend and was off on Monday, with plans to return to work when she feels better.        Relevant past medical, surgical, family and social history reviewed and updated as indicated. Interim medical history since our last visit reviewed. Allergies and medications reviewed and updated.  ROS per HPI unless specifically indicated above     Objective:    BP 100/66 (BP Location: Left Arm, Patient Position: Sitting, Cuff Size: Normal)   Pulse 91   Temp 98.7 F (37.1 C) (Oral)   Resp 14   Wt 162 lb (73.5 kg)   SpO2 98%   BMI  27.81 kg/m   Wt Readings from Last 3 Encounters:  06/03/23 162 lb (73.5 kg)  05/26/23 168 lb (76.2 kg)  01/12/23 160 lb 9.6 oz (72.8 kg)     Physical Exam Constitutional:      Appearance: Normal appearance.  HENT:     Head: Normocephalic and atraumatic.     Nose: Congestion present.     Comments: Minimal bilateral frontal and maxillary sinus tenderness    Mouth/Throat:     Pharynx: Oropharynx is clear. No pharyngeal swelling or oropharyngeal exudate.     Tonsils: No tonsillar exudate.  Eyes:     Pupils: Pupils are equal, round, and reactive to light.  Cardiovascular:     Rate and Rhythm: Normal rate and regular rhythm.     Pulses: Normal pulses.     Heart sounds: Normal heart sounds.  Pulmonary:     Effort: Pulmonary effort is normal.     Breath sounds: Normal breath sounds.  Abdominal:     General: Abdomen is flat.     Palpations: Abdomen is soft.  Musculoskeletal:        General: Normal range of motion.     Cervical back: Normal range of motion.  Skin:    General: Skin is warm and dry.     Capillary Refill: Capillary refill takes less than 2 seconds.  Neurological:     General: No focal deficit present.     Mental Status: She is alert. Mental status is at baseline.  Psychiatric:        Mood and Affect: Mood normal.        Behavior: Behavior normal.         06/03/2023    9:26 AM 05/26/2023    3:51 PM 01/12/2023    1:45 PM 01/01/2023    3:55 PM 12/18/2022    3:40 PM  Depression screen PHQ 2/9  Decreased Interest 0 1 1 1 1   Down, Depressed, Hopeless 0 1 1 1 1   PHQ - 2 Score 0 2 2 2 2   Altered sleeping  1 1 1 1   Tired, decreased energy  1 1 1 1   Change in appetite  1 1 0 1  Feeling bad or failure about yourself   1 1 0 1  Trouble concentrating  1 1 0 1  Moving slowly or fidgety/restless  1 1 0 1  Suicidal thoughts  1 1 0 1  PHQ-9 Score  9 9 4 9   Difficult doing work/chores  Somewhat difficult Not difficult at all Somewhat difficult Somewhat difficult        06/03/2023    9:26 AM 05/26/2023    3:52 PM 01/12/2023    1:45 PM 01/01/2023    3:56 PM  GAD 7 : Generalized Anxiety Score  Nervous, Anxious, on Edge 0 1 1 1   Control/stop worrying 0 1 1 1   Worry too much - different things 0 1 1 1   Trouble relaxing 0 1 1 1   Restless 0 1 1 1   Easily annoyed or irritable 0 1 1 0  Afraid - awful might happen 0 1 1 1   Total GAD 7 Score 0 7 7 6   Anxiety Difficulty Not difficult at all Not difficult at all Not difficult at all Somewhat difficult       Assessment & Plan:  Assessment & Plan   Viral upper respiratory tract infection Flu-like symptoms Presents with congestion, headache, ear pain, cough, sore throat, and nausea. No vomiting. Currently taking DayQuil and Tamiflu. Flu swab negative. Strep swab negative. COVID results pending. Discussed the possibility of a viral infection that may self-resolve or develop into a bacterial infection requiring antibiotics if ongoing symptoms after 7-10 days. -Continue DayQuil and Tamiflu. -If not improved by 7 days from symptom onset (Sunday), consider z pack -If COVID test returns positive, will prescribe appropriate treatment. -Provide medication for nausea. -Check ears and throat, both normal. -Follow-up message on Sunday to determine need for further treatment -     Rapid Strep Screen ([facility] ONLY) -     Novel Coronavirus, NAA ([facility]) -     Veritor Flu A/B Waived -     Ondansetron HCl; Take 1 tablet (4 mg total) by mouth every 8 (eight) hours as needed for nausea or vomiting.  Dispense: 20 tablet; Refill: 0  Encounter for behavioral health screening As part of their intake evaluation, the patient was screened for depression, anxiety.  PHQ2 SCORE 0, GAD7 SCORE 0. Screening results negative for tested conditions. CTM.  Follow up plan: Return if symptoms worsen or fail to improve.  Jackolyn Confer, MD  Approximately 30 minutes spent on patient encounter today including assessment, counseling,  diagnosing, treatment plan development, and charting.

## 2023-06-03 NOTE — Patient Instructions (Addendum)
 If not better by Sunday, please send me a message.  Most cold symptoms last up to 2 weeks, but cough can sometimes linger up to 4 weeks.  However if your symtpoms get WORSE - like you develop fevers or get more shortness of breath, then call your clinic as you may need to be evaluated.   Aches and Pains Acetaminophen (Tylenol): 1000mg  ("extra strength" tablets are 500mg , so take 2) every 8 hours if needed  Ibuprofen (Advil/Motrin) 400-800mg  (comes in 200mg  pills OTC, so 2-4 pills) every 8 hours. - Avoid in excess if cardiac blood pressure issues  Sore Throat:  See Aches and Pains meds above, also Sore throat sprays and lozenges may also help.   Cough:  Honey 2 TBS every 4-6 hours if needed.  Robitussin DM syrup or generic equivalent which has (guaifenesin = an expectorant to help you get stuff up + dextromethorphan (DM) = cough supressant). You can also get this in tablet formula (like Mucinex DM or generic equivalent).  If you have asthma or are wheezing and have a tight chest, then albuterol inhaler (Ventolin, ProAir) may be helpful - you need a prescription for this.   Congestion:  oxymetazoline (Afrin) nasal stray: 2 sprays each nostril every 12 hours. Don't use more than 3 days in a row to avoid building a tolerance to it.  Sinus rinse (neti pot) high volume sinus rinse can help open up your sinuses and be helpful, especially if you're having sinus pressure and headaches.   Other:  Umcka (pelargonium sidoides extract) can to shorten cold symptoms (can be hard to find, but [employer] carries it: brand name Umcka ColdCare from [employer]). Works best if you start taking at earliest signs of cold symptoms.  Andrographis paniculata is another herbal remedy with less evidence, but may reduce common cold symptoms in adults.  zinc acetate lozenges >= 80 mg/day reduces duration but not severity of cold symptoms in adults, but it is associated with bad taste and nausea Heated humidified air  may reduce cold symptoms, so try using a humidifier - especially in your bedroom at night.  Stay hydrated! Aim to drink at least 2 liters of water daily.   What doesn't work (but lots of folks think might) Vitamin C: bummer right?! But there's no evidence that high dose vitamin C will help cold symptoms.

## 2023-06-05 LAB — RAPID STREP SCREEN (MED CTR MEBANE ONLY): Strep Gp A Ag, IA W/Reflex: NEGATIVE

## 2023-06-05 LAB — VERITOR FLU A/B WAIVED
Influenza A: NEGATIVE
Influenza B: NEGATIVE

## 2023-06-05 LAB — NOVEL CORONAVIRUS, NAA: SARS-CoV-2, NAA: NOT DETECTED

## 2023-06-05 LAB — CULTURE, GROUP A STREP

## 2023-06-08 ENCOUNTER — Encounter: Payer: Commercial Managed Care - PPO | Admitting: Physical Therapy

## 2023-06-08 ENCOUNTER — Other Ambulatory Visit: Payer: Self-pay

## 2023-06-08 DIAGNOSIS — M5412 Radiculopathy, cervical region: Secondary | ICD-10-CM | POA: Diagnosis not present

## 2023-06-08 DIAGNOSIS — M4802 Spinal stenosis, cervical region: Secondary | ICD-10-CM | POA: Diagnosis not present

## 2023-06-08 MED ORDER — DIAZEPAM 5 MG PO TABS
5.0000 mg | ORAL_TABLET | Freq: Once | ORAL | 0 refills | Status: AC
  Filled 2023-06-08: qty 1, 1d supply, fill #0

## 2023-06-11 ENCOUNTER — Encounter: Payer: Commercial Managed Care - PPO | Admitting: Physical Therapy

## 2023-06-15 ENCOUNTER — Telehealth: Payer: Self-pay | Admitting: Physical Therapy

## 2023-06-15 ENCOUNTER — Ambulatory Visit: Payer: Commercial Managed Care - PPO | Attending: Family Medicine | Admitting: Physical Therapy

## 2023-06-15 NOTE — Telephone Encounter (Signed)
 Pt missed scheduled PT treatment without notifying clinic. PT reached out to PT to notify pt of missed appointment and provided time and date for next PT appointment; 06/18/2023 at 8:00am.  Of note, pt has had multiple MD appointments to address neck and back pain since last PT appointment on 2/17.  Requested return phone call from PT to confirm continued PT services.   Grier Rocher PT, DPT  Physical Therapist - [place]  [facility]  10:40 AM 06/15/23

## 2023-06-16 ENCOUNTER — Other Ambulatory Visit: Payer: Self-pay

## 2023-06-18 ENCOUNTER — Encounter: Payer: Commercial Managed Care - PPO | Admitting: Physical Therapy

## 2023-06-19 ENCOUNTER — Other Ambulatory Visit: Payer: Self-pay

## 2023-06-22 ENCOUNTER — Other Ambulatory Visit: Payer: Self-pay

## 2023-06-22 ENCOUNTER — Encounter: Payer: Commercial Managed Care - PPO | Admitting: Physical Therapy

## 2023-06-22 DIAGNOSIS — M5412 Radiculopathy, cervical region: Secondary | ICD-10-CM | POA: Diagnosis not present

## 2023-06-22 DIAGNOSIS — M4802 Spinal stenosis, cervical region: Secondary | ICD-10-CM | POA: Diagnosis not present

## 2023-06-24 ENCOUNTER — Encounter: Payer: Commercial Managed Care - PPO | Admitting: Physical Therapy

## 2023-06-25 ENCOUNTER — Other Ambulatory Visit: Payer: Self-pay

## 2023-06-26 ENCOUNTER — Encounter: Payer: Self-pay | Admitting: Family Medicine

## 2023-06-29 ENCOUNTER — Encounter: Payer: Commercial Managed Care - PPO | Admitting: Physical Therapy

## 2023-07-01 ENCOUNTER — Encounter: Payer: Commercial Managed Care - PPO | Admitting: Physical Therapy

## 2023-07-06 ENCOUNTER — Encounter: Payer: Commercial Managed Care - PPO | Admitting: Physical Therapy

## 2023-07-08 ENCOUNTER — Encounter: Payer: Commercial Managed Care - PPO | Admitting: Physical Therapy

## 2023-07-13 ENCOUNTER — Encounter: Payer: Commercial Managed Care - PPO | Admitting: Physical Therapy

## 2023-07-15 ENCOUNTER — Encounter: Payer: Commercial Managed Care - PPO | Admitting: Physical Therapy

## 2023-07-20 ENCOUNTER — Encounter: Payer: Commercial Managed Care - PPO | Admitting: Physical Therapy

## 2023-07-20 DIAGNOSIS — M5412 Radiculopathy, cervical region: Secondary | ICD-10-CM | POA: Diagnosis not present

## 2023-07-20 DIAGNOSIS — M4802 Spinal stenosis, cervical region: Secondary | ICD-10-CM | POA: Diagnosis not present

## 2023-07-22 ENCOUNTER — Encounter: Payer: Commercial Managed Care - PPO | Admitting: Physical Therapy

## 2023-07-27 ENCOUNTER — Encounter: Payer: Commercial Managed Care - PPO | Admitting: Physical Therapy

## 2023-07-29 ENCOUNTER — Encounter: Payer: Commercial Managed Care - PPO | Admitting: Physical Therapy

## 2023-08-03 ENCOUNTER — Encounter: Payer: Commercial Managed Care - PPO | Admitting: Physical Therapy

## 2023-08-05 ENCOUNTER — Encounter: Payer: Commercial Managed Care - PPO | Admitting: Physical Therapy

## 2023-08-10 ENCOUNTER — Other Ambulatory Visit: Payer: Self-pay

## 2023-08-10 ENCOUNTER — Other Ambulatory Visit: Payer: Self-pay | Admitting: Family Medicine

## 2023-08-10 ENCOUNTER — Encounter: Payer: Commercial Managed Care - PPO | Admitting: Physical Therapy

## 2023-08-11 ENCOUNTER — Other Ambulatory Visit: Payer: Self-pay

## 2023-08-12 ENCOUNTER — Encounter: Payer: Self-pay | Admitting: Dermatology

## 2023-08-12 ENCOUNTER — Ambulatory Visit: Payer: Commercial Managed Care - PPO | Admitting: Dermatology

## 2023-08-12 ENCOUNTER — Other Ambulatory Visit: Payer: Self-pay

## 2023-08-12 ENCOUNTER — Encounter: Payer: Commercial Managed Care - PPO | Admitting: Physical Therapy

## 2023-08-12 DIAGNOSIS — Z86018 Personal history of other benign neoplasm: Secondary | ICD-10-CM

## 2023-08-12 DIAGNOSIS — W908XXA Exposure to other nonionizing radiation, initial encounter: Secondary | ICD-10-CM | POA: Diagnosis not present

## 2023-08-12 DIAGNOSIS — I83893 Varicose veins of bilateral lower extremities with other complications: Secondary | ICD-10-CM | POA: Diagnosis not present

## 2023-08-12 DIAGNOSIS — Z1283 Encounter for screening for malignant neoplasm of skin: Secondary | ICD-10-CM | POA: Diagnosis not present

## 2023-08-12 DIAGNOSIS — Z79899 Other long term (current) drug therapy: Secondary | ICD-10-CM

## 2023-08-12 DIAGNOSIS — L578 Other skin changes due to chronic exposure to nonionizing radiation: Secondary | ICD-10-CM | POA: Diagnosis not present

## 2023-08-12 DIAGNOSIS — M79605 Pain in left leg: Secondary | ICD-10-CM

## 2023-08-12 DIAGNOSIS — L821 Other seborrheic keratosis: Secondary | ICD-10-CM

## 2023-08-12 DIAGNOSIS — D229 Melanocytic nevi, unspecified: Secondary | ICD-10-CM

## 2023-08-12 DIAGNOSIS — L814 Other melanin hyperpigmentation: Secondary | ICD-10-CM

## 2023-08-12 DIAGNOSIS — M79604 Pain in right leg: Secondary | ICD-10-CM

## 2023-08-12 DIAGNOSIS — D1801 Hemangioma of skin and subcutaneous tissue: Secondary | ICD-10-CM | POA: Diagnosis not present

## 2023-08-12 DIAGNOSIS — L820 Inflamed seborrheic keratosis: Secondary | ICD-10-CM | POA: Diagnosis not present

## 2023-08-12 DIAGNOSIS — I781 Nevus, non-neoplastic: Secondary | ICD-10-CM

## 2023-08-12 DIAGNOSIS — Z7189 Other specified counseling: Secondary | ICD-10-CM

## 2023-08-12 DIAGNOSIS — L219 Seborrheic dermatitis, unspecified: Secondary | ICD-10-CM

## 2023-08-12 MED ORDER — KETOCONAZOLE 2 % EX SHAM
MEDICATED_SHAMPOO | CUTANEOUS | 11 refills | Status: AC
  Filled 2023-08-12: qty 120, 30d supply, fill #0

## 2023-08-12 NOTE — Telephone Encounter (Signed)
 Requested medication (s) are due for refill today: Yes  Requested medication (s) are on the active medication list: Yes  Last refill:  01/10/22  Future visit scheduled: No  Notes to clinic:  Prescription has expired.    Requested Prescriptions  Pending Prescriptions Disp Refills   valACYclovir  (VALTREX ) 500 MG tablet 10 tablet 12    Sig: Take 1 tablet (500 mg total) by mouth 2 (two) times daily.     Antimicrobials:  Antiviral Agents - Anti-Herpetic Passed - 08/12/2023  9:50 AM      Passed - Valid encounter within last 12 months    Recent Outpatient Visits           2 months ago Viral upper respiratory tract infection   [facility] Hadassah Letters, MD   2 months ago Osteoarthritis of spine with radiculopathy, cervical region   [facility] Haring, Jerilee Montane, DO       Future Appointments             Today Elta Halter, MD [facility]

## 2023-08-12 NOTE — Patient Instructions (Addendum)

## 2023-08-12 NOTE — Progress Notes (Signed)
 Follow-Up Visit   Subjective  Karen Doxsee Cyphers V. is a 53 y.o. female who presents for the following: Skin Cancer Screening and Full Body Skin Exam Hx of dysplastic nevi, hx of varicose veins Patient reports itchy scalp, spot at scalp that is sometimes painful and comes and goes. Patient also is bothered by pain with varicose veins at lower extremities.   The patient presents for Total-Body Skin Exam (TBSE) for skin cancer screening and mole check. The patient has spots, moles and lesions to be evaluated, some may be new or changing and the patient may have concern these could be cancer.  The following portions of the chart were reviewed this encounter and updated as appropriate: medications, allergies, medical history  Review of Systems:  No other skin or systemic complaints except as noted in HPI or Assessment and Plan.  Objective  Well appearing patient in no apparent distress; mood and affect are within normal limits.  A full examination was performed including scalp, head, eyes, ears, nose, lips, neck, chest, axillae, abdomen, back, buttocks, bilateral upper extremities, bilateral lower extremities, hands, feet, fingers, toes, fingernails, and toenails. All findings within normal limits unless otherwise noted below.   Relevant physical exam findings are noted in the Assessment and Plan.  right chest x 3 (3) Erythematous stuck-on, waxy papule or plaque  Assessment & Plan   SKIN CANCER SCREENING PERFORMED TODAY.  ACTINIC DAMAGE - Chronic condition, secondary to cumulative UV/sun exposure - diffuse scaly erythematous macules with underlying dyspigmentation - Recommend daily broad spectrum sunscreen SPF 30+ to sun-exposed areas, reapply every 2 hours as needed.  - Staying in the shade or wearing long sleeves, sun glasses (UVA+UVB protection) and wide brim hats (4-inch brim around the entire circumference of the hat) are also recommended for sun protection.  - Call for new or  changing lesions.  LENTIGINES, SEBORRHEIC KERATOSES, HEMANGIOMAS - Benign normal skin lesions - Benign-appearing - Call for any changes  MELANOCYTIC NEVI - Tan-brown and/or pink-flesh-colored symmetric macules and papules - Benign appearing on exam today - Observation - Call clinic for new or changing moles - Recommend daily use of broad spectrum spf 30+ sunscreen to sun-exposed areas.   Varicose Veins/Spider Veins Pain of legs - probable Varicose vein pain. - Dilated blue, purple or red veins at the lower extremities - Reassured - Smaller vessels can be treated by sclerotherapy (a procedure to inject a medicine into the veins to make them disappear) if desired, but the treatment is not covered by insurance. Larger vessels may be covered if symptomatic and we would refer to vascular surgeon if treatment desired.  Patient accepts recommendation for  vascular surgery  referral  - she is bothered by pain associated with veins in lower extremities    SEBORRHEIC DERMATITIS with Pruritus  Exam: Pink patches with greasy scale at scalp Chronic and persistent condition with duration or expected duration over one year. Condition is symptomatic/ bothersome to patient. Not currently at goal. Seborrheic Dermatitis is a chronic persistent rash characterized by pinkness and scaling most commonly of the mid face but also can occur on the scalp (dandruff), ears; mid chest, mid back and groin.  It tends to be exacerbated by stress and cooler weather.  People who have neurologic disease may experience new onset or exacerbation of existing seborrheic dermatitis.  The condition is not curable but treatable and can be controlled. Treatment Plan: Start ketoconazole 2 % shampoo - apply 2 to 3 times per week, massage into scalp  and leave in for 5 minutes before rinsing out  History of Dysplastic Nevus -  08/07/2022 left middle medial calf - moderate to severe -  02/14/2020-R foot inf to her med maleolus (mod to  severe - close to margin)  - No evidence of recurrence today - Recommend regular full body skin exams - Recommend daily broad spectrum sunscreen SPF 30+ to sun-exposed areas, reapply every 2 hours as needed.  - Call if any new or changing lesions are noted between office visits  SYMPTOMATIC VARICOSE VEINS OF BOTH LOWER EXTREMITIES   Related Procedures Ambulatory referral to Vascular Surgery INFLAMED SEBORRHEIC KERATOSIS (3) right chest x 3 (3) Symptomatic, irritating, patient would like treated. Destruction of lesion - right chest x 3 (3) Complexity: simple   Destruction method: cryotherapy   Informed consent: discussed and consent obtained   Timeout:  patient name, date of birth, surgical site, and procedure verified Lesion destroyed using liquid nitrogen: Yes   Region frozen until ice ball extended beyond lesion: Yes   Outcome: patient tolerated procedure well with no complications   Post-procedure details: wound care instructions given   SEBORRHEIC DERMATITIS   Related Medications ketoconazole (NIZORAL) 2 % shampoo Apply 2 to 3 times per week, massage into scalp and leave in for 5 minutes before rinsing out Return in about 1 year (around 08/11/2024) for TBSE.  IRandee Busing, CMA, am acting as scribe for Celine Collard, MD.   Documentation: I have reviewed the above documentation for accuracy and completeness, and I agree with the above.  Celine Collard, MD

## 2023-08-13 ENCOUNTER — Other Ambulatory Visit: Payer: Self-pay

## 2023-08-13 MED FILL — Valacyclovir HCl Tab 500 MG: ORAL | 5 days supply | Qty: 10 | Fill #0 | Status: AC

## 2023-08-14 ENCOUNTER — Other Ambulatory Visit: Payer: Self-pay

## 2023-08-17 ENCOUNTER — Encounter: Payer: Commercial Managed Care - PPO | Admitting: Physical Therapy

## 2023-08-19 ENCOUNTER — Encounter: Payer: Commercial Managed Care - PPO | Admitting: Physical Therapy

## 2023-09-03 ENCOUNTER — Ambulatory Visit: Payer: Commercial Managed Care - PPO | Admitting: Dermatology

## 2023-09-04 ENCOUNTER — Telehealth: Payer: Self-pay | Admitting: Family Medicine

## 2023-09-04 NOTE — Telephone Encounter (Signed)
 Copied from CRM [id number]. Topic: General - Other >> Aug 27, 2023  4:33 PM Phil Braun wrote: Reason for CRM: Pt called and wanted to know if we had received her FMLA paperwork? It is time to have it renewed. Please advise. [phone] >> Sep 03, 2023  3:57 PM Iris P wrote: Contacted patient for her to have FMLA paperwork resent to the office >> Aug 27, 2023  5:38 PM Corin V wrote: .

## 2023-09-07 ENCOUNTER — Ambulatory Visit: Admitting: Family Medicine

## 2023-09-07 NOTE — Telephone Encounter (Signed)
 Karen Walker spoke with patient and handled in clinic today. 09/07/23

## 2023-09-21 ENCOUNTER — Encounter: Payer: Self-pay | Admitting: Family Medicine

## 2023-09-21 ENCOUNTER — Ambulatory Visit: Admitting: Family Medicine

## 2023-09-21 VITALS — BP 91/64 | HR 87 | Temp 98.0°F | Ht 64.0 in | Wt 166.8 lb

## 2023-09-21 DIAGNOSIS — M79642 Pain in left hand: Secondary | ICD-10-CM | POA: Diagnosis not present

## 2023-09-21 DIAGNOSIS — M79641 Pain in right hand: Secondary | ICD-10-CM

## 2023-09-21 DIAGNOSIS — Z23 Encounter for immunization: Secondary | ICD-10-CM | POA: Diagnosis not present

## 2023-09-21 NOTE — Progress Notes (Signed)
 BP 91/64 (BP Location: Left Arm, Patient Position: Sitting, Cuff Size: Normal)   Pulse 87   Temp 98 F (36.7 C) (Oral)   Ht 5' 4 (1.626 m)   Wt 166 lb 12.8 oz (75.7 kg)   SpO2 94%   BMI 28.63 kg/m    Subjective:    Patient ID: Karen Earnest., female    DOB: Aug 17, 1970, 53 y.o.   MRN: [id number]  HPI: Karen Walker is a 53 y.o. female  Chief Complaint  Patient presents with   FMLA PAPERWORK   Hand Pain    Right hand: pain, numbness, and tinging. Pt states that it has been going on for a long time but has progressively getting worse.    HAND PAIN-  Duration: about a month ago Involved hand: R >L Mechanism of injury: unknown Location: between the knuckles on the hand Onset: gradual Severity: severe  Quality: aching, sharp Frequency: intermittent Radiation: up her arm to her elbow Aggravating factors: work Alleviating factors: voltaren  gel Treatments attempted: voltaren  gel Relief with NSAIDs?: moderate Weakness: yes Numbness: yes Redness: no Swelling:yes Bruising: no Fevers: no  Relevant past medical, surgical, family and social history reviewed and updated as indicated. Interim medical history since our last visit reviewed. Allergies and medications reviewed and updated.  Review of Systems  Constitutional: Negative.   Respiratory: Negative.    Cardiovascular: Negative.   Musculoskeletal:  Positive for arthralgias and myalgias. Negative for back pain, gait problem, joint swelling, neck pain and neck stiffness.  Skin: Negative.   Neurological: Negative.   Psychiatric/Behavioral: Negative.      Per HPI unless specifically indicated above     Objective:    BP 91/64 (BP Location: Left Arm, Patient Position: Sitting, Cuff Size: Normal)   Pulse 87   Temp 98 F (36.7 C) (Oral)   Ht 5' 4 (1.626 m)   Wt 166 lb 12.8 oz (75.7 kg)   SpO2 94%   BMI 28.63 kg/m   Wt Readings from Last 3 Encounters:  09/21/23 166 lb 12.8 oz (75.7 kg)  06/03/23 162 lb (73.5  kg)  05/26/23 168 lb (76.2 kg)    Physical Exam Vitals and nursing note reviewed.  Constitutional:      General: She is not in acute distress.    Appearance: Normal appearance. She is not ill-appearing, toxic-appearing or diaphoretic.  HENT:     Head: Normocephalic and atraumatic.     Right Ear: External ear normal.     Left Ear: External ear normal.     Nose: Nose normal.     Mouth/Throat:     Mouth: Mucous membranes are moist.     Pharynx: Oropharynx is clear.   Eyes:     General: No scleral icterus.       Right eye: No discharge.        Left eye: No discharge.     Extraocular Movements: Extraocular movements intact.     Conjunctiva/sclera: Conjunctivae normal.     Pupils: Pupils are equal, round, and reactive to light.    Cardiovascular:     Rate and Rhythm: Normal rate and regular rhythm.     Pulses: Normal pulses.     Heart sounds: Normal heart sounds. No murmur heard.    No friction rub. No gallop.  Pulmonary:     Effort: Pulmonary effort is normal. No respiratory distress.     Breath sounds: Normal breath sounds. No stridor. No wheezing, rhonchi or rales.  Chest:  Chest wall: No tenderness.   Musculoskeletal:        General: Tenderness (DIP joints on both hands) present. No swelling, deformity or signs of injury. Normal range of motion.     Cervical back: Normal range of motion and neck supple.     Right lower leg: No edema.     Left lower leg: No edema.     Comments: Small cyst on tendon of R dorsal tendons on back of her hand   Skin:    General: Skin is warm and dry.     Capillary Refill: Capillary refill takes less than 2 seconds.     Coloration: Skin is not jaundiced or pale.     Findings: No bruising, erythema, lesion or rash.   Neurological:     General: No focal deficit present.     Mental Status: She is alert and oriented to person, place, and time. Mental status is at baseline.   Psychiatric:        Mood and Affect: Mood normal.         Behavior: Behavior normal.        Thought Content: Thought content normal.        Judgment: Judgment normal.     Results for orders placed or performed in visit on 06/03/23  Rapid Strep Screen ([facility] ONLY)   Collection Time: 06/03/23  9:25 AM   Specimen: Other   Other  Release to pati  Result Value Ref Range   Strep Gp A Ag, IA W/Reflex Negative Negative  Culture, Group A Strep   Collection Time: 06/03/23  9:25 AM   Other  Release to pati  Result Value Ref Range   Strep A Culture Negative   Veritor Flu A/B Waived   Collection Time: 06/03/23  9:25 AM  Result Value Ref Range   Influenza A Negative Negative   Influenza B Negative Negative  Novel Coronavirus, NAA ([facility])   Collection Time: 06/03/23  9:33 AM   Specimen: Nasopharyngeal(NP) swabs in vial transport medium  Result Value Ref Range   SARS-CoV-2, NAA Not Detected Not Detected      Assessment & Plan:   Problem List Items Addressed This Visit   None Visit Diagnoses       Bilateral hand pain    -  Primary   Will check labs and obtain x-rays of hands. Referral to hand specialist placed today. Continue to monitor. Call with any concerns.   Relevant Orders   DG Hand Complete Left   DG Hand Complete Right   Ambulatory referral to Hand Surgery   RA Qn+CCP(IgG/A)+SjoSSA+SjoSSB   ANA 12 Plus Profile (RDL)   C-reactive protein   Sed Rate (ESR)     Need for Tdap vaccination       Relevant Orders   Tdap vaccine greater than or equal to 7yo IM        Follow up plan: Return for October- physical.

## 2023-09-24 ENCOUNTER — Telehealth: Payer: Self-pay | Admitting: Family Medicine

## 2023-09-24 NOTE — Telephone Encounter (Signed)
 Patient brought in FMLA paperwork to me completed with corrected dates. Paperwork was given to ML to be completed. Please call pt when completed. Prajna [phone] (Spanish Interpreter Needed)

## 2023-09-27 LAB — RA QN+CCP(IGG/A)+SJOSSA+SJOSSB
Cyclic Citrullin Peptide Ab: 7 U (ref 0–19)
ENA SSA (RO) Ab: <0.2 AI (ref 0.0–0.9)
ENA SSB (LA) Ab: <0.2 AI (ref 0.0–0.9)
Rheumatoid fact SerPl-aCnc: <10 [IU]/mL (ref <14.0)

## 2023-09-27 LAB — ANTI-RO/NEG ANA

## 2023-09-27 LAB — SEDIMENTATION RATE: Sed Rate: 8 mm/h (ref 0–40)

## 2023-09-27 LAB — ANA 12 PLUS PROFILE (RDL): Anti-Nuclear Ab by IFA (RDL): NEGATIVE

## 2023-09-27 LAB — C-REACTIVE PROTEIN: CRP: 1 mg/L (ref 0–10)

## 2023-09-28 ENCOUNTER — Ambulatory Visit: Payer: Self-pay | Admitting: Family Medicine

## 2023-09-30 ENCOUNTER — Ambulatory Visit: Admitting: Nurse Practitioner

## 2023-09-30 ENCOUNTER — Encounter: Payer: Self-pay | Admitting: Nurse Practitioner

## 2023-09-30 ENCOUNTER — Other Ambulatory Visit: Payer: Self-pay

## 2023-09-30 ENCOUNTER — Ambulatory Visit: Payer: Self-pay

## 2023-09-30 VITALS — BP 93/60 | HR 87 | Temp 97.6°F | Ht 64.0 in | Wt 170.0 lb

## 2023-09-30 DIAGNOSIS — R21 Rash and other nonspecific skin eruption: Secondary | ICD-10-CM | POA: Diagnosis not present

## 2023-09-30 MED ORDER — SULFAMETHOXAZOLE-TRIMETHOPRIM 800-160 MG PO TABS
1.0000 | ORAL_TABLET | Freq: Two times a day (BID) | ORAL | 0 refills | Status: AC
  Filled 2023-09-30: qty 14, 7d supply, fill #0

## 2023-09-30 NOTE — Telephone Encounter (Signed)
 FYI Only or Action Required?: FYI only for provider.  Patient was last seen in primary care on 09/21/2023 by Vicci Duwaine SQUIBB, DO. Called Nurse Triage reporting Insect Bite. Symptoms began several weeks ago. Interventions attempted: Nothing. Symptoms are: gradually worsening.  Triage Disposition: See Physician Within 24 Hours  Patient/caregiver understands and will follow disposition?: Yes, will follow disposition  Copied from CRM [id number]. Topic: Clinical - Red Word Triage >> Sep 30, 2023  3:11 PM Powell HERO wrote: Red Word that prompted transfer to Nurse Triage: Possible spider but on the left side close to ribs/waist. Week two since the bite, swollen and itchy. Answer Assessment - Initial Assessment Questions 1. TYPE of SPIDER: What type of spider was it?  (e.g., name, unknown, or brief description)     unsure 2. LOCATION: Where is the bite located?      L rib area 3. PAIN: Is there any pain? If Yes, ask: How bad is it?  (Scale 1-10; or mild, moderate, severe)    - NONE (0): no pain    - MILD (1-3): doesn't interfere with normal activities     - MODERATE (4-7): interferes with normal activities or awakens from sleep     - SEVERE (8-10): excruciating pain, unable to do any normal activities     5, worse with palpation 4. SWELLING: How big is the swelling? (Inches, cm or compare to coins)      swelling 5. ONSET: When did the bite occur? (Minutes or hours ago)      2 weeks ago 6. TETANUS: When was the last tetanus booster?      yes 7. OTHER SYMPTOMS: Do you have any other symptoms?  (e.g., muscle cramps, abdomen pain, change in urine color)     denies  Protocols used: Spider Bite - [facility]  Reason for Disposition  [1] Red or very tender (to touch) area AND [2] started over 24 hours after the bite  Answer Assessment - Initial Assessment Questions 1. TYPE of SPIDER: What type of spider was it?  (e.g., name, unknown, or brief description)     unsure 2.  LOCATION: Where is the bite located?      L rib area 3. PAIN: Is there any pain? If Yes, ask: How bad is it?  (Scale 1-10; or mild, moderate, severe)    - NONE (0): no pain    - MILD (1-3): doesn't interfere with normal activities     - MODERATE (4-7): interferes with normal activities or awakens from sleep     - SEVERE (8-10): excruciating pain, unable to do any normal activities     5, worse with palpation 4. SWELLING: How big is the swelling? (Inches, cm or compare to coins)      swelling 5. ONSET: When did the bite occur? (Minutes or hours ago)      2 weeks ago 6. TETANUS: When was the last tetanus booster?      yes 7. OTHER SYMPTOMS: Do you have any other symptoms?  (e.g., muscle cramps, abdomen pain, change in urine color)     denies  Protocols used: Spider Bite - [employer]

## 2023-09-30 NOTE — Patient Instructions (Signed)
Insect Bite, Adult An insect bite can make your skin red, itchy, and swollen. Some insects can spread disease to people with a bite. However, most insect bites do not lead to disease, and most are not serious. What are the causes? Insects may bite for many reasons, including: Hunger. To defend themselves. Insects that bite include: Spiders. Mosquitoes. Flies. Ticks and fleas. Ants. Kissing bugs. Chiggers. What are the signs or symptoms? Symptoms often last for 2-4 days. However, itching can last up to 10 days. Symptoms include: Itching or pain in the bite area. Redness and swelling in the bite area. An open wound. In rare cases, a person may have a very bad allergic reaction (anaphylactic reaction) to a bite. Symptoms of an anaphylactic reaction may include: Feeling warm in the face (flushed). Your face may turn red. Itchy, red, swollen areas of skin (hives). Swelling of the eyes, lips, face, mouth, tongue, or throat. Trouble with breathing, talking, or swallowing. High-pitched whistling sounds, most often when breathing out (wheezing). Feeling dizzy or light-headed. Fainting. Pain or cramps in your belly (abdomen). Vomiting. Watery poop (diarrhea). How is this treated? Most insect bites are not serious. Symptoms often go away on their own. When treatment is advised, it may include: Putting ice on the bite area. Putting a cream or lotion, like calamine lotion, on the bite area. This helps with itching. Using medicines called antihistamines. You may also need: A tetanus shot if you are not up to date. An antibiotic cream or medicine. This treatment is needed if the bite area gets infected. Follow these instructions at home: Bite area care  Do not scratch the bite area. It may help to cover the bite area with a bandage or close-fitting clothing. Keep the bite area clean and dry. Check the bite area every day for signs of infection. Check for: More redness, swelling, or  pain. Fluid or blood. Warmth. Pus or a bad smell. Wash your hands often. Managing pain, itching, and swelling  You may put any of these on the bite area as told by your doctor: A paste made of baking soda and water. Cortisone cream. Calamine lotion. If told, put ice on the bite area. To do this: Put ice in a plastic bag. Place a towel between your skin and the bag. Leave the ice on for 20 minutes, 2-3 times a day. If your skin turns bright red, take off the ice right away to prevent skin damage. The risk of skin damage is higher if you cannot feel pain, heat, or cold. General instructions Apply or take over-the-counter and prescription medicines only as told by your doctor. If you were prescribed antibiotics, take or apply them as told by your doctor. Do not stop using them even if you start to feel better. How is this prevented? To help you have a lower risk of insect bites: When you are outside, wear clothes that cover your arms and legs. Use insect repellent. The best insect repellents contain one of these: DEET. Picaridin. Oil of lemon eucalyptus (OLE). IR3535. Consider spraying your clothing with a pesticide called permethrin. Permethrin helps prevent insect bites. It works for several weeks and for up to 5-6 clothing washes. Do not apply permethrin directly to the skin. If your home windows do not have screens, think about putting some in. If you will be sleeping in an area where there are mosquitoes, consider covering your sleeping area with a mosquito net. Contact a doctor if: You have redness, swelling, or pain   in the bite area. You have fluid or blood coming from the bite area. The bite area feels warm to the touch. You have pus or a bad smell coming from the bite area. You have a fever. Get help right away if: You have joint pain. You have a rash. You feel weak or more tired than you normally do. You have neck pain or a headache. You have signs of an anaphylactic  reaction. Signs may include: Swelling of your eyes, lips, face, mouth, tongue, or throat. Feeling warm in the face. Itchy, red, swollen areas of skin. Trouble with breathing, talking, or swallowing. Wheezing. Feeling dizzy or light-headed. Fainting. Pain or cramps in your belly. Vomiting or watery poop. These symptoms may be an emergency. Get help right away. Call 911. Do not wait to see if symptoms will go away. Do not drive yourself to the hospital. Summary An insect bite can make your skin red, itchy, and swollen. Treatment is usually not needed. Symptoms often go away on their own. Do not scratch the bite area. Keep it clean and dry. Use insect repellent to help prevent insect bites. Contact a doctor if you have signs of infection. This information is not intended to replace advice given to you by your health care provider. Make sure you discuss any questions you have with your health care provider. Document Revised: 06/18/2021 Document Reviewed: 06/18/2021 Elsevier Patient Education  2024 [employer]  

## 2023-09-30 NOTE — Progress Notes (Signed)
 BP 93/60   Pulse 87   Temp 97.6 F (36.4 C) (Oral)   Ht 5' 4 (1.626 m)   Wt 170 lb (77.1 kg)   SpO2 98%   BMI 29.18 kg/m    Subjective:    Patient ID: Karen Leala GAILS., female    DOB: 07/18/1970, 53 y.o.   MRN: [id number]  HPI: Karen Walker is a 53 y.o. female  Chief Complaint  Patient presents with   Insect Bite    Patient states she was bit by something last week on her L side. States the area feels hard and feels painful up to her ribs on that side. States the area has been itching as well.    SKIN INFECTION (BUG BITE) Got bit by a bug last week to left lateral upper and lower.  Did not see what bit her, she was sleeping at the time. Duration: days Location: as above History of trauma in area: no Pain: yes Quality: yes Severity: 9/10, itchy and sore Redness: yes Swelling: yes Oozing: no Pus: no Fevers: no Nausea/vomiting: no Status: fluctuating Treatments attempted: triple abx  Tetanus: UTD   Relevant past medical, surgical, family and social history reviewed and updated as indicated. Interim medical history since our last visit reviewed. Allergies and medications reviewed and updated.  Review of Systems  Constitutional:  Negative for activity change, appetite change, diaphoresis, fatigue and unexpected weight change.  Respiratory:  Negative for cough, chest tightness, shortness of breath and wheezing.   Cardiovascular:  Negative for chest pain, palpitations and leg swelling.  Gastrointestinal: Negative.   Skin:  Positive for rash.  Neurological: Negative.   Psychiatric/Behavioral: Negative.      Per HPI unless specifically indicated above     Objective:    BP 93/60   Pulse 87   Temp 97.6 F (36.4 C) (Oral)   Ht 5' 4 (1.626 m)   Wt 170 lb (77.1 kg)   SpO2 98%   BMI 29.18 kg/m   Wt Readings from Last 3 Encounters:  09/30/23 170 lb (77.1 kg)  09/21/23 166 lb 12.8 oz (75.7 kg)  06/03/23 162 lb (73.5 kg)    Physical Exam Vitals and nursing  note reviewed.  Constitutional:      General: She is awake. She is not in acute distress.    Appearance: She is well-developed and well-groomed. She is not ill-appearing or toxic-appearing.  HENT:     Head: Normocephalic.     Right Ear: Hearing and external ear normal.     Left Ear: Hearing and external ear normal.   Eyes:     General: Lids are normal.        Right eye: No discharge.        Left eye: No discharge.     Conjunctiva/sclera: Conjunctivae normal.     Pupils: Pupils are equal, round, and reactive to light.   Neck:     Thyroid : No thyromegaly.     Vascular: No carotid bruit.   Cardiovascular:     Rate and Rhythm: Normal rate and regular rhythm.     Heart sounds: Normal heart sounds. No murmur heard.    No gallop.  Pulmonary:     Effort: Pulmonary effort is normal. No accessory muscle usage or respiratory distress.     Breath sounds: Normal breath sounds.  Abdominal:     General: Bowel sounds are normal. There is no distension.     Palpations: Abdomen is soft.  Tenderness: There is no abdominal tenderness.   Musculoskeletal:     Cervical back: Normal range of motion and neck supple.     Right lower leg: No edema.     Left lower leg: No edema.  Lymphadenopathy:     Cervical: No cervical adenopathy.   Skin:    General: Skin is warm and dry.     Findings: Rash present.     Comments: To left lateral side (lower aspect) and upper posterior left neck.  No vesicles noted.  Patches of red, moderate size welt like areas.  Both with raised, bite-like area to middle.   Neurological:     Mental Status: She is alert and oriented to person, place, and time.     Deep Tendon Reflexes: Reflexes are normal and symmetric.     Reflex Scores:      Brachioradialis reflexes are 2+ on the right side and 2+ on the left side.      Patellar reflexes are 2+ on the right side and 2+ on the left side.  Psychiatric:        Attention and Perception: Attention normal.        Mood and  Affect: Mood normal.        Speech: Speech normal.        Behavior: Behavior normal. Behavior is cooperative.        Thought Content: Thought content normal.     Results for orders placed or performed in visit on 09/21/23  RA Qn+CCP(IgG/A)+SjoSSA+SjoSSB   Collection Time: 09/21/23  4:32 PM  Result Value Ref Range   Rheumatoid fact SerPl-aCnc <10.0 <14.0 IU/mL   ENA SSA (RO) Ab <0.2 0.0 - 0.9 AI   ENA SSB (LA) Ab <0.2 0.0 - 0.9 AI   Cyclic Citrullin Peptide Ab 7 0 - 19 units  ANA 12 Plus Profile (RDL)   Collection Time: 09/21/23  4:32 PM  Result Value Ref Range   Anti-Nuclear Ab by IFA (RDL) Negative Negative  C-reactive protein   Collection Time: 09/21/23  4:32 PM  Result Value Ref Range   CRP 1 0 - 10 mg/L  Sed Rate (ESR)   Collection Time: 09/21/23  4:32 PM  Result Value Ref Range   Sed Rate 8 0 - 40 mm/hr  Anti-Ro/Neg ANA   Collection Time: 09/21/23  4:32 PM  Result Value Ref Range   Anti-Ro (SS-A) Ab (RDL) <20 <20 Units      Assessment & Plan:   Problem List Items Addressed This Visit       Musculoskeletal and Integument   Rash - Primary   Present for one week after bug bites.  Appears more insect bite-like than shingles, no vesicles noted.  Warmth to both areas and redness.  No tenderness.  Will send in Bactrim  DS BID for 7 days.  Recommend continue to place abx ointment as needed and can take Tylenol  for pain if needed.  All questions answered.        Follow up plan: Return if symptoms worsen or fail to improve.

## 2023-09-30 NOTE — Assessment & Plan Note (Signed)
 Present for one week after bug bites.  Appears more insect bite-like than shingles, no vesicles noted.  Warmth to both areas and redness.  No tenderness.  Will send in Bactrim  DS BID for 7 days.  Recommend continue to place abx ointment as needed and can take Tylenol  for pain if needed.  All questions answered.

## 2023-10-01 ENCOUNTER — Other Ambulatory Visit: Payer: Self-pay

## 2023-10-01 DIAGNOSIS — M7711 Lateral epicondylitis, right elbow: Secondary | ICD-10-CM | POA: Diagnosis not present

## 2023-10-01 DIAGNOSIS — M778 Other enthesopathies, not elsewhere classified: Secondary | ICD-10-CM | POA: Diagnosis not present

## 2023-10-01 MED ORDER — METHYLPREDNISOLONE 4 MG PO TBPK
ORAL_TABLET | ORAL | 0 refills | Status: AC
  Filled 2023-10-01: qty 21, 6d supply, fill #0

## 2023-10-26 ENCOUNTER — Other Ambulatory Visit (INDEPENDENT_AMBULATORY_CARE_PROVIDER_SITE_OTHER): Payer: Self-pay | Admitting: Nurse Practitioner

## 2023-10-26 DIAGNOSIS — I83893 Varicose veins of bilateral lower extremities with other complications: Secondary | ICD-10-CM

## 2023-10-28 ENCOUNTER — Encounter (INDEPENDENT_AMBULATORY_CARE_PROVIDER_SITE_OTHER): Admitting: Nurse Practitioner

## 2023-10-28 ENCOUNTER — Encounter (INDEPENDENT_AMBULATORY_CARE_PROVIDER_SITE_OTHER)

## 2023-11-27 ENCOUNTER — Ambulatory Visit (INDEPENDENT_AMBULATORY_CARE_PROVIDER_SITE_OTHER): Admitting: Vascular Surgery

## 2023-11-27 ENCOUNTER — Other Ambulatory Visit (INDEPENDENT_AMBULATORY_CARE_PROVIDER_SITE_OTHER)

## 2023-11-27 ENCOUNTER — Encounter (INDEPENDENT_AMBULATORY_CARE_PROVIDER_SITE_OTHER): Payer: Self-pay | Admitting: Vascular Surgery

## 2023-11-27 VITALS — BP 94/65 | HR 101 | Ht 64.0 in | Wt 163.0 lb

## 2023-11-27 DIAGNOSIS — I83893 Varicose veins of bilateral lower extremities with other complications: Secondary | ICD-10-CM

## 2023-11-27 DIAGNOSIS — K222 Esophageal obstruction: Secondary | ICD-10-CM | POA: Diagnosis not present

## 2023-11-27 NOTE — Progress Notes (Signed)
 Subjective:    Patient ID: Karen Leala GAILS., female    DOB: 1970/08/26, 53 y.o.   MRN: [id number] Chief Complaint  Patient presents with   New Patient (Initial Visit)     LE Reflux + Consult. BLE VV w/pain  ref: K    Karen Walker is a 53 yo female who presents to clinic with chief complaint of bilateral lower extremity varicose veins that are painful with foot and ankle swelling.  Patient was seen by vein and vascular services back in August 2020 with the same complaint.  Patient was started on some conservative therapy such as compression, rest and elevation, and exercise.  She was compliant for a short period which helped somewhat but her pain had returned at that time.  She also acknowledges that she had sclerotherapy to 2 areas of each of her thighs back in 2019 that have left marks and spots on her legs.  She returns today with the same complaints of achy throbbing painful legs and 2 small specific areas where she feels significant pain with some burning after standing and working all day.    Review of Systems  Constitutional: Negative.   Cardiovascular:  Positive for leg swelling.  Musculoskeletal:  Positive for myalgias.  All other systems reviewed and are negative.      Objective:   Physical Exam Vitals reviewed.  Constitutional:      Appearance: Normal appearance. She is normal weight.  HENT:     Head: Normocephalic.  Eyes:     Pupils: Pupils are equal, round, and reactive to light.  Cardiovascular:     Rate and Rhythm: Normal rate and regular rhythm.     Pulses: Normal pulses.     Heart sounds: Normal heart sounds.  Pulmonary:     Effort: Pulmonary effort is normal.     Breath sounds: Normal breath sounds.  Abdominal:     General: Abdomen is flat. Bowel sounds are normal.     Palpations: Abdomen is soft.  Musculoskeletal:        General: Swelling and tenderness present.     Right lower leg: Edema present.     Left lower leg: Edema present.     Comments:  Tenderness to varicose vein areas posterior calf Swelling with tenderness and edema to bilateral feet and ankles  Skin:    General: Skin is warm and dry.     Capillary Refill: Capillary refill takes 2 to 3 seconds.  Neurological:     General: No focal deficit present.     Mental Status: She is alert and oriented to person, place, and time. Mental status is at baseline.  Psychiatric:        Mood and Affect: Mood normal.        Behavior: Behavior normal.        Thought Content: Thought content normal.        Judgment: Judgment normal.     BP 94/65   Pulse (!) 101   Ht 5' 4 (1.626 m)   Wt 163 lb (73.9 kg)   BMI 27.98 kg/m   Past Medical History:  Diagnosis Date   Allergic rhinitis    Anxiety    Breast lump on right side at 8 o'clock position 12/06/2014   Depression    Dysplastic nevus 02/14/2020   Mod to severe - close to margin R foot inf to her med maleolus   Dysplastic nevus 08/07/2022   L middle medical calf - moderate to severe,  recheck at next visit   History of skin cancer    Hx of migraine headaches    light sensivity, unilateral HA   Plantar fasciitis 09/06/2019   Urticaria     Social History   Socioeconomic History   Marital status: Married    Spouse name: Not on file   Number of children: Not on file   Years of education: Not on file   Highest education level: Not on file  Occupational History   Not on file  Tobacco Use   Smoking status: Never   Smokeless tobacco: Never  Vaping Use   Vaping status: Never Used  Substance and Sexual Activity   Alcohol use: No   Drug use: No   Sexual activity: Yes    Birth control/protection: Surgical  Other Topics Concern   Not on file  Social History Narrative   Not on file   Social Drivers of Health   Financial Resource Strain: Not on file  Food Insecurity: Not on file  Transportation Needs: Not on file  Physical Activity: Not on file  Stress: Not on file  Social Connections: Not on file  Intimate  Partner Violence: Not on file    Past Surgical History:  Procedure Laterality Date   BREAST CYST ASPIRATION Right 2013   negative. Done at Dr. Fredirick office   COLONOSCOPY WITH PROPOFOL  N/A 07/20/2020   Procedure: COLONOSCOPY WITH PROPOFOL ;  Surgeon: Janalyn Keene NOVAK, MD;  Location: [facility] END[facility];  Service: Endoscopy;  Laterality: N/A;  SPANISH INTERPRETER   ESOPHAGOGASTRODUODENOSCOPY (EGD) WITH PROPOFOL  N/A 07/20/2020   Procedure: ESOPHAGOGASTRODUODENOSCOPY (EGD) WITH PROPOFOL ;  Surgeon: Janalyn Keene NOVAK, MD;  Location: [facility] ENDOSCOPY;  Service: Endoscopy;  Laterality: N/A;   HERNIA REPAIR  2014   hysterectemy     melonoma removal on R medial arch of foot  2008   TOTAL ABDOMINAL HYSTERECTOMY W/ BILATERAL SALPINGOOPHORECTOMY  2014   TOTAL ABDOMINAL HYSTERECTOMY W/ BILATERAL SALPINGOOPHORECTOMY     UMBILICAL HERNIA REPAIR     occurred with hysterectemy    Family History  Problem Relation Age of Onset   Stroke Father    Asthma Daughter    Urticaria Son    Food Allergy Son        red dye, dairy   Allergic rhinitis Neg Hx    Angioedema Neg Hx    Eczema Neg Hx     Allergies  Allergen Reactions   Codeine Other (See Comments)   Pollen Extract        Latest Ref Rng & Units 01/12/2023    1:49 PM 01/10/2022    1:52 PM 01/09/2021   10:10 AM  CBC  WBC 3.4 - 10.8 x10E3/uL 4.7  4.7  4.3   Hemoglobin 11.1 - 15.9 g/dL 87.8  87.4  87.2   Hematocrit 34.0 - 46.6 % 36.8  38.0  38.5   Platelets 150 - 450 x10E3/uL 198  200  196       CMP     Component Value Date/Time   NA 142 01/12/2023 1349   NA 138 08/22/2013 1432   K 4.1 01/12/2023 1349   K 3.3 (L) 08/22/2013 1432   CL 106 01/12/2023 1349   CL 106 08/22/2013 1432   CO2 25 01/12/2023 1349   CO2 24 08/22/2013 1432   GLUCOSE 103 (H) 01/12/2023 1349   GLUCOSE 99 11/01/2015 1450   GLUCOSE 97 08/22/2013 1432   BUN 12 01/12/2023 1349   BUN 6 (L) 08/22/2013 1432   CREATININE  0.67 01/12/2023 1349   CREATININE 0.62  08/22/2013 1432   CALCIUM 9.0 01/12/2023 1349   CALCIUM 9.5 08/22/2013 1432   PROT 6.4 01/12/2023 1349   PROT 8.8 (H) 08/22/2013 1432   ALBUMIN 3.8 01/12/2023 1349   ALBUMIN 4.3 08/22/2013 1432   AST 18 01/12/2023 1349   AST 25 08/22/2013 1432   ALT 19 01/12/2023 1349   ALT 64 08/22/2013 1432   ALKPHOS 60 01/12/2023 1349   ALKPHOS 52 08/22/2013 1432   BILITOT 0.4 01/12/2023 1349   BILITOT 0.7 08/22/2013 1432   EGFR 105 01/12/2023 1349   GFRNONAA 92 12/26/2019 1447   GFRNONAA >60 08/22/2013 1432     No results found.     Assessment & Plan:   1. Symptomatic varicose veins of both lower extremities (Primary) Patient returns to clinic with bilateral lower extremity swelling to her feet and ankles with painful varicose veins.  She was last seen in our office in 2020 where she started some conservative therapy but did not continue or remain compliant with it.  She underwent bilateral lower extremity venous ultrasounds that were negative for any DVTs or reflux.  Recommend:  I have had a long discussion with the patient regarding swelling and why it  causes symptoms.  Patient will begin wearing graduated compression on a daily basis a prescription was given. The patient will  wear the stockings first thing in the morning and removing them in the evening. The patient is instructed specifically not to sleep in the stockings.   In addition, behavioral modification will be initiated.  This will include frequent elevation, use of over the counter pain medications and exercise such as walking.  Consideration for a lymph pump will also be made based upon the effectiveness of conservative therapy.  This would help to improve the edema control and prevent sequela such as ulcers and infections   Patient completed duplex ultrasound of the venous system revealing no DVT or reflux is present.  The patient will follow-up with me in 6 months after being compliant with conservative therapy.    2.  Schatzki's ring Continue PPI as already ordered, this medication has been reviewed and there are no changes at this time.  Avoidence of caffeine and alcohol  Moderate elevation of the head of the bed    Current Outpatient Medications on File Prior to Visit  Medication Sig Dispense Refill   cetirizine  (ZYRTEC ) 10 MG tablet Take 1 tablet (10 mg total) by mouth daily. 90 tablet 3   cyclobenzaprine  (FLEXERIL ) 10 MG tablet Take 1 tablet (10 mg total) by mouth at bedtime as needed for muscle spasms. 30 tablet 2   diclofenac  Sodium (VOLTAREN ) 1 % GEL Apply 4 g topically 4 (four) times daily. 350 g 2   diphenhydrAMINE  (BENADRYL  ALLERGY) 25 mg capsule Take 1 capsule (25 mg total) by mouth every 6 (six) hours as needed. 30 capsule 0   ketoconazole  (NIZORAL ) 2 % shampoo Apply 2 to 3 times per week, massage into scalp and leave in for 5 minutes before rinsing out 120 mL 11   Magnesium  Glycinate 100 MG CAPS Take 4 capsules by mouth nightly for headaches, sleep and anxiety. 360 capsule 3   montelukast  (SINGULAIR ) 10 MG tablet Take 1 tablet (10 mg total) by mouth at bedtime. (Patient taking differently: Take 10 mg by mouth at bedtime. PRN) 90 tablet 1   nortriptyline  (PAMELOR ) 25 MG capsule Take 1 capsule (25 mg total) by mouth at bedtime. (Patient taking differently:  Take 25 mg by mouth at bedtime. PRN) 180 capsule 1   omeprazole  (PRILOSEC) 40 MG capsule Take 1 capsule (40 mg total) by mouth daily as needed. 90 capsule 1   Riboflavin  (B-2) 100 MG TABS Take 400 mg (4 tablets) by mouth daily for 90 days for headaches. 400 tablet 3   Rimegepant Sulfate  (NURTEC) 75 MG TBDP Dissolve 1 tablet (75 mg total) by mouth daily as needed. 8 tablet 3   SUMAtriptan  (IMITREX ) 25 MG tablet Take 1 tablet (25 mg total) by mouth once daily as needed for migraine. May repeat in 2 hours if headache persists or recurs. (Max 2 tablets per 24 hours) 20 tablet 0   triamcinolone  ointment (KENALOG ) 0.5 % Apply 1 Application topically 2  (two) times daily. 30 g 0   valACYclovir  (VALTREX ) 500 MG tablet Take 1 tablet (500 mg total) by mouth 2 (two) times daily. (Patient taking differently: Take 500 mg by mouth 2 (two) times daily. PRN) 10 tablet 12   ondansetron  (ZOFRAN ) 4 MG tablet Take 1 tablet (4 mg total) by mouth every 8 (eight) hours as needed for nausea or vomiting. (Patient not taking: Reported on 11/27/2023) 20 tablet 0   [DISCONTINUED] albuterol  (VENTOLIN  HFA) 108 (90 Base) MCG/ACT inhaler Inhale 2 puffs into the lungs every 6 (six) hours as needed for wheezing or shortness of breath. (Patient not taking: Reported on 07/03/2020) 18 g 1   No current facility-administered medications on file prior to visit.    There are no Patient Instructions on file for this visit. No follow-ups on file.   Gwendlyn JONELLE Shank, NP

## 2023-12-21 ENCOUNTER — Other Ambulatory Visit: Payer: Self-pay | Admitting: Family Medicine

## 2023-12-21 NOTE — Telephone Encounter (Unsigned)
 Copied from CRM [id number]. Topic: Clinical - Medication Refill >> Dec 21, 2023  5:00 PM Winona R wrote: Medication:valACYclovir  (VALTREX ) 500 MG tablet  Has the patient contacted their pharmacy? No, bottle says no refill (Agent: If no, request that the patient contact the pharmacy for the refill. If patient does not wish to contact the pharmacy document the reason why and proceed with request.) (Agent: If yes, when and what did the pharmacy advise?)  This is the patient's preferred pharmacy:  [facility] [address] [place] [address] Phone: [phone] Fax: [phone]  Is this the correct pharmacy for this prescription? Yes If no, delete pharmacy and type the correct one.   Has the prescription been filled recently? No  Is the patient out of the medication? Yes  Has the patient been seen for an appointment in the last year OR does the patient have an upcoming appointment? {yes/no:[id number]}  Can we respond through MyChart? Yes  Agent: Please be advised that Rx refills may take up to 3 business days. We ask that you follow-up with your pharmacy.

## 2023-12-22 ENCOUNTER — Other Ambulatory Visit: Payer: Self-pay

## 2023-12-22 MED ORDER — VALACYCLOVIR HCL 500 MG PO TABS
500.0000 mg | ORAL_TABLET | Freq: Two times a day (BID) | ORAL | 12 refills | Status: AC
  Filled 2023-12-22: qty 10, 5d supply, fill #0

## 2023-12-31 ENCOUNTER — Other Ambulatory Visit: Payer: Self-pay

## 2023-12-31 ENCOUNTER — Ambulatory Visit (INDEPENDENT_AMBULATORY_CARE_PROVIDER_SITE_OTHER): Admitting: Pediatrics

## 2023-12-31 ENCOUNTER — Ambulatory Visit: Payer: Self-pay

## 2023-12-31 VITALS — BP 105/70 | HR 88 | Ht 64.0 in | Wt 163.4 lb

## 2023-12-31 DIAGNOSIS — G43001 Migraine without aura, not intractable, with status migrainosus: Secondary | ICD-10-CM

## 2023-12-31 DIAGNOSIS — Z23 Encounter for immunization: Secondary | ICD-10-CM

## 2023-12-31 DIAGNOSIS — K649 Unspecified hemorrhoids: Secondary | ICD-10-CM

## 2023-12-31 DIAGNOSIS — K625 Hemorrhage of anus and rectum: Secondary | ICD-10-CM

## 2023-12-31 MED ORDER — HYDROCORTISONE (PERIANAL) 2.5 % EX CREA
1.0000 | TOPICAL_CREAM | Freq: Two times a day (BID) | CUTANEOUS | 0 refills | Status: AC
  Filled 2023-12-31: qty 30, 10d supply, fill #0

## 2023-12-31 MED ORDER — NURTEC 75 MG PO TBDP
75.0000 mg | ORAL_TABLET | Freq: Every day | ORAL | 3 refills | Status: AC | PRN
  Filled 2023-12-31: qty 8, 16d supply, fill #0

## 2023-12-31 NOTE — Telephone Encounter (Signed)
 FYI Only or Action Required?: FYI only for provider.  Patient was last seen in primary care on 09/30/2023 by Valerio Melanie DASEN, NP.  Called Nurse Triage reporting Rectal Bleeding.  Symptoms began about a month ago.  Interventions attempted: Rest, hydration, or home remedies.  Symptoms are: unchanged.  Triage Disposition: See PCP Within 2 Weeks  Patient/caregiver understands and will follow disposition?: Yes Reason for Disposition  [1] Rectal bleeding is minimal (e.g., blood just on toilet paper, few drops, streaks on surface of normal formed BM) AND [2] bleeding recurs 3 or more times after using Care Advice  Answer Assessment - Initial Assessment Questions Pt states she has had a previous vaginal infxn and was taking valtrex  for treatment. States over the last two months she's noticed a scant amount of bleeding to rectum when she wipes after a bowel movement. Has been previously dx with hemorrhoids and is unsure if its related at this time.   1. APPEARANCE of BLOOD: What color is it? Is it passed separately, on the surface of the stool, or mixed in with the stool?      Dark red blood 2. AMOUNT: How much blood was passed?      Scant  3. FREQUENCY: How many times has blood been passed with the stools?      2-3 times daily 4. ONSET: When was the blood first seen in the stools? (Days or weeks)      Two months 5. DIARRHEA: Is there also some diarrhea? If Yes, ask: How many diarrhea stools in the past 24 hours?      occasionally 6. CONSTIPATION: Do you have constipation? If Yes, ask: How bad is it?     denies 7. RECURRENT SYMPTOMS: Have you had blood in your stools before? If Yes, ask: When was the last time? and What happened that time?      Hx hemorrhoids; denies bloody sstool 8. BLOOD THINNERS: Do you take any blood thinners? (e.g., aspirin, clopidogrel / Plavix, coumadin, heparin). Notes: Other strong blood thinners include: Arixtra (fondaparinux), Eliquis  (apixaban), Pradaxa (dabigatran), and Xarelto (rivaroxaban).     denies 9. OTHER SYMPTOMS: Do you have any other symptoms?  (e.g., abdomen pain, vomiting, dizziness, fever)     Admits to occasional episode of dizziness at work  Protocols used: Rectal [employer]

## 2023-12-31 NOTE — Progress Notes (Signed)
 Office Visit  BP 105/70 (BP Location: Left Arm, Patient Position: Sitting, Cuff Size: Large)   Pulse 88   Ht 5' 4 (1.626 m)   Wt 163 lb 6.4 oz (74.1 kg)   SpO2 96%   BMI 28.05 kg/m    Subjective:    Patient ID: Karen Leala GAILS., female    DOB: [birth year], 53 y.o.   MRN: [id number]  HPI: Karen Walker is a 53 y.o. female  Chief Complaint  Patient presents with   Rectal Bleeding    2 months, spotting, sometimes spots of blood drops in toilet, bleeding is with urination and pooping, a little pain, constant bleeding   Hemorrhoids   Medication Refill    nurtec   Due to language barrier, certified spanish speaking provider was present during the history-taking, physical exam and subsequent discussion with this patient.  Discussed the use of AI scribe software for clinical note transcription with the patient, who gave verbal consent to proceed.  History of Present Illness   Karen Walker is a 53 year old female who presents with rectal bleeding.  She has been experiencing rectal bleeding for the past two months, occurring almost daily. Despite taking Valtrex , the bleeding has not completely resolved, although there has been some improvement. She has a history of hemorrhoids, confirmed during a colonoscopy in 2022.  She frequently experiences constipation. She has mild abdominal pain and maintains moderate hydration.      Relevant past medical, surgical, family and social history reviewed and updated as indicated. Interim medical history since our last visit reviewed. Allergies and medications reviewed and updated.  ROS per HPI unless specifically indicated above     Objective:    BP 105/70 (BP Location: Left Arm, Patient Position: Sitting, Cuff Size: Large)   Pulse 88   Ht 5' 4 (1.626 m)   Wt 163 lb 6.4 oz (74.1 kg)   SpO2 96%   BMI 28.05 kg/m   Wt Readings from Last 3 Encounters:  12/31/23 163 lb 6.4 oz (74.1 kg)  11/27/23 163 lb (73.9 kg)   09/30/23 170 lb (77.1 kg)     Physical Exam Constitutional:      Appearance: Normal appearance.  Pulmonary:     Effort: Pulmonary effort is normal.  Abdominal:     General: Abdomen is flat.     Palpations: Abdomen is soft.     Tenderness: There is no abdominal tenderness.  Musculoskeletal:        General: Normal range of motion.  Skin:    Comments: Normal skin color  Neurological:     General: No focal deficit present.     Mental Status: She is alert. Mental status is at baseline.  Psychiatric:        Mood and Affect: Mood normal.        Behavior: Behavior normal.        Thought Content: Thought content normal.         09/21/2023    3:48 PM 06/03/2023    9:26 AM 05/26/2023    3:51 PM 01/12/2023    1:45 PM 01/01/2023    3:55 PM  Depression screen PHQ 2/9  Decreased Interest 1 0 1 1 1   Down, Depressed, Hopeless 1 0 1 1 1   PHQ - 2 Score 2 0 2 2 2   Altered sleeping 1  1 1 1   Tired, decreased energy 1  1 1 1   Change in appetite 1  1 1  0  Feeling bad or failure about yourself  1  1 1  0  Trouble concentrating 1  1 1  0  Moving slowly or fidgety/restless 1  1 1  0  Suicidal thoughts 1  1 1  0  PHQ-9 Score 9  9 9 4   Difficult doing work/chores   Somewhat difficult Not difficult at all Somewhat difficult       09/21/2023    3:48 PM 06/03/2023    9:26 AM 05/26/2023    3:52 PM 01/12/2023    1:45 PM  GAD 7 : Generalized Anxiety Score  Nervous, Anxious, on Edge 1 0 1 1  Control/stop worrying 1 0 1 1  Worry too much - different things 1 0 1 1  Trouble relaxing 1 0 1 1  Restless 1 0 1 1  Easily annoyed or irritable 1 0 1 1  Afraid - awful might happen 1 0 1 1  Total GAD 7 Score 7 0 7 7  Anxiety Difficulty  Not difficult at all Not difficult at all Not difficult at all       Assessment & Plan:  Assessment & Plan   Rectal bleeding Rectal bleeding for two months, likely from hemorrhoids. Previous colonoscopy confirmed hemorrhoids. Frequency of bleeding may require specialist  intervention. - Refer to specialist for potential intervention if dietary changes are insufficient. - Prescribe Anusol  cream 2-3 times daily after bowel movements. -     Hydrocortisone  (Perianal); Place 1 Application rectally 2 (two) times daily.  Dispense: 30 g; Refill: 0 -     Ambulatory referral to Gastroenterology  Hemorrhoids, unspecified hemorrhoid type Frequent constipation exacerbating hemorrhoid symptoms. - Recommend high-fiber diet. - Advise increased hydration.  -     Hydrocortisone  (Perianal); Place 1 Application rectally 2 (two) times daily.  Dispense: 30 g; Refill: 0 -     Ambulatory referral to Gastroenterology  Need for vaccination -     Flu vaccine trivalent PF, 6mos and older(Flulaval,Afluria,Fluarix,Fluzone)  Migraine without aura and with status migrainosus, not intractable Requesting refills. -     Nurtec; Dissolve 1 tablet (75 mg total) by mouth daily as needed.  Dispense: 8 tablet; Refill: 3   Follow up plan: Return if symptoms worsen or fail to improve.  Karen SHAUNNA Nett, MD

## 2024-01-01 ENCOUNTER — Other Ambulatory Visit: Payer: Self-pay

## 2024-01-05 ENCOUNTER — Encounter: Payer: Self-pay | Admitting: Pediatrics

## 2024-01-08 ENCOUNTER — Telehealth: Payer: Self-pay

## 2024-01-08 NOTE — Telephone Encounter (Signed)
 Copied from CRM #[id number]. Topic: General - Other >> Jan 08, 2024  2:43 PM Karen Walker wrote: Reason for CRM: Patient is calling to see if she can get proof of her flu shot immunization printed off today. Please advise.  Call back # [phone]

## 2024-01-08 NOTE — Telephone Encounter (Addendum)
 Patient has picked up the form as requested.

## 2024-01-14 ENCOUNTER — Other Ambulatory Visit: Payer: Self-pay

## 2024-01-14 ENCOUNTER — Ambulatory Visit (INDEPENDENT_AMBULATORY_CARE_PROVIDER_SITE_OTHER): Admitting: Family Medicine

## 2024-01-14 ENCOUNTER — Encounter: Payer: Self-pay | Admitting: Family Medicine

## 2024-01-14 VITALS — BP 95/62 | HR 87 | Temp 98.0°F | Ht 64.0 in | Wt 165.4 lb

## 2024-01-14 DIAGNOSIS — K219 Gastro-esophageal reflux disease without esophagitis: Secondary | ICD-10-CM

## 2024-01-14 DIAGNOSIS — Z23 Encounter for immunization: Secondary | ICD-10-CM | POA: Diagnosis not present

## 2024-01-14 DIAGNOSIS — M4722 Other spondylosis with radiculopathy, cervical region: Secondary | ICD-10-CM | POA: Diagnosis not present

## 2024-01-14 DIAGNOSIS — Z1231 Encounter for screening mammogram for malignant neoplasm of breast: Secondary | ICD-10-CM

## 2024-01-14 DIAGNOSIS — G43001 Migraine without aura, not intractable, with status migrainosus: Secondary | ICD-10-CM

## 2024-01-14 DIAGNOSIS — Z0000 Encounter for general adult medical examination without abnormal findings: Secondary | ICD-10-CM

## 2024-01-14 MED ORDER — CYCLOBENZAPRINE HCL 10 MG PO TABS
10.0000 mg | ORAL_TABLET | Freq: Every evening | ORAL | 2 refills | Status: AC | PRN
  Filled 2024-01-14 – 2024-01-29 (×2): qty 30, 30d supply, fill #0

## 2024-01-14 MED ORDER — NORTRIPTYLINE HCL 25 MG PO CAPS
25.0000 mg | ORAL_CAPSULE | Freq: Every day | ORAL | 1 refills | Status: AC
  Filled 2024-01-14 – 2024-01-29 (×2): qty 90, 90d supply, fill #0

## 2024-01-14 MED ORDER — DICLOFENAC SODIUM 1 % EX GEL
4.0000 g | Freq: Four times a day (QID) | CUTANEOUS | 2 refills | Status: AC
  Filled 2024-01-14: qty 400, 27d supply, fill #0
  Filled 2024-01-29: qty 100, 7d supply, fill #0

## 2024-01-14 MED ORDER — CETIRIZINE HCL 10 MG PO TABS
10.0000 mg | ORAL_TABLET | Freq: Every day | ORAL | 3 refills | Status: AC
Start: 2024-01-14 — End: ?
  Filled 2024-01-14 – 2024-01-29 (×2): qty 90, 90d supply, fill #0

## 2024-01-14 MED ORDER — MONTELUKAST SODIUM 10 MG PO TABS
10.0000 mg | ORAL_TABLET | Freq: Every day | ORAL | 1 refills | Status: AC
  Filled 2024-01-14 – 2024-01-29 (×2): qty 90, 90d supply, fill #0

## 2024-01-14 MED ORDER — OMEPRAZOLE 40 MG PO CPDR
40.0000 mg | DELAYED_RELEASE_CAPSULE | Freq: Every day | ORAL | 1 refills | Status: DC | PRN
  Filled 2024-01-14 – 2024-01-29 (×2): qty 90, 90d supply, fill #0

## 2024-01-14 MED ORDER — SUMATRIPTAN SUCCINATE 25 MG PO TABS
25.0000 mg | ORAL_TABLET | ORAL | 0 refills | Status: AC
  Filled 2024-01-14: qty 9, 30d supply, fill #0
  Filled 2024-01-29: qty 9, 15d supply, fill #0

## 2024-01-14 NOTE — Progress Notes (Signed)
 BP 95/62   Pulse 87   Temp 98 F (36.7 C) (Oral)   Ht 5' 4 (1.626 m)   Wt 165 lb 6 oz (75 kg)   SpO2 97%   BMI 28.39 kg/m    Subjective:    Patient ID: Karen Walker., female    DOB: 28-Apr-1970, 53 y.o.   MRN: [id number]  HPI: Karen Walker is a 53 y.o. female presenting on 01/14/2024 for comprehensive medical examination. Current medical complaints include:none  She currently lives with: husband and kids Menopausal Symptoms: no  Depression Screen done today and results listed below:     01/14/2024    2:45 PM 09/21/2023    3:48 PM 06/03/2023    9:26 AM 05/26/2023    3:51 PM 01/12/2023    1:45 PM  Depression screen PHQ 2/9  Decreased Interest 0 1 0 1 1  Down, Depressed, Hopeless 0 1 0 1 1  PHQ - 2 Score 0 2 0 2 2  Altered sleeping 0 1  1 1   Tired, decreased energy 0 1  1 1   Change in appetite 0 1  1 1   Feeling bad or failure about yourself  0 1  1 1   Trouble concentrating 0 1  1 1   Moving slowly or fidgety/restless 0 1  1 1   Suicidal thoughts 0 1  1 1   PHQ-9 Score 0 9  9 9   Difficult doing work/chores    Somewhat difficult Not difficult at all    Past Medical History:  Past Medical History:  Diagnosis Date   Allergic rhinitis    Anxiety    Breast lump on right side at 8 o'clock position 12/06/2014   Depression    Dysplastic nevus 02/14/2020   Mod to severe - close to margin R foot inf to her med maleolus   Dysplastic nevus 08/07/2022   L middle medical calf - moderate to severe, recheck at next visit   History of skin cancer    Hx of migraine headaches    light sensivity, unilateral HA   Plantar fasciitis 09/06/2019   Urticaria     Surgical History:  Past Surgical History:  Procedure Laterality Date   BREAST CYST ASPIRATION Right 2013   negative. Done at Dr. Fredirick office   COLONOSCOPY WITH PROPOFOL  N/A 07/20/2020   Procedure: COLONOSCOPY WITH PROPOFOL ;  Surgeon: Janalyn Keene NOVAK, MD;  Location: [facility];  Service: Endoscopy;  Laterality:  N/A;  SPANISH INTERPRETER   ESOPHAGOGASTRODUODENOSCOPY (EGD) WITH PROPOFOL  N/A 07/20/2020   Procedure: ESOPHAGOGASTRODUODENOSCOPY (EGD) WITH PROPOFOL ;  Surgeon: Janalyn Keene NOVAK, MD;  Location: [facility]COPY;  Service: Endoscopy;  Laterality: N/A;   HERNIA REPAIR  2014   hysterectemy     melonoma removal on R medial arch of foot  2008   TOTAL ABDOMINAL HYSTERECTOMY W/ BILATERAL SALPINGOOPHORECTOMY  2014   TOTAL ABDOMINAL HYSTERECTOMY W/ BILATERAL SALPINGOOPHORECTOMY     UMBILICAL HERNIA REPAIR     occurred with hysterectemy    Medications:  Current Outpatient Medications on File Prior to Visit  Medication Sig   diphenhydrAMINE  (BENADRYL  ALLERGY) 25 mg capsule Take 1 capsule (25 mg total) by mouth every 6 (six) hours as needed.   hydrocortisone  (ANUSOL -HC) 2.5 % rectal cream Place 1 Application rectally 2 (two) times daily.   ketoconazole  (NIZORAL ) 2 % shampoo Apply 2 to 3 times per week, massage into scalp and leave in for 5 minutes before rinsing out   Magnesium  Glycinate 100 MG CAPS Take 4  capsules by mouth nightly for headaches, sleep and anxiety.   ondansetron  (ZOFRAN ) 4 MG tablet Take 1 tablet (4 mg total) by mouth every 8 (eight) hours as needed for nausea or vomiting.   Riboflavin  (B-2) 100 MG TABS Take 400 mg (4 tablets) by mouth daily for 90 days for headaches.   Rimegepant Sulfate  (NURTEC) 75 MG TBDP Dissolve 1 tablet (75 mg total) by mouth daily as needed.   triamcinolone  ointment (KENALOG ) 0.5 % Apply 1 Application topically 2 (two) times daily.   valACYclovir  (VALTREX ) 500 MG tablet Take 1 tablet (500 mg total) by mouth 2 (two) times daily as needed   [DISCONTINUED] albuterol  (VENTOLIN  HFA) 108 (90 Base) MCG/ACT inhaler Inhale 2 puffs into the lungs every 6 (six) hours as needed for wheezing or shortness of breath. (Patient not taking: Reported on 07/03/2020)   No current facility-administered medications on file prior to visit.    Allergies:  Allergies  Allergen  Reactions   Codeine Other (See Comments)   Pollen Extract     Social History:  Social History   Socioeconomic History   Marital status: Married    Spouse name: Not on file   Number of children: Not on file   Years of education: Not on file   Highest education level: Not on file  Occupational History   Not on file  Tobacco Use   Smoking status: Never   Smokeless tobacco: Never  Vaping Use   Vaping status: Never Used  Substance and Sexual Activity   Alcohol use: No   Drug use: No   Sexual activity: Yes    Birth control/protection: Surgical  Other Topics Concern   Not on file  Social History Narrative   Not on file   Social Drivers of Health   Financial Resource Strain: Not on file  Food Insecurity: Not on file  Transportation Needs: Not on file  Physical Activity: Not on file  Stress: Not on file  Social Connections: Not on file  Intimate Partner Violence: Not on file   Social History   Tobacco Use  Smoking Status Never  Smokeless Tobacco Never   Social History   Substance and Sexual Activity  Alcohol Use No    Family History:  Family History  Problem Relation Age of Onset   Stroke Father    Asthma Daughter    Urticaria Son    Food Allergy Son        red dye, dairy   Allergic rhinitis Neg Hx    Angioedema Neg Hx    Eczema Neg Hx     Past medical history, surgical history, medications, allergies, family history and social history reviewed with patient today and changes made to appropriate areas of the chart.   Review of Systems  Constitutional: Negative.   HENT: Negative.    Eyes: Negative.   Respiratory: Negative.    Cardiovascular: Negative.   Gastrointestinal: Negative.   Genitourinary: Negative.   Musculoskeletal: Negative.   Skin: Negative.   Neurological:  Positive for dizziness. Negative for tingling, tremors, sensory change, speech change, focal weakness, seizures, loss of consciousness, weakness and headaches.  Endo/Heme/Allergies:   Negative for environmental allergies and polydipsia. Bruises/bleeds easily.  Psychiatric/Behavioral: Negative.     All other ROS negative except what is listed above and in the HPI.      Objective:    BP 95/62   Pulse 87   Temp 98 F (36.7 C) (Oral)   Ht 5' 4 (1.626 m)  Wt 165 lb 6 oz (75 kg)   SpO2 97%   BMI 28.39 kg/m   Wt Readings from Last 3 Encounters:  01/14/24 165 lb 6 oz (75 kg)  12/31/23 163 lb 6.4 oz (74.1 kg)  11/27/23 163 lb (73.9 kg)    Physical Exam Vitals and nursing note reviewed.  Constitutional:      General: She is not in acute distress.    Appearance: Normal appearance. She is normal weight. She is not ill-appearing, toxic-appearing or diaphoretic.  HENT:     Head: Normocephalic and atraumatic.     Right Ear: Tympanic membrane, ear canal and external ear normal. There is no impacted cerumen.     Left Ear: Tympanic membrane, ear canal and external ear normal. There is no impacted cerumen.     Nose: Nose normal. No congestion or rhinorrhea.     Mouth/Throat:     Mouth: Mucous membranes are moist.     Pharynx: Oropharynx is clear. No oropharyngeal exudate or posterior oropharyngeal erythema.  Eyes:     General: No scleral icterus.       Right eye: No discharge.        Left eye: No discharge.     Extraocular Movements: Extraocular movements intact.     Conjunctiva/sclera: Conjunctivae normal.     Pupils: Pupils are equal, round, and reactive to light.  Neck:     Vascular: No carotid bruit.  Cardiovascular:     Rate and Rhythm: Normal rate and regular rhythm.     Pulses: Normal pulses.     Heart sounds: No murmur heard.    No friction rub. No gallop.  Pulmonary:     Effort: Pulmonary effort is normal. No respiratory distress.     Breath sounds: Normal breath sounds. No stridor. No wheezing, rhonchi or rales.  Chest:     Chest wall: No tenderness.  Abdominal:     General: Abdomen is flat. Bowel sounds are normal. There is no distension.      Palpations: Abdomen is soft. There is no mass.     Tenderness: There is no abdominal tenderness. There is no right CVA tenderness, left CVA tenderness, guarding or rebound.     Hernia: No hernia is present.  Genitourinary:    Comments: Breast and pelvic exams deferred with shared decision making Musculoskeletal:        General: No swelling, tenderness, deformity or signs of injury.     Cervical back: Normal range of motion and neck supple. No rigidity. No muscular tenderness.     Right lower leg: No edema.     Left lower leg: No edema.  Lymphadenopathy:     Cervical: No cervical adenopathy.  Skin:    General: Skin is warm and dry.     Capillary Refill: Capillary refill takes less than 2 seconds.     Coloration: Skin is not jaundiced or pale.     Findings: No bruising, erythema, lesion or rash.  Neurological:     General: No focal deficit present.     Mental Status: She is alert and oriented to person, place, and time. Mental status is at baseline.     Cranial Nerves: No cranial nerve deficit.     Sensory: No sensory deficit.     Motor: No weakness.     Coordination: Coordination normal.     Gait: Gait normal.     Deep Tendon Reflexes: Reflexes normal.  Psychiatric:        Mood and  Affect: Mood normal.        Behavior: Behavior normal.        Thought Content: Thought content normal.        Judgment: Judgment normal.     Results for orders placed or performed in visit on 09/21/23  RA Qn+CCP(IgG/A)+SjoSSA+SjoSSB   Collection Time: 09/21/23  4:32 PM  Result Value Ref Range   Rheumatoid fact SerPl-aCnc <10.0 <14.0 IU/mL   ENA SSA (RO) Ab <0.2 0.0 - 0.9 AI   ENA SSB (LA) Ab <0.2 0.0 - 0.9 AI   Cyclic Citrullin Peptide Ab 7 0 - 19 units  ANA 12 Plus Profile (RDL)   Collection Time: 09/21/23  4:32 PM  Result Value Ref Range   Anti-Nuclear Ab by IFA (RDL) Negative Negative  C-reactive protein   Collection Time: 09/21/23  4:32 PM  Result Value Ref Range   CRP 1 0 - 10 mg/L   Sed Rate (ESR)   Collection Time: 09/21/23  4:32 PM  Result Value Ref Range   Sed Rate 8 0 - 40 mm/hr  Anti-Ro/Neg ANA   Collection Time: 09/21/23  4:32 PM  Result Value Ref Range   Anti-Ro (SS-A) Ab (RDL) <20 <20 Units      Assessment & Plan:   Problem List Items Addressed This Visit       Cardiovascular and Mediastinum   Migraine without aura and with status migrainosus, not intractable   Relevant Medications   cyclobenzaprine  (FLEXERIL ) 10 MG tablet   nortriptyline  (PAMELOR ) 25 MG capsule   SUMAtriptan  (IMITREX ) 25 MG tablet     Nervous and Auditory   Osteoarthritis of spine with radiculopathy, cervical region   Relevant Medications   cyclobenzaprine  (FLEXERIL ) 10 MG tablet   nortriptyline  (PAMELOR ) 25 MG capsule   Other Visit Diagnoses       Routine general medical examination at a health care facility    -  Primary   Vaccines up to date. Screening labs checked today. Pap and colonoscopy up to date. Mammo ordreed. Continue diet and exercise. Call with any concerns.   Relevant Orders   CBC with Differential/Platelet   Comprehensive metabolic panel with GFR   Lipid Panel w/o Chol/HDL Ratio   TSH   Hepatitis B surface antibody,quantitative     Gastroesophageal reflux disease without esophagitis       Under good control on current regimen. Continue current regimen. Continue to monitor. Call with any concerns. Refills given. Labs drawn today.   Relevant Medications   omeprazole  (PRILOSEC) 40 MG capsule     Need for pneumococcal 20-valent conjugate vaccination       Prevnar 20 given today.   Relevant Orders   Pneumococcal conjugate vaccine 20-valent (Prevnar 20)     Encounter for screening mammogram for malignant neoplasm of breast       Mammo ordered today.   Relevant Orders   MM 3D SCREENING MAMMOGRAM BILATERAL BREAST        Follow up plan: Return in about 1 year (around 01/13/2025) for physical.   LABORATORY TESTING:  - Pap smear: not  applicable  IMMUNIZATIONS:   - Tdap: Tetanus vaccination status reviewed: last tetanus booster within 10 years. - Influenza: Up to date - Prevnar: Administered today - COVID: Refused - HPV: Not applicable - Shingrix  vaccine: Up to date  SCREENING: -Mammogram: Ordered today  - Colonoscopy: Up to date   PATIENT COUNSELING:   Advised to take 1 mg of folate supplement per day if capable of  pregnancy.   Sexuality: Discussed sexually transmitted diseases, partner selection, use of condoms, avoidance of unintended pregnancy  and contraceptive alternatives.   Advised to avoid cigarette smoking.  I discussed with the patient that most people either abstain from alcohol or drink within safe limits (<=14/week and <=4 drinks/occasion for males, <=7/weeks and <= 3 drinks/occasion for females) and that the risk for alcohol disorders and other health effects rises proportionally with the number of drinks per week and how often a drinker exceeds daily limits.  Discussed cessation/primary prevention of drug use and availability of treatment for abuse.   Diet: Encouraged to adjust caloric intake to maintain  or achieve ideal body weight, to reduce intake of dietary saturated fat and total fat, to limit sodium intake by avoiding high sodium foods and not adding table salt, and to maintain adequate dietary potassium and calcium preferably from fresh fruits, vegetables, and low-fat dairy products.    stressed the importance of regular exercise  Injury prevention: Discussed safety belts, safety helmets, smoke detector, smoking near bedding or upholstery.   Dental health: Discussed importance of regular tooth brushing, flossing, and dental visits.    NEXT PREVENTATIVE PHYSICAL DUE IN 1 YEAR. Return in about 1 year (around 01/13/2025) for physical.

## 2024-01-15 LAB — LIPID PANEL W/O CHOL/HDL RATIO

## 2024-01-16 LAB — CBC WITH DIFFERENTIAL/PLATELET
Basophils Absolute: 0 x10E3/uL (ref 0.0–0.2)
Basos: 0 %
EOS (ABSOLUTE): 0.2 x10E3/uL (ref 0.0–0.4)
Eos: 3 %
Hematocrit: 37.7 % (ref 34.0–46.6)
Hemoglobin: 12.2 g/dL (ref 11.1–15.9)
Immature Grans (Abs): 0 x10E3/uL (ref 0.0–0.1)
Immature Granulocytes: 0 %
Lymphocytes Absolute: 2.2 x10E3/uL (ref 0.7–3.1)
Lymphs: 40 %
MCH: 29.5 pg (ref 26.6–33.0)
MCHC: 32.4 g/dL (ref 31.5–35.7)
MCV: 91 fL (ref 79–97)
Monocytes Absolute: 0.3 x10E3/uL (ref 0.1–0.9)
Monocytes: 5 %
Neutrophils Absolute: 2.9 x10E3/uL (ref 1.4–7.0)
Neutrophils: 52 %
Platelets: 239 x10E3/uL (ref 150–450)
RBC: 4.13 x10E6/uL (ref 3.77–5.28)
RDW: 12.6 % (ref 11.7–15.4)
WBC: 5.6 x10E3/uL (ref 3.4–10.8)

## 2024-01-16 LAB — COMPREHENSIVE METABOLIC PANEL WITH GFR
ALT: 28 IU/L (ref 0–32)
AST: 21 IU/L (ref 0–40)
Albumin: 4.2 g/dL (ref 3.8–4.9)
Alkaline Phosphatase: 59 IU/L (ref 49–135)
BUN/Creatinine Ratio: 27 — ABNORMAL HIGH (ref 9–23)
BUN: 17 mg/dL (ref 6–24)
Bilirubin Total: 0.3 mg/dL (ref 0.0–1.2)
CO2: 23 mmol/L (ref 20–29)
Calcium: 9 mg/dL (ref 8.7–10.2)
Chloride: 105 mmol/L (ref 96–106)
Creatinine, Ser: 0.63 mg/dL (ref 0.57–1.00)
Globulin, Total: 2.4 g/dL (ref 1.5–4.5)
Glucose: 98 mg/dL (ref 70–99)
Potassium: 3.6 mmol/L (ref 3.5–5.2)
Sodium: 142 mmol/L (ref 134–144)
Total Protein: 6.6 g/dL (ref 6.0–8.5)
eGFR: 106 mL/min/1.73 (ref >59)

## 2024-01-16 LAB — TSH: TSH: 2.18 u[IU]/mL (ref 0.450–4.500)

## 2024-01-16 LAB — LIPID PANEL W/O CHOL/HDL RATIO
Cholesterol, Total: 181 mg/dL (ref 100–199)
HDL: 50 mg/dL (ref >39)
LDL Chol Calc (NIH): 100 mg/dL — ABNORMAL HIGH (ref 0–99)
Triglycerides: 181 mg/dL — ABNORMAL HIGH (ref 0–149)
VLDL Cholesterol Cal: 31 mg/dL (ref 5–40)

## 2024-01-16 LAB — HEPATITIS B SURFACE ANTIBODY, QUANTITATIVE: Hepatitis B Surf Ab Quant: <3.5 m[IU]/mL — ABNORMAL LOW

## 2024-01-21 ENCOUNTER — Ambulatory Visit: Payer: Self-pay | Admitting: Family Medicine

## 2024-01-26 ENCOUNTER — Other Ambulatory Visit: Payer: Self-pay

## 2024-01-29 ENCOUNTER — Other Ambulatory Visit: Payer: Self-pay

## 2024-01-29 ENCOUNTER — Ambulatory Visit
Admission: RE | Admit: 2024-01-29 | Discharge: 2024-01-29 | Disposition: A | Discharge status: Home / Self Care | Source: Ambulatory Visit | Attending: Family Medicine | Admitting: Family Medicine

## 2024-01-29 ENCOUNTER — Encounter

## 2024-01-29 DIAGNOSIS — Z1231 Encounter for screening mammogram for malignant neoplasm of breast: Secondary | ICD-10-CM

## 2024-02-11 ENCOUNTER — Ambulatory Visit: Payer: Self-pay

## 2024-02-11 NOTE — Telephone Encounter (Signed)
  FYI Only or Action Required?: FYI only for provider: appointment scheduled on 02/16/2024.  Patient was last seen in primary care on 01/14/2024 by Vicci Bouchard P, DO.  Called Nurse Triage reporting Rectal Bleeding.  Symptoms began several months ago.  Interventions attempted: Rest, hydration, or home remedies and Dietary changes.  Symptoms are: gradually worsening.  Triage Disposition: See PCP Within 2 Weeks  Patient/caregiver understands and will follow disposition?: Yes           Copied from CRM #[id number]. Topic: Clinical - Red Word Triage >> Feb 11, 2024  3:30 PM Hadassah PARAS wrote: Red Word that prompted transfer to Nurse Triage: f/u w Dr. Vicci for rectal bleeding , DT will not allow me to schedule as condition is worsening Reason for Disposition  Rectal bleeding is a chronic symptom (recurrent or ongoing AND present > 4 weeks)  Answer Assessment - Initial Assessment Questions 1. APPEARANCE of BLOOD: What color is it? Is it passed separately, on the surface of the stool, or mixed in with the stool?      Sees blood when wiping and mixed in stool 2. AMOUNT: How much blood was passed?      Moderate amount 3. FREQUENCY: How many times has blood been passed with the stools?      One episode a day  4. ONSET: When was the blood first seen in the stools? (Days or weeks)      About 2 months ago  5. DIARRHEA: Is there also some diarrhea? If Yes, ask: How many diarrhea stools in the past 24 hours?      She states she has diarrhea occasionally  6. CONSTIPATION: Do you have constipation? If Yes, ask: How bad is it?     Yes,currently having constipation   7. RECURRENT SYMPTOMS: Have you had blood in your stools before? If Yes, ask: When was the last time? and What happened that time?      Yes, it is worsening  8. BLOOD THINNERS: Do you take any blood thinners? (e.g., aspirin, clopidogrel / Plavix, coumadin, heparin). Notes: Other strong blood thinners  include: Arixtra (fondaparinux), Eliquis (apixaban), Pradaxa (dabigatran), and Xarelto (rivaroxaban).     Denies  9. OTHER SYMPTOMS: Do you have any other symptoms?  (e.g., abdomen pain, vomiting, dizziness, fever)     Increasing Rash between buttocks area  Protocols used: Rectal Bleeding-A-AH

## 2024-02-16 ENCOUNTER — Other Ambulatory Visit: Payer: Self-pay

## 2024-02-16 ENCOUNTER — Encounter: Payer: Self-pay | Admitting: Pediatrics

## 2024-02-16 ENCOUNTER — Ambulatory Visit: Admitting: Pediatrics

## 2024-02-16 VITALS — BP 95/64 | HR 92 | Resp 16 | Ht 64.0 in | Wt 166.0 lb

## 2024-02-16 DIAGNOSIS — B354 Tinea corporis: Secondary | ICD-10-CM

## 2024-02-16 DIAGNOSIS — R1011 Right upper quadrant pain: Secondary | ICD-10-CM | POA: Diagnosis not present

## 2024-02-16 DIAGNOSIS — B379 Candidiasis, unspecified: Secondary | ICD-10-CM

## 2024-02-16 MED ORDER — KETOCONAZOLE 2 % EX CREA
1.0000 | TOPICAL_CREAM | Freq: Every day | CUTANEOUS | 0 refills | Status: AC
  Filled 2024-02-16 (×2): qty 15, 15d supply, fill #0

## 2024-02-16 NOTE — Patient Instructions (Signed)
 Para ver al especialista, llamar: [phone]

## 2024-02-16 NOTE — Progress Notes (Addendum)
 Office Visit  BP 95/64   Pulse 92   Resp 16   Ht 5' 4 (1.626 m)   Wt 166 lb (75.3 kg)   SpO2 97%   BMI 28.49 kg/m    Subjective:    Patient ID: Karen Leala GAILS., female    DOB: 20-Aug-1970, 53 y.o.   MRN: [id number]  HPI: Karen Walker is a 53 y.o. female  Chief Complaint  Patient presents with   Rectal Bleeding    x1 month- was seen for it in Sept. Area irritated and not getting better   Due to language barrier, certified spanish speaking provider was present during the history-taking, physical exam and subsequent discussion with this patient.  Discussed the use of AI scribe software for clinical note transcription with the patient, who gave verbal consent to proceed.  History of Present Illness   Karen Walker is a 53 year old female who presents with persistent skin rash and gastrointestinal symptoms.  She has a persistent skin rash characterized by multiple welts that initially appeared around the rectal area and have since spread upwards. The rash sometimes burns, especially when exposed to sunlight, and causes mild discomfort but does not bleed. She previously used a prescribed cream which helped reduce inflammation slightly.  She experiences gastrointestinal symptoms, currently having loose stools or diarrhea about once a day, which is a change from her previous constipation. There is no presence of blood in the stool.  She describes intermittent pain in the area of her right rib, present for approximately three weeks. She confirms that she still has her gallbladder.     Relevant past medical, surgical, family and social history reviewed and updated as indicated. Interim medical history since our last visit reviewed. Allergies and medications reviewed and updated.  ROS per HPI unless specifically indicated above     Objective:    BP 95/64   Pulse 92   Resp 16   Ht 5' 4 (1.626 m)   Wt 166 lb (75.3 kg)   SpO2 97%   BMI 28.49 kg/m   Wt  Readings from Last 3 Encounters:  02/16/24 166 lb (75.3 kg)  01/14/24 165 lb 6 oz (75 kg)  12/31/23 163 lb 6.4 oz (74.1 kg)     Physical Exam Constitutional:      Appearance: Normal appearance.  Pulmonary:     Effort: Pulmonary effort is normal.  Abdominal:     General: Abdomen is flat.     Palpations: Abdomen is soft.     Tenderness: There is no abdominal tenderness. There is no guarding.  Genitourinary:    Comments: Satellite lesions along gluteal crease Musculoskeletal:        General: Normal range of motion.  Skin:    Comments: Normal skin color  Neurological:     General: No focal deficit present.     Mental Status: She is alert. Mental status is at baseline.  Psychiatric:        Mood and Affect: Mood normal.        Behavior: Behavior normal.        Thought Content: Thought content normal.         02/16/2024    3:28 PM 01/14/2024    2:45 PM 09/21/2023    3:48 PM 06/03/2023    9:26 AM 05/26/2023    3:51 PM  Depression screen PHQ 2/9  Decreased Interest 0 0 1 0 1  Down, Depressed, Hopeless 0 0 1 0  1  PHQ - 2 Score 0 0 2 0 2  Altered sleeping 0 0 1  1  Tired, decreased energy 0 0 1  1  Change in appetite 0 0 1  1  Feeling bad or failure about yourself  0 0 1  1  Trouble concentrating 0 0 1  1  Moving slowly or fidgety/restless 0 0 1  1  Suicidal thoughts 0 0 1  1  PHQ-9 Score 0 0  9   9   Difficult doing work/chores     Somewhat difficult     Data saved with a previous flowsheet row definition       02/16/2024    3:28 PM 01/14/2024    2:46 PM 09/21/2023    3:48 PM 06/03/2023    9:26 AM  GAD 7 : Generalized Anxiety Score  Nervous, Anxious, on Edge 0 0 1 0  Control/stop worrying 0 0 1 0  Worry too much - different things 0 0 1 0  Trouble relaxing 0 0 1 0  Restless 0 0 1 0  Easily annoyed or irritable 0 0 1 0  Afraid - awful might happen 0 0 1 0  Total GAD 7 Score 0 0 7 0  Anxiety Difficulty    Not difficult at all       Assessment & Plan:   Assessment & Plan   RUQ pain Intermittent right upper quadrant pain, possibly hepatobiliary or muscular. Exam reassuring. Bowel habits alternating between constipation and diarrhea, occurring once daily without blood. GI eval pending. No recent episodes but now has discomfort below. Will get repat labs.  -     Comprehensive metabolic panel with GFR  Candida infection Rash likely due to cutaneous candidiasis, possibly secondary to hemorrhoid treatment. - Prescribed antifungal cream, apply once daily at bedtime. - Advised to report if no improvement after a few days. -     Ketoconazole ; Apply 1 Application topically at bedtime for 14 days.  Dispense: 15 g; Refill: 0   Follow up plan: Return in about 15 days (around 03/02/2024).  Karen SHAUNNA Nett, MD

## 2024-02-17 ENCOUNTER — Other Ambulatory Visit: Payer: Self-pay

## 2024-02-17 LAB — COMPREHENSIVE METABOLIC PANEL WITH GFR
ALT: 21 IU/L (ref 0–32)
AST: 24 IU/L (ref 0–40)
Albumin: 4.1 g/dL (ref 3.8–4.9)
Alkaline Phosphatase: 62 IU/L (ref 49–135)
BUN/Creatinine Ratio: 22 (ref 9–23)
BUN: 19 mg/dL (ref 6–24)
Bilirubin Total: 0.3 mg/dL (ref 0.0–1.2)
CO2: 24 mmol/L (ref 20–29)
Calcium: 9.2 mg/dL (ref 8.7–10.2)
Chloride: 105 mmol/L (ref 96–106)
Creatinine, Ser: 0.87 mg/dL (ref 0.57–1.00)
Globulin, Total: 2.5 g/dL (ref 1.5–4.5)
Glucose: 86 mg/dL (ref 70–99)
Potassium: 3.9 mmol/L (ref 3.5–5.2)
Sodium: 143 mmol/L (ref 134–144)
Total Protein: 6.6 g/dL (ref 6.0–8.5)
eGFR: 79 mL/min/1.73 (ref >59)

## 2024-02-18 ENCOUNTER — Ambulatory Visit: Payer: Self-pay | Admitting: Pediatrics

## 2024-02-23 ENCOUNTER — Encounter: Payer: Self-pay | Admitting: Pediatrics

## 2024-03-02 ENCOUNTER — Ambulatory Visit: Admitting: Pediatrics

## 2024-03-10 ENCOUNTER — Ambulatory Visit: Payer: Self-pay

## 2024-03-10 ENCOUNTER — Ambulatory Visit: Admitting: Internal Medicine

## 2024-03-10 NOTE — Telephone Encounter (Signed)
 FYI Only or Action Required?: FYI only for provider: appointment scheduled on today.  Patient was last seen in primary care on 02/16/2024 by Herold Hadassah SQUIBB, MD.  Called Nurse Triage reporting Abdominal Pain.  Symptoms began several days ago.  Interventions attempted: Nothing.  Symptoms are: gradually worsening.  Triage Disposition: See Physician Within 24 Hours  Patient/caregiver understands and will follow disposition?: Yes, will follow disposition  Copied from CRM #[id number]. Topic: Clinical - Red Word Triage >> Mar 10, 2024  8:18 AM Tiffini S wrote: [facility] that prompted transfer to Nurse Triage: severe diarrhea, vomiting and pain- was sent home from work today Reason for Disposition  [1] MODERATE pain (e.g., interferes with normal activities) AND [2] pain comes and goes (cramps) AND [3] present > 24 hours  (Exception: Pain with Vomiting or Diarrhea - see that Guideline.)  Answer Assessment - Initial Assessment Questions 1. LOCATION: Where does it hurt?      Entire stomach 2. RADIATION: Does the pain shoot anywhere else? (e.g., chest, back)     denies 3. ONSET: When did the pain begin? (e.g., minutes, hours or days ago)      About 4 days ago 4. SUDDEN: Gradual or sudden onset?     gradual 5. PATTERN Does the pain come and go, or is it constant?     intermittent 6. SEVERITY: How bad is the pain?  (e.g., Scale 1-10; mild, moderate, or severe)     After going to bathroom it is mild 7. RECURRENT SYMPTOM: Have you ever had this type of stomach pain before? If Yes, ask: When was the last time? and What happened that time?      denies 8. CAUSE: What do you think is causing the stomach pain? (e.g., gallstones, recent abdominal surgery)     unsure 9. RELIEVING/AGGRAVATING FACTORS: What makes it better or worse? (e.g., antacids, bending or twisting motion, bowel movement)     Eating makes worse 10. OTHER SYMPTOMS: Do you have any other symptoms? (e.g., back  pain, diarrhea, fever, urination pain, vomiting)       Diarrhea, denies fever  Protocols used: Abdominal Pain - Female-A-AH

## 2024-03-14 ENCOUNTER — Telehealth: Payer: Self-pay

## 2024-03-14 ENCOUNTER — Ambulatory Visit: Admitting: Family Medicine

## 2024-03-14 ENCOUNTER — Other Ambulatory Visit: Payer: Self-pay

## 2024-03-14 ENCOUNTER — Encounter: Payer: Self-pay | Admitting: Family Medicine

## 2024-03-14 VITALS — BP 90/59 | HR 90 | Temp 98.0°F | Ht 64.0 in | Wt 164.4 lb

## 2024-03-14 DIAGNOSIS — R21 Rash and other nonspecific skin eruption: Secondary | ICD-10-CM | POA: Diagnosis not present

## 2024-03-14 DIAGNOSIS — R1011 Right upper quadrant pain: Secondary | ICD-10-CM | POA: Diagnosis not present

## 2024-03-14 DIAGNOSIS — Z789 Other specified health status: Secondary | ICD-10-CM | POA: Diagnosis not present

## 2024-03-14 DIAGNOSIS — K219 Gastro-esophageal reflux disease without esophagitis: Secondary | ICD-10-CM | POA: Diagnosis not present

## 2024-03-14 MED ORDER — TRIAMCINOLONE ACETONIDE 40 MG/ML IJ SUSP
40.0000 mg | Freq: Once | INTRAMUSCULAR | Status: AC
  Administered 2024-03-14: 40 mg via INTRAMUSCULAR

## 2024-03-14 MED ORDER — OMEPRAZOLE 40 MG PO CPDR
40.0000 mg | DELAYED_RELEASE_CAPSULE | Freq: Every day | ORAL | 1 refills | Status: AC | PRN
  Filled 2024-03-14: qty 90, 90d supply, fill #0

## 2024-03-14 NOTE — Addendum Note (Signed)
 Addended by: VICCI DUWAINE SQUIBB on: 03/14/2024 11:54 AM   Modules accepted: Orders

## 2024-03-14 NOTE — Telephone Encounter (Signed)
 Patient is aware she is scheduled at [facility] for tomorrow morning at 8:00 am. She must remain NPO after midnight tonight for a successful procedure.

## 2024-03-14 NOTE — Assessment & Plan Note (Signed)
 Will treat with triamcinalone shot. Call with any concerns.

## 2024-03-14 NOTE — Progress Notes (Signed)
 BP (!) 90/59   Pulse 90   Temp 98 F (36.7 C) (Oral)   Ht 5' 4 (1.626 m)   Wt 164 lb 6.4 oz (74.6 kg)   SpO2 98%   BMI 28.22 kg/m    Subjective:    Patient ID: [name], female    DOB: 18-Aug-1970, 53 y.o.   MRN: [id number]  HPI: Karen Walker is a 53 y.o. female  Chief Complaint  Patient presents with   Abdominal Pain    Patient states she has been having a dull, aching abdominal pain since last Sunday. States that every time she eats, she has to go straight to the bathroom. States she seems to feel more pain when eating and does wake her up occasionally during the night.    ABDOMINAL PAIN  Duration:about a week Onset: sudden Severity: moderate Quality: cramping  Location:  epigastric, RUQ and diffuse  Episode duration: until she goes to the bathroom Radiation: no Frequency: several times a day- with eating Alleviating factors: going to the bathroom Aggravating factors: eating Status: worse Treatments attempted: none Fever: no Nausea: no Vomiting: no Weight loss: no Decreased appetite: no Diarrhea: yes Constipation: no Blood in stool: no Heartburn: yes Jaundice: no Rash: yes Dysuria/urinary frequency: no Hematuria: no History of sexually transmitted disease: no Recurrent NSAID use: no  Relevant past medical, surgical, family and social history reviewed and updated as indicated. Interim medical history since our last visit reviewed. Allergies and medications reviewed and updated.  Review of Systems  Constitutional: Negative.   Respiratory: Negative.    Cardiovascular: Negative.   Gastrointestinal:  Positive for abdominal pain and diarrhea. Negative for abdominal distention, anal bleeding, blood in stool, constipation, nausea, rectal pain and vomiting.  Musculoskeletal: Negative.   Skin:  Positive for rash (perineum). Negative for color change, pallor and wound.  Psychiatric/Behavioral: Negative.      Per HPI unless specifically indicated  above     Objective:    BP (!) 90/59   Pulse 90   Temp 98 F (36.7 C) (Oral)   Ht 5' 4 (1.626 m)   Wt 164 lb 6.4 oz (74.6 kg)   SpO2 98%   BMI 28.22 kg/m   Wt Readings from Last 3 Encounters:  03/14/24 164 lb 6.4 oz (74.6 kg)  02/16/24 166 lb (75.3 kg)  01/14/24 165 lb 6 oz (75 kg)    Physical Exam Vitals and nursing note reviewed.  Constitutional:      General: She is not in acute distress.    Appearance: Normal appearance. She is not ill-appearing, toxic-appearing or diaphoretic.  HENT:     Head: Normocephalic and atraumatic.     Right Ear: External ear normal.     Left Ear: External ear normal.     Nose: Nose normal.     Mouth/Throat:     Mouth: Mucous membranes are moist.     Pharynx: Oropharynx is clear.  Eyes:     General: No scleral icterus.       Right eye: No discharge.        Left eye: No discharge.     Extraocular Movements: Extraocular movements intact.     Conjunctiva/sclera: Conjunctivae normal.     Pupils: Pupils are equal, round, and reactive to light.  Cardiovascular:     Rate and Rhythm: Normal rate and regular rhythm.     Pulses: Normal pulses.     Heart sounds: Normal heart sounds. No murmur heard.  No friction rub. No gallop.  Pulmonary:     Effort: Pulmonary effort is normal. No respiratory distress.     Breath sounds: Normal breath sounds. No stridor. No wheezing, rhonchi or rales.  Chest:     Chest wall: No tenderness.  Abdominal:     General: Abdomen is flat. Bowel sounds are increased.     Palpations: Abdomen is soft.     Tenderness: There is abdominal tenderness in the right upper quadrant.  Musculoskeletal:        General: Normal range of motion.     Cervical back: Normal range of motion and neck supple.  Skin:    General: Skin is warm and dry.     Capillary Refill: Capillary refill takes less than 2 seconds.     Coloration: Skin is not jaundiced or pale.     Findings: No bruising, erythema, lesion or rash.  Neurological:      General: No focal deficit present.     Mental Status: She is alert and oriented to person, place, and time. Mental status is at baseline.  Psychiatric:        Mood and Affect: Mood normal.        Behavior: Behavior normal.        Thought Content: Thought content normal.        Judgment: Judgment normal.     Results for orders placed or performed in visit on 02/16/24  Comp Met (CMET)   Collection Time: 02/16/24  4:00 PM  Result Value Ref Range   Glucose 86 70 - 99 mg/dL   BUN 19 6 - 24 mg/dL   Creatinine, Ser 9.12 0.57 - 1.00 mg/dL   eGFR 79 >40 fO/fpw/8.26   BUN/Creatinine Ratio 22 9 - 23   Sodium 143 134 - 144 mmol/L   Potassium 3.9 3.5 - 5.2 mmol/L   Chloride 105 96 - 106 mmol/L   CO2 24 20 - 29 mmol/L   Calcium 9.2 8.7 - 10.2 mg/dL   Total Protein 6.6 6.0 - 8.5 g/dL   Albumin 4.1 3.8 - 4.9 g/dL   Globulin, Total 2.5 1.5 - 4.5 g/dL   Bilirubin Total 0.3 0.0 - 1.2 mg/dL   Alkaline Phosphatase 62 49 - 135 IU/L   AST 24 0 - 40 IU/L   ALT 21 0 - 32 IU/L      Assessment & Plan:   Problem List Items Addressed This Visit       Musculoskeletal and Integument   Rash   Will treat with triamcinalone shot. Call with any concerns.       Relevant Medications   triamcinolone  acetonide (KENALOG -40) injection 40 mg   Other Visit Diagnoses       RUQ pain    -  Primary   Will get her set up with US  and check labs. Await results.   Relevant Orders   Comp Met (CMET)   Amylase   Lipase   CBC with Differential/Platelet   US  Abdomen Limited RUQ (LIVER/GB)     Gastroesophageal reflux disease without esophagitis       Has not been taking her omeprazole . Will restart it. Call with any concerns. Continue to monitor.   Relevant Medications   omeprazole  (PRILOSEC) 40 MG capsule        Follow up plan: Return in about 4 weeks (around 04/11/2024).

## 2024-03-15 ENCOUNTER — Ambulatory Visit: Admission: RE | Admit: 2024-03-15 | Discharge: 2024-03-15 | Attending: Family Medicine

## 2024-03-15 DIAGNOSIS — K7689 Other specified diseases of liver: Secondary | ICD-10-CM | POA: Diagnosis not present

## 2024-03-15 DIAGNOSIS — R1011 Right upper quadrant pain: Secondary | ICD-10-CM

## 2024-03-15 DIAGNOSIS — K824 Cholesterolosis of gallbladder: Secondary | ICD-10-CM | POA: Diagnosis not present

## 2024-03-15 DIAGNOSIS — K828 Other specified diseases of gallbladder: Secondary | ICD-10-CM | POA: Diagnosis not present

## 2024-03-15 LAB — CBC WITH DIFFERENTIAL/PLATELET
Basophils Absolute: 0 x10E3/uL (ref 0.0–0.2)
Basos: 1 %
EOS (ABSOLUTE): 0.2 x10E3/uL (ref 0.0–0.4)
Eos: 3 %
Hematocrit: 40.4 % (ref 34.0–46.6)
Hemoglobin: 12.9 g/dL (ref 11.1–15.9)
Immature Grans (Abs): 0 x10E3/uL (ref 0.0–0.1)
Immature Granulocytes: 0 %
Lymphocytes Absolute: 1.7 x10E3/uL (ref 0.7–3.1)
Lymphs: 38 %
MCH: 29.7 pg (ref 26.6–33.0)
MCHC: 31.9 g/dL (ref 31.5–35.7)
MCV: 93 fL (ref 79–97)
Monocytes Absolute: 0.3 x10E3/uL (ref 0.1–0.9)
Monocytes: 7 %
Neutrophils Absolute: 2.3 x10E3/uL (ref 1.4–7.0)
Neutrophils: 51 %
Platelets: 209 x10E3/uL (ref 150–450)
RBC: 4.34 x10E6/uL (ref 3.77–5.28)
RDW: 12.6 % (ref 11.7–15.4)
WBC: 4.5 x10E3/uL (ref 3.4–10.8)

## 2024-03-15 LAB — COMPREHENSIVE METABOLIC PANEL WITH GFR
ALT: 27 IU/L (ref 0–32)
AST: 20 IU/L (ref 0–40)
Albumin: 4.2 g/dL (ref 3.8–4.9)
Alkaline Phosphatase: 62 IU/L (ref 49–135)
BUN/Creatinine Ratio: 15 (ref 9–23)
BUN: 12 mg/dL (ref 6–24)
Bilirubin Total: 0.4 mg/dL (ref 0.0–1.2)
CO2: 25 mmol/L (ref 20–29)
Calcium: 9.2 mg/dL (ref 8.7–10.2)
Chloride: 106 mmol/L (ref 96–106)
Creatinine, Ser: 0.78 mg/dL (ref 0.57–1.00)
Globulin, Total: 2.5 g/dL (ref 1.5–4.5)
Glucose: 79 mg/dL (ref 70–99)
Potassium: 4 mmol/L (ref 3.5–5.2)
Sodium: 143 mmol/L (ref 134–144)
Total Protein: 6.7 g/dL (ref 6.0–8.5)
eGFR: 90 mL/min/1.73 (ref >59)

## 2024-03-15 LAB — AMYLASE: Amylase: 116 U/L — ABNORMAL HIGH (ref 31–110)

## 2024-03-15 LAB — LIPASE: Lipase: 40 U/L (ref 14–72)

## 2024-03-17 ENCOUNTER — Ambulatory Visit: Payer: Self-pay | Admitting: Family Medicine

## 2024-03-17 DIAGNOSIS — K824 Cholesterolosis of gallbladder: Secondary | ICD-10-CM

## 2024-03-17 DIAGNOSIS — R1011 Right upper quadrant pain: Secondary | ICD-10-CM

## 2024-03-23 ENCOUNTER — Ambulatory Visit: Admitting: Surgery

## 2024-03-23 ENCOUNTER — Encounter: Payer: Self-pay | Admitting: Surgery

## 2024-03-23 VITALS — BP 103/70 | HR 84 | Temp 98.0°F | Ht 64.0 in | Wt 160.0 lb

## 2024-03-23 DIAGNOSIS — K824 Cholesterolosis of gallbladder: Secondary | ICD-10-CM | POA: Diagnosis not present

## 2024-03-23 DIAGNOSIS — R1011 Right upper quadrant pain: Secondary | ICD-10-CM | POA: Diagnosis not present

## 2024-03-23 DIAGNOSIS — K219 Gastro-esophageal reflux disease without esophagitis: Secondary | ICD-10-CM | POA: Diagnosis not present

## 2024-03-23 NOTE — Patient Instructions (Addendum)
 Your HIDA scan is scheduled for 03/30/2024 8 am at [facility]. Plan to be there for 2.5 hours. Nothing to eat or drink 6 hours prior to appt.   Gallbladder Problems: Eating Plan  Having high blood cholesterol, obesity, an inactive lifestyle, an unhealthy diet, or diabetes can put you at risk for getting gallstones. If you have a gallbladder problem, you may have trouble digesting fats. You may also have symptoms when you eat a lot of fat. Eating a low-fat diet can help lessen your symptoms. It may be helpful before and after having surgery to have your gallbladder taken out. Your health care provider may recommend that you work with an expert in healthy eating called a dietitian. They can help you lower the amount of fat in your diet. What are tips for following this plan? General guidelines Limit how much fat you have to less than 30% of your total daily calories. If you eat around 1,800 calories each day, you'll be eating less than 60 grams (g) of fat a day. Fat is an important part of a healthy diet. Eating a low-fat diet can make it hard to keep a healthy body weight. Ask your dietitian how much fat, calories, and other nutrients you need each day. Eat small meals often throughout the day instead of 3 large meals. Drink at least 8-10 cups (1.9-2.4 L) of fluid a day. If you drink alcohol: Limit how much you have to: 0-1 drink a day if you're female. 0-2 drinks a day if you're female. Know how much alcohol is in your drink. In the [place] one drink is one 12 oz bottle of beer (355 mL), one 5 oz glass of wine (148 mL), or one 1 oz glass of hard liquor (44 mL). Reading food labels  Check nutrition facts on food labels for how much fat is in a serving. Choose foods with less than 3 grams of fat per serving. Shopping Choose nonfat and low-fat healthy foods. Look for the words nonfat, low-fat, or fat-free. Avoid buying processed or prepackaged foods. Cooking Cook using low-fat  methods, such as baking, broiling, grilling, or boiling. Cook with small amounts of healthy fats, such as olive oil, grapeseed oil, canola oil, avocado oil, or sunflower oil. What foods are recommended?  All fresh, frozen, or canned fruits and vegetables. Whole grains. Low-fat or nonfat (skim) milk and yogurt. Lean meat, skinless poultry, fish, eggs, and beans. Low-fat protein supplement powders or drinks. Spices and herbs. The items listed above may not be all the foods and drinks you can have. Talk with a dietitian to learn more. What foods are not recommended? High-fat foods. These include baked goods, fast food, fatty cuts of meat, ice cream, french toast, sweet rolls, pizza, cheese bread, foods covered with butter, creamy sauces, or cheese. Fried foods. These include french fries, tempura, battered fish, breaded chicken, fried breads, and sweets. Foods that cause bloating and gas. The items listed above may not be all the foods and drinks you should avoid. Talk with a dietitian to learn more. This information is not intended to replace advice given to you by your health care provider. Make sure you discuss any questions you have with your health care provider. Document Revised: 06/30/2023 Document Reviewed: 06/30/2023 Elsevier Patient Education  2025 [employer].

## 2024-03-23 NOTE — Progress Notes (Signed)
 03/23/2024  Reason for Visit:  Gallbladder polyps and RUQ pain  Requesting Provider:  Duwaine Louder, DO  History of Present Illness: Karen Walker. is a 53 y.o. female presenting for evaluation of RUQ abdominal pain.  The patient reports having pain in the epigastric and RUQ areas for about 2-3 weeks now.  It is associated with meals.  The pain also radiates to the back.  Denies any nausea or vomiting.  She does report feeling more distended or bloated after meals and also having diarrhea.  She had an ultrasound of the RUQ which showed no cholelithiasis but did show gallbladder polyps between 6-8 mm in size.  Her LFTs on 03/14/24 were normal but she did have a minimally elevated amylase.  Past Medical History: Past Medical History:  Diagnosis Date   Allergic rhinitis    Anxiety    Breast lump on right side at 8 o'clock position 12/06/2014   Depression    Dysplastic nevus 02/14/2020   Mod to severe - close to margin R foot inf to her med maleolus   Dysplastic nevus 08/07/2022   L middle medical calf - moderate to severe, recheck at next visit   History of skin cancer    Hx of migraine headaches    light sensivity, unilateral HA   Plantar fasciitis 09/06/2019   Urticaria      Past Surgical History: Past Surgical History:  Procedure Laterality Date   BREAST CYST ASPIRATION Right 2013   negative. Done at Dr. Fredirick office   COLONOSCOPY WITH PROPOFOL  N/A 07/20/2020   Procedure: COLONOSCOPY WITH PROPOFOL ;  Surgeon: Janalyn Keene NOVAK, MD;  Location: [facility] ENDOSCOPY;  Service: Endoscopy;  Laterality: N/A;  SPANISH INTERPRETER   ESOPHAGOGASTRODUODENOSCOPY (EGD) WITH PROPOFOL  N/A 07/20/2020   Procedure: ESOPHAGOGASTRODUODENOSCOPY (EGD) WITH PROPOFOL ;  Surgeon: Janalyn Keene NOVAK, MD;  Location: [facility] ENDOSCOPY;  Service: Endoscopy;  Laterality: N/A;   HERNIA REPAIR  2014   hysterectemy     melonoma removal on R medial arch of foot  2008   TOTAL ABDOMINAL HYSTERECTOMY W/ BILATERAL  SALPINGOOPHORECTOMY  2014   TOTAL ABDOMINAL HYSTERECTOMY W/ BILATERAL SALPINGOOPHORECTOMY     UMBILICAL HERNIA REPAIR     occurred with hysterectemy    Home Medications: Prior to Admission medications  Medication Sig Start Date End Date Taking? Authorizing Provider  cetirizine  (ZYRTEC ) 10 MG tablet Take 1 tablet (10 mg total) by mouth daily. 01/14/24  Yes Johnson, Megan P, DO  cyclobenzaprine  (FLEXERIL ) 10 MG tablet Take 1 tablet (10 mg total) by mouth at bedtime as needed for muscle spasms. 01/14/24  Yes Johnson, Megan P, DO  diclofenac  Sodium (VOLTAREN ) 1 % GEL Apply 4 g topically 4 (four) times daily. 01/14/24  Yes Johnson, Megan P, DO  diphenhydrAMINE  (BENADRYL  ALLERGY) 25 mg capsule Take 1 capsule (25 mg total) by mouth every 6 (six) hours as needed. 12/18/22  Yes Herold Hadassah SQUIBB, MD  hydrocortisone  (ANUSOL -HC) 2.5 % rectal cream Place 1 Application rectally 2 (two) times daily. 12/31/23  Yes Herold Hadassah SQUIBB, MD  ketoconazole  (NIZORAL ) 2 % shampoo Apply 2 to 3 times per week, massage into scalp and leave in for 5 minutes before rinsing out 08/12/23  Yes Hester Alm BROCKS, MD  Magnesium  Glycinate 100 MG CAPS Take 4 capsules by mouth nightly for headaches, sleep and anxiety. 04/09/23  Yes   montelukast  (SINGULAIR ) 10 MG tablet Take 1 tablet (10 mg total) by mouth at bedtime. PRN 01/14/24  Yes Johnson, Megan P, DO  nortriptyline  (PAMELOR )  25 MG capsule Take 1 capsule (25 mg total) by mouth at bedtime. PRN 01/14/24  Yes Johnson, Megan P, DO  omeprazole  (PRILOSEC) 40 MG capsule Take 1 capsule (40 mg total) by mouth daily as needed. 03/14/24  Yes Johnson, Megan P, DO  ondansetron  (ZOFRAN ) 4 MG tablet Take 1 tablet (4 mg total) by mouth every 8 (eight) hours as needed for nausea or vomiting. 06/03/23  Yes Herold Hadassah SQUIBB, MD  Riboflavin  (B-2) 100 MG TABS Take 400 mg (4 tablets) by mouth daily for 90 days for headaches. 05/14/23  Yes   Rimegepant Sulfate  (NURTEC) 75 MG TBDP Dissolve 1 tablet (75 mg total) by  mouth daily as needed. 12/31/23  Yes Herold Hadassah SQUIBB, MD  SUMAtriptan  (IMITREX ) 25 MG tablet Take 1 tablet (25 mg total) by mouth once daily as needed for migraine. May repeat in 2 hours if headache persists or recurs. (Max 2 tablets per 24 hours) 01/14/24  Yes Johnson, Megan P, DO  triamcinolone  ointment (KENALOG ) 0.5 % Apply 1 Application topically 2 (two) times daily. 12/18/22  Yes Herold Hadassah SQUIBB, MD  valACYclovir  (VALTREX ) 500 MG tablet Take 1 tablet (500 mg total) by mouth 2 (two) times daily as needed 12/22/23  Yes Johnson, Megan P, DO  albuterol  (VENTOLIN  HFA) 108 (90 Base) MCG/ACT inhaler Inhale 2 puffs into the lungs every 6 (six) hours as needed for wheezing or shortness of breath. Patient not taking: Reported on 07/03/2020 05/11/20 07/03/20  Vicci Duwaine SQUIBB, DO    Allergies: Allergies[1]  Social History:  reports that she has never smoked. She has never used smokeless tobacco. She reports that she does not drink alcohol and does not use drugs.   Family History: Family History  Problem Relation Age of Onset   Stroke Father    Asthma Daughter    Urticaria Son    Food Allergy Son        red dye, dairy   Allergic rhinitis Neg Hx    Angioedema Neg Hx    Eczema Neg Hx     Review of Systems: Review of Systems  Constitutional:  Negative for chills and fever.  Respiratory:  Negative for shortness of breath.   Cardiovascular:  Negative for chest pain.  Gastrointestinal:  Positive for abdominal pain and diarrhea. Negative for nausea and vomiting.  Musculoskeletal:  Positive for back pain.    Physical Exam BP 103/70   Pulse 84   Temp 98 F (36.7 C) (Oral)   Ht 5' 4 (1.626 m)   Wt 160 lb (72.6 kg)   SpO2 96%   BMI 27.46 kg/m  CONSTITUTIONAL: No acute distress, well nourished. HEENT:  Normocephalic, atraumatic, extraocular motion intact. RESPIRATORY:  Lungs are clear, and breath sounds are equal bilaterally. Normal respiratory effort without pathologic use of accessory  muscles. CARDIOVASCULAR: Heart is regular without murmurs, gallops, or rubs. GI: The abdomen is soft, non-distended, with some soreness to palpation in the RUQ more so than epigatric area, but has soreness in both.  Negative Murphy's sign.   MUSCULOSKELETAL:  Normal muscle strength and tone in all four extremities.  No peripheral edema or cyanosis. NEUROLOGIC:  Motor and sensation is grossly normal.  Cranial nerves are grossly intact. PSYCH:  Alert and oriented to person, place and time. Affect is normal.  Laboratory Analysis: Labs from 03/14/24: Na 143, K 4, Cl 106, CO2 25, BUN 12, Cr 0.78.  Total bili 0.4, AST 20, ALT 27, Alk Phos 62, amylase 116.  WBC 4.5, Hgb  12.9, Hct 40.4, Plt 209  Imaging: Ultrasound RUQ on 03/15/24: IMPRESSION: 1. Gallbladder wall polyps measuring up to 8 mm, with additional 6 mm polyp. Consider ultrasound follow-up in 6 months to assess stability.  Assessment and Plan: This is a 53 y.o. female with gallbladder polyps and RUQ pain.  --Discussed with the patient the findings on her ultrasound.  Although she does have polyps, it would be unusual for them to be causing her pain symptoms.  She does not have cholelithiasis to cause her symptoms.  Discussed with her that potentially she could have biliary dyskinesia, which would explain the symptoms she's having.  However, she also has GERD and she reports that she does not take a medication for it consistently.  She got a prescription from her PCP for Prilosec, so I also encouraged her to start taking that on a daily basis to see if this helps.  --Discussed with the patient that although her symptoms may be biliary in origin, I currently do not have conclusive evidence to offer her a cholecystectomy.  Doing a cholecystectomy for the polyps alone is not unreasonable, but we could also monitor these with repeat ultrasound, and this may not resolve her symptoms.  As such, would recommend obtaining a HIDA scan to further evaluate  her gallbladder.  If positive, then would discuss cholecystectomy.  If negative, then would likely do a referral to GI for further evaluation. --Patient is in agreement with this plan and all of her questions have been answered.  I spent 30 minutes dedicated to the care of this patient on the date of this encounter to include pre-visit review of records, face-to-face time with the patient discussing diagnosis and management, and any post-visit coordination of care.   Aloysius Sheree Plant, MD [facility]       [1]  Allergies Allergen Reactions   Codeine Other (See Comments)   Pollen Extract

## 2024-03-30 ENCOUNTER — Ambulatory Visit
Admission: RE | Admit: 2024-03-30 | Discharge: 2024-03-30 | Disposition: A | Discharge status: Home / Self Care | Source: Ambulatory Visit | Attending: Surgery | Admitting: Surgery

## 2024-03-30 DIAGNOSIS — R1011 Right upper quadrant pain: Secondary | ICD-10-CM | POA: Diagnosis not present

## 2024-03-30 MED ORDER — TECHNETIUM TC 99M MEBROFENIN IV KIT
5.3000 | PACK | Freq: Once | INTRAVENOUS | Status: AC | PRN
  Administered 2024-03-30: 5.3 via INTRAVENOUS

## 2024-04-01 ENCOUNTER — Telehealth: Payer: Self-pay

## 2024-04-01 NOTE — Telephone Encounter (Signed)
 Copied from CRM #[id number]. Topic: Clinical - Lab/Test Results >> Apr 01, 2024 11:52 AM Delon DASEN wrote: Reason for CRM: calling for results of nuclear and ultrasound- please call [phone]

## 2024-04-04 NOTE — Telephone Encounter (Signed)
Called and notified patient of Dr. Johnson's message.  

## 2024-04-04 NOTE — Telephone Encounter (Signed)
 She was notified of those results 12/11. If she's referring to the ones ordered by general surgery- they will give her the results when they are back as we didn't order those tests.

## 2024-04-08 ENCOUNTER — Ambulatory Visit: Admitting: Surgery

## 2024-04-08 ENCOUNTER — Encounter: Payer: Self-pay | Admitting: Surgery

## 2024-04-08 VITALS — BP 102/70 | HR 98 | Ht 64.0 in | Wt 161.0 lb

## 2024-04-08 DIAGNOSIS — K828 Other specified diseases of gallbladder: Secondary | ICD-10-CM | POA: Diagnosis not present

## 2024-04-08 NOTE — Patient Instructions (Signed)
 You have requested to have your gallbladder removed. This will be done at [facility] with Dr. Desiderio.   If you are on any injectable weight loss medication, you will need to stop taking your GLP-1 injectable (weight loss) medications 8 days before your surgery to avoid any complications with anesthesia.    You will most likely be out of work 1-2 weeks for this surgery.  If you have FMLA or disability paperwork that needs filled out you may drop this off at our office or this can be faxed to [phone].   You will return after your post-op appointment with a lifting restriction for approximately 4 more weeks.   You will be able to eat anything you would like to following surgery. But, start by eating a bland diet and advance this as tolerated. The Gallbladder diet is below, please go as closely by this diet as possible prior to surgery to avoid any further attacks.   Please see the (blue)pre-care form that you have been given today. You will receive several phone calls from [facility] prior to your surgery date.   If you have any questions, please call our office.   Laparoscopic Cholecystectomy Laparoscopic cholecystectomy is surgery to remove the gallbladder. The gallbladder is located in the upper right part of the abdomen, behind the liver. It is a storage sac for bile, which is produced in the liver. Bile aids in the digestion and absorption of fats. Cholecystectomy is often done for inflammation of the gallbladder (cholecystitis). This condition is usually caused by a buildup of gallstones (cholelithiasis) in the gallbladder. Gallstones can block the flow of bile, and that can result in inflammation and pain. In severe cases, emergency surgery may be required. If emergency surgery is not required, you will have time to prepare for the procedure. Laparoscopic surgery is an alternative to open surgery. Laparoscopic surgery has a shorter recovery time. Your common bile duct may also  need to be examined during the procedure. If stones are found in the common bile duct, they may be removed. LET [facility] CARE PROVIDER KNOW ABOUT: Any allergies you have. All medicines you are taking, including vitamins, herbs, eye drops, creams, and over-the-counter medicines. Previous problems you or members of your family have had with the use of anesthetics. Any blood disorders you have. Previous surgeries you have had.  Any medical conditions you have. RISKS AND COMPLICATIONS Generally, this is a safe procedure. However, problems may occur, including: Infection. Bleeding. Allergic reactions to medicines. Damage to other structures or organs. A stone remaining in the common bile duct. A bile leak from the cyst duct that is clipped when your gallbladder is removed. The need to convert to open surgery, which requires a larger incision in the abdomen. This may be necessary if your surgeon thinks that it is not safe to continue with a laparoscopic procedure. BEFORE THE PROCEDURE Ask your health care provider about: Changing or stopping your regular medicines. This is especially important if you are taking diabetes medicines or blood thinners. Taking medicines such as aspirin and ibuprofen . These medicines can thin your blood. Do not take these medicines before your procedure if your health care provider instructs you not to. Follow instructions from your health care provider about eating or drinking restrictions. Let your health care provider know if you develop a cold or an infection before surgery. Plan to have someone take you home after the procedure. Ask your health care provider how your surgical site will be  marked or identified. You may be given antibiotic medicine to help prevent infection. PROCEDURE To reduce your risk of infection: Your health care team will wash or sanitize their hands. Your skin will be washed with soap. An IV tube may be inserted into one of your  veins. You will be given a medicine to make you fall asleep (general anesthetic). A breathing tube will be placed in your mouth. The surgeon will make several small cuts (incisions) in your abdomen. A thin, lighted tube (laparoscope) that has a tiny camera on the end will be inserted through one of the small incisions. The camera on the laparoscope will send a picture to a TV screen (monitor) in the operating room. This will give the surgeon a good view inside your abdomen. A gas will be pumped into your abdomen. This will expand your abdomen to give the surgeon more room to perform the surgery. Other tools that are needed for the procedure will be inserted through the other incisions. The gallbladder will be removed through one of the incisions. After your gallbladder has been removed, the incisions will be closed with stitches (sutures), staples, or skin glue. Your incisions may be covered with a bandage (dressing). The procedure may vary among health care providers and hospitals. AFTER THE PROCEDURE Your blood pressure, heart rate, breathing rate, and blood oxygen level will be monitored often until the medicines you were given have worn off. You will be given medicines as needed to control your pain.   This information is not intended to replace advice given to you by your health care provider. Make sure you discuss any questions you have with your health care provider.   Document Released: 03/24/2005 Document Revised: 12/13/2014 Document Reviewed: 11/03/2012 Elsevier Interactive Patient Education 2016 [employer]     Low-Fat Diet for Gallbladder Conditions A low-fat diet can be helpful if you have pancreatitis or a gallbladder condition. With these conditions, your pancreas and gallbladder have trouble digesting fats. A healthy eating plan with less fat will help rest your pancreas and gallbladder and reduce your symptoms. WHAT DO I NEED TO KNOW ABOUT THIS DIET? Eat a low-fat  diet. Reduce your fat intake to less than 20-30% of your total daily calories. This is less than 50-60 g of fat per day. Remember that you need some fat in your diet. Ask your dietician what your daily goal should be. Choose nonfat and low-fat healthy foods. Look for the words nonfat, low fat, or fat free. As a guide, look on the label and choose foods with less than 3 g of fat per serving. Eat only one serving. Avoid alcohol. Do not smoke. If you need help quitting, talk with your health care provider. Eat small frequent meals instead of three large heavy meals. WHAT FOODS CAN I EAT? Grains Include healthy grains and starches such as potatoes, wheat bread, fiber-rich cereal, and brown rice. Choose whole grain options whenever possible. In adults, whole grains should account for 45-65% of your daily calories.  Fruits and Vegetables Eat plenty of fruits and vegetables. Fresh fruits and vegetables add fiber to your diet. Meats and Other Protein Sources Eat lean meat such as chicken and pork. Trim any fat off of meat before cooking it. Eggs, fish, and beans are other sources of protein. In adults, these foods should account for 10-35% of your daily calories. Dairy Choose low-fat milk and dairy options. Dairy includes fat and protein, as well as calcium.  Fats and Oils Limit high-fat  foods such as fried foods, sweets, baked goods, sugary drinks.  Other Creamy sauces and condiments, such as mayonnaise, can add extra fat. Think about whether or not you need to use them, or use smaller amounts or low fat options. WHAT FOODS ARE NOT RECOMMENDED? High fat foods, such as: [employer]. Ice cream. French toast. Sweet rolls. Pizza. Cheese bread. Foods covered with batter, butter, creamy sauces, or cheese. Fried foods. Sugary drinks and desserts. Foods that cause gas or bloating   This information is not intended to replace advice given to you by your health care provider. Make sure you  discuss any questions you have with your health care provider.   Document Released: 03/29/2013 Document Reviewed: 03/29/2013 Elsevier Interactive Patient Education [employer]

## 2024-04-08 NOTE — Progress Notes (Signed)
 " 04/08/2024  History of Present Illness: Karen Walker. is a 54 y.o. female presenting for follow-up of right upper quadrant abdominal pain with diarrhea and bloatedness.  She was last seen on 03/23/2024.  She had had a prior ultrasound of the right upper quadrant on 03/15/2024 which showed gallbladder polyps.  However discussed with the patient that these would be very unusual to be giving her the symptoms.  As such, we will order HIDA scan as a precaution to further evaluate the gallbladder.  This showed that her ejection fraction was 98% consistent with biliary hyperkinesia.  The patient presents today for follow-up to discuss the results.  She reports that her symptoms are been stable continues having episodes of right upper quadrant pain with bloatedness and nausea.  Denies any worsening issues.  Past Medical History: Past Medical History:  Diagnosis Date   Allergic rhinitis    Anxiety    Breast lump on right side at 8 o'clock position 12/06/2014   Depression    Dysplastic nevus 02/14/2020   Mod to severe - close to margin R foot inf to her med maleolus   Dysplastic nevus 08/07/2022   L middle medical calf - moderate to severe, recheck at next visit   History of skin cancer    Hx of migraine headaches    light sensivity, unilateral HA   Plantar fasciitis 09/06/2019   Urticaria      Past Surgical History: Past Surgical History:  Procedure Laterality Date   BREAST CYST ASPIRATION Right 2013   negative. Done at Dr. Fredirick office   COLONOSCOPY WITH PROPOFOL  N/A 07/20/2020   Procedure: COLONOSCOPY WITH PROPOFOL ;  Surgeon: Janalyn Keene NOVAK, MD;  Location: [facility] ENDOSCOPY;  Service: Endoscopy;  Laterality: N/A;  SPANISH INTERPRETER   ESOPHAGOGASTRODUODENOSCOPY (EGD) WITH PROPOFOL  N/A 07/20/2020   Procedure: ESOPHAGOGASTRODUODENOSCOPY (EGD) WITH PROPOFOL ;  Surgeon: Janalyn Keene NOVAK, MD;  Location: [facility] ENDOSCOPY;  Service: Endoscopy;  Laterality: N/A;   HERNIA REPAIR  2014    hysterectemy     melonoma removal on R medial arch of foot  2008   TOTAL ABDOMINAL HYSTERECTOMY W/ BILATERAL SALPINGOOPHORECTOMY  2014   TOTAL ABDOMINAL HYSTERECTOMY W/ BILATERAL SALPINGOOPHORECTOMY     UMBILICAL HERNIA REPAIR     occurred with hysterectemy    Home Medications: Prior to Admission medications  Medication Sig Start Date End Date Taking? Authorizing Provider  cetirizine  (ZYRTEC ) 10 MG tablet Take 1 tablet (10 mg total) by mouth daily. 01/14/24   Johnson, Megan P, DO  cyclobenzaprine  (FLEXERIL ) 10 MG tablet Take 1 tablet (10 mg total) by mouth at bedtime as needed for muscle spasms. 01/14/24   Johnson, Megan P, DO  diclofenac  Sodium (VOLTAREN ) 1 % GEL Apply 4 g topically 4 (four) times daily. 01/14/24   Johnson, Megan P, DO  diphenhydrAMINE  (BENADRYL  ALLERGY) 25 mg capsule Take 1 capsule (25 mg total) by mouth every 6 (six) hours as needed. 12/18/22   Herold Hadassah SQUIBB, MD  hydrocortisone  (ANUSOL -HC) 2.5 % rectal cream Place 1 Application rectally 2 (two) times daily. 12/31/23   Herold Hadassah SQUIBB, MD  ketoconazole  (NIZORAL ) 2 % shampoo Apply 2 to 3 times per week, massage into scalp and leave in for 5 minutes before rinsing out 08/12/23   Hester Alm BROCKS, MD  Magnesium  Glycinate 100 MG CAPS Take 4 capsules by mouth nightly for headaches, sleep and anxiety. 04/09/23     montelukast  (SINGULAIR ) 10 MG tablet Take 1 tablet (10 mg total) by mouth at bedtime. PRN  01/14/24   Johnson, Megan P, DO  nortriptyline  (PAMELOR ) 25 MG capsule Take 1 capsule (25 mg total) by mouth at bedtime. PRN 01/14/24   Vicci Bouchard P, DO  omeprazole  (PRILOSEC) 40 MG capsule Take 1 capsule (40 mg total) by mouth daily as needed. 03/14/24   Johnson, Megan P, DO  ondansetron  (ZOFRAN ) 4 MG tablet Take 1 tablet (4 mg total) by mouth every 8 (eight) hours as needed for nausea or vomiting. 06/03/23   Herold Hadassah SQUIBB, MD  Riboflavin  (B-2) 100 MG TABS Take 400 mg (4 tablets) by mouth daily for 90 days for headaches. 05/14/23      Rimegepant Sulfate  (NURTEC) 75 MG TBDP Dissolve 1 tablet (75 mg total) by mouth daily as needed. 12/31/23   Herold Hadassah SQUIBB, MD  SUMAtriptan  (IMITREX ) 25 MG tablet Take 1 tablet (25 mg total) by mouth once daily as needed for migraine. May repeat in 2 hours if headache persists or recurs. (Max 2 tablets per 24 hours) 01/14/24   Vicci Bouchard P, DO  triamcinolone  ointment (KENALOG ) 0.5 % Apply 1 Application topically 2 (two) times daily. 12/18/22   Herold Hadassah SQUIBB, MD  valACYclovir  (VALTREX ) 500 MG tablet Take 1 tablet (500 mg total) by mouth 2 (two) times daily as needed 12/22/23   Vicci Bouchard P, DO  albuterol  (VENTOLIN  HFA) 108 (90 Base) MCG/ACT inhaler Inhale 2 puffs into the lungs every 6 (six) hours as needed for wheezing or shortness of breath. Patient not taking: Reported on 07/03/2020 05/11/20 07/03/20  Vicci Bouchard SQUIBB, DO    Allergies: Allergies[1]  Review of Systems: Review of Systems  Constitutional:  Negative for chills and fever.  Respiratory:  Negative for shortness of breath.   Cardiovascular:  Negative for chest pain.  Gastrointestinal:  Positive for abdominal pain and diarrhea. Negative for nausea and vomiting.    Physical Exam BP 102/70   Pulse 98   Ht 5' 4 (1.626 m)   Wt 161 lb (73 kg)   SpO2 98%   BMI 27.64 kg/m  CONSTITUTIONAL: No acute distress, well-nourished HEENT:  Normocephalic, atraumatic, extraocular motion intact. RESPIRATORY:  Lungs are clear, and breath sounds are equal bilaterally. Normal respiratory effort without pathologic use of accessory muscles. CARDIOVASCULAR: Heart is regular without murmurs, gallops, or rubs. GI: The abdomen is soft, nondistended, currently nontender to palpation.  Prior incisions are well-healed.   NEUROLOGIC:  Motor and sensation is grossly normal.  Cranial nerves are grossly intact. PSYCH:  Alert and oriented to person, place and time. Affect is normal.  Labs/Imaging: HIDA scan on 03/30/2024: FINDINGS: Prompt uptake and  biliary excretion of activity by the liver is seen. Gallbladder activity is visualized, consistent with patency of cystic duct. Biliary activity passes into small bowel, consistent with patent common bile duct.   Calculated gallbladder ejection fraction is 98%. (Normal gallbladder ejection fraction with Ensure is greater than 33% and less than 80%.)   IMPRESSION: 1.  Patent cystic and common bile ducts. 2.  Elevated gallbladder ejection fraction as can be seen with gallbladder hyperkinesia.  Assessment and Plan: This is a 54 y.o. female with biliary hyperkinesia.  - Discussed with the patient findings on her HIDA scan showing an ejection fraction of 98%.  Discussed with her how this can result in her symptoms of pain associated with diarrhea and bloatedness.  Again I do not think that her polyps are the reason for her symptoms but the HIDA scan does give us  more objective evidence to support her  symptoms.  As such, I do believe that proceeding with cholecystectomy will be helpful.  She is in agreement. - Discussed with the patient and the plan for robotic cholecystectomy and reviewed the surgery at length with her including the planned incisions, risks of bleeding, infection, injury to surrounding structures, the use of ICG to better evaluate the biliary anatomy, that this would be an outpatient surgery, postoperative activity restrictions, pain control, and she is willing to proceed. - We will schedule the patient for surgery on 04/19/2024.  All of her questions have been answered.  I spent 30 minutes dedicated to the care of this patient on the date of this encounter to include pre-visit review of records, face-to-face time with the patient discussing diagnosis and management, and any post-visit coordination of care.   Aloysius Sheree Plant, MD [facility]        [1]  Allergies Allergen Reactions   Codeine Other (See Comments)   Pollen Extract    "

## 2024-04-10 ENCOUNTER — Other Ambulatory Visit: Payer: Self-pay

## 2024-04-10 MED ORDER — OSELTAMIVIR PHOSPHATE 75 MG PO CAPS
ORAL_CAPSULE | ORAL | 0 refills | Status: AC
  Filled 2024-04-10: qty 10, 5d supply, fill #0

## 2024-04-10 MED ORDER — ONDANSETRON 4 MG PO TBDP
ORAL_TABLET | ORAL | 0 refills | Status: AC
  Filled 2024-04-10: qty 30, 5d supply, fill #0

## 2024-04-11 ENCOUNTER — Ambulatory Visit: Payer: Self-pay

## 2024-04-11 ENCOUNTER — Telehealth: Payer: Self-pay

## 2024-04-11 NOTE — Telephone Encounter (Signed)
 Patient has been advised of Pre-Admission date/time, and Surgery date at [facility].  Surgery Date: 04/19/24 Preadmission Testing Date: 04/13/24 (phone 8A-1P)  Patient has been made aware to call [phone], between 1-3:00pm the day before surgery, to find out what time to arrive for surgery.

## 2024-04-11 NOTE — Telephone Encounter (Signed)
 FYI Only or Action Required?: FYI only for provider: Patient calling in with symptoms after less than 24 hours of abx use.  Patient was last seen in primary care on 03/14/2024 by Vicci Duwaine SQUIBB, DO.  Called Nurse Triage reporting Influenza.  Symptoms began several days ago.  Interventions attempted: Prescription medications: abx.  Symptoms are: unchanged.  Triage Disposition: Home Care  Patient/caregiver understands and will follow disposition?: Yes  Reason for Disposition  [1] Influenza diagnosed by doctor (or NP/PA) AND [2] no complications  Answer Assessment - Initial Assessment Questions Onset 04/08/24, throat hurts, whole body hurts, tested positive for the flu at [facility] 04/10/24. Started abx 04/10/24 and patient states she is not getting better.  Writer advised the patient to continue with abx regimen and call us  once it is completed if pt is still experiencing symptoms.    1. DIAGNOSIS CONFIRMATION: When was the influenza diagnosed? By whom? Did you get a test for it?     04/10/24 [facility] 2. INFLUENZA MEDICINES: Were you prescribed any medicines for the influenza?  (e.g., zanamivir [Relenza], oseltamivir  [Tamiflu ]).      Yes  3. SYMPTOMS: What is your main symptom or concern? (e.g., cough, fever, shortness of breath, muscle aches)     Muscle aches  4. ONSET: When did the symptoms start?      04/08/24 5. COUGH: Do you have a cough? If Yes, ask: How bad is the cough?       N/a 6. FEVER: Do you have a fever? If Yes, ask: What is your temperature, how was it measured, and when did it start?     N/a 7. BREATHING DIFFICULTY: Are you having any difficulty breathing? (e.g., normal; shortness of breath, wheezing, unable to speak)      Denies  9. HIGH RISK FOR COMPLICATIONS: Do you have any chronic medical problems? (e.g., asthma, heart or lung disease, obesity, weak immune system)     N/a  Protocols used: Influenza (Flu) Follow-up Call-A-AH  Copied from CRM #[id number].  Topic: Clinical - Red Word Triage >> Apr 11, 2024 11:52 AM Antwanette L wrote: Red Word that prompted transfer to Nurse Triage: The patient has been sick since Friday and is experiencing a dry cough, body aches, headache, and loss of appetite. The patient was seen at urgent care yesterday and was told she has the flu

## 2024-04-12 ENCOUNTER — Telehealth: Payer: Self-pay | Admitting: Surgery

## 2024-04-12 NOTE — Telephone Encounter (Signed)
 Patient calls to cancel surgery for 04/19/24. Said she just has been sick, not feeling well and didn't want to go through with surgery at this time. Offered to reschedule, patient declined. Said she will call us  back.

## 2024-04-13 ENCOUNTER — Inpatient Hospital Stay: Admission: RE | Admit: 2024-04-13 | Source: Ambulatory Visit

## 2024-04-14 ENCOUNTER — Ambulatory Visit: Admitting: Family Medicine

## 2024-04-14 ENCOUNTER — Encounter: Payer: Self-pay | Admitting: Family Medicine

## 2024-04-14 ENCOUNTER — Other Ambulatory Visit: Payer: Self-pay

## 2024-04-14 VITALS — BP 96/66 | HR 89 | Temp 97.9°F | Resp 18 | Ht 64.02 in | Wt 159.4 lb

## 2024-04-14 DIAGNOSIS — J101 Influenza due to other identified influenza virus with other respiratory manifestations: Secondary | ICD-10-CM

## 2024-04-14 MED ORDER — METHYLPREDNISOLONE SODIUM SUCC 40 MG IJ SOLR
40.0000 mg | Freq: Once | INTRAMUSCULAR | Status: AC
  Administered 2024-04-14: 40 mg via INTRAMUSCULAR

## 2024-04-14 MED ORDER — BENZONATATE 200 MG PO CAPS
200.0000 mg | ORAL_CAPSULE | Freq: Two times a day (BID) | ORAL | 0 refills | Status: AC | PRN
  Filled 2024-04-14: qty 20, 10d supply, fill #0

## 2024-04-14 MED ORDER — HYDROCOD POLI-CHLORPHE POLI ER 10-8 MG/5ML PO SUER
5.0000 mL | Freq: Two times a day (BID) | ORAL | 0 refills | Status: AC | PRN
  Filled 2024-04-14: qty 60, 6d supply, fill #0

## 2024-04-14 MED ORDER — PREDNISONE 10 MG PO TABS
ORAL_TABLET | ORAL | 0 refills | Status: AC
  Filled 2024-04-14: qty 21, 6d supply, fill #0

## 2024-04-14 NOTE — Progress Notes (Signed)
 "  BP 96/66 (BP Location: Left Arm, Patient Position: Sitting, Cuff Size: Normal)   Pulse 89   Temp 97.9 F (36.6 C) (Oral)   Resp 18   Ht 5' 4.02 (1.626 m)   Wt 159 lb 6.4 oz (72.3 kg)   SpO2 97%   BMI 27.35 kg/m    Subjective:    Patient ID: Karen Leala GAILS., female    DOB: July 09, 1970, 54 y.o.   MRN: [id number]  HPI: Karen Walker is a 54 y.o. female  Chief Complaint  Patient presents with   Influenza   UPPER RESPIRATORY TRACT INFECTION Duration: about a week Worst symptom: fatigue, cough Fever: yes Cough: yes Shortness of breath: no Wheezing: no Chest pain: no Chest tightness: yes Chest congestion: yes Nasal congestion: yes Runny nose: no Post nasal drip: yes Sneezing: no Sore throat: yes Swollen glands: no Sinus pressure: yes Headache: yes Face pain: yes Toothache: no Ear pain: no  Ear pressure: no  Eyes red/itching:no Eye drainage/crusting: no  Vomiting: no Rash: no Fatigue: yes Sick contacts: yes Strep contacts: no  Context: stable Recurrent sinusitis: no Relief with OTC cold/cough medications: no  Treatments attempted: tamiflu     Relevant past medical, surgical, family and social history reviewed and updated as indicated. Interim medical history since our last visit reviewed. Allergies and medications reviewed and updated.  Review of Systems  Constitutional:  Positive for chills, diaphoresis, fatigue and fever. Negative for activity change, appetite change and unexpected weight change.  HENT:  Positive for congestion. Negative for dental problem, drooling, ear discharge, ear pain, facial swelling, hearing loss, mouth sores, nosebleeds, postnasal drip, rhinorrhea, sinus pressure, sinus pain, sneezing, sore throat, tinnitus, trouble swallowing and voice change.   Eyes: Negative.   Respiratory:  Positive for cough. Negative for apnea, choking, chest tightness, shortness of breath, wheezing and stridor.   Cardiovascular: Negative.    Musculoskeletal: Negative.   Psychiatric/Behavioral: Negative.      Per HPI unless specifically indicated above     Objective:    BP 96/66 (BP Location: Left Arm, Patient Position: Sitting, Cuff Size: Normal)   Pulse 89   Temp 97.9 F (36.6 C) (Oral)   Resp 18   Ht 5' 4.02 (1.626 m)   Wt 159 lb 6.4 oz (72.3 kg)   SpO2 97%   BMI 27.35 kg/m   Wt Readings from Last 3 Encounters:  04/14/24 159 lb 6.4 oz (72.3 kg)  04/08/24 161 lb (73 kg)  03/23/24 160 lb (72.6 kg)    Physical Exam Vitals and nursing note reviewed.  Constitutional:      General: She is not in acute distress.    Appearance: Normal appearance. She is well-developed and normal weight. She is ill-appearing. She is not toxic-appearing or diaphoretic.  HENT:     Head: Normocephalic and atraumatic.     Right Ear: External ear normal.     Left Ear: External ear normal.     Nose: Nose normal.     Mouth/Throat:     Mouth: Mucous membranes are moist.     Pharynx: Oropharynx is clear.  Eyes:     General: No scleral icterus.       Right eye: No discharge.        Left eye: No discharge.     Extraocular Movements: Extraocular movements intact.     Conjunctiva/sclera: Conjunctivae normal.     Pupils: Pupils are equal, round, and reactive to light.  Cardiovascular:     Rate and  Rhythm: Normal rate and regular rhythm.     Pulses: Normal pulses.     Heart sounds: Normal heart sounds. No murmur heard.    No friction rub. No gallop.  Pulmonary:     Effort: Pulmonary effort is normal. No tachypnea, accessory muscle usage or respiratory distress.     Breath sounds: Normal breath sounds. No stridor. No decreased breath sounds, wheezing, rhonchi or rales.  Chest:     Chest wall: No tenderness.  Musculoskeletal:        General: Normal range of motion.     Cervical back: Normal range of motion and neck supple.  Skin:    General: Skin is warm and dry.     Capillary Refill: Capillary refill takes less than 2 seconds.      Coloration: Skin is not jaundiced or pale.     Findings: No bruising, erythema, lesion or rash.  Neurological:     General: No focal deficit present.     Mental Status: She is alert and oriented to person, place, and time. Mental status is at baseline.  Psychiatric:        Mood and Affect: Mood normal.        Behavior: Behavior normal.        Thought Content: Thought content normal.        Judgment: Judgment normal.     Results for orders placed or performed in visit on 03/14/24  Comp Met (CMET)   Collection Time: 03/14/24 11:15 AM  Result Value Ref Range   Glucose 79 70 - 99 mg/dL   BUN 12 6 - 24 mg/dL   Creatinine, Ser 9.21 0.57 - 1.00 mg/dL   eGFR 90 >40 fO/fpw/8.26   BUN/Creatinine Ratio 15 9 - 23   Sodium 143 134 - 144 mmol/L   Potassium 4.0 3.5 - 5.2 mmol/L   Chloride 106 96 - 106 mmol/L   CO2 25 20 - 29 mmol/L   Calcium 9.2 8.7 - 10.2 mg/dL   Total Protein 6.7 6.0 - 8.5 g/dL   Albumin 4.2 3.8 - 4.9 g/dL   Globulin, Total 2.5 1.5 - 4.5 g/dL   Bilirubin Total 0.4 0.0 - 1.2 mg/dL   Alkaline Phosphatase 62 49 - 135 IU/L   AST 20 0 - 40 IU/L   ALT 27 0 - 32 IU/L  Amylase   Collection Time: 03/14/24 11:15 AM  Result Value Ref Range   Amylase 116 (H) 31 - 110 U/L  Lipase   Collection Time: 03/14/24 11:15 AM  Result Value Ref Range   Lipase 40 14 - 72 U/L  CBC with Differential/Platelet   Collection Time: 03/14/24 11:15 AM  Result Value Ref Range   WBC 4.5 3.4 - 10.8 x10E3/uL   RBC 4.34 3.77 - 5.28 x10E6/uL   Hemoglobin 12.9 11.1 - 15.9 g/dL   Hematocrit 59.5 65.9 - 46.6 %   MCV 93 79 - 97 fL   MCH 29.7 26.6 - 33.0 pg   MCHC 31.9 31.5 - 35.7 g/dL   RDW 87.3 88.2 - 84.5 %   Platelets 209 150 - 450 x10E3/uL   Neutrophils 51 Not Estab. %   Lymphs 38 Not Estab. %   Monocytes 7 Not Estab. %   Eos 3 Not Estab. %   Basos 1 Not Estab. %   Neutrophils Absolute 2.3 1.4 - 7.0 x10E3/uL   Lymphocytes Absolute 1.7 0.7 - 3.1 x10E3/uL   Monocytes Absolute 0.3 0.1 - 0.9  x10E3/uL  EOS (ABSOLUTE) 0.2 0.0 - 0.4 x10E3/uL   Basophils Absolute 0.0 0.0 - 0.2 x10E3/uL   Immature Granulocytes 0 Not Estab. %   Immature Grans (Abs) 0.0 0.0 - 0.1 x10E3/uL      Assessment & Plan:   Problem List Items Addressed This Visit   None Visit Diagnoses       Influenza A    -  Primary   Has finished tamiflu . Will treat with prednisone  and tessalon  and tussionex. Out of work. Call with any concerns or if not getting better.   Relevant Medications   methylPREDNISolone  sodium succinate (SOLU-MEDROL ) 40 mg/mL injection 40 mg        Follow up plan: Return in about 6 weeks (around 05/26/2024).      "

## 2024-04-19 ENCOUNTER — Encounter: Admission: RE | Payer: Self-pay

## 2024-04-19 ENCOUNTER — Ambulatory Visit: Admission: RE | Admit: 2024-04-19 | Admitting: Surgery

## 2024-04-19 SURGERY — CHOLECYSTECTOMY, ROBOT-ASSISTED, LAPAROSCOPIC
Anesthesia: General

## 2024-04-26 ENCOUNTER — Other Ambulatory Visit: Payer: Self-pay

## 2024-05-03 ENCOUNTER — Other Ambulatory Visit: Payer: Self-pay

## 2024-05-03 MED ORDER — NA SULFATE-K SULFATE-MG SULF 17.5-3.13-1.6 GM/177ML PO SOLN
ORAL | 0 refills | Status: AC
  Filled 2024-05-03: qty 354, 1d supply, fill #0

## 2024-05-11 ENCOUNTER — Ambulatory Visit: Admitting: Registered Nurse

## 2024-05-11 ENCOUNTER — Ambulatory Visit
Admission: RE | Admit: 2024-05-11 | Discharge: 2024-05-11 | Disposition: A | Source: Home / Self Care | Attending: Gastroenterology | Admitting: Gastroenterology

## 2024-05-11 ENCOUNTER — Encounter: Payer: Self-pay | Admitting: Gastroenterology

## 2024-05-11 ENCOUNTER — Other Ambulatory Visit: Payer: Self-pay

## 2024-05-11 ENCOUNTER — Encounter
Admission: RE | Disposition: A | Discharge status: Home / Self Care | Payer: Self-pay | Source: Home / Self Care | Attending: Gastroenterology

## 2024-05-11 MED ORDER — SODIUM CHLORIDE 0.9 % IV SOLN: INTRAVENOUS | Status: DC

## 2024-05-11 MED ORDER — PROPOFOL 10 MG/ML IV BOLUS
INTRAVENOUS | Status: DC | PRN
  Administered 2024-05-11: 100 mg via INTRAVENOUS

## 2024-05-11 MED ORDER — PROPOFOL 1000 MG/100ML IV EMUL
INTRAVENOUS | Status: AC
  Filled 2024-05-11: qty 100

## 2024-05-11 MED ORDER — PROPOFOL 500 MG/50ML IV EMUL
INTRAVENOUS | Status: DC | PRN
  Administered 2024-05-11: 150 ug/kg/min via INTRAVENOUS

## 2024-05-11 MED ORDER — LIDOCAINE HCL (CARDIAC) PF 100 MG/5ML IV SOSY
PREFILLED_SYRINGE | INTRAVENOUS | Status: DC | PRN
  Administered 2024-05-11: 40 mg via INTRAVENOUS

## 2024-05-11 NOTE — Anesthesia Postprocedure Evaluation (Signed)
"   Anesthesia Post Note  Patient: Karen Walker  Procedure(s) Performed: COLONOSCOPY  Patient location during evaluation: PACU Anesthesia Type: General Level of consciousness: awake and alert Pain management: pain level controlled Vital Signs Assessment: post-procedure vital signs reviewed and stable Respiratory status: spontaneous breathing, nonlabored ventilation, respiratory function stable and patient connected to nasal cannula oxygen Cardiovascular status: blood pressure returned to baseline and stable Postop Assessment: no apparent nausea or vomiting Anesthetic complications: no   No notable events documented.   Last Vitals:  Vitals:   05/11/24 0908  BP: 104/68  Pulse: 78  Resp: 18  Temp: (!) 36 C  SpO2: 99%    Last Pain:  Vitals:   05/11/24 0908  TempSrc: Temporal  PainSc: 0-No pain                 Karen Walker      "

## 2024-05-11 NOTE — Anesthesia Procedure Notes (Signed)
 Date/Time: 05/11/2024 10:01 AM  Performed by: Tod Handing, CRNAPre-anesthesia Checklist: Patient identified, Emergency Drugs available, Suction available and Patient being monitored Patient Re-evaluated:Patient Re-evaluated prior to induction Oxygen Delivery Method: Nasal cannula Induction Type: IV induction Dental Injury: Teeth and Oropharynx as per pre-operative assessment  Comments: Nasal cannula with etCO2 monitoring

## 2024-05-11 NOTE — Transfer of Care (Signed)
 Immediate Anesthesia Transfer of Care Note  Patient: Karen Walker  Procedure(s) Performed: Procedures: COLONOSCOPY (N/A)  Patient Location: PACU and Endoscopy Unit  Anesthesia Type:General  Level of Consciousness: sedated  Airway & Oxygen Therapy: Patient Spontanous Breathing and Patient connected to nasal cannula oxygen  Post-op Assessment: Report given to RN and Post -op Vital signs reviewed and stable  Post vital signs: Reviewed and stable  Last Vitals:  Vitals:   05/11/24 0908 05/11/24 1024  BP: 104/68 (!) 81/56  Pulse: 78   Resp: 18   Temp: (!) 36 C   SpO2: 99%     Complications: No apparent anesthesia complications

## 2024-05-11 NOTE — H&P (Signed)
 "                                                                                                                           Ruel Kung , MD [address], [address], [place], [place], [address] Phone: [phone] Fax: [phone]  Primary Care Physician:  Vicci Duwaine SQUIBB, DO   Pre-Procedure History & Physical: HPI:  Karen Walker is a 54 y.o. female is here for an colonoscopy.   Past Medical History:  Diagnosis Date   Allergic rhinitis    Anxiety    Breast lump on right side at 8 o'clock position 12/06/2014   Depression    Dysplastic nevus 02/14/2020   Mod to severe - close to margin R foot inf to her med maleolus   Dysplastic nevus 08/07/2022   L middle medical calf - moderate to severe, recheck at next visit   History of skin cancer    Hx of migraine headaches    light sensivity, unilateral HA   Plantar fasciitis 09/06/2019   Urticaria     Past Surgical History:  Procedure Laterality Date   BREAST CYST ASPIRATION Right 2013   negative. Done at Dr. Fredirick office   COLONOSCOPY WITH PROPOFOL  N/A 07/20/2020   Procedure: COLONOSCOPY WITH PROPOFOL ;  Surgeon: Janalyn Keene NOVAK, MD;  Location: [facility] ENDOSCOPY;  Service: Endoscopy;  Laterality: N/A;  SPANISH INTERPRETER   ESOPHAGOGASTRODUODENOSCOPY (EGD) WITH PROPOFOL  N/A 07/20/2020   Procedure: ESOPHAGOGASTRODUODENOSCOPY (EGD) WITH PROPOFOL ;  Surgeon: Janalyn Keene NOVAK, MD;  Location: [facility] ENDOSCOPY;  Service: Endoscopy;  Laterality: N/A;   HERNIA REPAIR  2014   hysterectemy     melonoma removal on R medial arch of foot  2008   TOTAL ABDOMINAL HYSTERECTOMY W/ BILATERAL SALPINGOOPHORECTOMY  2014   TOTAL ABDOMINAL HYSTERECTOMY W/ BILATERAL SALPINGOOPHORECTOMY     UMBILICAL HERNIA REPAIR     occurred with hysterectemy    Prior to Admission medications  Medication Sig Start Date End Date Taking? Authorizing Provider  benzonatate  (TESSALON ) 200 MG capsule Take 1 capsule (200 mg total) by mouth 2 (two) times daily as  needed for cough. 04/14/24   Johnson, Megan P, DO  cetirizine  (ZYRTEC ) 10 MG tablet Take 1 tablet (10 mg total) by mouth daily. 01/14/24   Johnson, Megan P, DO  chlorpheniramine-HYDROcodone (TUSSIONEX) 10-8 MG/5ML Take 5 mLs by mouth every 12 (twelve) hours as needed for cough. 04/14/24   Johnson, Megan P, DO  cyclobenzaprine  (FLEXERIL ) 10 MG tablet Take 1 tablet (10 mg total) by mouth at bedtime as needed for muscle spasms. 01/14/24   Johnson, Megan P, DO  diclofenac  Sodium (VOLTAREN ) 1 % GEL Apply 4 g topically 4 (four) times daily. 01/14/24   Johnson, Megan P, DO  diphenhydrAMINE  (BENADRYL  ALLERGY) 25 mg capsule Take 1 capsule (25 mg total) by mouth every 6 (six) hours as needed. 12/18/22   Herold Hadassah SQUIBB, MD  hydrocortisone  (ANUSOL -HC) 2.5 % rectal cream Place 1 Application rectally 2 (  two) times daily. 12/31/23   Herold Hadassah SQUIBB, MD  ketoconazole  (NIZORAL ) 2 % shampoo Apply 2 to 3 times per week, massage into scalp and leave in for 5 minutes before rinsing out 08/12/23   Hester Alm BROCKS, MD  Magnesium  Glycinate 100 MG CAPS Take 4 capsules by mouth nightly for headaches, sleep and anxiety. 04/09/23     montelukast  (SINGULAIR ) 10 MG tablet Take 1 tablet (10 mg total) by mouth at bedtime. PRN 01/14/24   Johnson, Megan P, DO  Na Sulfate-K Sulfate-Mg Sulfate concentrate (SUPREP) 17.5-3.13-1.6 GM/177ML SOLN Take 1 Bottle by mouth as directed One kit contains 2 bottles.  Take both bottles at the times instructed by your provider. 05/03/24     nortriptyline  (PAMELOR ) 25 MG capsule Take 1 capsule (25 mg total) by mouth at bedtime. PRN 01/14/24   Johnson, Megan P, DO  omeprazole  (PRILOSEC) 40 MG capsule Take 1 capsule (40 mg total) by mouth daily as needed. 03/14/24   Vicci Bouchard P, DO  ondansetron  (ZOFRAN ) 4 MG tablet Take 1 tablet (4 mg total) by mouth every 8 (eight) hours as needed for nausea or vomiting. 06/03/23   Herold Hadassah SQUIBB, MD  ondansetron  (ZOFRAN -ODT) 4 MG disintegrating tablet Take 2 tablets (8 mg total)  by mouth every 8 (eight) hours if needed for nausea or vomiting for up to 5 days. 04/10/24     oseltamivir  (TAMIFLU ) 75 MG capsule Take 1 capsule (75 mg total) by mouth in the morning and 1 capsule (75 mg total) in the evening. Do all this for 5 days. 04/10/24     Riboflavin  (B-2) 100 MG TABS Take 400 mg (4 tablets) by mouth daily for 90 days for headaches. 05/14/23     Rimegepant Sulfate  (NURTEC) 75 MG TBDP Dissolve 1 tablet (75 mg total) by mouth daily as needed. 12/31/23   Herold Hadassah SQUIBB, MD  SUMAtriptan  (IMITREX ) 25 MG tablet Take 1 tablet (25 mg total) by mouth once daily as needed for migraine. May repeat in 2 hours if headache persists or recurs. (Max 2 tablets per 24 hours) 01/14/24   Vicci Bouchard P, DO  triamcinolone  ointment (KENALOG ) 0.5 % Apply 1 Application topically 2 (two) times daily. 12/18/22   Herold Hadassah SQUIBB, MD  valACYclovir  (VALTREX ) 500 MG tablet Take 1 tablet (500 mg total) by mouth 2 (two) times daily as needed 12/22/23   Vicci Bouchard P, DO  albuterol  (VENTOLIN  HFA) 108 (90 Base) MCG/ACT inhaler Inhale 2 puffs into the lungs every 6 (six) hours as needed for wheezing or shortness of breath. Patient not taking: Reported on 07/03/2020 05/11/20 07/03/20  Vicci Bouchard P, DO    Allergies as of 05/04/2024 - Review Complete 04/14/2024  Allergen Reaction Noted   Codeine Other (See Comments) 08/11/2014   Pollen extract  08/11/2014    Family History  Problem Relation Age of Onset   Stroke Father    Asthma Daughter    Urticaria Son    Food Allergy Son        red dye, dairy   Allergic rhinitis Neg Hx    Angioedema Neg Hx    Eczema Neg Hx     Social History   Socioeconomic History   Marital status: Married    Spouse name: Not on file   Number of children: Not on file   Years of education: Not on file   Highest education level: Not on file  Occupational History   Not on file  Tobacco Use  Smoking status: Never   Smokeless tobacco: Never  Vaping Use   Vaping status:  Never Used  Substance and Sexual Activity   Alcohol use: No   Drug use: No   Sexual activity: Yes    Birth control/protection: Surgical  Other Topics Concern   Not on file  Social History Narrative   Not on file   Social Drivers of Health   Tobacco Use: Low Risk (04/14/2024)   Patient History    Smoking Tobacco Use: Never    Smokeless Tobacco Use: Never    Passive Exposure: Not on file  Financial Resource Strain: Low Risk  (05/04/2024)   Received from [facility]   Overall Financial Resource Strain (CARDIA)    Difficulty of Paying Living Expenses: Not hard at all  Food Insecurity: No Food Insecurity (05/04/2024)   Received from [facility]   Epic    Within the past 12 months, you worried that your food would run out before you got the money to buy more.: Never true    Within the past 12 months, the food you bought just didn't last and you didn't have money to get more.: Never true  Transportation Needs: No Transportation Needs (05/04/2024)   Received from [facility] - [facility]    In the past 12 months, has lack of transportation kept you from medical appointments or from getting medications?: No    Lack of Transportation (Non-Medical): No  Physical Activity: Not on file  Stress: Not on file  Social Connections: Not on file  Intimate Partner Violence: Not on file  Depression (PHQ2-9): Low Risk (02/16/2024)   Depression (PHQ2-9)    PHQ-2 Score: 0  Alcohol Screen: Not on file  Housing: Low Risk  (05/04/2024)   Received from [facility]   Epic    In the last 12 months, was there a time when you were not able to pay the mortgage or rent on time?: No    In the past 12 months, how many times have you moved where you were living?: 0    At any time in the past 12 months, were you homeless or living in a shelter (including now)?: No  Utilities: Not At Risk (05/04/2024)   Received from [facility]   Epic    In the past 12 months has the electric, gas, oil, or water company threatened to shut off services in your home?: No  Health Literacy: Not on file    Review of Systems: See HPI, otherwise negative ROS  Physical Exam: There were no vitals taken for this visit. General:   Alert,  pleasant and cooperative in NAD Head:  Normocephalic and atraumatic. Neck:  Supple; no masses or thyromegaly. Lungs:  Clear throughout to auscultation, normal respiratory effort.    Heart:  +S1, +S2, Regular rate and rhythm, No edema. Abdomen:  Soft, nontender and nondistended. Normal bowel sounds, without guarding, and without rebound.   Neurologic:  Alert and  oriented x4;  grossly normal neurologically.  Impression/Plan: Hadassah Chough V. is here for an colonoscopy to be performed for rectal bleeding  Risks, benefits, limitations, and alternatives regarding  colonoscopy have been reviewed with the patient.  Questions have been answered.  All parties agreeable.   Ruel Kung, MD  05/11/2024, 8:55 AM  "

## 2024-05-11 NOTE — Anesthesia Preprocedure Evaluation (Signed)
 "                                  Anesthesia Evaluation  Patient identified by MRN, date of birth, ID band Patient awake    Reviewed: Allergy & Precautions, H&P , NPO status , Patient's Chart, lab work & pertinent test results, reviewed documented beta blocker date and time   Airway Mallampati: II   Neck ROM: full    Dental  (+) Poor Dentition   Pulmonary neg pulmonary ROS   Pulmonary exam normal        Cardiovascular negative cardio ROS Normal cardiovascular exam Rhythm:regular Rate:Normal     Neuro/Psych  Headaches  negative psych ROS   GI/Hepatic negative GI ROS, Neg liver ROS,,,  Endo/Other  negative endocrine ROS    Renal/GU negative Renal ROS  negative genitourinary   Musculoskeletal   Abdominal   Peds  Hematology negative hematology ROS (+)   Anesthesia Other Findings Past Medical History: No date: Allergic rhinitis No date: Anxiety 12/06/2014: Breast lump on right side at 8 o'clock position No date: Depression 02/14/2020: Dysplastic nevus     Comment:  Mod to severe - close to margin R foot inf to her med               maleolus 08/07/2022: Dysplastic nevus     Comment:  L middle medical calf - moderate to severe, recheck at               next visit No date: History of skin cancer No date: Hx of migraine headaches     Comment:  light sensivity, unilateral HA 09/06/2019: Plantar fasciitis No date: Urticaria Past Surgical History: 2013: BREAST CYST ASPIRATION; Right     Comment:  negative. Done at Dr. Fredirick office 07/20/2020: COLONOSCOPY WITH PROPOFOL ; N/A     Comment:  Procedure: COLONOSCOPY WITH PROPOFOL ;  Surgeon:               Janalyn Keene NOVAK, MD;  Location: [facility] ENDOSCOPY;                Service: Endoscopy;  Laterality: N/A;  SPANISH               INTERPRETER 07/20/2020: ESOPHAGOGASTRODUODENOSCOPY (EGD) WITH PROPOFOL ; N/A     Comment:  Procedure: ESOPHAGOGASTRODUODENOSCOPY (EGD) WITH               PROPOFOL ;  Surgeon:  Janalyn Keene NOVAK, MD;  Location:               [facility] ENDOSCOPY;  Service: Endoscopy;  Laterality: N/A; 2014: HERNIA REPAIR No date: hysterectemy 2008: melonoma removal on R medial arch of foot 2014: TOTAL ABDOMINAL HYSTERECTOMY W/ BILATERAL SALPINGOOPHORECTOMY No date: TOTAL ABDOMINAL HYSTERECTOMY W/ BILATERAL SALPINGOOPHORECTOMY No date: UMBILICAL HERNIA REPAIR     Comment:  occurred with hysterectemy BMI    Body Mass Index: 26.43 kg/m     Reproductive/Obstetrics negative OB ROS                              Anesthesia Physical Anesthesia Plan  ASA: 2  Anesthesia Plan: General   Post-op Pain Management:    Induction:   PONV Risk Score and Plan:   Airway Management Planned:   Additional Equipment:   Intra-op Plan:   Post-operative Plan:   Informed Consent: I have reviewed the patients  History and Physical, chart, labs and discussed the procedure including the risks, benefits and alternatives for the proposed anesthesia with the patient or authorized representative who has indicated his/her understanding and acceptance.     Dental Advisory Given  Plan Discussed with: CRNA  Anesthesia Plan Comments:         Anesthesia Quick Evaluation  "

## 2024-05-11 NOTE — Op Note (Signed)
 [facility]astroenterology Patient Name: Karen Walker Procedure Date: 05/11/2024 9:44 AM MRN: [id number] Account #: [id number] Date of Birth: Jun 21, 1970 Admit Type: Outpatient Age: 54 Room: [facility] ENDO ROOM 1 Gender: Female Note Status: Finalized Instrument Name: Colon Scope [id number] Procedure:             Colonoscopy Indications:           Rectal bleeding Providers:             Ruel Kung MD, MD Referring MD:          [name] (Referring MD) Medicines:             Monitored Anesthesia Care Complications:         No immediate complications. Procedure:             Pre-Anesthesia Assessment:                        - Prior to the procedure, a History and Physical was                         performed, and patient medications, allergies and                         sensitivities were reviewed. The patient's tolerance                         of previous anesthesia was reviewed.                        - The risks and benefits of the procedure and the                         sedation options and risks were discussed with the                         patient. All questions were answered and informed                         consent was obtained.                        - ASA Grade Assessment: II - A patient with mild                         systemic disease.                        After obtaining informed consent, the colonoscope was                         passed under direct vision. Throughout the procedure,                         the patient's blood pressure, pulse, and oxygen                         saturations were monitored continuously. The                         Colonoscope was introduced through the  anus and                         advanced to the the cecum, identified by the                         appendiceal orifice. The colonoscopy was performed                         with ease. The patient tolerated the procedure well.                         The  quality of the bowel preparation was excellent.                         The ileocecal valve, appendiceal orifice, and rectum                         were photographed. Findings:      The perianal and digital rectal examinations were normal.      Non-bleeding internal hemorrhoids were found during retroflexion. The       hemorrhoids were medium-sized and Grade I (internal hemorrhoids that do       not prolapse).      The exam was otherwise without abnormality on direct and retroflexion       views. Impression:            - Non-bleeding internal hemorrhoids.                        - The examination was otherwise normal on direct and                         retroflexion views.                        - No specimens collected. Recommendation:        - Discharge patient to home (with escort).                        - Resume previous diet.                        - Continue present medications.                        - Repeat colonoscopy in 10 years for screening                         purposes.                        - Return to GI office in 1 week. Procedure Code(s):     --- Professional ---                        [phone], Colonoscopy, flexible; diagnostic, including                         collection of specimen(s) by brushing or washing, when  performed (separate procedure) Diagnosis Code(s):     --- Professional ---                        K64.0, First degree hemorrhoids                        K62.5, Hemorrhage of anus and rectum CPT copyright 2022 American Medical Association. All rights reserved. The codes documented in this report are preliminary and upon coder review may  be revised to meet current compliance requirements. Ruel Kung, MD Ruel Kung MD, MD 05/11/2024 10:21:20 AM This report has been signed electronically. Number of Addenda: 0 Note Initiated On: 05/11/2024 9:44 AM Scope Withdrawal Time: 0 hours 8 minutes 36 seconds  Total Procedure Duration: 0  hours 12 minutes 23 seconds  Estimated Blood Loss:  Estimated blood loss: none.      [facility]

## 2024-05-18 ENCOUNTER — Ambulatory Visit: Admitting: Surgery

## 2024-05-30 ENCOUNTER — Ambulatory Visit: Admitting: Family Medicine

## 2024-05-31 ENCOUNTER — Ambulatory Visit (INDEPENDENT_AMBULATORY_CARE_PROVIDER_SITE_OTHER): Admitting: Vascular Surgery

## 2024-05-31 ENCOUNTER — Ambulatory Visit (INDEPENDENT_AMBULATORY_CARE_PROVIDER_SITE_OTHER): Admitting: Nurse Practitioner

## 2024-08-17 ENCOUNTER — Ambulatory Visit: Admitting: Dermatology

## 2025-01-16 ENCOUNTER — Encounter: Admitting: Family Medicine
